# Patient Record
Sex: Male | Born: 1965 | Race: Black or African American | Hispanic: No | Marital: Married | Smoking: Never smoker
Health system: Southern US, Community
[De-identification: ages and names within clinical notes are randomized; demographics above are authoritative.]

## PROBLEM LIST (undated history)

## (undated) DIAGNOSIS — D649 Anemia, unspecified: Secondary | ICD-10-CM

## (undated) DIAGNOSIS — I10 Essential (primary) hypertension: Secondary | ICD-10-CM

## (undated) DIAGNOSIS — I509 Heart failure, unspecified: Secondary | ICD-10-CM

## (undated) DIAGNOSIS — N186 End stage renal disease: Secondary | ICD-10-CM

## (undated) DIAGNOSIS — J189 Pneumonia, unspecified organism: Secondary | ICD-10-CM

## (undated) DIAGNOSIS — E11319 Type 2 diabetes mellitus with unspecified diabetic retinopathy without macular edema: Secondary | ICD-10-CM

## (undated) DIAGNOSIS — L409 Psoriasis, unspecified: Secondary | ICD-10-CM

## (undated) DIAGNOSIS — Z992 Dependence on renal dialysis: Secondary | ICD-10-CM

## (undated) DIAGNOSIS — E109 Type 1 diabetes mellitus without complications: Secondary | ICD-10-CM

## (undated) HISTORY — DX: Heart failure, unspecified: I50.9

## (undated) HISTORY — PX: PORTA CATH INSERTION: CATH118285

## (undated) HISTORY — PX: EYE SURGERY: SHX253

## (undated) HISTORY — PX: PERITONEAL CATHETER INSERTION: SHX2223

## (undated) HISTORY — PX: CATARACT EXTRACTION W/ INTRAOCULAR LENS  IMPLANT, BILATERAL: SHX1307

## (undated) HISTORY — PX: RETINAL DETACHMENT SURGERY: SHX105

---

## 1998-04-26 ENCOUNTER — Inpatient Hospital Stay: Admission: AD | Admit: 1998-04-26 | Discharge: 1998-04-28 | Payer: Self-pay | Admitting: Emergency Medicine

## 1998-04-29 ENCOUNTER — Encounter: Admission: RE | Admit: 1998-04-29 | Discharge: 1998-04-29 | Payer: Self-pay | Admitting: Family Medicine

## 1998-05-06 ENCOUNTER — Encounter: Admission: RE | Admit: 1998-05-06 | Discharge: 1998-08-04 | Payer: Self-pay | Admitting: *Deleted

## 2001-09-12 ENCOUNTER — Inpatient Hospital Stay (HOSPITAL_COMMUNITY): Admission: EM | Admit: 2001-09-12 | Discharge: 2001-09-14 | Payer: Self-pay | Admitting: Emergency Medicine

## 2010-10-29 ENCOUNTER — Emergency Department (HOSPITAL_COMMUNITY)
Admission: EM | Admit: 2010-10-29 | Discharge: 2010-10-29 | Disposition: A | Discharge status: Home / Self Care | Payer: 59 | Attending: Emergency Medicine | Admitting: Emergency Medicine

## 2010-10-29 DIAGNOSIS — R5381 Other malaise: Secondary | ICD-10-CM | POA: Insufficient documentation

## 2010-10-29 DIAGNOSIS — Z794 Long term (current) use of insulin: Secondary | ICD-10-CM | POA: Insufficient documentation

## 2010-10-29 DIAGNOSIS — E1169 Type 2 diabetes mellitus with other specified complication: Secondary | ICD-10-CM | POA: Insufficient documentation

## 2010-10-29 LAB — GLUCOSE, CAPILLARY: Glucose-Capillary: 112 mg/dL — ABNORMAL HIGH (ref 70–99)

## 2010-10-30 LAB — GLUCOSE, CAPILLARY
Comment 1: 101
Glucose-Capillary: 179 mg/dL — ABNORMAL HIGH (ref 70–99)

## 2010-11-02 LAB — GLUCOSE, CAPILLARY
Comment 1: 1234
Glucose-Capillary: 41 mg/dL — CL (ref 70–99)

## 2011-04-22 ENCOUNTER — Emergency Department (HOSPITAL_COMMUNITY)
Admission: EM | Admit: 2011-04-22 | Discharge: 2011-04-22 | Disposition: A | Discharge status: Home / Self Care | Payer: 59 | Attending: Emergency Medicine | Admitting: Emergency Medicine

## 2011-04-22 DIAGNOSIS — T23209A Burn of second degree of unspecified hand, unspecified site, initial encounter: Secondary | ICD-10-CM | POA: Insufficient documentation

## 2011-04-22 DIAGNOSIS — T22219A Burn of second degree of unspecified forearm, initial encounter: Secondary | ICD-10-CM | POA: Insufficient documentation

## 2011-04-22 DIAGNOSIS — Z79899 Other long term (current) drug therapy: Secondary | ICD-10-CM | POA: Insufficient documentation

## 2011-04-22 DIAGNOSIS — T310 Burns involving less than 10% of body surface: Secondary | ICD-10-CM | POA: Insufficient documentation

## 2011-04-22 DIAGNOSIS — Y93G3 Activity, cooking and baking: Secondary | ICD-10-CM | POA: Insufficient documentation

## 2011-04-22 DIAGNOSIS — E119 Type 2 diabetes mellitus without complications: Secondary | ICD-10-CM | POA: Insufficient documentation

## 2011-04-22 DIAGNOSIS — X131XXA Other contact with steam and other hot vapors, initial encounter: Secondary | ICD-10-CM | POA: Insufficient documentation

## 2011-04-22 DIAGNOSIS — X12XXXA Contact with other hot fluids, initial encounter: Secondary | ICD-10-CM | POA: Insufficient documentation

## 2011-04-24 ENCOUNTER — Inpatient Hospital Stay (INDEPENDENT_AMBULATORY_CARE_PROVIDER_SITE_OTHER)
Admission: RE | Admit: 2011-04-24 | Discharge: 2011-04-24 | Disposition: A | Discharge status: Home / Self Care | Payer: 59 | Source: Ambulatory Visit | Attending: Emergency Medicine | Admitting: Emergency Medicine

## 2011-04-24 DIAGNOSIS — T23009A Burn of unspecified degree of unspecified hand, unspecified site, initial encounter: Secondary | ICD-10-CM

## 2011-04-24 DIAGNOSIS — T22019A Burn of unspecified degree of unspecified forearm, initial encounter: Secondary | ICD-10-CM

## 2012-12-29 ENCOUNTER — Emergency Department (HOSPITAL_COMMUNITY): Payer: 59

## 2012-12-29 ENCOUNTER — Encounter (HOSPITAL_COMMUNITY): Payer: Self-pay | Admitting: Emergency Medicine

## 2012-12-29 ENCOUNTER — Inpatient Hospital Stay (HOSPITAL_COMMUNITY): Payer: 59

## 2012-12-29 ENCOUNTER — Inpatient Hospital Stay (HOSPITAL_COMMUNITY)
Admission: EM | Admit: 2012-12-29 | Discharge: 2013-01-01 | DRG: 292 | Disposition: A | Discharge status: Home / Self Care | Payer: 59 | Attending: Internal Medicine | Admitting: Internal Medicine

## 2012-12-29 DIAGNOSIS — N189 Chronic kidney disease, unspecified: Secondary | ICD-10-CM

## 2012-12-29 DIAGNOSIS — E876 Hypokalemia: Secondary | ICD-10-CM | POA: Diagnosis present

## 2012-12-29 DIAGNOSIS — I5031 Acute diastolic (congestive) heart failure: Principal | ICD-10-CM | POA: Diagnosis present

## 2012-12-29 DIAGNOSIS — I129 Hypertensive chronic kidney disease with stage 1 through stage 4 chronic kidney disease, or unspecified chronic kidney disease: Secondary | ICD-10-CM | POA: Diagnosis present

## 2012-12-29 DIAGNOSIS — I5021 Acute systolic (congestive) heart failure: Secondary | ICD-10-CM

## 2012-12-29 DIAGNOSIS — E119 Type 2 diabetes mellitus without complications: Secondary | ICD-10-CM | POA: Diagnosis present

## 2012-12-29 DIAGNOSIS — N179 Acute kidney failure, unspecified: Secondary | ICD-10-CM

## 2012-12-29 DIAGNOSIS — I5030 Unspecified diastolic (congestive) heart failure: Secondary | ICD-10-CM

## 2012-12-29 DIAGNOSIS — N183 Chronic kidney disease, stage 3 unspecified: Secondary | ICD-10-CM | POA: Diagnosis present

## 2012-12-29 DIAGNOSIS — D649 Anemia, unspecified: Secondary | ICD-10-CM

## 2012-12-29 DIAGNOSIS — L408 Other psoriasis: Secondary | ICD-10-CM | POA: Diagnosis present

## 2012-12-29 DIAGNOSIS — I509 Heart failure, unspecified: Secondary | ICD-10-CM | POA: Diagnosis present

## 2012-12-29 DIAGNOSIS — I10 Essential (primary) hypertension: Secondary | ICD-10-CM

## 2012-12-29 DIAGNOSIS — E875 Hyperkalemia: Secondary | ICD-10-CM

## 2012-12-29 HISTORY — DX: Psoriasis, unspecified: L40.9

## 2012-12-29 HISTORY — DX: Essential (primary) hypertension: I10

## 2012-12-29 LAB — COMPREHENSIVE METABOLIC PANEL
ALT: 27 U/L (ref 0–53)
CO2: 22 mEq/L (ref 19–32)
Calcium: 8.5 mg/dL (ref 8.4–10.5)
Creatinine, Ser: 2.74 mg/dL — ABNORMAL HIGH (ref 0.50–1.35)
GFR calc Af Amer: 30 mL/min — ABNORMAL LOW (ref >90)
GFR calc non Af Amer: 26 mL/min — ABNORMAL LOW (ref >90)
Glucose, Bld: 147 mg/dL — ABNORMAL HIGH (ref 70–99)
Sodium: 136 mEq/L (ref 135–145)

## 2012-12-29 LAB — CBC WITH DIFFERENTIAL/PLATELET
Eosinophils Relative: 4 % (ref 0–5)
HCT: 23.7 % — ABNORMAL LOW (ref 39.0–52.0)
Lymphocytes Relative: 18 % (ref 12–46)
Lymphs Abs: 1.4 10*3/uL (ref 0.7–4.0)
MCV: 82.6 fL (ref 78.0–100.0)
Monocytes Absolute: 0.9 10*3/uL (ref 0.1–1.0)
Monocytes Relative: 12 % (ref 3–12)
RBC: 2.87 MIL/uL — ABNORMAL LOW (ref 4.22–5.81)
WBC: 7.4 10*3/uL (ref 4.0–10.5)

## 2012-12-29 LAB — POCT I-STAT TROPONIN I

## 2012-12-29 LAB — GLUCOSE, CAPILLARY: Glucose-Capillary: 139 mg/dL — ABNORMAL HIGH (ref 70–99)

## 2012-12-29 MED ORDER — DOCUSATE SODIUM 100 MG PO CAPS
100.0000 mg | ORAL_CAPSULE | Freq: Two times a day (BID) | ORAL | Status: DC | PRN
  Filled 2012-12-29: qty 1

## 2012-12-29 MED ORDER — SODIUM CHLORIDE 0.9 % IJ SOLN
3.0000 mL | Freq: Two times a day (BID) | INTRAMUSCULAR | Status: DC
  Administered 2012-12-30 – 2013-01-01 (×5): 3 mL via INTRAVENOUS

## 2012-12-29 MED ORDER — ONDANSETRON HCL 4 MG/2ML IJ SOLN: 4.0000 mg | Freq: Three times a day (TID) | INTRAMUSCULAR | Status: AC | PRN

## 2012-12-29 MED ORDER — ACETAMINOPHEN 325 MG PO TABS: 650.0000 mg | ORAL_TABLET | Freq: Four times a day (QID) | ORAL | Status: DC | PRN

## 2012-12-29 MED ORDER — BRIMONIDINE TARTRATE 0.15 % OP SOLN
1.0000 [drp] | Freq: Two times a day (BID) | OPHTHALMIC | Status: DC
  Administered 2012-12-29 – 2013-01-01 (×6): 1 [drp] via OPHTHALMIC
  Filled 2012-12-29: qty 5

## 2012-12-29 MED ORDER — TIMOLOL MALEATE 0.5 % OP SOLN
1.0000 [drp] | Freq: Two times a day (BID) | OPHTHALMIC | Status: DC
  Administered 2012-12-29 – 2013-01-01 (×6): 1 [drp] via OPHTHALMIC
  Filled 2012-12-29: qty 5

## 2012-12-29 MED ORDER — ACETAMINOPHEN 650 MG RE SUPP: 650.0000 mg | Freq: Four times a day (QID) | RECTAL | Status: DC | PRN

## 2012-12-29 MED ORDER — ASPIRIN EC 81 MG PO TBEC
81.0000 mg | DELAYED_RELEASE_TABLET | Freq: Every day | ORAL | Status: DC
  Administered 2012-12-30 – 2013-01-01 (×3): 81 mg via ORAL
  Filled 2012-12-29 (×3): qty 1

## 2012-12-29 MED ORDER — SODIUM CHLORIDE 0.9 % IV SOLN: INTRAVENOUS | Status: AC

## 2012-12-29 MED ORDER — KETOROLAC TROMETHAMINE 0.5 % OP SOLN
1.0000 [drp] | Freq: Every day | OPHTHALMIC | Status: DC
  Administered 2012-12-30 – 2013-01-01 (×3): 1 [drp] via OPHTHALMIC
  Filled 2012-12-29: qty 3

## 2012-12-29 MED ORDER — PNEUMOCOCCAL VAC POLYVALENT 25 MCG/0.5ML IJ INJ
0.5000 mL | INJECTION | INTRAMUSCULAR | Status: AC
  Administered 2012-12-30: 0.5 mL via INTRAMUSCULAR
  Filled 2012-12-29 (×2): qty 0.5

## 2012-12-29 MED ORDER — INSULIN ASPART 100 UNIT/ML ~~LOC~~ SOLN
0.0000 [IU] | Freq: Three times a day (TID) | SUBCUTANEOUS | Status: DC
  Administered 2012-12-31: 1 [IU] via SUBCUTANEOUS
  Administered 2012-12-31: 2 [IU] via SUBCUTANEOUS

## 2012-12-29 MED ORDER — SODIUM CHLORIDE 0.9 % IV SOLN
1.0000 g | Freq: Once | INTRAVENOUS | Status: AC
  Administered 2012-12-29: 1 g via INTRAVENOUS
  Filled 2012-12-29: qty 10

## 2012-12-29 MED ORDER — FUROSEMIDE 10 MG/ML IJ SOLN
40.0000 mg | Freq: Two times a day (BID) | INTRAMUSCULAR | Status: DC
  Administered 2012-12-30 – 2012-12-31 (×3): 40 mg via INTRAVENOUS
  Filled 2012-12-29 (×5): qty 4

## 2012-12-29 MED ORDER — ONDANSETRON HCL 4 MG/2ML IJ SOLN: 4.0000 mg | Freq: Four times a day (QID) | INTRAMUSCULAR | Status: DC | PRN

## 2012-12-29 MED ORDER — FUROSEMIDE 10 MG/ML IJ SOLN
40.0000 mg | Freq: Once | INTRAMUSCULAR | Status: AC
  Administered 2012-12-29: 40 mg via INTRAVENOUS
  Filled 2012-12-29: qty 4

## 2012-12-29 MED ORDER — INSULIN ASPART 100 UNIT/ML ~~LOC~~ SOLN: 0.0000 [IU] | Freq: Every day | SUBCUTANEOUS | Status: DC

## 2012-12-29 MED ORDER — ONDANSETRON HCL 4 MG PO TABS: 4.0000 mg | ORAL_TABLET | Freq: Four times a day (QID) | ORAL | Status: DC | PRN

## 2012-12-29 MED ORDER — INSULIN ASPART PROT & ASPART (70-30 MIX) 100 UNIT/ML ~~LOC~~ SUSP
12.0000 [IU] | Freq: Two times a day (BID) | SUBCUTANEOUS | Status: DC
  Administered 2012-12-30: 12 [IU] via SUBCUTANEOUS
  Filled 2012-12-29: qty 10

## 2012-12-29 MED ORDER — SODIUM CHLORIDE 0.9 % IV SOLN: 250.0000 mL | INTRAVENOUS | Status: DC | PRN

## 2012-12-29 MED ORDER — HYDRALAZINE HCL 20 MG/ML IJ SOLN
5.0000 mg | Freq: Three times a day (TID) | INTRAMUSCULAR | Status: DC | PRN
  Administered 2012-12-31: 5 mg via INTRAVENOUS
  Filled 2012-12-29: qty 1

## 2012-12-29 MED ORDER — ENOXAPARIN SODIUM 40 MG/0.4ML ~~LOC~~ SOLN
40.0000 mg | Freq: Every day | SUBCUTANEOUS | Status: DC
  Administered 2012-12-29 – 2012-12-30 (×2): 40 mg via SUBCUTANEOUS
  Filled 2012-12-29 (×3): qty 0.4

## 2012-12-29 MED ORDER — TRAVOPROST (BAK FREE) 0.004 % OP SOLN
1.0000 [drp] | Freq: Every day | OPHTHALMIC | Status: DC
  Administered 2012-12-29 – 2012-12-31 (×3): 1 [drp] via OPHTHALMIC
  Filled 2012-12-29: qty 2.5

## 2012-12-29 MED ORDER — SODIUM CHLORIDE 0.9 % IJ SOLN: 3.0000 mL | INTRAMUSCULAR | Status: DC | PRN

## 2012-12-29 MED ORDER — SODIUM POLYSTYRENE SULFONATE 15 GM/60ML PO SUSP
30.0000 g | Freq: Once | ORAL | Status: AC
  Administered 2012-12-29: 30 g via ORAL
  Filled 2012-12-29: qty 120

## 2012-12-29 MED ORDER — SODIUM CHLORIDE 0.9 % IJ SOLN
3.0000 mL | Freq: Two times a day (BID) | INTRAMUSCULAR | Status: DC
  Administered 2012-12-30 – 2012-12-31 (×2): 3 mL via INTRAVENOUS

## 2012-12-29 NOTE — ED Notes (Signed)
US renal in progress.

## 2012-12-29 NOTE — ED Notes (Signed)
Patient transported to X-ray 

## 2012-12-29 NOTE — ED Notes (Signed)
Pt c/o bilateral leg swelling and SOB while lying down x2 weeks.  Reports 6/10 pain when walking.  Pt was referred here by [facility] to r/o CHF.  Provider reports hearing a new murmur on exam.

## 2012-12-29 NOTE — H&P (Addendum)
[facility] History and Physical  Bryan Wilkerson [id number] DOB: Nov 27, 1965 DOA: 12/29/2012  Referring physician: Dr Eulis Foster PCP: Jacelyn Pi, MD  Specialists: DR Chalmers Cater  Chief Complaint: shortness of breath  And pedal edema since 2 weeks  HPI: Bryan Wilkerson is a 47 y.o. male with h/o Hypertension, DM, psoriasis, came in for worsening sob on exertion, and pedal edema for 2 weeks. Sob not associated with chest pain, or dizziness . No fever or chills. No pnd, or orthopnea. No nausea , vomiting or abd pain. He went to urgent care and was referred to ED for admission for possible CHF. On arrival to ED, his probnp was found to be elevated, CXR showed pulmonary vascular congestion, and in acute on CKD, and hyperkalemia. He was referred to medical service for management of acute CHF.    Review of Systems: The patient denies anorexia, fever, weight loss,, vision loss, decreased hearing, hoarseness, chest pain, syncope,  balance deficits, hemoptysis, abdominal pain, melena, hematochezia, severe indigestion/heartburn, hematuria, incontinence, genital sores, muscle weakness, suspicious skin lesions, transient blindness, difficulty walking, depression, unusual weight change, abnormal bleeding, enlarged lymph nodes, angioedema, and breast masses.    Past Medical History  Diagnosis Date  . Hypertension   . Diabetes mellitus without complication   . Psoriasis    Past Surgical History  Procedure Laterality Date  . Cataract extraction w/ intraocular lens  implant, bilateral     Social History:  reports that he has never smoked. He does not have any smokeless tobacco history on file. He reports that he does not drink alcohol or use illicit drugs.  where does patient live--home,   Allergies  Allergen Reactions  . Penicillins     childhood    History reviewed. No pertinent family history.   Prior to Admission medications  Medication Sig Start Date End Date Taking? Authorizing Provider   adalimumab (HUMIRA) 40 MG/0.8ML injection Inject 40 mg into the skin every 14 (fourteen) days.   Yes Historical Provider, MD  amLODipine (NORVASC) 2.5 MG tablet Take 2.5 mg by mouth daily.   Yes Historical Provider, MD  brimonidine (ALPHAGAN) 0.15 % ophthalmic solution Place 1 drop into both eyes 2 (two) times daily.   Yes Historical Provider, MD  Bromfenac Sodium 0.07 % SOLN Apply 1 drop to eye daily. In left eye   Yes Historical Provider, MD  insulin aspart protamine- aspart (NOVOLOG MIX 70/30) (70-30) 100 UNIT/ML injection Inject 12 Units into the skin 2 (two) times daily.   Yes Historical Provider, MD  losartan (COZAAR) 25 MG tablet Take 25 mg by mouth daily.   Yes Historical Provider, MD  timolol (TIMOPTIC) 0.5 % ophthalmic solution Place 1 drop into both eyes 2 (two) times daily.   Yes Historical Provider, MD  Travoprost, BAK Free, (TRAVATAN) 0.004 % SOLN ophthalmic solution Place 1 drop into both eyes at bedtime.   Yes Historical Provider, MD   Physical Exam: Filed Vitals:   12/29/12 1318 12/29/12 1624  BP: 169/94   Pulse: 84   Temp: 98.6 F (37 C)   TempSrc: Oral   Resp: 18   Weight:  98.385 kg (216 lb 14.4 oz)  SpO2: 94%     Constitutional: Vital signs reviewed.  Patient is a well-developed and well-nourished  in no acute distress and cooperative with exam. Alert and oriented x3.  Head: Normocephalic and atraumatic Mouth: no erythema or exudates, MMM Eyes: PERRL, EOMI, conjunctivae normal, No scleral icterus.  Neck: Supple, Trachea midline normal ROM, No JVD, mass, thyromegaly,  or carotid bruit present.  Cardiovascular: RRR, S1 normal, S2 normal, no MRG, pulses symmetric and intact bilaterally Pulmonary/Chest: scattered rales at bases, no wheezing or rhonchi.  Abdominal: Soft. Non-tender, non-distended, bowel sounds are normal, no masses, organomegaly, or guarding present.   Musculoskeletal: 2+ leg edema. .  Neurological: A&O x3, Strength is normal and symmetric  bilaterally, no facial asymmetry and speech normal. no focal motor deficit, sensory intact to light touch bilaterally.  Skin: diffuse psoriatic rash.  Psychiatric: Normal mood and affect. speech and behavior is normal. Judgment and thought content normal. Cognition and memory are normal.    Labs on Admission:  Basic Metabolic Panel:  Recent Labs Lab 12/29/12 1431  NA 136  K 5.4*  CL 107  CO2 22  GLUCOSE 147*  BUN 38*  CREATININE 2.74*  CALCIUM 8.5   Liver Function Tests:  Recent Labs Lab 12/29/12 1431  AST 25  ALT 27  ALKPHOS 219*  BILITOT 0.2*  PROT 8.3  ALBUMIN 2.5*   No results found for this basename: LIPASE, AMYLASE,  in the last 168 hours No results found for this basename: AMMONIA,  in the last 168 hours CBC:  Recent Labs Lab 12/29/12 1431  WBC 7.4  NEUTROABS 4.9  HGB 7.5*  HCT 23.7*  MCV 82.6  PLT 339   Cardiac Enzymes: No results found for this basename: CKTOTAL, CKMB, CKMBINDEX, TROPONINI,  in the last 168 hours  BNP (last 3 results)  Recent Labs  12/29/12 1431  PROBNP 3541.0*   CBG: No results found for this basename: GLUCAP,  in the last 168 hours  Radiological Exams on Admission: Dg Chest 2 View  12/29/2012   *RADIOLOGY REPORT*  Clinical Data: Cough, shortness of breath, hypertension, diabetes  CHEST - 2 VIEW  Comparison: None  Findings: Enlargement of cardiac silhouette with pulmonary vascular congestion. Peribronchial thickening. Mild perihilar infiltrates could represent infection or edema. No pleural effusion or pneumothorax. Bones unremarkable.  IMPRESSION: Enlargement of cardiac silhouette with pulmonary vascular congestion. Peribronchial thickening with perihilar infiltrates which could represent pulmonary edema/CHF or infection.   Original Report Authenticated By: Lavonia Dana, M.D.    EKG: NOT DONE. Ordered.   Assessment/Plan Active Problems:      1. Worsening sob on exertion/ pedal edema/ elevated probnp : possibly a  combination of CHF exacerbation vs severe anemia.  - admit to telemetry,  - start pt on IV lasix BID,  - echo cardiogram ordered. - daily weight's and I/O's - serial cardiac markers ordered.  - EKG still pending.  - lipid panel pending.  - he is comfortable on RA with good oxygen sats.    2. Acute on CKD: Pt is unaware of his baseline creatinine, but reports he has renal insufficiency. Will get US renal to evaluate for hydronephrosis. Will get urine electrolytes, its possibly from fluid overload. Started him on lasix, repeat renal parameters in am.   3. Severe anemia: no h/o sickle cell or thalassemia. No hematochezia. Anemia panel ordered. Stool for occult blood ordered. Anemia is normocytic. Will order 1 unit of prbc transfusion for tonight.   4. Hyperkalemia: on losartan at home , which was held for hyperkalemia and acute on CKD. Kayexalate given in ED, Repeat K in am.   5. Diabetes mellitus:  - hgba1c, resume home insulin and continue with SSI.   6. DVT / prophylaxis.   7. Hypertension: suboptimal. Losartan held, will use hydralazine IV as needed.   8. Psoriasis on Humira at home.  Code Status: full code Family Communication: NONE at bedside Disposition Plan: pending further evaluation.  Time spent: 85 minutes.   [facility] Pager [phone]  If 7PM-7AM, please contact night-coverage [email or web address] Password [facility] 12/29/2012, 8:41 PM

## 2012-12-29 NOTE — ED Provider Notes (Signed)
History    CSN: [id number] Arrival date & time 12/29/12  1237  First MD Initiated Contact with Patient 12/29/12 1446     Chief Complaint  Patient presents with  . Leg Swelling   (Consider location/radiation/quality/duration/timing/severity/associated sxs/prior Treatment) HPI Comments: Bryan Wilkerson is a 47 y.o. male who presents for evaluation of orthopnea and wheezing. His nocturnal without the that improves with sitting up. He denies chest pain, weakness, dizziness, nausea, vomiting. He has noticed leg swelling and shortness of breath for 2 weeks. No similar problem in the past. No change in his medications, recently. He was seen by his PCP about a month ago, at which time, he weighed 195 pounds. He denies headache paresthesias, back, pain, leg pain or gait disturbance. There are no known modifying factors.  The history is provided by the patient.   Past Medical History  Diagnosis Date  . Hypertension   . Diabetes mellitus without complication   . Psoriasis    Past Surgical History  Procedure Laterality Date  . Cataract extraction w/ intraocular lens  implant, bilateral     History reviewed. No pertinent family history. History  Substance Use Topics  . Smoking status: Never Smoker   . Smokeless tobacco: Not on file  . Alcohol Use: No    Review of Systems  All other systems reviewed and are negative.    Allergies  Penicillins  Home Medications   No current outpatient prescriptions on file. BP 156/77  Pulse 85  Temp(Src) 98.7 F (37.1 C) (Oral)  Resp 18  Ht 6\' 3"  (1.905 m)  Wt 210 lb 1.6 oz (95.3 kg)  BMI 26.26 kg/m2  SpO2 100% Physical Exam  Nursing note and vitals reviewed. Constitutional: He is oriented to person, place, and time. He appears well-developed and well-nourished.  HENT:  Head: Normocephalic and atraumatic.  Right Ear: External ear normal.  Left Ear: External ear normal.  Eyes: Conjunctivae and EOM are normal. Pupils are equal, round, and  reactive to light.  Neck: Normal range of motion and phonation normal. Neck supple.  Cardiovascular: Normal rate, regular rhythm, normal heart sounds and intact distal pulses.  Exam reveals no gallop and no friction rub.   No murmur heard. No audible murmur  Pulmonary/Chest: Effort normal and breath sounds normal. He exhibits no bony tenderness.  Abdominal: Soft. Normal appearance. There is no tenderness.  Musculoskeletal: Normal range of motion.  2+ edema bilateral lower extremities; no associated redness, or drainage  Neurological: He is alert and oriented to person, place, and time. He has normal strength. No cranial nerve deficit or sensory deficit. He exhibits normal muscle tone. Coordination normal.  Skin: Skin is warm, dry and intact.  Diffuse rash consistent with psoriasis, without active weepiness or redness  Psychiatric: He has a normal mood and affect. His behavior is normal. Judgment and thought content normal.    ED Course  Procedures (including critical care time) Medications  ondansetron (ZOFRAN) injection 4 mg (not administered)  brimonidine (ALPHAGAN) 0.15 % ophthalmic solution 1 drop (1 drop Both Eyes Given by Other 12/30/12 1000)  ketorolac (ACULAR) 0.5 % ophthalmic solution 1 drop (1 drop Left Eye Given 12/30/12 1100)  insulin aspart protamine- aspart (NOVOLOG MIX 70/30) injection 12 Units (12 Units Subcutaneous Not Given 12/30/12 1630)  timolol (TIMOPTIC) 0.5 % ophthalmic solution 1 drop (1 drop Both Eyes Given 12/30/12 1100)  Travoprost (BAK Free) (TRAVATAN) 0.004 % ophthalmic solution SOLN 1 drop (1 drop Both Eyes Given 12/29/12 2355)  enoxaparin (LOVENOX)  injection 40 mg (40 mg Subcutaneous Given 12/29/12 2355)  sodium chloride 0.9 % injection 3 mL (0 mLs Intravenous Duplicate [id number])  acetaminophen (TYLENOL) tablet 650 mg (not administered)    Or  acetaminophen (TYLENOL) suppository 650 mg (not administered)  docusate sodium (COLACE) capsule 100 mg (not  administered)  ondansetron (ZOFRAN) tablet 4 mg (not administered)    Or  ondansetron (ZOFRAN) injection 4 mg (not administered)  furosemide (LASIX) injection 40 mg (40 mg Intravenous Given 12/30/12 1609)  insulin aspart (novoLOG) injection 0-9 Units ( Subcutaneous Not Given 12/30/12 1629)  insulin aspart (novoLOG) injection 0-5 Units (0 Units Subcutaneous Not Given 12/29/12 2251)  hydrALAZINE (APRESOLINE) injection 5 mg (not administered)  sodium chloride 0.9 % injection 3 mL (3 mLs Intravenous Given 12/30/12 1123)  sodium chloride 0.9 % injection 3 mL (not administered)  0.9 %  sodium chloride infusion (not administered)  aspirin EC tablet 81 mg (81 mg Oral Given 12/30/12 1109)  furosemide (LASIX) injection 40 mg (40 mg Intravenous Given 12/29/12 1710)  sodium polystyrene (KAYEXALATE) 15 GM/60ML suspension 30 g (30 g Oral Given 12/29/12 1712)  0.9 %  sodium chloride infusion ( Intravenous Duplicate [id number])  pneumococcal 23 valent vaccine (PNU-IMMUNE) injection 0.5 mL (0.5 mLs Intramuscular Given 12/30/12 1221)  calcium gluconate 1 g in sodium chloride 0.9 % 100 mL IVPB (1 g Intravenous Given 12/29/12 2357)  dextrose 50 % solution 50 mL (50 mLs Intravenous Given 12/30/12 1211)   Patient Vitals for the past 24 hrs:  BP Temp Temp src Pulse Resp SpO2 Height Weight  12/30/12 1500 156/77 mmHg 98.7 F (37.1 C) Oral 85 18 100 % - -  12/30/12 0549 153/84 mmHg 97.3 F (36.3 C) Oral 84 19 96 % - 210 lb 1.6 oz (95.3 kg)  12/29/12 2150 162/85 mmHg 97.9 F (36.6 C) Oral 91 19 99 % 6\' 3"  (1.905 m) 210 lb 1.6 oz (95.3 kg)   5:55 PM-Consult complete with Hospitalist physician. Patient case explained and discussed. She agrees to admit patient for further evaluation and treatment. Call ended at [place]   Date: 12/29/12  Rate: 84  Rhythm: normal sinus rhythm  QRS Axis: normal  PR and QT Intervals: normal  ST/T Wave abnormalities: normal  PR and QRS Conduction Disutrbances:none  Narrative Interpretation:    Old EKG Reviewed: none available    Labs Reviewed  CBC WITH DIFFERENTIAL - Abnormal; Notable for the following:    RBC 2.87 (*)    Hemoglobin 7.5 (*)    HCT 23.7 (*)    All other components within normal limits  COMPREHENSIVE METABOLIC PANEL - Abnormal; Notable for the following:    Potassium 5.4 (*)    Glucose, Bld 147 (*)    BUN 38 (*)    Creatinine, Ser 2.74 (*)    Albumin 2.5 (*)    Alkaline Phosphatase 219 (*)    Total Bilirubin 0.2 (*)    GFR calc non Af Amer 26 (*)    GFR calc Af Amer 30 (*)    All other components within normal limits  PRO B NATRIURETIC PEPTIDE - Abnormal; Notable for the following:    Pro B Natriuretic peptide (BNP) 3541.0 (*)    All other components within normal limits  IRON AND TIBC - Abnormal; Notable for the following:    Iron 18 (*)    Saturation Ratios 8 (*)    All other components within normal limits  RETICULOCYTES - Abnormal; Notable for the following:  RBC. 2.65 (*)    All other components within normal limits  HEMOGLOBIN A1C - Abnormal; Notable for the following:    Hemoglobin A1C 6.5 (*)    Mean Plasma Glucose 140 (*)    All other components within normal limits  GLUCOSE, CAPILLARY - Abnormal; Notable for the following:    Glucose-Capillary 139 (*)    All other components within normal limits  URINALYSIS, ROUTINE W REFLEX MICROSCOPIC - Abnormal; Notable for the following:    Hgb urine dipstick SMALL (*)    Protein, ur 100 (*)    All other components within normal limits  BASIC METABOLIC PANEL - Abnormal; Notable for the following:    Glucose, Bld 132 (*)    BUN 34 (*)    Creatinine, Ser 2.71 (*)    Calcium 8.2 (*)    GFR calc non Af Amer 27 (*)    GFR calc Af Amer 31 (*)    All other components within normal limits  CBC - Abnormal; Notable for the following:    RBC 2.65 (*)    Hemoglobin 7.0 (*)    HCT 21.8 (*)    All other components within normal limits  GLUCOSE, CAPILLARY - Abnormal; Notable for the following:     Glucose-Capillary 102 (*)    All other components within normal limits  GLUCOSE, CAPILLARY - Abnormal; Notable for the following:    Glucose-Capillary 140 (*)    All other components within normal limits  GLUCOSE, CAPILLARY - Abnormal; Notable for the following:    Glucose-Capillary 33 (*)    All other components within normal limits  GLUCOSE, CAPILLARY - Abnormal; Notable for the following:    Glucose-Capillary 33 (*)    All other components within normal limits  GLUCOSE, CAPILLARY - Abnormal; Notable for the following:    Glucose-Capillary 107 (*)    All other components within normal limits  VITAMIN B12  FOLATE  FERRITIN  TSH  TROPONIN I  TROPONIN I  TROPONIN I  LIPID PANEL  PROTIME-INR  URINE MICROSCOPIC-ADD ON  SODIUM, URINE, RANDOM  CREATININE, URINE, RANDOM  OCCULT BLOOD X 1 CARD TO LAB, STOOL  POCT I-STAT TROPONIN I  TYPE AND SCREEN  PREPARE RBC (CROSSMATCH)  ABO/RH   Dg Chest 2 View  12/29/2012   *RADIOLOGY REPORT*  Clinical Data: Cough, shortness of breath, hypertension, diabetes  CHEST - 2 VIEW  Comparison: None  Findings: Enlargement of cardiac silhouette with pulmonary vascular congestion. Peribronchial thickening. Mild perihilar infiltrates could represent infection or edema. No pleural effusion or pneumothorax. Bones unremarkable.  IMPRESSION: Enlargement of cardiac silhouette with pulmonary vascular congestion. Peribronchial thickening with perihilar infiltrates which could represent pulmonary edema/CHF or infection.   Original Report Authenticated By: Lavonia Dana, M.D.   US Renal  12/29/2012   *RADIOLOGY REPORT*  Clinical Data: Acute on chronic renal failure.  Elevated creatinine.  RENAL/URINARY TRACT ULTRASOUND COMPLETE  Comparison:  None.  Findings:  Right Kidney:  13.0 cm. No hydronephrosis. Normal renal cortical thickness.  Question mild increase in echogenicity.  Left Kidney:  12.8 cm. No hydronephrosis.  Normal renal cortical thickness.  There may be mild  increased echogenicity.  Bladder:  Within normal limits.  IMPRESSION:  1. No hydronephrosis. 2.  Mildly increased echogenicity is suspected.  This could represent medical renal disease.   Original Report Authenticated By: Abigail Miyamoto, M.D.   1. Congestive heart failure, acute, systolic   2. Anemia   3. Acute kidney failure   4. Hyperkalemia  5. Accelerated hypertension   6. Acute on chronic renal failure     MDM  Symptoms and chest x-ray, consistent with congestive heart failure. Anemia of nonspecific etiology, is likely a contributor. The patient's weight today is about 20 pounds higher than his recollection of the weight, 1 month ago. BNP elevated. He also has hyperkalemia, and renal insufficiency. There are no comparison. Labs available, and his physician's office, is not open to obtain records. The patient's unstable and requires admission for further treatment and evaluation.   Nursing Notes Reviewed/ Care Coordinated, and agree without changes. Applicable Imaging Reviewed.  Interpretation of Laboratory Data incorporated into ED treatment   Plan: Admit  Richarda Blade, MD 12/30/12 1712

## 2012-12-29 NOTE — ED Notes (Signed)
Called to give report, receiving rn is currently in an isolation room, will return phone ASAP.

## 2012-12-30 DIAGNOSIS — I059 Rheumatic mitral valve disease, unspecified: Secondary | ICD-10-CM

## 2012-12-30 LAB — URINALYSIS, ROUTINE W REFLEX MICROSCOPIC
Bilirubin Urine: NEGATIVE
Leukocytes, UA: NEGATIVE
Nitrite: NEGATIVE
Specific Gravity, Urine: 1.01 (ref 1.005–1.030)
Urobilinogen, UA: 1 mg/dL (ref 0.0–1.0)
pH: 6.5 (ref 5.0–8.0)

## 2012-12-30 LAB — CBC
Hemoglobin: 7 g/dL — ABNORMAL LOW (ref 13.0–17.0)
Platelets: 262 10*3/uL (ref 150–400)
RBC: 2.65 MIL/uL — ABNORMAL LOW (ref 4.22–5.81)
WBC: 6 10*3/uL (ref 4.0–10.5)

## 2012-12-30 LAB — TROPONIN I: Troponin I: <0.3 ng/mL (ref <0.30)

## 2012-12-30 LAB — BASIC METABOLIC PANEL
CO2: 25 mEq/L (ref 19–32)
Calcium: 8.2 mg/dL — ABNORMAL LOW (ref 8.4–10.5)
GFR calc non Af Amer: 27 mL/min — ABNORMAL LOW (ref >90)
Potassium: 4.8 mEq/L (ref 3.5–5.1)
Sodium: 138 mEq/L (ref 135–145)

## 2012-12-30 LAB — FERRITIN: Ferritin: 66 ng/mL (ref 22–322)

## 2012-12-30 LAB — LIPID PANEL
HDL: 53 mg/dL (ref >39)
LDL Cholesterol: 78 mg/dL (ref 0–99)
Total CHOL/HDL Ratio: 2.8 RATIO

## 2012-12-30 LAB — TSH: TSH: 1.582 u[IU]/mL (ref 0.350–4.500)

## 2012-12-30 LAB — IRON AND TIBC: Iron: 18 ug/dL — ABNORMAL LOW (ref 42–135)

## 2012-12-30 LAB — GLUCOSE, CAPILLARY
Glucose-Capillary: 33 mg/dL — CL (ref 70–99)
Glucose-Capillary: 107 mg/dL — ABNORMAL HIGH (ref 70–99)

## 2012-12-30 LAB — RETICULOCYTES
RBC.: 2.65 MIL/uL — ABNORMAL LOW (ref 4.22–5.81)
Retic Count, Absolute: 39.8 10*3/uL (ref 19.0–186.0)
Retic Ct Pct: 1.5 % (ref 0.4–3.1)

## 2012-12-30 LAB — PREPARE RBC (CROSSMATCH)

## 2012-12-30 LAB — SODIUM, URINE, RANDOM: Sodium, Ur: 132 mEq/L

## 2012-12-30 MED ORDER — INSULIN ASPART PROT & ASPART (70-30 MIX) 100 UNIT/ML ~~LOC~~ SUSP
10.0000 [IU] | Freq: Two times a day (BID) | SUBCUTANEOUS | Status: DC
  Administered 2012-12-31: 10 [IU] via SUBCUTANEOUS
  Filled 2012-12-30: qty 10

## 2012-12-30 MED ORDER — DEXTROSE 50 % IV SOLN
50.0000 mL | Freq: Once | INTRAVENOUS | Status: AC | PRN
  Administered 2012-12-30: 50 mL via INTRAVENOUS

## 2012-12-30 NOTE — Progress Notes (Signed)
Patient ID: Bryan Wilkerson, male   DOB: Nov 14, 1965, 47 y.o.   MRN: [id number] [facility] PROGRESS NOTE  Bryan Wilkerson [id number] DOB: 1965-10-26 DOA: 12/29/2012 PCP: Bryan Pi, MD  Brief narrative: 47 y.o. male with h/o Hypertension, DM, psoriasis, came in for worsening sob on exertion, and pedal edema for 2 weeks. Sob not associated with chest pain, or dizziness . No fever or chills. No pnd, or orthopnea. No nausea , vomiting or abd pain. He went to urgent care and was referred to ED for admission for possible CHF. On arrival to ED, his probnp was found to be elevated, CXR showed pulmonary vascular congestion, and in acute on CKD, hyperkalemia. He was referred to medical service for management of acute CHF.   Active Problems:   CHF (congestive heart failure), NYHA class II - likely diastolic as systolic function appears to be normal - will obtain UDS as it was not obtained on admission - will continue Lasix IV for now, strict I's and O's - daily weights monioring   Acute on chronic renal failure, stage II - III - unclear etiology, renal US with chronic medical renal disease - monitor renal function closely  - FENa > 8, pt will need referral to nephrology upon discharge - avoid nephrotoxins - Lasix necessary to ensure symptom control    Accelerated hypertension - will likely require scheduled Hydralazine - will decide in AM   Hyperkalemia - status post kayexalate - now resolved and K is within normal limits    Anemia, normocytic, likely iron deficiency and chronic disease  - status post 1 units of PRBC on admission - CBC in AM, follow up on anemia panel - may need additional transfusion   Consultants:  None  Procedures/Studies: Dg Chest 2 View 12/29/2012   Enlargement of cardiac silhouette with pulmonary vascular congestion. Peribronchial thickening with perihilar infiltrates which could represent pulmonary edema/CHF or infection.    US Renal 12/29/2012    No  hydronephrosis. Mildly increased echogenicity is suspected.  This could represent medical renal disease.     Antibiotics:  None  Code Status: Full Family Communication: Pt at bedside Disposition Plan: Home when medically stable  HPI/Subjective: No events overnight.   Objective: Filed Vitals:   12/29/12 1318 12/29/12 1624 12/29/12 2150 12/30/12 0549  BP: 169/94  162/85 153/84  Pulse: 84  91 84  Temp: 98.6 F (37 C)  97.9 F (36.6 C) 97.3 F (36.3 C)  TempSrc: Oral  Oral Oral  Resp: 18  19 19   Height:   6\' 3"  (1.905 m)   Weight:  98.385 kg (216 lb 14.4 oz) 95.3 kg (210 lb 1.6 oz) 95.3 kg (210 lb 1.6 oz)  SpO2: 94%  99% 96%    Intake/Output Summary (Last 24 hours) at 12/30/12 0945 Last data filed at 12/30/12 0200  Gross per 24 hour  Intake    350 ml  Output   1550 ml  Net  -1200 ml    Exam:   General:  Pt is alert, follows commands appropriately, not in acute distress  Cardiovascular: Regular rate and rhythm, S1/S2, no murmurs, no rubs, no gallops  Respiratory: Clear to auscultation bilaterally, no wheezing, no crackles, no rhonchi  Abdomen: Soft, non tender, non distended, bowel sounds present, no guarding  Extremities: No edema, pulses DP and PT palpable bilaterally  Neuro: Grossly nonfocal  Data Reviewed: Basic Metabolic Panel:  Recent Labs Lab 12/29/12 1431 12/30/12 0400  NA 136 138  K 5.4* 4.8  CL 107  107  CO2 22 25  GLUCOSE 147* 132*  BUN 38* 34*  CREATININE 2.74* 2.71*  CALCIUM 8.5 8.2*   Liver Function Tests:  Recent Labs Lab 12/29/12 1431  AST 25  ALT 27  ALKPHOS 219*  BILITOT 0.2*  PROT 8.3  ALBUMIN 2.5*   CBC:  Recent Labs Lab 12/29/12 1431 12/30/12 0400  WBC 7.4 6.0  NEUTROABS 4.9  --   HGB 7.5* 7.0*  HCT 23.7* 21.8*  MCV 82.6 82.3  PLT 339 262   Cardiac Enzymes:  Recent Labs Lab 12/29/12 2241 12/30/12 0400  TROPONINI <0.30 <0.30   CBG:  Recent Labs Lab 12/29/12 2146 12/30/12 0710  GLUCAP 139* 102*      Scheduled Meds: . sodium chloride   Intravenous STAT  . aspirin EC  81 mg Oral Daily  . brimonidine  1 drop Both Eyes BID  . enoxaparin (LOVENOX) injection  40 mg Subcutaneous QHS  . furosemide  40 mg Intravenous Q12H  . insulin aspart  0-5 Units Subcutaneous QHS  . insulin aspart  0-9 Units Subcutaneous TID WC  . insulin aspart protamine- aspart  12 Units Subcutaneous BID WC  . ketorolac  1 drop Left Eye Daily  . pneumococcal 23 valent vaccine  0.5 mL Intramuscular Tomorrow-1000  . sodium chloride  3 mL Intravenous Q12H  . sodium chloride  3 mL Intravenous Q12H  . timolol  1 drop Both Eyes BID  . Travoprost (BAK Free)  1 drop Both Eyes QHS   Continuous Infusions:    Faye Ramsay, MD  [facility] Pager [phone]  If 7PM-7AM, please contact night-coverage [email or web address] Password [email or web address] 12/30/2012, 9:45 AM   LOS: 1 day

## 2012-12-30 NOTE — Progress Notes (Signed)
Hypoglycemic Event  CBG: 33  Treatment: 50% dextrose --50 ml   Symptoms: None  Follow-up CBG: Time: 1230 CBG Result:140  Possible Reasons for Event: unknown   Comments/MD notified:Dr. Milinda Pointer, [place]  Remember to initiate Hypoglycemia Order Set & complete

## 2012-12-30 NOTE — Progress Notes (Signed)
Echo Lab  2D Echocardiogram completed.  Say[place], RDCS 12/30/2012 10:38 AM

## 2012-12-30 NOTE — Progress Notes (Signed)
OT Cancellation Note  Patient Details Name: Bryan Wilkerson MRN: [id number] DOB: 11-13-1965   Cancelled Treatment:    Reason Eval/Treat Not Completed: Medical issues which prohibited therapy.Admitted with severe anemia and order for 1 unit PRBCs. Will await transfusion prior to eval    Britt Bottom 12/30/2012, 11:51 AM

## 2012-12-30 NOTE — Progress Notes (Signed)
PT Cancellation Note  Patient Details Name: Bryan Wilkerson MRN: [id number] DOB: January 05, 1966   Cancelled Treatment:    Reason Eval/Treat Not Completed: Medical issues which prohibited therapy Admitted with severe anemia and order for 1 unit PRBCs.  Will await transfusion prior to eval.   Yordy Matton,KATHrine E 12/30/2012, 11:26 AM Carmelia Bake, PT, DPT 12/30/2012 Pager: [phone]

## 2012-12-31 LAB — CBC
Platelets: 260 10*3/uL (ref 150–400)
RBC: 2.57 MIL/uL — ABNORMAL LOW (ref 4.22–5.81)
RDW: 14.2 % (ref 11.5–15.5)
WBC: 6.1 10*3/uL (ref 4.0–10.5)

## 2012-12-31 LAB — RAPID URINE DRUG SCREEN, HOSP PERFORMED
Amphetamines: NOT DETECTED
Cocaine: NOT DETECTED
Opiates: NOT DETECTED
Tetrahydrocannabinol: NOT DETECTED

## 2012-12-31 LAB — BASIC METABOLIC PANEL
CO2: 25 mEq/L (ref 19–32)
Calcium: 8 mg/dL — ABNORMAL LOW (ref 8.4–10.5)
Chloride: 104 mEq/L (ref 96–112)
GFR calc Af Amer: 33 mL/min — ABNORMAL LOW (ref >90)
Sodium: 136 mEq/L (ref 135–145)

## 2012-12-31 LAB — GLUCOSE, CAPILLARY
Glucose-Capillary: 93 mg/dL (ref 70–99)
Glucose-Capillary: 179 mg/dL — ABNORMAL HIGH (ref 70–99)
Glucose-Capillary: 41 mg/dL — CL (ref 70–99)

## 2012-12-31 MED ORDER — DEXTROSE 50 % IV SOLN
25.0000 mL | Freq: Once | INTRAVENOUS | Status: AC | PRN
  Administered 2012-12-31: 25 mL via INTRAVENOUS

## 2012-12-31 MED ORDER — FERROUS SULFATE 325 (65 FE) MG PO TABS
325.0000 mg | ORAL_TABLET | Freq: Two times a day (BID) | ORAL | Status: DC
  Administered 2012-12-31 – 2013-01-01 (×2): 325 mg via ORAL
  Filled 2012-12-31 (×4): qty 1

## 2012-12-31 MED ORDER — DEXTROSE 50 % IV SOLN
INTRAVENOUS | Status: AC
  Administered 2012-12-31: 22:00:00
  Filled 2012-12-31: qty 50

## 2012-12-31 MED ORDER — FUROSEMIDE 40 MG PO TABS
40.0000 mg | ORAL_TABLET | Freq: Two times a day (BID) | ORAL | Status: DC
  Administered 2012-12-31 – 2013-01-01 (×2): 40 mg via ORAL
  Filled 2012-12-31 (×4): qty 1

## 2012-12-31 MED ORDER — HYDRALAZINE HCL 25 MG PO TABS
25.0000 mg | ORAL_TABLET | Freq: Three times a day (TID) | ORAL | Status: DC
  Administered 2012-12-31 – 2013-01-01 (×3): 25 mg via ORAL
  Filled 2012-12-31 (×6): qty 1

## 2012-12-31 NOTE — Progress Notes (Addendum)
Patient ID: Bryan Wilkerson, male   DOB: 1966/06/07, 47 y.o.   MRN: [id number] [facility] PROGRESS NOTE  Bryan Wilkerson [id number] DOB: January 25, 1966 DOA: 12/29/2012 PCP: Jacelyn Pi, MD  Brief narrative:  47 y.o. male with h/o Hypertension, DM, psoriasis, came in for worsening sob on exertion, and pedal edema for 2 weeks. Sob not associated with chest pain, or dizziness . No fever or chills. No pnd, or orthopnea. No nausea , vomiting or abd pain. He went to urgent care and was referred to ED for admission for possible CHF. On arrival to ED, his probnp was found to be elevated, CXR showed pulmonary vascular congestion, and in acute on CKD, hyperkalemia. He was referred to medical service for management of acute CHF.   Active Problems:  CHF (congestive heart failure), NYHA class II  - likely diastolic as systolic function appears to be normal  - follow up on UDS as it was not obtained on admission  - will continue Lasix IV for now, strict I's and O's  - daily weights monioring  Acute on chronic renal failure, stage II - III  - unclear etiology, renal US with chronic medical renal disease  - monitor renal function closely, creatinine is trending down   - FENa > 8, pt will need referral to nephrology upon discharge  - avoid nephrotoxins  - Lasix necessary to ensure symptom control  Accelerated hypertension  - order scheduled Hydralazine  - add PRN Hydralazine  Hyperkalemia  - status post kayexalate  - now resolved and K is within normal limits  Anemia, normocytic, likely iron deficiency and chronic disease  - plan on transfusing 2 units of PRBC and add Iron supplementation  - CBC in AM Diabetes, insulin dependent - reasonable inpatient control, A1C 6.5 indicative of excellent control  - continue Insulin   Consultants:  None Procedures/Studies:  Dg Chest 2 View 12/29/2012  Enlargement of cardiac silhouette with pulmonary vascular congestion. Peribronchial thickening with  perihilar infiltrates which could represent pulmonary edema/CHF or infection.  US Renal 12/29/2012  No hydronephrosis. Mildly increased echogenicity is suspected. This could represent medical renal disease.  Antibiotics:  None  Code Status: Full  Family Communication: Pt at bedside  Disposition Plan: Home when medically stable   HPI/Subjective: No events overnight.   Objective: Filed Vitals:   12/31/12 0748 12/31/12 0838 12/31/12 0855 12/31/12 0955  BP: 167/85 151/76 156/78 153/82  Pulse: 72 83 75 73  Temp: 98.1 F (36.7 C) 98.2 F (36.8 C) 98.2 F (36.8 C) 98.4 F (36.9 C)  TempSrc: Oral Oral Oral Oral  Resp: 18 18 18 18   Height:      Weight:      SpO2:        Intake/Output Summary (Last 24 hours) at 12/31/12 1049 Last data filed at 12/31/12 0840  Gross per 24 hour  Intake  742.5 ml  Output   3575 ml  Net -2832.5 ml    Exam:   General:  Pt is alert, follows commands appropriately, not in acute distress  Cardiovascular: Regular rate and rhythm, S1/S2, no murmurs, no rubs, no gallops  Respiratory: Clear to auscultation bilaterally, no wheezing, no crackles, no rhonchi  Abdomen: Soft, non tender, non distended, bowel sounds present, no guarding  Extremities: No edema, pulses DP and PT palpable bilaterally  Neuro: Grossly nonfocal  Data Reviewed: Basic Metabolic Panel:  Recent Labs Lab 12/29/12 1431 12/30/12 0400 12/31/12 0520  NA 136 138 136  K 5.4* 4.8 4.1  CL 107  107 104  CO2 22 25 25   GLUCOSE 147* 132* 88  BUN 38* 34* 31*  CREATININE 2.74* 2.71* 2.56*  CALCIUM 8.5 8.2* 8.0*   Liver Function Tests:  Recent Labs Lab 12/29/12 1431  AST 25  ALT 27  ALKPHOS 219*  BILITOT 0.2*  PROT 8.3  ALBUMIN 2.5*   CBC:  Recent Labs Lab 12/29/12 1431 12/30/12 0400 12/31/12 0520  WBC 7.4 6.0 6.1  NEUTROABS 4.9  --   --   HGB 7.5* 7.0* 6.7*  HCT 23.7* 21.8* 20.8*  MCV 82.6 82.3 80.9  PLT 339 262 260   Cardiac Enzymes:  Recent Labs Lab  12/29/12 2241 12/30/12 0400 12/30/12 1016  TROPONINI <0.30 <0.30 <0.30   CBG:  Recent Labs Lab 12/30/12 1204 12/30/12 1241 12/30/12 1628 12/30/12 2151 12/31/12 0744  GLUCAP 33* 140* 107* 90 93    Scheduled Meds: . aspirin EC  81 mg Oral Daily  . brimonidine  1 drop Both Eyes BID  . enoxaparin (LOVENOX) injection  40 mg Subcutaneous QHS  . furosemide  40 mg Intravenous Q12H  . insulin aspart  0-5 Units Subcutaneous QHS  . insulin aspart  0-9 Units Subcutaneous TID WC  . insulin aspart protamine- aspart  10 Units Subcutaneous BID WC  . ketorolac  1 drop Left Eye Daily  . sodium chloride  3 mL Intravenous Q12H  . sodium chloride  3 mL Intravenous Q12H  . timolol  1 drop Both Eyes BID  . Travoprost (BAK Free)  1 drop Both Eyes QHS   Continuous Infusions:    Faye Ramsay, MD  [facility] Pager [phone]  If 7PM-7AM, please contact night-coverage [email or web address] Password [facility] 12/31/2012, 10:49 AM   LOS: 2 days

## 2012-12-31 NOTE — Progress Notes (Signed)
CRITICAL VALUE ALERT  Critical value received: Hgb-6.7  Date of notification: 12/31/12   Time of notification: 0555  Critical value read back:yes  Nurse who received alert:  Roslyn Smiling, RN  MD notified (1st page):  Fredirick Maudlin, NP  Time of first page:  [phone]  MD notified (2nd page):  Time of second page:  Responding MD:  Fredirick Maudlin, NP  Time MD responded:  [phone]

## 2012-12-31 NOTE — Progress Notes (Signed)
PT Cancellation Note  Patient Details Name: Bryan Wilkerson MRN: [id number] DOB: [birth year]   Cancelled Treatment:    Reason Eval/Treat Not Completed: Medical issues which prohibited therapy. Pt still with hemoglobin dropping, currently at 6.7, will hold PT eval and check tomorrow.    Clide Dales 12/31/2012, 4:33 PM Clide Dales, PT Pager: [phone] 12/31/2012

## 2013-01-01 LAB — CBC
Hemoglobin: 9.3 g/dL — ABNORMAL LOW (ref 13.0–17.0)
MCH: 26.4 pg (ref 26.0–34.0)
Platelets: 292 10*3/uL (ref 150–400)
RBC: 3.52 MIL/uL — ABNORMAL LOW (ref 4.22–5.81)
WBC: 7.1 10*3/uL (ref 4.0–10.5)

## 2013-01-01 LAB — BASIC METABOLIC PANEL
CO2: 27 mEq/L (ref 19–32)
Calcium: 8.3 mg/dL — ABNORMAL LOW (ref 8.4–10.5)
Chloride: 102 mEq/L (ref 96–112)
Glucose, Bld: 74 mg/dL (ref 70–99)
Sodium: 135 mEq/L (ref 135–145)

## 2013-01-01 LAB — TYPE AND SCREEN
ABO/RH(D): O POS
Antibody Screen: NEGATIVE
Unit division: 0

## 2013-01-01 LAB — GLUCOSE, CAPILLARY: Glucose-Capillary: 75 mg/dL (ref 70–99)

## 2013-01-01 LAB — PRO B NATRIURETIC PEPTIDE: Pro B Natriuretic peptide (BNP): 5926 pg/mL — ABNORMAL HIGH (ref 0–125)

## 2013-01-01 MED ORDER — FUROSEMIDE 20 MG PO TABS: 20.0000 mg | ORAL_TABLET | Freq: Two times a day (BID) | ORAL | Status: DC

## 2013-01-01 MED ORDER — INSULIN ASPART PROT & ASPART (70-30 MIX) 100 UNIT/ML ~~LOC~~ SUSP: 7.0000 [IU] | Freq: Two times a day (BID) | SUBCUTANEOUS | Status: DC

## 2013-01-01 MED ORDER — FERROUS SULFATE 325 (65 FE) MG PO TABS: 325.0000 mg | ORAL_TABLET | Freq: Two times a day (BID) | ORAL | Status: DC

## 2013-01-01 MED ORDER — HYDRALAZINE HCL 25 MG PO TABS: 25.0000 mg | ORAL_TABLET | Freq: Three times a day (TID) | ORAL | Status: DC

## 2013-01-01 NOTE — Discharge Summary (Signed)
Physician Discharge Summary  Bryan Wilkerson [id number] DOB: Nov 21, 1965 DOA: 12/29/2012  PCP: Jacelyn Pi, MD  Admit date: 12/29/2012 Discharge date: 01/01/2013  Recommendations for Outpatient Follow-up:  1. Pt will need to follow up with PCP in 2-3 weeks post discharge 2. Please obtain BMP to evaluate electrolytes and kidney function 3. Please also check CBC to evaluate Hg and Hct levels 4. Please note that patient was insisting on leaving today as he felt well 5. He was made aware that he needs to followup with kidney specialist to ensure followup on kidney function tests 6. Patient also advised that he needs a cardiologist in the future due to congestive heart failure, diastolic  Discharge Diagnoses:   Acute on chronic renal failure Active Problems:   Acute on chronic renal failure   CHF (congestive heart failure), NYHA class II   Accelerated hypertension   Hyperkalemia   Anemia  Discharge Condition: Stable  Diet recommendation: Heart healthy diet discussed in details   Brief narrative:  47 y.o. male with h/o Hypertension, DM, psoriasis, came in for worsening sob on exertion, and pedal edema for 2 weeks. Sob not associated with chest pain, or dizziness . No fever or chills. No pnd, or orthopnea. No nausea , vomiting or abd pain. He went to urgent care and was referred to ED for admission for possible CHF. On arrival to ED, his probnp was found to be elevated, CXR showed pulmonary vascular congestion, and in acute on CKD, hyperkalemia. He was referred to medical service for management of acute CHF.   Active Problems:  CHF (congestive heart failure), NYHA class II  - likely diastolic as systolic function appears to be normal  - Patient was started on Lasix and has responded well, diuresis well and clinically stable this morning - daily weights monitor as well as urine output, no acute problems noted, patient advise that he needs to followup with kidney specialist  Acute on  chronic renal failure, stage II - III  - unclear etiology, renal US with chronic medical renal disease  - monitor renal function closely, creatinine is trending down  - FENa > 8, pt will need referral to nephrology upon discharge, the patient already has appointment scheduled with Dr. Justin Mend  - avoid nephrotoxins  - Lasix necessary to ensure symptom control  - Renal diet discussed in detail over 45 minutes Accelerated hypertension  - order scheduled Hydralazine  - Patient responding well, we have discussed compliance in detail Hyperkalemia  - status post kayexalate  - now resolved and K is within normal limits  Anemia, normocytic, likely iron deficiency and chronic disease  - Patient has received a total of 2 units of blood transfusion, appropriate increase in hemoglobin - Will also start iron supplements as he has iron deficiency anemia as well as chronic disease anemia Diabetes, insulin dependent  - reasonable inpatient control, A1C 6.5 indicative of excellent control  - continue Insulin  Consultants:  None Procedures/Studies:  Dg Chest 2 View 12/29/2012  Enlargement of cardiac silhouette with pulmonary vascular congestion. Peribronchial thickening with perihilar infiltrates which could represent pulmonary edema/CHF or infection.  US Renal 12/29/2012  No hydronephrosis. Mildly increased echogenicity is suspected. This could represent medical renal disease.  Antibiotics:  None  Code Status: Full  Family Communication: Pt at bedside   Discharge Exam: Filed Vitals:   01/01/13 0512  BP: 167/88  Pulse: 73  Temp: 98 F (36.7 C)  Resp: 18   Filed Vitals:   12/31/12 2121 12/31/12 2130 01/01/13  0047 01/01/13 0512  BP: 146/73 148/84 143/73 167/88  Pulse:  66  73  Temp:  97.5 F (36.4 C)  98 F (36.7 C)  TempSrc:  Oral  Oral  Resp:  18  18  Height:      Weight:    88.6 kg (195 lb 5.2 oz)  SpO2:  100%  94%    General: Pt is alert, follows commands appropriately, not in acute  distress Cardiovascular: Regular rate and rhythm, S1/S2 +, no murmurs, no rubs, no gallops Respiratory: Clear to auscultation bilaterally, no wheezing, no crackles, no rhonchi Abdominal: Soft, non tender, non distended, bowel sounds +, no guarding Extremities: no edema, no cyanosis, pulses palpable bilaterally DP and PT Neuro: Grossly nonfocal  Discharge Instructions  Discharge Orders   Future Orders Complete By Expires     Diet - low sodium heart healthy  As directed     Increase activity slowly  As directed         Medication List    STOP taking these medications       losartan 25 MG tablet  Commonly known as:  COZAAR      TAKE these medications       adalimumab 40 MG/0.8ML injection  Commonly known as:  HUMIRA  Inject 40 mg into the skin every 14 (fourteen) days.     amLODipine 2.5 MG tablet  Commonly known as:  NORVASC  Take 2.5 mg by mouth daily.     brimonidine 0.15 % ophthalmic solution  Commonly known as:  ALPHAGAN  Place 1 drop into both eyes 2 (two) times daily.     Bromfenac Sodium 0.07 % Soln  Apply 1 drop to eye daily. In left eye     ferrous sulfate 325 (65 FE) MG tablet  Take 1 tablet (325 mg total) by mouth 2 (two) times daily with a meal.     furosemide 20 MG tablet  Commonly known as:  LASIX  Take 1 tablet (20 mg total) by mouth 2 (two) times daily.     hydrALAZINE 25 MG tablet  Commonly known as:  APRESOLINE  Take 1 tablet (25 mg total) by mouth every 8 (eight) hours.     insulin aspart protamine- aspart (70-30) 100 UNIT/ML injection  Commonly known as:  NOVOLOG MIX 70/30  Inject 0.07 mLs (7 Units total) into the skin 2 (two) times daily.     timolol 0.5 % ophthalmic solution  Commonly known as:  TIMOPTIC  Place 1 drop into both eyes 2 (two) times daily.     Travoprost (BAK Free) 0.004 % Soln ophthalmic solution  Commonly known as:  TRAVATAN  Place 1 drop into both eyes at bedtime.           Follow-up Information   Follow up with  Jacelyn Pi, MD In 2 weeks.   Contact information:   [address] [place] [address] [phone]       Follow up with Sherril Croon, MD In 2 weeks.   Contact information:   [place] [address] [phone]       Follow up with Faye Ramsay, MD.   Contact information:   [address] [place] [address] [phone]       The results of significant diagnostics from this hospitalization (including imaging, microbiology, ancillary and laboratory) are listed below for reference.     Microbiology: No results found for this or any previous visit (from the past 240  hour(s)).   Labs: Basic Metabolic Panel:  Recent Labs Lab 12/29/12 1431 12/30/12 0400 12/31/12 0520 01/01/13 0522  NA 136 138 136 135  K 5.4* 4.8 4.1 4.3  CL 107 107 104 102  CO2 22 25 25 27   GLUCOSE 147* 132* 88 74  BUN 38* 34* 31* 30*  CREATININE 2.74* 2.71* 2.56* 2.61*  CALCIUM 8.5 8.2* 8.0* 8.3*   Liver Function Tests:  Recent Labs Lab 12/29/12 1431  AST 25  ALT 27  ALKPHOS 219*  BILITOT 0.2*  PROT 8.3  ALBUMIN 2.5*   CBC:  Recent Labs Lab 12/29/12 1431 12/30/12 0400 12/31/12 0520 01/01/13 0522  WBC 7.4 6.0 6.1 7.1  NEUTROABS 4.9  --   --   --   HGB 7.5* 7.0* 6.7* 9.3*  HCT 23.7* 21.8* 20.8* 28.6*  MCV 82.6 82.3 80.9 81.3  PLT 339 262 260 292   Cardiac Enzymes:  Recent Labs Lab 12/29/12 2241 12/30/12 0400 12/30/12 1016  TROPONINI <0.30 <0.30 <0.30   BNP: BNP (last 3 results)  Recent Labs  12/29/12 1431 01/01/13 0522  PROBNP 3541.0* 5926.0*   CBG:  Recent Labs Lab 12/31/12 1638 12/31/12 2128 12/31/12 2219 12/31/12 2251 01/01/13 0803  GLUCAP 121* 41* 68* 93 75   SIGNED: Time coordinating discharge: Over 30 minutes  Faye Ramsay, MD  [facility] 01/01/2013, 11:26 AM Pager [phone]  If 7PM-7AM, please contact night-coverage [email or web address] Password [email or web address]

## 2013-01-01 NOTE — Evaluation (Signed)
Occupational Therapy Evaluation and Discharge Patient Details Name: Bryan Wilkerson MRN: [id number] DOB: [birth year] Today's Date: 01/01/2013 Time: [id number] OT Time Calculation (min): 12 min  OT Assessment / Plan / Recommendation History of present illness shortness of breath  And pedal edema since 2 weeks found to have CHF, Acute on chronic renal failure, and Anemia.   Clinical Impression   Pt Independent with all aspects of self care and mobility. No OT or PT needs identified--PT made aware. OT will sign off    OT Assessment  Patient does not need any further OT services   Follow Up Recommendations  No OT follow up       Equipment Recommendations  None recommended by OT          Precautions / Restrictions Precautions Precautions: None Restrictions Weight Bearing Restrictions: No       ADL  Transfers/Ambulation Related to ADLs: Pt independent with all transfers and ambulation including stairs ADL Comments: Pt independent with all BADLS        Visit Information  Last OT Received On: 01/01/13 Assistance Needed: +1 History of Present Illness: shortness of breath  And pedal edema since 2 weeks found to have CHF, Acute on chronic renal failure, and Anemia.       Prior [place] expects to be discharged to:: Private residence Living Arrangements: Spouse/significant other Available Help at Discharge: Family;Available PRN/intermittently Type of Home: House Home Access: Stairs to enter [employer] of Steps: 5 Entrance Stairs-Rails: None Home Layout: One level Home Equipment: None Prior Function Level of Independence: Independent Comments: Works full time for [employer]: No difficulties         Vision/Perception Vision - History Baseline Vision: No visual deficits Patient Visual Report: No change from baseline   Cognition  Cognition Arousal/Alertness: Awake/alert Behavior  During Therapy: WFL for tasks assessed/performed Overall Cognitive Status: Within Functional Limits for tasks assessed    Extremity/Trunk Assessment Upper Extremity Assessment Upper Extremity Assessment: Overall WFL for tasks assessed Lower Extremity Assessment Lower Extremity Assessment: Overall WFL for tasks assessed     Mobility Bed Mobility Bed Mobility: Supine to Sit;Sitting - Scoot to Edge of Bed Supine to Sit: 7: Independent Sitting - Scoot to Edge of Bed: 7: Independent Transfers Transfers: Sit to Stand;Stand to Sit Sit to Stand: 7: Independent;Without upper extremity assist;From bed Stand to Sit: 7: Independent;Without upper extremity assist;To bed           End of Session OT - End of Session Activity Tolerance: Patient tolerated treatment well Patient left: with family/visitor present;with nursing/sitter in room (Sitting EOB) Nurse Communication: Mobility status    Almon Register [id number] 01/01/2013, 8:37 AM

## 2013-02-13 ENCOUNTER — Other Ambulatory Visit: Payer: Self-pay | Admitting: Internal Medicine

## 2013-04-06 ENCOUNTER — Telehealth: Payer: Self-pay | Admitting: Internal Medicine

## 2013-04-06 NOTE — Telephone Encounter (Signed)
Patient would like to discuss medication refills. Patient has a h/f on 10/10 at [facility].

## 2013-04-13 ENCOUNTER — Ambulatory Visit: Payer: 59 | Attending: Internal Medicine | Admitting: Internal Medicine

## 2013-04-13 ENCOUNTER — Encounter: Payer: Self-pay | Admitting: Internal Medicine

## 2013-04-13 VITALS — BP 166/94 | HR 83 | Temp 97.6°F | Resp 16 | Ht 72.0 in | Wt 189.0 lb

## 2013-04-13 DIAGNOSIS — I10 Essential (primary) hypertension: Secondary | ICD-10-CM | POA: Diagnosis present

## 2013-04-13 DIAGNOSIS — I509 Heart failure, unspecified: Secondary | ICD-10-CM | POA: Insufficient documentation

## 2013-04-13 MED ORDER — AMLODIPINE BESYLATE 2.5 MG PO TABS: 2.5000 mg | ORAL_TABLET | Freq: Every day | ORAL | Status: DC

## 2013-04-13 MED ORDER — HYDRALAZINE HCL 25 MG PO TABS: 25.0000 mg | ORAL_TABLET | Freq: Three times a day (TID) | ORAL | Status: DC

## 2013-04-13 MED ORDER — FUROSEMIDE 20 MG PO TABS: 20.0000 mg | ORAL_TABLET | Freq: Two times a day (BID) | ORAL | Status: DC

## 2013-04-13 MED ORDER — FERROUS SULFATE 325 (65 FE) MG PO TABS: 325.0000 mg | ORAL_TABLET | Freq: Two times a day (BID) | ORAL | Status: DC

## 2013-04-13 MED ORDER — BROMFENAC SODIUM 0.07 % OP SOLN: 1.0000 [drp] | Freq: Every day | OPHTHALMIC | Status: DC

## 2013-04-13 MED ORDER — BRIMONIDINE TARTRATE 0.15 % OP SOLN: 1.0000 [drp] | Freq: Two times a day (BID) | OPHTHALMIC | Status: DC

## 2013-04-13 MED ORDER — INSULIN ASPART PROT & ASPART (70-30 MIX) 100 UNIT/ML ~~LOC~~ SUSP: 7.0000 [IU] | Freq: Two times a day (BID) | SUBCUTANEOUS | Status: DC

## 2013-04-13 MED ORDER — TRAVOPROST (BAK FREE) 0.004 % OP SOLN: 1.0000 [drp] | Freq: Every day | OPHTHALMIC | Status: DC

## 2013-04-13 MED ORDER — TIMOLOL MALEATE 0.5 % OP SOLN: 1.0000 [drp] | Freq: Two times a day (BID) | OPHTHALMIC | Status: DC

## 2013-04-13 NOTE — Progress Notes (Signed)
Patient ID: Bryan Wilkerson, male   DOB: 1966/03/06, 47 y.o.   MRN: [id number]   CC: Regular followup   Bryan Wilkerson is very pleasant 47 year old male who comes to clinic for followup. He reports doing well and takes medicines as prescribed. He denies fevers and chills, no chest pain or shortness of breath, no abdominal or urinary concerns. He also needs refill on his medicines.  Allergies  Allergen Reactions  . Penicillins     childhood   Past Medical History  Diagnosis Date  . Hypertension   . Diabetes mellitus without complication   . Psoriasis    Current Outpatient Prescriptions on File Prior to Visit  Medication Sig Dispense Refill  . adalimumab (HUMIRA) 40 MG/0.8ML injection Inject 40 mg into the skin every 14 (fourteen) days.       No current facility-administered medications on file prior to visit.   No known family medical history her  History   Social History  . Marital Status: Married    Spouse Name: N/A    Number of Children: N/A  . Years of Education: N/A   Occupational History  . Not on file.   Social History Main Topics  . Smoking status: Never Smoker   . Smokeless tobacco: Not on file  . Alcohol Use: No  . Drug Use: No  . Sexual Activity: Not on file   Other Topics Concern  . Not on file   Social History Narrative  . No narrative on file    Review of Systems  Constitutional: Negative for fever, chills, diaphoresis, activity change, appetite change and fatigue.  HENT: Negative for ear pain, nosebleeds, congestion, facial swelling, rhinorrhea, neck pain, neck stiffness and ear discharge.   Eyes: Negative for pain, discharge, redness, itching and visual disturbance.  Respiratory: Negative for cough, choking, chest tightness, shortness of breath, wheezing and stridor.   Cardiovascular: Negative for chest pain, palpitations and leg swelling.  Gastrointestinal: Negative for abdominal distention.  Genitourinary: Negative for dysuria, urgency, frequency,  hematuria, flank pain, decreased urine volume, difficulty urinating and dyspareunia.  Musculoskeletal: Negative for back pain, joint swelling, arthralgias and gait problem.  Neurological: Negative for dizziness, tremors, seizures, syncope, facial asymmetry, speech difficulty, weakness, light-headedness, numbness and headaches.  Hematological: Negative for adenopathy. Does not bruise/bleed easily.  Psychiatric/Behavioral: Negative for hallucinations, behavioral problems, confusion, dysphoric mood, decreased concentration and agitation.    Objective:   Filed Vitals:   04/13/13 1626  BP: 166/94  Pulse: 83  Temp: 97.6 F (36.4 C)  Resp: 16    Physical Exam  Constitutional: Appears well-developed and well-nourished. No distress.  CVS: RRR, S1/S2 +, no murmurs, no gallops, no carotid bruit.  Pulmonary: Effort and breath sounds normal, no stridor, rhonchi, wheezes, rales.  Abdominal: Soft. BS +,  no distension, tenderness, rebound or guarding.  Musculoskeletal: Normal range of motion. No edema and no tenderness.   Lab Results  Component Value Date   WBC 7.1 01/01/2013   HGB 9.3* 01/01/2013   HCT 28.6* 01/01/2013   MCV 81.3 01/01/2013   PLT 292 01/01/2013   Lab Results  Component Value Date   CREATININE 2.61* 01/01/2013   BUN 30* 01/01/2013   NA 135 01/01/2013   K 4.3 01/01/2013   CL 102 01/01/2013   CO2 27 01/01/2013    Lab Results  Component Value Date   HGBA1C 6.5* 12/30/2012   Lipid Panel     Component Value Date/Time   CHOL 150 12/30/2012 0400   TRIG 95 12/30/2012 0400  HDL 53 12/30/2012 0400   CHOLHDL 2.8 12/30/2012 0400   VLDL 19 12/30/2012 0400   LDLCALC 78 12/30/2012 0400       Assessment and plan:   Patient Active Problem List   Diagnosis Date Noted  . Acute on chronic renal failure - will check BMP today to ensure that creatinine remains at baseline  12/29/2012  . CHF (congestive heart failure), NYHA class II - appears to be well compensated and at the bases. Patient  advised to check his weight regularly and to call his back if he sees increase in weight over 3 pounds in 24-48 hours  12/29/2012  . Accelerated hypertension - slightly above target range, we have discussed target range and I advised patient to continue taking medicines as prescribed  12/29/2012

## 2013-04-13 NOTE — Patient Instructions (Signed)

## 2013-04-13 NOTE — Progress Notes (Signed)
Pt is a HFU here to follow up with Dr. Doyle Askew Pt has HTN and CHF. Here today to establish care.

## 2013-04-14 LAB — COMPREHENSIVE METABOLIC PANEL
ALT: 17 U/L (ref 0–53)
Albumin: 2.7 g/dL — ABNORMAL LOW (ref 3.5–5.2)
CO2: 19 mEq/L (ref 19–32)
Calcium: 8.1 mg/dL — ABNORMAL LOW (ref 8.4–10.5)
Chloride: 106 mEq/L (ref 96–112)
Glucose, Bld: 264 mg/dL — ABNORMAL HIGH (ref 70–99)
Sodium: 134 mEq/L — ABNORMAL LOW (ref 135–145)
Total Bilirubin: 0.3 mg/dL (ref 0.3–1.2)
Total Protein: 7.2 g/dL (ref 6.0–8.3)

## 2013-04-16 NOTE — Addendum Note (Signed)
Addended by: Theodis Blaze on: 04/16/2013 12:30 PM   Modules accepted: Orders

## 2013-04-17 ENCOUNTER — Telehealth: Payer: Self-pay | Admitting: *Deleted

## 2013-04-17 NOTE — Telephone Encounter (Signed)
Message copied by Joan Mayans on Tue Apr 17, 2013  4:26 PM ------      Message from: Theodis Blaze      Created: Mon Apr 16, 2013 12:32 PM       Can we please call pt to let him know that his creatinine is slightly up from the last ready 3 months ago. He can continue taking same dose lasix but I have placed urgent referral to nephrologist for further assistance in management of his chronic kidney disease.             Thank you ------

## 2013-04-17 NOTE — Telephone Encounter (Signed)
Message copied by Joan Mayans on Tue Apr 17, 2013  4:18 PM ------      Message from: Theodis Blaze      Created: Mon Apr 16, 2013 12:32 PM       Can we please call pt to let him know that his creatinine is slightly up from the last ready 3 months ago. He can continue taking same dose lasix but I have placed urgent referral to nephrologist for further assistance in management of his chronic kidney disease.             Thank you ------

## 2013-07-19 ENCOUNTER — Ambulatory Visit: Payer: 59 | Admitting: Internal Medicine

## 2013-07-20 ENCOUNTER — Ambulatory Visit: Payer: 59

## 2013-10-30 ENCOUNTER — Ambulatory Visit: Payer: 59 | Admitting: Family Medicine

## 2013-10-31 ENCOUNTER — Ambulatory Visit: Payer: 59 | Attending: Internal Medicine | Admitting: Family Medicine

## 2013-10-31 ENCOUNTER — Encounter: Payer: Self-pay | Admitting: Family Medicine

## 2013-10-31 VITALS — BP 148/74 | HR 92 | Temp 98.0°F | Ht 72.0 in | Wt 203.4 lb

## 2013-10-31 DIAGNOSIS — N179 Acute kidney failure, unspecified: Secondary | ICD-10-CM

## 2013-10-31 DIAGNOSIS — R0989 Other specified symptoms and signs involving the circulatory and respiratory systems: Secondary | ICD-10-CM

## 2013-10-31 DIAGNOSIS — R0609 Other forms of dyspnea: Secondary | ICD-10-CM

## 2013-10-31 DIAGNOSIS — N189 Chronic kidney disease, unspecified: Secondary | ICD-10-CM

## 2013-10-31 DIAGNOSIS — R0600 Dyspnea, unspecified: Secondary | ICD-10-CM

## 2013-10-31 DIAGNOSIS — R0602 Shortness of breath: Secondary | ICD-10-CM | POA: Insufficient documentation

## 2013-10-31 DIAGNOSIS — I509 Heart failure, unspecified: Secondary | ICD-10-CM

## 2013-10-31 DIAGNOSIS — I10 Essential (primary) hypertension: Secondary | ICD-10-CM

## 2013-10-31 LAB — BASIC METABOLIC PANEL
BUN: 87 mg/dL — ABNORMAL HIGH (ref 6–23)
CALCIUM: 6.5 mg/dL — AB (ref 8.4–10.5)
CO2: 17 mEq/L — ABNORMAL LOW (ref 19–32)
Chloride: 110 mEq/L (ref 96–112)
Creat: 7.03 mg/dL — ABNORMAL HIGH (ref 0.50–1.35)
GLUCOSE: 166 mg/dL — AB (ref 70–99)
POTASSIUM: 5 meq/L (ref 3.5–5.3)
Sodium: 135 mEq/L (ref 135–145)

## 2013-10-31 LAB — CBC
HEMATOCRIT: 22.5 % — AB (ref 39.0–52.0)
HEMOGLOBIN: 7.3 g/dL — AB (ref 13.0–17.0)
MCH: 27.9 pg (ref 26.0–34.0)
MCHC: 32.4 g/dL (ref 30.0–36.0)
MCV: 85.9 fL (ref 78.0–100.0)
Platelets: 225 10*3/uL (ref 150–400)
RBC: 2.62 MIL/uL — AB (ref 4.22–5.81)
RDW: 16 % — ABNORMAL HIGH (ref 11.5–15.5)
WBC: 7 10*3/uL (ref 4.0–10.5)

## 2013-10-31 NOTE — Assessment & Plan Note (Signed)
At goal continue current medications  

## 2013-10-31 NOTE — Progress Notes (Signed)
   Subjective:    Patient ID: Bryan Wilkerson, male    DOB: 12-08-1965, 48 y.o.   MRN: [id number]  HPI HYPERTENSION Home BP readings: 190/118 Chest Pain: no Lightheadedness or Syncope: no Leg Swelling: no   Medications When took last medication:  This morning Misses taking medications:  rarely  Diet Ability to limit unhealthy foods:  yes  Exercise Frequency: none   Monitoring Labs and Parameters Last A1C:  Lab Results  Component Value Date   HGBA1C 6.5* 12/30/2012    Last Lipid:     Component Value Date/Time   CHOL 150 12/30/2012 0400   HDL 53 12/30/2012 0400    Last Bmet  Potassium  Date Value Ref Range Status  04/13/2013 4.8  3.5 - 5.3 mEq/L Final     Sodium  Date Value Ref Range Status  04/13/2013 134* 135 - 145 mEq/L Final     Creat  Date Value Ref Range Status  04/13/2013 4.83* 0.50 - 1.35 mg/dL Final     Creatinine, Ser  Date Value Ref Range Status  01/01/2013 2.61* 0.50 - 1.35 mg/dL Final      Last BPs:  BP Readings from Last 3 Encounters:  10/31/13 148/74  04/13/13 166/94  01/01/13 167/88    Weight history:  Wt Readings from Last 3 Encounters:  10/31/13 203 lb 6.4 oz (92.262 kg)  04/13/13 189 lb (85.73 kg)  01/01/13 195 lb 5.2 oz (88.6 kg)      Review of Symptoms - see HPI PMH - Smoking status noted.    Shortness of Breath Feeling shortness of breath with some exercise.  Admittedly out of shape.  No chest pain or leg swelling but occasional orthopnea.   History of anemia and CHF (diastolic).  No fever has been gaining weight   Lab Monitoring Potassium  Date Value Ref Range Status  04/13/2013 4.8  3.5 - 5.3 mEq/L Final     Sodium  Date Value Ref Range Status  04/13/2013 134* 135 - 145 mEq/L Final     Creat  Date Value Ref Range Status  04/13/2013 4.83* 0.50 - 1.35 mg/dL Final     Creatinine, Ser  Date Value Ref Range Status  01/01/2013 2.61* 0.50 - 1.35 mg/dL Final       Review of Symptoms - see HPI PMH - Smoking  status noted.     Lab Monitor Review of Symptoms - see HPI PMH - Smoking status noted.      Review of Systems     Objective:   Physical Exam No acute distress Lungs:  Normal respiratory effort, chest expands symmetrically. Lungs are clear to auscultation, no crackles or wheezes. Heart - Regular rate and rhythm.  No murmurs, gallops or rubs.    Extremities:  No cyanosis, edema, or deformity noted with good range of motion of all major joints.         Assessment & Plan:

## 2013-10-31 NOTE — Assessment & Plan Note (Addendum)
No obvious cause on exam but with history of anemia, CRF and chf will check labs.  Start gradual conditioning at home

## 2013-10-31 NOTE — Patient Instructions (Signed)
Thank you for coming in today.  We will do blood work today and call you with results  Start to do walking excercise  GRADUALLY.  *start by walking 5 min a day for 1 week. Then increase to 10 min a day for 1 week. Increase to 15 min a day for 1 week. We will try this until you reach 20 min a day.  If you notice that your shortness of breath is getting worse make an appointment to come back, but if it is severe go to ER.

## 2013-11-01 ENCOUNTER — Other Ambulatory Visit: Payer: Self-pay

## 2013-11-01 ENCOUNTER — Telehealth: Payer: Self-pay

## 2013-11-01 DIAGNOSIS — R748 Abnormal levels of other serum enzymes: Secondary | ICD-10-CM

## 2013-11-01 LAB — BRAIN NATRIURETIC PEPTIDE: BRAIN NATRIURETIC PEPTIDE: 461.9 pg/mL — AB (ref 0.0–100.0)

## 2013-11-01 NOTE — Telephone Encounter (Signed)
Attempted to call patient a second time Patient not available Left message on voice mail to please return our call ASAP

## 2013-11-01 NOTE — Telephone Encounter (Signed)
Tried to call patient about his lab results Not available Left message to return our call ASAP Needs to be seen by nephrologist

## 2013-11-01 NOTE — Telephone Encounter (Signed)
candice from [facility] called with an alert lab Patients  creatine is 7.03 Dr Erin Hearing is aware

## 2013-11-02 ENCOUNTER — Telehealth: Payer: Self-pay

## 2013-11-02 NOTE — Telephone Encounter (Signed)
Tried to call emergency contact person on file(Erica) Not available Left message on machine to return our call ASAP

## 2013-11-02 NOTE — Telephone Encounter (Signed)
Patient called to return [place] call. Patient spoke with Dr. Osborne Oman. Alverda Skeans, RN

## 2013-11-12 ENCOUNTER — Other Ambulatory Visit: Payer: Self-pay | Admitting: *Deleted

## 2013-11-12 DIAGNOSIS — Z01810 Encounter for preprocedural cardiovascular examination: Secondary | ICD-10-CM | POA: Insufficient documentation

## 2013-11-12 DIAGNOSIS — N186 End stage renal disease: Secondary | ICD-10-CM | POA: Insufficient documentation

## 2013-11-15 ENCOUNTER — Encounter (HOSPITAL_COMMUNITY)
Admission: RE | Admit: 2013-11-15 | Discharge: 2013-11-15 | Disposition: A | Discharge status: Home / Self Care | Payer: 59 | Source: Ambulatory Visit | Attending: Nephrology | Admitting: Nephrology

## 2013-11-15 ENCOUNTER — Encounter (HOSPITAL_COMMUNITY): Payer: 59

## 2013-11-15 ENCOUNTER — Ambulatory Visit: Payer: 59 | Admitting: Vascular Surgery

## 2013-11-15 ENCOUNTER — Other Ambulatory Visit (HOSPITAL_COMMUNITY): Payer: 59

## 2013-11-15 ENCOUNTER — Encounter: Payer: Self-pay | Admitting: Vascular Surgery

## 2013-11-15 DIAGNOSIS — N179 Acute kidney failure, unspecified: Secondary | ICD-10-CM | POA: Insufficient documentation

## 2013-11-15 LAB — IRON AND TIBC
Iron: 38 ug/dL — ABNORMAL LOW (ref 42–135)
SATURATION RATIOS: 14 % — AB (ref 20–55)
TIBC: 270 ug/dL (ref 215–435)
UIBC: 232 ug/dL (ref 125–400)

## 2013-11-15 LAB — FERRITIN: FERRITIN: 144 ng/mL (ref 22–322)

## 2013-11-15 MED ORDER — CLONIDINE HCL 0.1 MG PO TABS: 0.1000 mg | ORAL_TABLET | Freq: Once | ORAL | Status: AC | PRN

## 2013-11-15 MED ORDER — EPOETIN ALFA 10000 UNIT/ML IJ SOLN: 10000.0000 [IU] | INTRAMUSCULAR | Status: DC

## 2013-11-15 MED ORDER — EPOETIN ALFA 10000 UNIT/ML IJ SOLN
INTRAMUSCULAR | Status: AC
  Administered 2013-11-15: 10000 [IU]
  Filled 2013-11-15: qty 1

## 2013-11-15 NOTE — Discharge Instructions (Signed)
Epoetin Alfa injection What is this medicine? EPOETIN ALFA (e POE e tin AL fa) helps your body make more red blood cells. This medicine is used to treat anemia caused by chronic kidney failure, cancer chemotherapy, or HIV-therapy. It may also be used before surgery if you have anemia. This medicine may be used for other purposes; ask your health care provider or pharmacist if you have questions. COMMON BRAND NAME(S): Epogen, Procrit What should I tell my health care provider before I take this medicine? They need to know if you have any of these conditions: -blood clotting disorders -cancer patient not on chemotherapy -cystic fibrosis -heart disease, such as angina or heart failure -hemoglobin level of 12 g/dL or greater -high blood pressure -low levels of folate, iron, or vitamin B12 -seizures -an unusual or allergic reaction to erythropoietin, albumin, benzyl alcohol, hamster proteins, other medicines, foods, dyes, or preservatives -pregnant or trying to get pregnant -breast-feeding How should I use this medicine? This medicine is for injection into a vein or under the skin. It is usually given by a health care professional in a hospital or clinic setting. If you get this medicine at home, you will be taught how to prepare and give this medicine. Use exactly as directed. Take your medicine at regular intervals. Do not take your medicine more often than directed. It is important that you put your used needles and syringes in a special sharps container. Do not put them in a trash can. If you do not have a sharps container, call your pharmacist or healthcare provider to get one. Talk to your pediatrician regarding the use of this medicine in children. While this drug may be prescribed for selected conditions, precautions do apply. Overdosage: If you think you have taken too much of this medicine contact a poison control center or emergency room at once. NOTE: This medicine is only for you. Do  not share this medicine with others. What if I miss a dose? If you miss a dose, take it as soon as you can. If it is almost time for your next dose, take only that dose. Do not take double or extra doses. What may interact with this medicine? Do not take this medicine with any of the following medications: -darbepoetin alfa This list may not describe all possible interactions. Give your health care provider a list of all the medicines, herbs, non-prescription drugs, or dietary supplements you use. Also tell them if you smoke, drink alcohol, or use illegal drugs. Some items may interact with your medicine. What should I watch for while using this medicine? Visit your prescriber or health care professional for regular checks on your progress and for the needed blood tests and blood pressure measurements. It is especially important for the doctor to make sure your hemoglobin level is in the desired range, to limit the risk of potential side effects and to give you the best benefit. Keep all appointments for any recommended tests. Check your blood pressure as directed. Ask your doctor what your blood pressure should be and when you should contact him or her. As your body makes more red blood cells, you may need to take iron, folic acid, or vitamin B supplements. Ask your doctor or health care provider which products are right for you. If you have kidney disease continue dietary restrictions, even though this medication can make you feel better. Talk with your doctor or health care professional about the foods you eat and the vitamins that you take. What   side effects may I notice from receiving this medicine? Side effects that you should report to your doctor or health care professional as soon as possible: -allergic reactions like skin rash, itching or hives, swelling of the face, lips, or tongue -breathing problems -changes in vision -chest pain -confusion, trouble speaking or understanding -feeling  faint or lightheaded, falls -high blood pressure -muscle aches or pains -pain, swelling, warmth in the leg -rapid weight gain -severe headaches -sudden numbness or weakness of the face, arm or leg -trouble walking, dizziness, loss of balance or coordination -seizures (convulsions) -swelling of the ankles, feet, hands -unusually weak or tired Side effects that usually do not require medical attention (report to your doctor or health care professional if they continue or are bothersome): -diarrhea -fever, chills (flu-like symptoms) -headaches -nausea, vomiting -redness, stinging, or swelling at site where injected This list may not describe all possible side effects. Call your doctor for medical advice about side effects. You may report side effects to FDA at 1-800-FDA-1088. Where should I keep my medicine? Keep out of the reach of children. Store in a refrigerator between 2 and 8 degrees C (36 and 46 degrees F). Do not freeze or shake. Throw away any unused portion if using a single-dose vial. Multi-dose vials can be kept in the refrigerator for up to 21 days after the initial dose. Throw away unused medicine. NOTE: This sheet is a summary. It may not cover all possible information. If you have questions about this medicine, talk to your doctor, pharmacist, or health care provider.  2014, Elsevier/Gold Standard. (2008-06-04 10:25:44)  

## 2013-11-15 NOTE — Progress Notes (Signed)
Pt in to [facility] for first procrit injection.  Initial hemoglobin 7.3 by hemocue.  Repeated with a 7.4 result.  [name] at office notified of results.  Per office notes, Bryan Wilkerson hemoglobin was 7.3 on 11/08/13.  Okay to proceed with shot and schedule pt to return per Dr Jason Nest orders.

## 2013-11-16 ENCOUNTER — Ambulatory Visit (HOSPITAL_COMMUNITY)
Admission: RE | Admit: 2013-11-16 | Discharge: 2013-11-16 | Disposition: A | Discharge status: Home / Self Care | Payer: 59 | Source: Ambulatory Visit | Attending: Vascular Surgery | Admitting: Vascular Surgery

## 2013-11-16 ENCOUNTER — Ambulatory Visit (INDEPENDENT_AMBULATORY_CARE_PROVIDER_SITE_OTHER)
Admission: RE | Admit: 2013-11-16 | Discharge: 2013-11-16 | Disposition: A | Discharge status: Home / Self Care | Payer: 59 | Source: Ambulatory Visit | Attending: Surgery | Admitting: Surgery

## 2013-11-16 ENCOUNTER — Encounter: Payer: Self-pay | Admitting: Vascular Surgery

## 2013-11-16 ENCOUNTER — Ambulatory Visit (INDEPENDENT_AMBULATORY_CARE_PROVIDER_SITE_OTHER): Payer: 59 | Admitting: Vascular Surgery

## 2013-11-16 VITALS — BP 160/87 | HR 74 | Ht 72.0 in | Wt 203.0 lb

## 2013-11-16 DIAGNOSIS — Z992 Dependence on renal dialysis: Secondary | ICD-10-CM

## 2013-11-16 DIAGNOSIS — Z01810 Encounter for preprocedural cardiovascular examination: Secondary | ICD-10-CM

## 2013-11-16 DIAGNOSIS — N186 End stage renal disease: Secondary | ICD-10-CM

## 2013-11-16 DIAGNOSIS — N184 Chronic kidney disease, stage 4 (severe): Secondary | ICD-10-CM

## 2013-11-16 NOTE — Progress Notes (Signed)
Referred by:  Angelica Chessman, MD [place], [place] [address]  Reason for referral: New access  History of Present Illness  Bryan Wilkerson is a 48 y.o. (Nov 10, 1965) male who presents for evaluation for permanent access.  The patient is left hand dominant.  The patient has not had previous access procedures.  Previous central venous cannulation procedures include: none.  The patient has never had a PPM placed.  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  Past Medical History  Diagnosis Date  . Hypertension   . Diabetes mellitus without complication   . Psoriasis   . CHF (congestive heart failure)     Past Surgical History  Procedure Laterality Date  . Cataract extraction w/ intraocular lens  implant, bilateral    . Eye surgery      retina reattachment    History   Social History  . Marital Status: Married    Spouse Name: N/A    Number of Children: N/A  . Years of Education: N/A   Occupational History  . Not on file.   Social History Main Topics  . Smoking status: Never Smoker   . Smokeless tobacco: Never Used  . Alcohol Use: No  . Drug Use: No  . Sexual Activity: Not on file   Other Topics Concern  . Not on file   Social History Narrative  . No narrative on file    Family History  Problem Relation Age of Onset  . Hypertension Mother   . Heart attack Mother    Current Outpatient Prescriptions on File Prior to Visit  Medication Sig Dispense Refill  . amLODipine (NORVASC) 2.5 MG tablet Take 1 tablet (2.5 mg total) by mouth daily.  30 tablet  11  . ferrous sulfate 325 (65 FE) MG tablet Take 1 tablet (325 mg total) by mouth 2 (two) times daily with a meal.  60 tablet  11  . furosemide (LASIX) 20 MG tablet Take 1 tablet (20 mg total) by mouth 2 (two) times daily.  60 tablet  11  . hydrALAZINE (APRESOLINE) 25 MG tablet Take 1 tablet (25 mg total) by mouth every 8 (eight) hours.  90 tablet  11  . insulin aspart  protamine- aspart (NOVOLOG MIX 70/30) (70-30) 100 UNIT/ML injection Inject 0.07 mLs (7 Units total) into the skin 2 (two) times daily.  10 mL  12  . Travoprost, BAK Free, (TRAVATAN) 0.004 % SOLN ophthalmic solution Place 1 drop into both eyes at bedtime.  1 Bottle  11  . adalimumab (HUMIRA) 40 MG/0.8ML injection Inject 40 mg into the skin every 14 (fourteen) days.      . brimonidine (ALPHAGAN) 0.15 % ophthalmic solution Place 1 drop into both eyes 2 (two) times daily.  5 mL  11  . Bromfenac Sodium 0.07 % SOLN Apply 1 drop to eye daily. In left eye  1 Bottle  11  . timolol (TIMOPTIC) 0.5 % ophthalmic solution Place 1 drop into both eyes 2 (two) times daily.  10 mL  11   No current facility-administered medications on file prior to visit.    Allergies  Allergen Reactions  . Penicillins     childhood    REVIEW OF SYSTEMS:  (Positives checked otherwise negative)  CARDIOVASCULAR:  []  chest pain, []  chest pressure, []  palpitations, [x]  shortness of breath when laying flat, [x]  shortness of breath with exertion,  []  pain in feet when walking, []  pain in feet when laying flat, []   history of blood clot in veins (DVT), []  history of phlebitis, [x]  swelling in legs, []  varicose veins  PULMONARY:  []  productive cough, []  asthma, []  wheezing  NEUROLOGIC:  []  weakness in arms or legs, []  numbness in arms or legs, []  difficulty speaking or slurred speech, []  temporary loss of vision in one eye, []  dizziness  HEMATOLOGIC:  []  bleeding problems, []  problems with blood clotting too easily  MUSCULOSKEL:  []  joint pain, []  joint swelling  GASTROINTEST:  []  vomiting blood, []  blood in stool     GENITOURINARY:  []  burning with urination, []  blood in urine  PSYCHIATRIC:  []  history of major depression  INTEGUMENTARY:  [x]  rashes, []  ulcers  CONSTITUTIONAL:  []  fever, []  chills  Physical Examination  Filed Vitals:   11/16/13 1433  BP: 160/87  Pulse: 74  Height: 6' (1.829 m)  Weight: 203 lb  (92.08 kg)  SpO2: 98%   Body mass index is 27.53 kg/(m^2).  General: A&O x 3, WD, WN  Head: [place]/AT  Ear/Nose/Throat: Hearing grossly intact, nares w/o erythema or drainage, oropharynx w/o Erythema/Exudate, Mallampati score: 3  Eyes: PERRLA, EOMI  Neck: Supple, no nuchal rigidity, no palpable LAD  Pulmonary: Sym exp, good air movt, CTAB, no rales, rhonchi, & wheezing  Cardiac: RRR, Nl S1, S2, no Murmurs, rubs or gallops  Vascular: Vessel Right Left  Radial Palpable Palpable  Ulnar Palpable Palpable  Brachial Palpable Palpable  Carotid Palpable, without bruit Palpable, without bruit  Aorta Not palpable N/A  Femoral Palpable Palpable  Popliteal Not palpable Not palpable  PT Palpable Palpable  DP Palpable Palpable   Gastrointestinal: soft, NTND, -G/R, - HSM, - masses, - CVAT B  Musculoskeletal: M/S 5/5 throughout , Extremities without ischemic changes   Neurologic: CN 2-12 intact , Pain and light touch intact in extremities , Motor exam as listed above  Psychiatric: Judgment intact, Mood & affect appropriate for pt's clinical situation  Dermatologic: See M/S exam for extremity exam, no rashes otherwise noted  Lymph : No Cervical, Axillary, or Inguinal lymphadenopathy   Non-Invasive Vascular Imaging  Vein Mapping  (Date: 11/16/2013):   R arm: acceptable vein conduits include possible forearm cephalic, upper arm cephalic, possibly forearm basilic, upper arm basilic  L arm: acceptable vein conduits include possible forearm cephalic, upper arm cephalic, upper arm basilic  BUE Doppler (Date: 11/16/2013):   R arm:   Brachial: tri, 5.6 mm  Radial: tri, 2.3 mm   Ulnar: tri, 1.7 mm  L arm:   Brachial: tri, 5.6 mm  Radial: tri, 2.5 mm  Ulnar: tri, 2.5 mm  Outside Studies/Documentation 6 pages of outside documents were reviewed including: outpatient nephrology chart and labs.  Medical Decision Making  Bryan Wilkerson is a 48 y.o. male who presents with chronic  kidney disease stage IV-V  Based on vein mapping and examination, this patient's permanent access options include: R RC vs BC AVF, R BVT, L BVT, L RC vs BC AVF.  I would start with R RC vs BC AVF  I had an extensive discussion with this patient in regards to the nature of access surgery, including risk, benefits, and alternatives.    The patient is aware that the risks of access surgery include but are not limited to: bleeding, infection, steal syndrome, nerve damage, ischemic monomelic neuropathy, failure of access to mature, and possible need for additional access procedures in the future.  The patient has not agreed to proceed with the above procedure.  He will think  it over before proceeding.    Adele Barthel, MD [facility] Office: [phone] Pager: [phone]  11/16/2013, 5:23 PM

## 2013-11-19 LAB — POCT HEMOGLOBIN-HEMACUE: HEMOGLOBIN: 7.4 g/dL — AB (ref 13.0–17.0)

## 2013-11-20 ENCOUNTER — Other Ambulatory Visit (HOSPITAL_COMMUNITY): Payer: Self-pay | Admitting: *Deleted

## 2013-11-20 ENCOUNTER — Other Ambulatory Visit: Payer: Self-pay

## 2013-11-21 ENCOUNTER — Other Ambulatory Visit: Payer: Self-pay | Admitting: Internal Medicine

## 2013-11-21 MED ORDER — GLUCOSE BLOOD VI STRP: ORAL_STRIP | Status: DC

## 2013-11-22 ENCOUNTER — Encounter (HOSPITAL_COMMUNITY)
Admission: RE | Admit: 2013-11-22 | Discharge: 2013-11-22 | Disposition: A | Discharge status: Home / Self Care | Payer: 59 | Source: Ambulatory Visit | Attending: Nephrology | Admitting: Nephrology

## 2013-11-22 DIAGNOSIS — N179 Acute kidney failure, unspecified: Secondary | ICD-10-CM | POA: Diagnosis not present

## 2013-11-22 LAB — POCT HEMOGLOBIN-HEMACUE: HEMOGLOBIN: 8.5 g/dL — AB (ref 13.0–17.0)

## 2013-11-22 MED ORDER — SODIUM CHLORIDE 0.9 % IV SOLN
510.0000 mg | INTRAVENOUS | Status: DC
  Administered 2013-11-22: 510 mg via INTRAVENOUS
  Filled 2013-11-22: qty 17

## 2013-11-22 MED ORDER — EPOETIN ALFA 10000 UNIT/ML IJ SOLN
INTRAMUSCULAR | Status: AC
Start: 2013-11-22 — End: 2013-11-22
  Filled 2013-11-22: qty 1

## 2013-11-22 MED ORDER — EPOETIN ALFA 10000 UNIT/ML IJ SOLN
10000.0000 [IU] | INTRAMUSCULAR | Status: DC
  Administered 2013-11-22: 10000 [IU] via SUBCUTANEOUS

## 2013-11-22 NOTE — Discharge Instructions (Signed)

## 2013-11-29 ENCOUNTER — Encounter (HOSPITAL_COMMUNITY)
Admission: RE | Admit: 2013-11-29 | Discharge: 2013-11-29 | Disposition: A | Discharge status: Home / Self Care | Payer: 59 | Source: Ambulatory Visit | Attending: Nephrology | Admitting: Nephrology

## 2013-11-29 DIAGNOSIS — N179 Acute kidney failure, unspecified: Secondary | ICD-10-CM | POA: Diagnosis not present

## 2013-11-29 LAB — POCT HEMOGLOBIN-HEMACUE: HEMOGLOBIN: 9 g/dL — AB (ref 13.0–17.0)

## 2013-11-29 MED ORDER — SODIUM CHLORIDE 0.9 % IV SOLN
510.0000 mg | INTRAVENOUS | Status: DC
  Administered 2013-11-29: 510 mg via INTRAVENOUS
  Filled 2013-11-29: qty 17

## 2013-11-29 MED ORDER — EPOETIN ALFA 10000 UNIT/ML IJ SOLN: 10000.0000 [IU] | INTRAMUSCULAR | Status: DC

## 2013-11-29 MED ORDER — EPOETIN ALFA 10000 UNIT/ML IJ SOLN
INTRAMUSCULAR | Status: AC
  Administered 2013-11-29: 10000 [IU] via SUBCUTANEOUS
  Filled 2013-11-29: qty 1

## 2013-12-03 ENCOUNTER — Encounter (HOSPITAL_COMMUNITY): Payer: Self-pay

## 2013-12-04 ENCOUNTER — Encounter (HOSPITAL_COMMUNITY): Payer: Self-pay | Admitting: *Deleted

## 2013-12-04 MED ORDER — VANCOMYCIN HCL IN DEXTROSE 1-5 GM/200ML-% IV SOLN
1000.0000 mg | INTRAVENOUS | Status: AC
  Administered 2013-12-05: 1000 mg via INTRAVENOUS
  Filled 2013-12-04: qty 200

## 2013-12-04 NOTE — Progress Notes (Signed)
Called pt for pre-op call, he was driving but his wife was with him and verified information and received the pre-op instructions. Was on speaker phone and pt was able to hear all of the conversation.

## 2013-12-05 ENCOUNTER — Encounter (HOSPITAL_COMMUNITY): Payer: 59 | Admitting: Anesthesiology

## 2013-12-05 ENCOUNTER — Other Ambulatory Visit: Payer: Self-pay | Admitting: *Deleted

## 2013-12-05 ENCOUNTER — Encounter (HOSPITAL_COMMUNITY)
Admission: RE | Disposition: A | Discharge status: Home / Self Care | Payer: Self-pay | Source: Ambulatory Visit | Attending: Vascular Surgery

## 2013-12-05 ENCOUNTER — Ambulatory Visit (HOSPITAL_COMMUNITY)
Admission: RE | Admit: 2013-12-05 | Discharge: 2013-12-05 | Disposition: A | Discharge status: Home / Self Care | Payer: 59 | Source: Ambulatory Visit | Attending: Vascular Surgery | Admitting: Vascular Surgery

## 2013-12-05 ENCOUNTER — Ambulatory Visit (HOSPITAL_COMMUNITY): Payer: 59 | Admitting: Anesthesiology

## 2013-12-05 ENCOUNTER — Telehealth: Payer: Self-pay | Admitting: Vascular Surgery

## 2013-12-05 ENCOUNTER — Encounter (HOSPITAL_COMMUNITY): Payer: Self-pay | Admitting: *Deleted

## 2013-12-05 ENCOUNTER — Ambulatory Visit (HOSPITAL_COMMUNITY)
Admission: RE | Admit: 2013-12-05 | Discharge: 2013-12-05 | Disposition: A | Discharge status: Home / Self Care | Payer: 59 | Source: Ambulatory Visit | Attending: Nephrology | Admitting: Nephrology

## 2013-12-05 DIAGNOSIS — N058 Unspecified nephritic syndrome with other morphologic changes: Secondary | ICD-10-CM | POA: Insufficient documentation

## 2013-12-05 DIAGNOSIS — L408 Other psoriasis: Secondary | ICD-10-CM | POA: Insufficient documentation

## 2013-12-05 DIAGNOSIS — Z79899 Other long term (current) drug therapy: Secondary | ICD-10-CM | POA: Insufficient documentation

## 2013-12-05 DIAGNOSIS — N184 Chronic kidney disease, stage 4 (severe): Secondary | ICD-10-CM

## 2013-12-05 DIAGNOSIS — E11319 Type 2 diabetes mellitus with unspecified diabetic retinopathy without macular edema: Secondary | ICD-10-CM | POA: Insufficient documentation

## 2013-12-05 DIAGNOSIS — I120 Hypertensive chronic kidney disease with stage 5 chronic kidney disease or end stage renal disease: Secondary | ICD-10-CM | POA: Insufficient documentation

## 2013-12-05 DIAGNOSIS — I509 Heart failure, unspecified: Secondary | ICD-10-CM | POA: Insufficient documentation

## 2013-12-05 DIAGNOSIS — Z880 Allergy status to penicillin: Secondary | ICD-10-CM | POA: Insufficient documentation

## 2013-12-05 DIAGNOSIS — N186 End stage renal disease: Secondary | ICD-10-CM

## 2013-12-05 DIAGNOSIS — E1129 Type 2 diabetes mellitus with other diabetic kidney complication: Secondary | ICD-10-CM | POA: Insufficient documentation

## 2013-12-05 DIAGNOSIS — Z794 Long term (current) use of insulin: Secondary | ICD-10-CM | POA: Insufficient documentation

## 2013-12-05 DIAGNOSIS — N185 Chronic kidney disease, stage 5: Secondary | ICD-10-CM | POA: Insufficient documentation

## 2013-12-05 DIAGNOSIS — E1139 Type 2 diabetes mellitus with other diabetic ophthalmic complication: Secondary | ICD-10-CM | POA: Insufficient documentation

## 2013-12-05 DIAGNOSIS — Z4931 Encounter for adequacy testing for hemodialysis: Secondary | ICD-10-CM

## 2013-12-05 HISTORY — DX: Anemia, unspecified: D64.9

## 2013-12-05 HISTORY — PX: AV FISTULA PLACEMENT: SHX1204

## 2013-12-05 HISTORY — DX: Pneumonia, unspecified organism: J18.9

## 2013-12-05 LAB — POCT I-STAT 4, (NA,K, GLUC, HGB,HCT)
Glucose, Bld: 78 mg/dL (ref 70–99)
HEMATOCRIT: 30 % — AB (ref 39.0–52.0)
HEMOGLOBIN: 10.2 g/dL — AB (ref 13.0–17.0)
Potassium: 4.2 mEq/L (ref 3.7–5.3)
Sodium: 143 mEq/L (ref 137–147)

## 2013-12-05 LAB — GLUCOSE, CAPILLARY
GLUCOSE-CAPILLARY: 103 mg/dL — AB (ref 70–99)
Glucose-Capillary: 77 mg/dL (ref 70–99)

## 2013-12-05 SURGERY — ARTERIOVENOUS (AV) FISTULA CREATION
Anesthesia: Monitor Anesthesia Care | Site: Arm Lower | Laterality: Right

## 2013-12-05 MED ORDER — MIDAZOLAM HCL 5 MG/5ML IJ SOLN
INTRAMUSCULAR | Status: DC | PRN
  Administered 2013-12-05: 2 mg via INTRAVENOUS

## 2013-12-05 MED ORDER — ONDANSETRON HCL 4 MG/2ML IJ SOLN: 4.0000 mg | Freq: Four times a day (QID) | INTRAMUSCULAR | Status: DC | PRN

## 2013-12-05 MED ORDER — CHLORHEXIDINE GLUCONATE CLOTH 2 % EX PADS: 6.0000 | MEDICATED_PAD | Freq: Once | CUTANEOUS | Status: DC

## 2013-12-05 MED ORDER — FENTANYL CITRATE 0.05 MG/ML IJ SOLN
INTRAMUSCULAR | Status: AC
  Filled 2013-12-05: qty 5

## 2013-12-05 MED ORDER — BUPIVACAINE HCL (PF) 0.5 % IJ SOLN
INTRAMUSCULAR | Status: DC | PRN
  Administered 2013-12-05: 30 mL

## 2013-12-05 MED ORDER — SODIUM CHLORIDE 0.9 % IV SOLN
INTRAVENOUS | Status: DC
  Administered 2013-12-05: 08:00:00 via INTRAVENOUS

## 2013-12-05 MED ORDER — PROPOFOL 10 MG/ML IV BOLUS
INTRAVENOUS | Status: AC
  Filled 2013-12-05: qty 20

## 2013-12-05 MED ORDER — EPOETIN ALFA 10000 UNIT/ML IJ SOLN
INTRAMUSCULAR | Status: AC
  Filled 2013-12-05: qty 1

## 2013-12-05 MED ORDER — HYDRALAZINE HCL 20 MG/ML IJ SOLN
10.0000 mg | Freq: Four times a day (QID) | INTRAMUSCULAR | Status: DC | PRN
  Administered 2013-12-05: 10 mg via INTRAVENOUS

## 2013-12-05 MED ORDER — LIDOCAINE-EPINEPHRINE (PF) 1 %-1:200000 IJ SOLN
INTRAMUSCULAR | Status: DC | PRN
  Administered 2013-12-05: 30 mL

## 2013-12-05 MED ORDER — MIDAZOLAM HCL 2 MG/2ML IJ SOLN
INTRAMUSCULAR | Status: AC
  Filled 2013-12-05: qty 2

## 2013-12-05 MED ORDER — 0.9 % SODIUM CHLORIDE (POUR BTL) OPTIME
TOPICAL | Status: DC | PRN
  Administered 2013-12-05: 1000 mL

## 2013-12-05 MED ORDER — PROPOFOL INFUSION 10 MG/ML OPTIME
INTRAVENOUS | Status: DC | PRN
  Administered 2013-12-05: 50 ug/kg/min via INTRAVENOUS

## 2013-12-05 MED ORDER — OXYCODONE HCL 5 MG PO TABS: 5.0000 mg | ORAL_TABLET | Freq: Four times a day (QID) | ORAL | Status: DC | PRN

## 2013-12-05 MED ORDER — EPOETIN ALFA 10000 UNIT/ML IJ SOLN
10000.0000 [IU] | INTRAMUSCULAR | Status: DC
  Administered 2013-12-05: 10000 [IU] via SUBCUTANEOUS

## 2013-12-05 MED ORDER — FENTANYL CITRATE 0.05 MG/ML IJ SOLN: 25.0000 ug | INTRAMUSCULAR | Status: DC | PRN

## 2013-12-05 MED ORDER — HYDRALAZINE HCL 20 MG/ML IJ SOLN
INTRAMUSCULAR | Status: AC
  Filled 2013-12-05: qty 1

## 2013-12-05 MED ORDER — LABETALOL HCL 5 MG/ML IV SOLN
INTRAVENOUS | Status: DC | PRN
  Administered 2013-12-05: 5 mg via INTRAVENOUS

## 2013-12-05 MED ORDER — LIDOCAINE HCL (CARDIAC) 20 MG/ML IV SOLN
INTRAVENOUS | Status: DC | PRN
  Administered 2013-12-05: 50 mg via INTRAVENOUS

## 2013-12-05 MED ORDER — OXYCODONE HCL 5 MG PO TABS: 5.0000 mg | ORAL_TABLET | Freq: Once | ORAL | Status: AC | PRN

## 2013-12-05 MED ORDER — OXYCODONE HCL 5 MG/5ML PO SOLN: 5.0000 mg | Freq: Once | ORAL | Status: AC | PRN

## 2013-12-05 MED ORDER — SODIUM CHLORIDE 0.9 % IR SOLN
Status: DC | PRN
  Administered 2013-12-05: 09:00:00

## 2013-12-05 MED ORDER — FENTANYL CITRATE 0.05 MG/ML IJ SOLN
INTRAMUSCULAR | Status: DC | PRN
  Administered 2013-12-05 (×2): 50 ug via INTRAVENOUS

## 2013-12-05 SURGICAL SUPPLY — 37 items
ARMBAND PINK RESTRICT EXTREMIT (MISCELLANEOUS) ×3 IMPLANT
BLADE 10 SAFETY STRL DISP (BLADE) ×3 IMPLANT
CANISTER SUCTION 2500CC (MISCELLANEOUS) ×3 IMPLANT
CLIP TI MEDIUM 6 (CLIP) ×3 IMPLANT
CLIP TI WIDE RED SMALL 6 (CLIP) ×3 IMPLANT
COVER PROBE W GEL 5X96 (DRAPES) ×3 IMPLANT
COVER SURGICAL LIGHT HANDLE (MISCELLANEOUS) ×3 IMPLANT
DECANTER SPIKE VIAL GLASS SM (MISCELLANEOUS) ×3 IMPLANT
DERMABOND ADVANCED (GAUZE/BANDAGES/DRESSINGS) ×2
DERMABOND ADVANCED .7 DNX12 (GAUZE/BANDAGES/DRESSINGS) ×1 IMPLANT
ELECT REM PT RETURN 9FT ADLT (ELECTROSURGICAL) ×3
ELECTRODE REM PT RTRN 9FT ADLT (ELECTROSURGICAL) ×1 IMPLANT
GLOVE BIO SURGEON STRL SZ 6.5 (GLOVE) ×2 IMPLANT
GLOVE BIO SURGEON STRL SZ7 (GLOVE) ×3 IMPLANT
GLOVE BIO SURGEONS STRL SZ 6.5 (GLOVE) ×1
GLOVE BIOGEL PI IND STRL 6.5 (GLOVE) ×3 IMPLANT
GLOVE BIOGEL PI IND STRL 7.5 (GLOVE) ×1 IMPLANT
GLOVE BIOGEL PI INDICATOR 6.5 (GLOVE) ×6
GLOVE BIOGEL PI INDICATOR 7.5 (GLOVE) ×2
GLOVE ECLIPSE 6.5 STRL STRAW (GLOVE) ×6 IMPLANT
GOWN STRL REUS W/ TWL LRG LVL3 (GOWN DISPOSABLE) ×3 IMPLANT
GOWN STRL REUS W/TWL LRG LVL3 (GOWN DISPOSABLE) ×6
KIT BASIN OR (CUSTOM PROCEDURE TRAY) ×3 IMPLANT
KIT ROOM TURNOVER OR (KITS) ×3 IMPLANT
NS IRRIG 1000ML POUR BTL (IV SOLUTION) ×3 IMPLANT
PACK CV ACCESS (CUSTOM PROCEDURE TRAY) ×3 IMPLANT
PAD ARMBOARD 7.5X6 YLW CONV (MISCELLANEOUS) ×6 IMPLANT
SPONGE SURGIFOAM ABS GEL 100 (HEMOSTASIS) IMPLANT
SUT MNCRL AB 4-0 PS2 18 (SUTURE) ×3 IMPLANT
SUT PROLENE 6 0 BV (SUTURE) ×3 IMPLANT
SUT PROLENE 7 0 BV 1 (SUTURE) ×3 IMPLANT
SUT VIC AB 3-0 SH 27 (SUTURE) ×2
SUT VIC AB 3-0 SH 27X BRD (SUTURE) ×1 IMPLANT
TOWEL OR 17X24 6PK STRL BLUE (TOWEL DISPOSABLE) ×3 IMPLANT
TOWEL OR 17X26 10 PK STRL BLUE (TOWEL DISPOSABLE) ×3 IMPLANT
UNDERPAD 30X30 INCONTINENT (UNDERPADS AND DIAPERS) ×3 IMPLANT
WATER STERILE IRR 1000ML POUR (IV SOLUTION) ×3 IMPLANT

## 2013-12-05 NOTE — H&P (View-Only) (Signed)
Referred by:  Angelica Chessman, MD [place], [place] [address]  Reason for referral: New access  History of Present Illness  Bryan Wilkerson is a 48 y.o. (1965/09/12) male who presents for evaluation for permanent access.  The patient is left hand dominant.  The patient has not had previous access procedures.  Previous central venous cannulation procedures include: none.  The patient has never had a PPM placed.  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  Past Medical History  Diagnosis Date  . Hypertension   . Diabetes mellitus without complication   . Psoriasis   . CHF (congestive heart failure)     Past Surgical History  Procedure Laterality Date  . Cataract extraction w/ intraocular lens  implant, bilateral    . Eye surgery      retina reattachment    History   Social History  . Marital Status: Married    Spouse Name: N/A    Number of Children: N/A  . Years of Education: N/A   Occupational History  . Not on file.   Social History Main Topics  . Smoking status: Never Smoker   . Smokeless tobacco: Never Used  . Alcohol Use: No  . Drug Use: No  . Sexual Activity: Not on file   Other Topics Concern  . Not on file   Social History Narrative  . No narrative on file    Family History  Problem Relation Age of Onset  . Hypertension Mother   . Heart attack Mother    Current Outpatient Prescriptions on File Prior to Visit  Medication Sig Dispense Refill  . amLODipine (NORVASC) 2.5 MG tablet Take 1 tablet (2.5 mg total) by mouth daily.  30 tablet  11  . ferrous sulfate 325 (65 FE) MG tablet Take 1 tablet (325 mg total) by mouth 2 (two) times daily with a meal.  60 tablet  11  . furosemide (LASIX) 20 MG tablet Take 1 tablet (20 mg total) by mouth 2 (two) times daily.  60 tablet  11  . hydrALAZINE (APRESOLINE) 25 MG tablet Take 1 tablet (25 mg total) by mouth every 8 (eight) hours.  90 tablet  11  . insulin aspart  protamine- aspart (NOVOLOG MIX 70/30) (70-30) 100 UNIT/ML injection Inject 0.07 mLs (7 Units total) into the skin 2 (two) times daily.  10 mL  12  . Travoprost, BAK Free, (TRAVATAN) 0.004 % SOLN ophthalmic solution Place 1 drop into both eyes at bedtime.  1 Bottle  11  . adalimumab (HUMIRA) 40 MG/0.8ML injection Inject 40 mg into the skin every 14 (fourteen) days.      . brimonidine (ALPHAGAN) 0.15 % ophthalmic solution Place 1 drop into both eyes 2 (two) times daily.  5 mL  11  . Bromfenac Sodium 0.07 % SOLN Apply 1 drop to eye daily. In left eye  1 Bottle  11  . timolol (TIMOPTIC) 0.5 % ophthalmic solution Place 1 drop into both eyes 2 (two) times daily.  10 mL  11   No current facility-administered medications on file prior to visit.    Allergies  Allergen Reactions  . Penicillins     childhood    REVIEW OF SYSTEMS:  (Positives checked otherwise negative)  CARDIOVASCULAR:  []  chest pain, []  chest pressure, []  palpitations, [x]  shortness of breath when laying flat, [x]  shortness of breath with exertion,  []  pain in feet when walking, []  pain in feet when laying flat, []   history of blood clot in veins (DVT), []  history of phlebitis, [x]  swelling in legs, []  varicose veins  PULMONARY:  []  productive cough, []  asthma, []  wheezing  NEUROLOGIC:  []  weakness in arms or legs, []  numbness in arms or legs, []  difficulty speaking or slurred speech, []  temporary loss of vision in one eye, []  dizziness  HEMATOLOGIC:  []  bleeding problems, []  problems with blood clotting too easily  MUSCULOSKEL:  []  joint pain, []  joint swelling  GASTROINTEST:  []  vomiting blood, []  blood in stool     GENITOURINARY:  []  burning with urination, []  blood in urine  PSYCHIATRIC:  []  history of major depression  INTEGUMENTARY:  [x]  rashes, []  ulcers  CONSTITUTIONAL:  []  fever, []  chills  Physical Examination  Filed Vitals:   11/16/13 1433  BP: 160/87  Pulse: 74  Height: 6' (1.829 m)  Weight: 203 lb  (92.08 kg)  SpO2: 98%   Body mass index is 27.53 kg/(m^2).  General: A&O x 3, WD, WN  Head: [place]/AT  Ear/Nose/Throat: Hearing grossly intact, nares w/o erythema or drainage, oropharynx w/o Erythema/Exudate, Mallampati score: 3  Eyes: PERRLA, EOMI  Neck: Supple, no nuchal rigidity, no palpable LAD  Pulmonary: Sym exp, good air movt, CTAB, no rales, rhonchi, & wheezing  Cardiac: RRR, Nl S1, S2, no Murmurs, rubs or gallops  Vascular: Vessel Right Left  Radial Palpable Palpable  Ulnar Palpable Palpable  Brachial Palpable Palpable  Carotid Palpable, without bruit Palpable, without bruit  Aorta Not palpable N/A  Femoral Palpable Palpable  Popliteal Not palpable Not palpable  PT Palpable Palpable  DP Palpable Palpable   Gastrointestinal: soft, NTND, -G/R, - HSM, - masses, - CVAT B  Musculoskeletal: M/S 5/5 throughout , Extremities without ischemic changes   Neurologic: CN 2-12 intact , Pain and light touch intact in extremities , Motor exam as listed above  Psychiatric: Judgment intact, Mood & affect appropriate for pt's clinical situation  Dermatologic: See M/S exam for extremity exam, no rashes otherwise noted  Lymph : No Cervical, Axillary, or Inguinal lymphadenopathy   Non-Invasive Vascular Imaging  Vein Mapping  (Date: 11/16/2013):   R arm: acceptable vein conduits include possible forearm cephalic, upper arm cephalic, possibly forearm basilic, upper arm basilic  L arm: acceptable vein conduits include possible forearm cephalic, upper arm cephalic, upper arm basilic  BUE Doppler (Date: 11/16/2013):   R arm:   Brachial: tri, 5.6 mm  Radial: tri, 2.3 mm   Ulnar: tri, 1.7 mm  L arm:   Brachial: tri, 5.6 mm  Radial: tri, 2.5 mm  Ulnar: tri, 2.5 mm  Outside Studies/Documentation 6 pages of outside documents were reviewed including: outpatient nephrology chart and labs.  Medical Decision Making  Bryan Wilkerson is a 48 y.o. male who presents with chronic  kidney disease stage IV-V  Based on vein mapping and examination, this patient's permanent access options include: R RC vs BC AVF, R BVT, L BVT, L RC vs BC AVF.  I would start with R RC vs BC AVF  I had an extensive discussion with this patient in regards to the nature of access surgery, including risk, benefits, and alternatives.    The patient is aware that the risks of access surgery include but are not limited to: bleeding, infection, steal syndrome, nerve damage, ischemic monomelic neuropathy, failure of access to mature, and possible need for additional access procedures in the future.  The patient has not agreed to proceed with the above procedure.  He will think  it over before proceeding.    Adele Barthel, MD [facility] Office: [phone] Pager: [phone]  11/16/2013, 5:23 PM

## 2013-12-05 NOTE — Interval H&P Note (Signed)
History and Physical Interval Note:  12/05/2013 8:16 AM  Bryan Wilkerson  has presented today for surgery, with the diagnosis of End stage renal disease  The various methods of treatment have been discussed with the patient and family. After consideration of risks, benefits and other options for treatment, the patient has consented to  Procedure(s): RADIOCEPHALIC VS. BRACHIOCEPHALIC ARTERIOVENOUS (AV) FISTULA CREATION (Right) as a surgical intervention .  The patient's history has been reviewed, patient examined, no change in status, stable for surgery.  I have reviewed the patient's chart and labs.  Questions were answered to the patient's satisfaction.     Conrad [place]

## 2013-12-05 NOTE — Op Note (Signed)
OPERATIVE NOTE   PROCEDURE: right radiocephaic arteriovenous fistula placement  PRE-OPERATIVE DIAGNOSIS: chronic kidney disease stage IV   POST-OPERATIVE DIAGNOSIS: same as above   SURGEON: Conrad Booker, MD  ASSISTANT(S): Silva Bandy, PAC   ANESTHESIA: local and MAC  ESTIMATED BLOOD LOSS: 50 cc  FINDING(S): 1.  Weak thrill with dopplerable radial signal at the end of the case  SPECIMEN(S):  none  INDICATIONS:   Bryan Wilkerson is a 48 y.o. male who presents with chronic kidney disease stage IV.  The patient is scheduled for right radiocephalic arteriovenous fistula placement.  The patient is aware the risks include but are not limited to: bleeding, infection, steal syndrome, nerve damage, ischemic monomelic neuropathy, failure to mature, and need for additional procedures.  The patient is aware of the risks of the procedure and elects to proceed forward.  DESCRIPTION: After full informed written consent was obtained from the patient, the patient was brought back to the operating room and placed supine upon the operating table.  Prior to induction, the patient received IV antibiotics.   After obtaining adequate anesthesia, the patient was then prepped and draped in the standard fashion for a right arm access procedure.  I turned my attention first to identifying the patient's distal cephalic vein and radial artery.  Using SonoSite guidance, the location of these vessels were marked out on the skin.   At this point, I injected local anesthetic to obtain a field block of the wrist.  In total, I injected about 5 mL of a 1:1 mixture of 0.5% Marcaine without epinephrine and 1% lidocaine with epinephrine.  I made a longitudinal incision at the level of the wrist and dissected through the subcutaneous tissue and fascia to gain exposure of the radial artery.  This was noted to be 3 mm in diameter externally.  This was dissected out proximally and distally and controlled with vessel loops .  I  then dissected out the cephalic vein.  This was noted to be 2.0 mm in diameter externally.  The distal segment of the vein was ligated with a  2-0 silk, and the vein was transected.  The proximal segment was iinterrogated with serial dilators.  The vein accepted up to a 3.5 mm dilator without any difficulty.  I then instilled the heparinized saline into the vein and clamped it.  At this point, I reset my exposure of the radial artery and placed the artery under tension proximally and distally.  I made an arteriotomy with a #11 blade, and then I extended the arteriotomy with a Potts scissor.  I injected heparinized saline proximal and distal to this arteriotomy.  The vein was then sewn to the artery in an end-to-side configuration with a running stitch of 7-0 Prolene.  Prior to completing this anastomosis, I allowed the vein and artery to backbleed.  There was no evidence of clot from any vessels.  I completed the anastomosis in the usual fashion and then released all vessel loops and clamps.  There was a weakly palpable thrill in the venous outflow, and there was a dopplerable radial signal.  At this point, I irrigated out the surgical wound.  There was no further active bleeding.  The subcutaneous tissue was reapproximated with a running stitch of 3-0 Vicryl.  The skin was then reapproximated with a running subcuticular stitch of 4-0 Vicryl.  The skin was then cleaned, dried, and reinforced with Dermabond.  The patient tolerated this procedure well.   COMPLICATIONS: none  CONDITION: stable   Conrad Rivergrove, MD 12/05/2013 9:41 AM

## 2013-12-05 NOTE — Telephone Encounter (Addendum)
Message copied by Doristine Section on Wed Dec 05, 2013 11:23 AM ------      Message from: Peter Minium K      Created: Wed Dec 05, 2013 11:03 AM      Regarding: Schedule                   ----- Message -----         From: Alvia Grove, PA-C         Sent: 12/05/2013   9:52 AM           To: [facility] Charge Pool            S/p right radiocephalic AVF [id number]            F/u with Dr. Bridgett Larsson in 6 weeks. He will not need a duplex.            Thanks,      Maudie Mercury ------  notified patient of post op visit on 01-18-14 at 8:30 with dr. Bridgett Larsson

## 2013-12-05 NOTE — Discharge Instructions (Signed)

## 2013-12-05 NOTE — Transfer of Care (Signed)
Immediate Anesthesia Transfer of Care Note  Patient: Bryan Wilkerson  Procedure(s) Performed: Procedure(s): RADIOCEPHALIC VS. BRACHIOCEPHALIC ARTERIOVENOUS (AV) FISTULA CREATION (Right)  Patient Location: PACU  Anesthesia Type:MAC  Level of Consciousness: awake, alert , oriented and patient cooperative  Airway & Oxygen Therapy: Patient Spontanous Breathing  Post-op Assessment: Report given to PACU RN, Post -op Vital signs reviewed and stable and Patient moving all extremities  Post vital signs: Reviewed and stable  Complications: No apparent anesthesia complications

## 2013-12-05 NOTE — Anesthesia Preprocedure Evaluation (Signed)
Anesthesia Evaluation  Patient identified by MRN, date of birth, ID band Patient awake    Reviewed: Allergy & Precautions, H&P , NPO status , Patient's Chart, lab work & pertinent test results  Airway Mallampati: II  Neck ROM: full    Dental   Pulmonary shortness of breath,          Cardiovascular hypertension, +CHF     Neuro/Psych    GI/Hepatic   Endo/Other  diabetes, Type 2  Renal/GU Renal InsufficiencyRenal disease     Musculoskeletal   Abdominal   Peds  Hematology   Anesthesia Other Findings   Reproductive/Obstetrics                           Anesthesia Physical Anesthesia Plan  ASA: III  Anesthesia Plan: MAC   Post-op Pain Management:    Induction: Intravenous  Airway Management Planned: Simple Face Mask  Additional Equipment:   Intra-op Plan:   Post-operative Plan:   Informed Consent: I have reviewed the patients History and Physical, chart, labs and discussed the procedure including the risks, benefits and alternatives for the proposed anesthesia with the patient or authorized representative who has indicated his/her understanding and acceptance.     Plan Discussed with: CRNA, Anesthesiologist and Surgeon  Anesthesia Plan Comments:         Anesthesia Quick Evaluation

## 2013-12-05 NOTE — Anesthesia Postprocedure Evaluation (Signed)
Anesthesia Post Note  Patient: Bryan Wilkerson  Procedure(s) Performed: Procedure(s) (LRB): RADIOCEPHALIC VS. BRACHIOCEPHALIC ARTERIOVENOUS (AV) FISTULA CREATION (Right)  Anesthesia type: MAC  Patient location: PACU  Post pain: Pain level controlled and Adequate analgesia  Post assessment: Post-op Vital signs reviewed, Patient's Cardiovascular Status Stable and Respiratory Function Stable  Last Vitals:  Filed Vitals:   12/05/13 1110  BP: 153/81  Pulse: 79  Temp:   Resp:     Post vital signs: Reviewed and stable  Level of consciousness: awake, alert  and oriented  Complications: No apparent anesthesia complications

## 2013-12-07 ENCOUNTER — Encounter (HOSPITAL_COMMUNITY): Payer: 59

## 2013-12-07 ENCOUNTER — Encounter (HOSPITAL_COMMUNITY): Payer: Self-pay | Admitting: Vascular Surgery

## 2013-12-10 ENCOUNTER — Other Ambulatory Visit (HOSPITAL_COMMUNITY): Payer: 59

## 2013-12-10 ENCOUNTER — Encounter (HOSPITAL_COMMUNITY): Payer: 59

## 2013-12-10 ENCOUNTER — Ambulatory Visit: Payer: 59 | Admitting: Surgery

## 2013-12-14 ENCOUNTER — Encounter (HOSPITAL_COMMUNITY)
Admission: RE | Admit: 2013-12-14 | Discharge: 2013-12-14 | Disposition: A | Discharge status: Home / Self Care | Payer: 59 | Source: Ambulatory Visit | Attending: Nephrology | Admitting: Nephrology

## 2013-12-14 DIAGNOSIS — N179 Acute kidney failure, unspecified: Secondary | ICD-10-CM | POA: Diagnosis present

## 2013-12-14 LAB — IRON AND TIBC
Iron: 32 ug/dL — ABNORMAL LOW (ref 42–135)
SATURATION RATIOS: 14 % — AB (ref 20–55)
TIBC: 226 ug/dL (ref 215–435)
UIBC: 194 ug/dL (ref 125–400)

## 2013-12-14 LAB — FERRITIN: Ferritin: 167 ng/mL (ref 22–322)

## 2013-12-14 LAB — POCT HEMOGLOBIN-HEMACUE: Hemoglobin: 9.7 g/dL — ABNORMAL LOW (ref 13.0–17.0)

## 2013-12-14 MED ORDER — EPOETIN ALFA 10000 UNIT/ML IJ SOLN: 10000.0000 [IU] | INTRAMUSCULAR | Status: DC

## 2013-12-14 MED ORDER — EPOETIN ALFA 10000 UNIT/ML IJ SOLN
INTRAMUSCULAR | Status: AC
  Administered 2013-12-14: 10000 [IU] via SUBCUTANEOUS
  Filled 2013-12-14: qty 1

## 2013-12-20 ENCOUNTER — Encounter (HOSPITAL_COMMUNITY)
Admission: RE | Admit: 2013-12-20 | Discharge: 2013-12-20 | Disposition: A | Discharge status: Home / Self Care | Payer: 59 | Source: Ambulatory Visit | Attending: Nephrology | Admitting: Nephrology

## 2013-12-20 DIAGNOSIS — N179 Acute kidney failure, unspecified: Secondary | ICD-10-CM | POA: Diagnosis not present

## 2013-12-20 LAB — POCT HEMOGLOBIN-HEMACUE: Hemoglobin: 10.3 g/dL — ABNORMAL LOW (ref 13.0–17.0)

## 2013-12-20 MED ORDER — EPOETIN ALFA 10000 UNIT/ML IJ SOLN
INTRAMUSCULAR | Status: AC
  Administered 2013-12-20: 10000 [IU] via SUBCUTANEOUS
  Filled 2013-12-20: qty 1

## 2013-12-20 MED ORDER — EPOETIN ALFA 10000 UNIT/ML IJ SOLN: 10000.0000 [IU] | INTRAMUSCULAR | Status: DC

## 2013-12-21 ENCOUNTER — Encounter (HOSPITAL_COMMUNITY): Payer: 59

## 2013-12-25 ENCOUNTER — Other Ambulatory Visit (HOSPITAL_COMMUNITY): Payer: Self-pay | Admitting: *Deleted

## 2013-12-25 ENCOUNTER — Encounter: Payer: Self-pay | Admitting: Internal Medicine

## 2013-12-26 ENCOUNTER — Encounter (HOSPITAL_COMMUNITY): Payer: 59

## 2013-12-26 ENCOUNTER — Encounter (HOSPITAL_COMMUNITY)
Admission: RE | Admit: 2013-12-26 | Discharge: 2013-12-26 | Disposition: A | Discharge status: Home / Self Care | Payer: 59 | Source: Ambulatory Visit | Attending: Nephrology | Admitting: Nephrology

## 2013-12-26 DIAGNOSIS — N179 Acute kidney failure, unspecified: Secondary | ICD-10-CM | POA: Diagnosis not present

## 2013-12-26 LAB — POCT HEMOGLOBIN-HEMACUE: Hemoglobin: 10 g/dL — ABNORMAL LOW (ref 13.0–17.0)

## 2013-12-26 MED ORDER — SODIUM CHLORIDE 0.9 % IV SOLN
510.0000 mg | INTRAVENOUS | Status: DC
  Administered 2013-12-26: 510 mg via INTRAVENOUS
  Filled 2013-12-26: qty 17

## 2013-12-26 MED ORDER — EPOETIN ALFA 10000 UNIT/ML IJ SOLN
INTRAMUSCULAR | Status: AC
  Administered 2013-12-26: 10000 [IU] via SUBCUTANEOUS
  Filled 2013-12-26: qty 1

## 2013-12-26 MED ORDER — EPOETIN ALFA 10000 UNIT/ML IJ SOLN
10000.0000 [IU] | INTRAMUSCULAR | Status: DC
  Administered 2013-12-26: 10000 [IU] via SUBCUTANEOUS

## 2014-01-02 ENCOUNTER — Encounter (HOSPITAL_COMMUNITY)
Admission: RE | Admit: 2014-01-02 | Discharge: 2014-01-02 | Disposition: A | Discharge status: Home / Self Care | Payer: 59 | Source: Ambulatory Visit | Attending: Nephrology | Admitting: Nephrology

## 2014-01-02 DIAGNOSIS — N179 Acute kidney failure, unspecified: Secondary | ICD-10-CM | POA: Insufficient documentation

## 2014-01-02 MED ORDER — EPOETIN ALFA 10000 UNIT/ML IJ SOLN: 10000.0000 [IU] | INTRAMUSCULAR | Status: DC

## 2014-01-02 MED ORDER — EPOETIN ALFA 10000 UNIT/ML IJ SOLN
INTRAMUSCULAR | Status: AC
  Administered 2014-01-02: 10000 [IU] via SUBCUTANEOUS
  Filled 2014-01-02: qty 1

## 2014-01-02 MED ORDER — SODIUM CHLORIDE 0.9 % IV SOLN
510.0000 mg | INTRAVENOUS | Status: DC
  Administered 2014-01-02: 510 mg via INTRAVENOUS
  Filled 2014-01-02: qty 17

## 2014-01-02 NOTE — Progress Notes (Signed)
hemocue and ipth done today at [facility] per Jan. Hemocue result was 11.2

## 2014-01-09 ENCOUNTER — Encounter (HOSPITAL_COMMUNITY)
Admission: RE | Admit: 2014-01-09 | Discharge: 2014-01-09 | Disposition: A | Discharge status: Home / Self Care | Payer: 59 | Source: Ambulatory Visit | Attending: Nephrology | Admitting: Nephrology

## 2014-01-09 DIAGNOSIS — N179 Acute kidney failure, unspecified: Secondary | ICD-10-CM | POA: Diagnosis not present

## 2014-01-09 MED ORDER — EPOETIN ALFA 10000 UNIT/ML IJ SOLN
INTRAMUSCULAR | Status: AC
  Administered 2014-01-09: 10000 [IU]
  Filled 2014-01-09: qty 1

## 2014-01-09 MED ORDER — EPOETIN ALFA 10000 UNIT/ML IJ SOLN: 10000.0000 [IU] | INTRAMUSCULAR | Status: DC

## 2014-01-10 LAB — POCT HEMOGLOBIN-HEMACUE: HEMOGLOBIN: 11.2 g/dL — AB (ref 13.0–17.0)

## 2014-01-16 ENCOUNTER — Encounter (HOSPITAL_COMMUNITY)
Admission: RE | Admit: 2014-01-16 | Discharge: 2014-01-16 | Disposition: A | Discharge status: Home / Self Care | Payer: 59 | Source: Ambulatory Visit | Attending: Nephrology | Admitting: Nephrology

## 2014-01-16 DIAGNOSIS — N179 Acute kidney failure, unspecified: Secondary | ICD-10-CM | POA: Diagnosis not present

## 2014-01-16 LAB — POCT HEMOGLOBIN-HEMACUE: Hemoglobin: 11.5 g/dL — ABNORMAL LOW (ref 13.0–17.0)

## 2014-01-16 MED ORDER — EPOETIN ALFA 10000 UNIT/ML IJ SOLN
INTRAMUSCULAR | Status: AC
  Administered 2014-01-16: 10000 [IU] via SUBCUTANEOUS
  Filled 2014-01-16: qty 1

## 2014-01-16 MED ORDER — EPOETIN ALFA 10000 UNIT/ML IJ SOLN: 10000.0000 [IU] | INTRAMUSCULAR | Status: DC

## 2014-01-17 ENCOUNTER — Encounter: Payer: Self-pay | Admitting: Vascular Surgery

## 2014-01-17 LAB — IRON AND TIBC
IRON: 44 ug/dL (ref 42–135)
Saturation Ratios: 21 % (ref 20–55)
TIBC: 208 ug/dL — AB (ref 215–435)
UIBC: 164 ug/dL (ref 125–400)

## 2014-01-17 LAB — FERRITIN: Ferritin: 199 ng/mL (ref 22–322)

## 2014-01-18 ENCOUNTER — Ambulatory Visit (INDEPENDENT_AMBULATORY_CARE_PROVIDER_SITE_OTHER): Payer: Self-pay | Admitting: Vascular Surgery

## 2014-01-18 ENCOUNTER — Encounter: Payer: Self-pay | Admitting: Vascular Surgery

## 2014-01-18 VITALS — BP 162/97 | HR 112 | Resp 14 | Ht 74.0 in | Wt 188.0 lb

## 2014-01-18 DIAGNOSIS — N184 Chronic kidney disease, stage 4 (severe): Secondary | ICD-10-CM

## 2014-01-18 NOTE — Progress Notes (Signed)
    Postoperative Access Visit   History of Present Illness  Bryan Wilkerson is a 48 y.o. year old male who presents for postoperative follow-up for: R RC AVF (Date: 12/05/13).  The patient's wounds are healed.  The patient notes no steal symptoms.  The patient is able to complete their activities of daily living.  The patient's current symptoms are: none.  Pt is nearing ESRD but not quite there per the pt.  For VQI Use Only  PRE-ADM LIVING: Home  AMB STATUS: Ambulatory  Physical Examination Filed Vitals:   01/18/14 0852  BP: 162/97  Pulse: 112  Resp: 14    RUE: Incision is healed, skin feels warmnd grip is 55, sensation in digits is intact, palpable thrill, bruit can be auscultated , fistula is somewhat visible, on Sonosite: 5-6 mm with at least 3 significant side branches  Medical Decision Making  Bryan Wilkerson is a 48 y.o. year old male who presents s/p R RC AVF  I would let the fistula mature another month before considering side branch ligation.  Pt will follow in 1 month.  Thank you for allowing [place] to participate in this patient's care.  Adele Barthel, MD [facility] Office: [phone] Pager: [phone]  01/18/2014, 9:08 AM

## 2014-01-23 ENCOUNTER — Encounter (HOSPITAL_COMMUNITY): Payer: 59

## 2014-01-25 ENCOUNTER — Encounter (HOSPITAL_COMMUNITY)
Admission: RE | Admit: 2014-01-25 | Discharge: 2014-01-25 | Disposition: A | Discharge status: Home / Self Care | Payer: 59 | Source: Ambulatory Visit | Attending: Nephrology | Admitting: Nephrology

## 2014-01-25 DIAGNOSIS — N179 Acute kidney failure, unspecified: Secondary | ICD-10-CM | POA: Diagnosis not present

## 2014-01-25 LAB — POCT HEMOGLOBIN-HEMACUE: HEMOGLOBIN: 11.2 g/dL — AB (ref 13.0–17.0)

## 2014-01-25 MED ORDER — EPOETIN ALFA 10000 UNIT/ML IJ SOLN
10000.0000 [IU] | INTRAMUSCULAR | Status: DC
  Administered 2014-01-25: 10000 [IU] via SUBCUTANEOUS

## 2014-01-25 MED ORDER — EPOETIN ALFA 10000 UNIT/ML IJ SOLN
INTRAMUSCULAR | Status: AC
  Filled 2014-01-25: qty 1

## 2014-02-01 ENCOUNTER — Encounter (HOSPITAL_COMMUNITY)
Admission: RE | Admit: 2014-02-01 | Discharge: 2014-02-01 | Disposition: A | Discharge status: Home / Self Care | Payer: 59 | Source: Ambulatory Visit | Attending: Nephrology | Admitting: Nephrology

## 2014-02-01 DIAGNOSIS — N179 Acute kidney failure, unspecified: Secondary | ICD-10-CM | POA: Diagnosis not present

## 2014-02-01 LAB — POCT HEMOGLOBIN-HEMACUE: Hemoglobin: 12.2 g/dL — ABNORMAL LOW (ref 13.0–17.0)

## 2014-02-01 MED ORDER — EPOETIN ALFA 10000 UNIT/ML IJ SOLN: 10000.0000 [IU] | INTRAMUSCULAR | Status: DC

## 2014-02-01 MED ORDER — SODIUM CHLORIDE 0.9 % IV SOLN
1020.0000 mg | Freq: Once | INTRAVENOUS | Status: AC
  Administered 2014-02-01: 1020 mg via INTRAVENOUS
  Filled 2014-02-01: qty 34

## 2014-02-14 ENCOUNTER — Other Ambulatory Visit (HOSPITAL_COMMUNITY): Payer: Self-pay | Admitting: *Deleted

## 2014-02-15 ENCOUNTER — Encounter (HOSPITAL_COMMUNITY)
Admission: RE | Admit: 2014-02-15 | Discharge: 2014-02-15 | Disposition: A | Discharge status: Home / Self Care | Payer: 59 | Source: Ambulatory Visit | Attending: Nephrology | Admitting: Nephrology

## 2014-02-15 DIAGNOSIS — N179 Acute kidney failure, unspecified: Secondary | ICD-10-CM | POA: Insufficient documentation

## 2014-02-15 LAB — POCT HEMOGLOBIN-HEMACUE: Hemoglobin: 12.2 g/dL — ABNORMAL LOW (ref 13.0–17.0)

## 2014-02-15 MED ORDER — EPOETIN ALFA 10000 UNIT/ML IJ SOLN: 10000.0000 [IU] | INTRAMUSCULAR | Status: DC

## 2014-02-21 ENCOUNTER — Encounter: Payer: Self-pay | Admitting: Vascular Surgery

## 2014-02-22 ENCOUNTER — Encounter: Payer: Self-pay | Admitting: Vascular Surgery

## 2014-02-22 ENCOUNTER — Ambulatory Visit (INDEPENDENT_AMBULATORY_CARE_PROVIDER_SITE_OTHER): Payer: Self-pay | Admitting: Vascular Surgery

## 2014-02-22 VITALS — BP 148/87 | HR 92 | Temp 97.4°F | Resp 16 | Ht 74.0 in | Wt 185.0 lb

## 2014-02-22 DIAGNOSIS — Z48812 Encounter for surgical aftercare following surgery on the circulatory system: Secondary | ICD-10-CM

## 2014-02-22 DIAGNOSIS — N184 Chronic kidney disease, stage 4 (severe): Secondary | ICD-10-CM

## 2014-02-22 NOTE — Progress Notes (Signed)
   Postoperative Access Visit  History of Present Illness  Bryan Wilkerson is a 48 y.o. male  who presents for postoperative follow-up for: R RC AVF (Date: 12/05/13). The patient's wounds are healed. The patient notes no steal symptoms. The patient is able to complete their activities of daily living. The patient's current symptoms are: none. Pt again nearly ESRD: GFR 7.   For VQI Use Only  PRE-ADM LIVING: Home   AMB STATUS: Ambulatory   Physical Examination  Filed Vitals:   02/22/14 0916  BP: 148/87  Pulse: 92  Temp: 97.4 F (36.3 C)  TempSrc: Oral  Resp: 16  Height: 6\' 2"  (1.88 m)  Weight: 185 lb (83.915 kg)  SpO2: 98%    RUE: Incision is healed, skin feels warm, hand grip is 55, sensation in digits is intact, palpable thrill, bruit can be auscultated , fistula is somewhat visible, on Sonosite: 4.5-5.5 mm with at least 3 significant side branches   Medical Decision Making  Bryan Wilkerson is a 48 y.o. male  who presents s/p R RC AVF   Given this patient's nearing ESRD, I would recommend a R arm fistulogram with possible immediate side branch ligation in the hybrid OR. The patient is going to consider doing such. Thank you for allowing [place] to participate in this patient's care.   Adele Barthel, MD [facility] Office: [phone] Pager: [phone]  02/22/2014, 9:42 AM

## 2014-02-26 ENCOUNTER — Ambulatory Visit: Payer: 59 | Admitting: Internal Medicine

## 2014-03-01 ENCOUNTER — Encounter (HOSPITAL_COMMUNITY)
Admission: RE | Admit: 2014-03-01 | Discharge: 2014-03-01 | Disposition: A | Discharge status: Home / Self Care | Payer: 59 | Source: Ambulatory Visit | Attending: Nephrology | Admitting: Nephrology

## 2014-03-01 DIAGNOSIS — N179 Acute kidney failure, unspecified: Secondary | ICD-10-CM | POA: Diagnosis not present

## 2014-03-01 LAB — RENAL FUNCTION PANEL
Albumin: 2.6 g/dL — ABNORMAL LOW (ref 3.5–5.2)
Anion gap: 16 — ABNORMAL HIGH (ref 5–15)
BUN: 103 mg/dL — AB (ref 6–23)
CALCIUM: 7.4 mg/dL — AB (ref 8.4–10.5)
CO2: 15 mEq/L — ABNORMAL LOW (ref 19–32)
Chloride: 104 mEq/L (ref 96–112)
Creatinine, Ser: 11.29 mg/dL — ABNORMAL HIGH (ref 0.50–1.35)
GFR calc Af Amer: 5 mL/min — ABNORMAL LOW (ref >90)
GFR calc non Af Amer: 5 mL/min — ABNORMAL LOW (ref >90)
GLUCOSE: 122 mg/dL — AB (ref 70–99)
Phosphorus: 4.7 mg/dL — ABNORMAL HIGH (ref 2.3–4.6)
Potassium: 6.1 mEq/L — ABNORMAL HIGH (ref 3.7–5.3)
Sodium: 135 mEq/L — ABNORMAL LOW (ref 137–147)

## 2014-03-01 LAB — FERRITIN: FERRITIN: 710 ng/mL — AB (ref 22–322)

## 2014-03-01 LAB — IRON AND TIBC
IRON: 119 ug/dL (ref 42–135)
SATURATION RATIOS: 60 % — AB (ref 20–55)
TIBC: 200 ug/dL — AB (ref 215–435)
UIBC: 81 ug/dL — AB (ref 125–400)

## 2014-03-01 LAB — HEPATITIS B SURFACE ANTIGEN: Hepatitis B Surface Ag: NEGATIVE

## 2014-03-01 LAB — POCT HEMOGLOBIN-HEMACUE: Hemoglobin: 12.4 g/dL — ABNORMAL LOW (ref 13.0–17.0)

## 2014-03-01 MED ORDER — EPOETIN ALFA 10000 UNIT/ML IJ SOLN: 10000.0000 [IU] | INTRAMUSCULAR | Status: DC

## 2014-03-04 LAB — PTH, INTACT AND CALCIUM
Calcium, Total (PTH): 7.4 mg/dL — ABNORMAL LOW (ref 8.4–10.5)
PTH: 346 pg/mL — ABNORMAL HIGH (ref 14–64)

## 2014-03-15 ENCOUNTER — Encounter (HOSPITAL_COMMUNITY): Payer: 59

## 2014-03-29 ENCOUNTER — Inpatient Hospital Stay (HOSPITAL_COMMUNITY): Admission: RE | Admit: 2014-03-29 | Payer: 59 | Source: Ambulatory Visit

## 2014-04-25 ENCOUNTER — Ambulatory Visit (INDEPENDENT_AMBULATORY_CARE_PROVIDER_SITE_OTHER): Payer: 59 | Admitting: Internal Medicine

## 2014-04-25 NOTE — Progress Notes (Signed)
Patient ID: Bryan Wilkerson, male   DOB: Jul 02, 1966, 48 y.o.   MRN: [id number] No show

## 2014-05-01 ENCOUNTER — Other Ambulatory Visit: Payer: Self-pay | Admitting: Internal Medicine

## 2014-05-12 ENCOUNTER — Other Ambulatory Visit: Payer: Self-pay | Admitting: Internal Medicine

## 2014-05-28 DIAGNOSIS — Z992 Dependence on renal dialysis: Secondary | ICD-10-CM | POA: Insufficient documentation

## 2014-05-28 DIAGNOSIS — Z8679 Personal history of other diseases of the circulatory system: Secondary | ICD-10-CM | POA: Insufficient documentation

## 2014-05-28 DIAGNOSIS — H269 Unspecified cataract: Secondary | ICD-10-CM | POA: Insufficient documentation

## 2014-06-03 ENCOUNTER — Other Ambulatory Visit: Payer: Self-pay

## 2014-06-10 MED ORDER — VANCOMYCIN HCL IN DEXTROSE 1-5 GM/200ML-% IV SOLN
1000.0000 mg | INTRAVENOUS | Status: DC
  Filled 2014-06-10: qty 200

## 2014-06-10 NOTE — Progress Notes (Signed)
I spoke with Mr Jolly who reported that he was trying to get in touch with "somebody" because he has a family emergency and will not be able to come tomorrow. I paged Dr.Chen and he returned call and I informed him of this.

## 2014-06-11 ENCOUNTER — Ambulatory Visit (HOSPITAL_COMMUNITY): Admission: RE | Admit: 2014-06-11 | Payer: 59 | Source: Ambulatory Visit | Admitting: Vascular Surgery

## 2014-06-11 ENCOUNTER — Encounter (HOSPITAL_COMMUNITY): Payer: Self-pay | Admitting: Anesthesiology

## 2014-06-11 ENCOUNTER — Encounter (HOSPITAL_COMMUNITY): Admission: RE | Payer: Self-pay | Source: Ambulatory Visit

## 2014-06-11 SURGERY — FISTULOGRAM
Anesthesia: Monitor Anesthesia Care | Site: Arm Lower | Laterality: Right

## 2014-06-18 ENCOUNTER — Other Ambulatory Visit: Payer: Self-pay

## 2014-07-05 DIAGNOSIS — J189 Pneumonia, unspecified organism: Secondary | ICD-10-CM

## 2014-07-05 HISTORY — DX: Pneumonia, unspecified organism: J18.9

## 2014-07-15 ENCOUNTER — Encounter (HOSPITAL_COMMUNITY): Payer: Self-pay | Admitting: *Deleted

## 2014-07-15 MED ORDER — VANCOMYCIN HCL IN DEXTROSE 1-5 GM/200ML-% IV SOLN
1000.0000 mg | INTRAVENOUS | Status: AC
  Administered 2014-07-16: 1000 mg via INTRAVENOUS
  Filled 2014-07-15: qty 200

## 2014-07-15 MED ORDER — SODIUM CHLORIDE 0.9 % IV SOLN: INTRAVENOUS | Status: DC

## 2014-07-15 MED ORDER — CHLORHEXIDINE GLUCONATE CLOTH 2 % EX PADS: 6.0000 | MEDICATED_PAD | Freq: Once | CUTANEOUS | Status: DC

## 2014-07-16 ENCOUNTER — Encounter (HOSPITAL_COMMUNITY)
Admission: RE | Disposition: A | Discharge status: Home / Self Care | Payer: Self-pay | Source: Ambulatory Visit | Attending: Vascular Surgery

## 2014-07-16 ENCOUNTER — Ambulatory Visit (HOSPITAL_COMMUNITY): Payer: 59 | Admitting: Anesthesiology

## 2014-07-16 ENCOUNTER — Ambulatory Visit (HOSPITAL_COMMUNITY)
Admission: RE | Admit: 2014-07-16 | Discharge: 2014-07-16 | Disposition: A | Discharge status: Home / Self Care | Payer: 59 | Source: Ambulatory Visit | Attending: Vascular Surgery | Admitting: Vascular Surgery

## 2014-07-16 ENCOUNTER — Encounter (HOSPITAL_COMMUNITY): Payer: Self-pay | Admitting: *Deleted

## 2014-07-16 DIAGNOSIS — H3500 Unspecified background retinopathy: Secondary | ICD-10-CM | POA: Diagnosis not present

## 2014-07-16 DIAGNOSIS — L409 Psoriasis, unspecified: Secondary | ICD-10-CM | POA: Diagnosis not present

## 2014-07-16 DIAGNOSIS — N189 Chronic kidney disease, unspecified: Secondary | ICD-10-CM | POA: Insufficient documentation

## 2014-07-16 DIAGNOSIS — E109 Type 1 diabetes mellitus without complications: Secondary | ICD-10-CM | POA: Insufficient documentation

## 2014-07-16 DIAGNOSIS — Z79899 Other long term (current) drug therapy: Secondary | ICD-10-CM | POA: Diagnosis not present

## 2014-07-16 DIAGNOSIS — Z992 Dependence on renal dialysis: Secondary | ICD-10-CM | POA: Insufficient documentation

## 2014-07-16 DIAGNOSIS — Z794 Long term (current) use of insulin: Secondary | ICD-10-CM | POA: Insufficient documentation

## 2014-07-16 DIAGNOSIS — T82898A Other specified complication of vascular prosthetic devices, implants and grafts, initial encounter: Secondary | ICD-10-CM | POA: Diagnosis present

## 2014-07-16 DIAGNOSIS — D649 Anemia, unspecified: Secondary | ICD-10-CM | POA: Diagnosis not present

## 2014-07-16 DIAGNOSIS — I129 Hypertensive chronic kidney disease with stage 1 through stage 4 chronic kidney disease, or unspecified chronic kidney disease: Secondary | ICD-10-CM | POA: Insufficient documentation

## 2014-07-16 DIAGNOSIS — Y832 Surgical operation with anastomosis, bypass or graft as the cause of abnormal reaction of the patient, or of later complication, without mention of misadventure at the time of the procedure: Secondary | ICD-10-CM | POA: Insufficient documentation

## 2014-07-16 DIAGNOSIS — Y92234 Operating room of hospital as the place of occurrence of the external cause: Secondary | ICD-10-CM | POA: Diagnosis not present

## 2014-07-16 DIAGNOSIS — I509 Heart failure, unspecified: Secondary | ICD-10-CM | POA: Diagnosis not present

## 2014-07-16 HISTORY — PX: LIGATION OF COMPETING BRANCHES OF ARTERIOVENOUS FISTULA: SHX5949

## 2014-07-16 HISTORY — PX: FISTULOGRAM: SHX5832

## 2014-07-16 LAB — POCT I-STAT 4, (NA,K, GLUC, HGB,HCT)
Glucose, Bld: 313 mg/dL — ABNORMAL HIGH (ref 70–99)
HEMATOCRIT: 43 % (ref 39.0–52.0)
HEMOGLOBIN: 14.6 g/dL (ref 13.0–17.0)
POTASSIUM: 5 mmol/L (ref 3.5–5.1)
Sodium: 139 mmol/L (ref 135–145)

## 2014-07-16 LAB — GLUCOSE, CAPILLARY
Glucose-Capillary: 285 mg/dL — ABNORMAL HIGH (ref 70–99)
Glucose-Capillary: 177 mg/dL — ABNORMAL HIGH (ref 70–99)

## 2014-07-16 SURGERY — FISTULOGRAM
Anesthesia: Monitor Anesthesia Care | Laterality: Right

## 2014-07-16 MED ORDER — 0.9 % SODIUM CHLORIDE (POUR BTL) OPTIME
TOPICAL | Status: DC | PRN
  Administered 2014-07-16: 1000 mL

## 2014-07-16 MED ORDER — BUPIVACAINE HCL 0.5 % IJ SOLN
INTRAMUSCULAR | Status: DC | PRN
  Administered 2014-07-16: 2 mL

## 2014-07-16 MED ORDER — INSULIN ASPART 100 UNIT/ML ~~LOC~~ SOLN
6.0000 [IU] | SUBCUTANEOUS | Status: DC
  Filled 2014-07-16: qty 0.06

## 2014-07-16 MED ORDER — OXYCODONE HCL 5 MG PO TABS: 5.0000 mg | ORAL_TABLET | Freq: Four times a day (QID) | ORAL | Status: DC | PRN

## 2014-07-16 MED ORDER — MIDAZOLAM HCL 2 MG/2ML IJ SOLN
INTRAMUSCULAR | Status: AC
  Filled 2014-07-16: qty 2

## 2014-07-16 MED ORDER — LIDOCAINE HCL (CARDIAC) 20 MG/ML IV SOLN
INTRAVENOUS | Status: DC | PRN
  Administered 2014-07-16: 60 mg via INTRAVENOUS

## 2014-07-16 MED ORDER — SODIUM CHLORIDE 0.9 % IV SOLN
INTRAVENOUS | Status: DC | PRN
  Administered 2014-07-16: 07:00:00 via INTRAVENOUS

## 2014-07-16 MED ORDER — PROPOFOL 10 MG/ML IV BOLUS
INTRAVENOUS | Status: AC
  Filled 2014-07-16: qty 20

## 2014-07-16 MED ORDER — SODIUM CHLORIDE 0.9 % IR SOLN
Status: DC | PRN
  Administered 2014-07-16: 09:00:00

## 2014-07-16 MED ORDER — LIDOCAINE-EPINEPHRINE (PF) 1 %-1:200000 IJ SOLN
INTRAMUSCULAR | Status: AC
  Filled 2014-07-16: qty 10

## 2014-07-16 MED ORDER — LIDOCAINE HCL (CARDIAC) 20 MG/ML IV SOLN
INTRAVENOUS | Status: AC
  Filled 2014-07-16: qty 5

## 2014-07-16 MED ORDER — ROCURONIUM BROMIDE 50 MG/5ML IV SOLN
INTRAVENOUS | Status: AC
  Filled 2014-07-16: qty 1

## 2014-07-16 MED ORDER — MIDAZOLAM HCL 5 MG/5ML IJ SOLN
INTRAMUSCULAR | Status: DC | PRN
Start: 2014-07-16 — End: 2014-07-16
  Administered 2014-07-16: 2 mg via INTRAVENOUS

## 2014-07-16 MED ORDER — ONDANSETRON HCL 4 MG/2ML IJ SOLN
INTRAMUSCULAR | Status: AC
  Filled 2014-07-16: qty 2

## 2014-07-16 MED ORDER — LABETALOL HCL 5 MG/ML IV SOLN
INTRAVENOUS | Status: DC | PRN
  Administered 2014-07-16 (×2): 5 mg via INTRAVENOUS

## 2014-07-16 MED ORDER — PROPOFOL 10 MG/ML IV BOLUS
INTRAVENOUS | Status: DC | PRN
  Administered 2014-07-16 (×4): 20 mg via INTRAVENOUS

## 2014-07-16 MED ORDER — SODIUM CHLORIDE 0.9 % IJ SOLN
INTRAMUSCULAR | Status: AC
  Filled 2014-07-16: qty 10

## 2014-07-16 MED ORDER — PHENYLEPHRINE 40 MCG/ML (10ML) SYRINGE FOR IV PUSH (FOR BLOOD PRESSURE SUPPORT)
PREFILLED_SYRINGE | INTRAVENOUS | Status: AC
  Filled 2014-07-16: qty 10

## 2014-07-16 MED ORDER — INSULIN ASPART 100 UNIT/ML ~~LOC~~ SOLN
5.0000 [IU] | SUBCUTANEOUS | Status: AC
  Administered 2014-07-16: 5 [IU] via INTRAVENOUS
  Filled 2014-07-16: qty 0.05

## 2014-07-16 MED ORDER — ONDANSETRON HCL 4 MG/2ML IJ SOLN
INTRAMUSCULAR | Status: DC | PRN
  Administered 2014-07-16: 4 mg via INTRAVENOUS

## 2014-07-16 MED ORDER — THROMBIN 20000 UNITS EX SOLR
CUTANEOUS | Status: AC
  Filled 2014-07-16: qty 20000

## 2014-07-16 MED ORDER — LABETALOL HCL 5 MG/ML IV SOLN
INTRAVENOUS | Status: AC
Start: 2014-07-16 — End: 2014-07-16
  Filled 2014-07-16: qty 4

## 2014-07-16 MED ORDER — FENTANYL CITRATE 0.05 MG/ML IJ SOLN
INTRAMUSCULAR | Status: DC | PRN
  Administered 2014-07-16: 100 ug via INTRAVENOUS
  Administered 2014-07-16 (×2): 50 ug via INTRAVENOUS

## 2014-07-16 MED ORDER — EPHEDRINE SULFATE 50 MG/ML IJ SOLN
INTRAMUSCULAR | Status: AC
  Filled 2014-07-16: qty 1

## 2014-07-16 MED ORDER — IODIXANOL 320 MG/ML IV SOLN
INTRAVENOUS | Status: DC | PRN
  Administered 2014-07-16: 25 mL via INTRAVENOUS

## 2014-07-16 MED ORDER — FENTANYL CITRATE 0.05 MG/ML IJ SOLN
INTRAMUSCULAR | Status: AC
  Filled 2014-07-16: qty 5

## 2014-07-16 MED ORDER — LIDOCAINE-EPINEPHRINE (PF) 1 %-1:200000 IJ SOLN
INTRAMUSCULAR | Status: DC | PRN
  Administered 2014-07-16: 2 mL

## 2014-07-16 SURGICAL SUPPLY — 49 items
BAG BANDED W/RUBBER/TAPE 36X54 (MISCELLANEOUS) ×3 IMPLANT
BLADE SURG 10 STRL SS (BLADE) ×3 IMPLANT
CANISTER SUCTION 2500CC (MISCELLANEOUS) ×3 IMPLANT
CLIP TI MEDIUM 24 (CLIP) ×3 IMPLANT
CLIP TI WIDE RED SMALL 24 (CLIP) ×3 IMPLANT
COVER DOME SNAP 22 D (MISCELLANEOUS) ×3 IMPLANT
COVER PROBE W GEL 5X96 (DRAPES) ×3 IMPLANT
COVER SURGICAL LIGHT HANDLE (MISCELLANEOUS) ×3 IMPLANT
DRSG TEGADERM 4X4.75 (GAUZE/BANDAGES/DRESSINGS) ×3 IMPLANT
ELECT REM PT RETURN 9FT ADLT (ELECTROSURGICAL) ×3
ELECTRODE REM PT RTRN 9FT ADLT (ELECTROSURGICAL) ×1 IMPLANT
GAUZE SPONGE 4X4 12PLY STRL (GAUZE/BANDAGES/DRESSINGS) IMPLANT
GEL ULTRASOUND 20GR AQUASONIC (MISCELLANEOUS) IMPLANT
GLOVE BIO SURGEON STRL SZ7 (GLOVE) ×3 IMPLANT
GLOVE BIOGEL PI IND STRL 6.5 (GLOVE) ×2 IMPLANT
GLOVE BIOGEL PI IND STRL 7.5 (GLOVE) ×4 IMPLANT
GLOVE BIOGEL PI INDICATOR 6.5 (GLOVE) ×4
GLOVE BIOGEL PI INDICATOR 7.5 (GLOVE) ×8
GLOVE ECLIPSE 7.0 STRL STRAW (GLOVE) ×6 IMPLANT
GLOVE ECLIPSE 7.5 STRL STRAW (GLOVE) ×6 IMPLANT
GOWN STRL REUS W/ TWL LRG LVL3 (GOWN DISPOSABLE) ×3 IMPLANT
GOWN STRL REUS W/ TWL XL LVL3 (GOWN DISPOSABLE) ×2 IMPLANT
GOWN STRL REUS W/TWL LRG LVL3 (GOWN DISPOSABLE) ×6
GOWN STRL REUS W/TWL XL LVL3 (GOWN DISPOSABLE) ×4
INTRODUCER COOK 11FR (CATHETERS) IMPLANT
INTRODUCER SET COOK 14FR (MISCELLANEOUS) IMPLANT
KIT BASIN OR (CUSTOM PROCEDURE TRAY) ×3 IMPLANT
KIT ENCORE 26 ADVANTAGE (KITS) IMPLANT
KIT ROOM TURNOVER OR (KITS) ×3 IMPLANT
LIQUID BAND (GAUZE/BANDAGES/DRESSINGS) ×6 IMPLANT
NEEDLE PERC 18GX7CM (NEEDLE) ×3 IMPLANT
NS IRRIG 1000ML POUR BTL (IV SOLUTION) ×3 IMPLANT
PACK CV ACCESS (CUSTOM PROCEDURE TRAY) ×3 IMPLANT
PAD ARMBOARD 7.5X6 YLW CONV (MISCELLANEOUS) ×6 IMPLANT
PROBE PENCIL 8 MHZ STRL DISP (MISCELLANEOUS) ×3 IMPLANT
SET INTRODUCER 12FR PACEMAKER (SHEATH) IMPLANT
SPONGE SURGIFOAM ABS GEL 100 (HEMOSTASIS) IMPLANT
SUT ETHILON 3 0 PS 1 (SUTURE) IMPLANT
SUT MNCRL AB 4-0 PS2 18 (SUTURE) ×3 IMPLANT
SUT PROLENE 6 0 BV (SUTURE) ×3 IMPLANT
SUT PROLENE 7 0 BV 1 (SUTURE) ×9 IMPLANT
SUT SILK 0 TIES 10X30 (SUTURE) ×3 IMPLANT
SUT SILK 2 0 FS (SUTURE) IMPLANT
SUT VIC AB 3-0 SH 27 (SUTURE) ×2
SUT VIC AB 3-0 SH 27X BRD (SUTURE) ×1 IMPLANT
SYR 20CC LL (SYRINGE) ×3 IMPLANT
UNDERPAD 30X30 INCONTINENT (UNDERPADS AND DIAPERS) ×3 IMPLANT
WATER STERILE IRR 1000ML POUR (IV SOLUTION) ×3 IMPLANT
WIRE BENTSON .035X145CM (WIRE) ×3 IMPLANT

## 2014-07-16 NOTE — Transfer of Care (Signed)
Immediate Anesthesia Transfer of Care Note  Patient: Bryan Wilkerson  Procedure(s) Performed: Procedure(s): FISTULOGRAM (Right) LIGATION OF COMPETING BRANCHES OF ARTERIOVENOUS FISTULA (Right)  Patient Location: PACU  Anesthesia Type:MAC  Level of Consciousness: awake, alert , oriented and patient cooperative  Airway & Oxygen Therapy: Patient Spontanous Breathing  Post-op Assessment: Report given to PACU RN, Post -op Vital signs reviewed and stable and Patient moving all extremities  Post vital signs: Reviewed and stable  Complications: No apparent anesthesia complications

## 2014-07-16 NOTE — H&P (Signed)
Brief History and Physical  History of Present Illness  Bryan Wilkerson is a 49 y.o. male who presents with chief complaint: variable functioning R RC AVF.  The patient presents today for R arm fistulogram, possible side branch ligation.    Past Medical History  Diagnosis Date  . Hypertension   . Psoriasis   . CHF (congestive heart failure)   . Diabetes mellitus without complication     type 1  . Retinopathy   . Pneumonia   . Anemia   . Chronic kidney disease     MWF    Past Surgical History  Procedure Laterality Date  . Cataract extraction w/ intraocular lens  implant, bilateral    . Eye surgery      retina reattachment  . Av fistula placement Right 12/05/2013    Procedure: RADIOCEPHALIC VS. BRACHIOCEPHALIC ARTERIOVENOUS (AV) FISTULA CREATION;  Surgeon: Conrad Anoka, MD;  Location: [facility];  Service: Vascular;  Laterality: Right;  . Hemodialysis catheter Left     History   Social History  . Marital Status: Married    Spouse Name: N/A    Number of Children: N/A  . Years of Education: N/A   Occupational History  . Not on file.   Social History Main Topics  . Smoking status: Never Smoker   . Smokeless tobacco: Never Used  . Alcohol Use: No  . Drug Use: No  . Sexual Activity: Not on file   Other Topics Concern  . Not on file   Social History Narrative    Family History  Problem Relation Age of Onset  . Hypertension Mother   . Heart attack Mother     No current facility-administered medications on file prior to encounter.   Current Outpatient Prescriptions on File Prior to Encounter  Medication Sig Dispense Refill  . hydrALAZINE (APRESOLINE) 100 MG tablet Take 100 mg by mouth 2 (two) times daily.  6  . insulin NPH-regular Human (NOVOLIN 70/30) (70-30) 100 UNIT/ML injection Inject 10 Units into the skin daily.     . irbesartan (AVAPRO) 150 MG tablet Take 150 mg by mouth daily.  6  . multivitamin (RENA-VIT) TABS tablet Take 1 tablet by mouth daily.  6    . triamcinolone ointment (KENALOG) 0.1 % Apply 1 application topically as needed (psorasis).     [name] amLODipine (NORVASC) 2.5 MG tablet Take 1 tablet (2.5 mg total) by mouth daily. 30 tablet 11  . ferrous sulfate 325 (65 FE) MG tablet Take 1 tablet (325 mg total) by mouth 2 (two) times daily with a meal. (Patient not taking: Reported on 06/07/2014) 60 tablet 11  . furosemide (LASIX) 20 MG tablet Take 1 tablet (20 mg total) by mouth 2 (two) times daily. (Patient not taking: Reported on 06/07/2014) 60 tablet 11  . glucose blood ([employer] BLOOD GLUCOSE TEST STRIPS) test strip Use as instructed One touch Ultra test strips 300 each 12  . hydrALAZINE (APRESOLINE) 25 MG tablet Take 1 tablet (25 mg total) by mouth every 8 (eight) hours. (Patient not taking: Reported on 06/07/2014) 90 tablet 11  . latanoprost (XALATAN) 0.005 % ophthalmic solution Place 1 drop into both eyes at bedtime.     [name] oxyCODONE (ROXICODONE) 5 MG immediate release tablet Take 1 tablet (5 mg total) by mouth every 6 (six) hours as needed for severe pain. 30 tablet 0    Allergies  Allergen Reactions  . Penicillins Other (See Comments)    childhood  Review of Systems: As listed above, otherwise negative.  Physical Examination  Filed Vitals:   07/16/14 0600 07/16/14 0602 07/16/14 0638  BP:  207/104 189/97  Pulse: 86    Temp: 97.7 F (36.5 C)    TempSrc: Oral    Resp: 20    Height: 6\' 2"  (1.88 m)    Weight: 182 lb (82.555 kg)    SpO2: 99%      General: A&O x 3, WDWN  Pulmonary: Sym exp, good air movt, CTAB, no rales, rhonchi, & wheezing  Cardiac: RRR, Nl S1, S2, no Murmurs, rubs or gallops  Gastrointestinal: soft, NTND, -G/R, - HSM, - masses, - CVAT B  Musculoskeletal: M/S 5/5 throughout , Extremities without ischemic changes   Laboratory See iStat  Medical Decision Making  Bryan Wilkerson is a 49 y.o. male who presents with: variable functional R RC AVF.   The patient is scheduled for: R arm fistulogram, possible  side branch ligation  Pt originally was scheduled months prior, since then he proceed with HD via the R RC AVF with variable degree of success to date.  Risk, benefits, and alternatives to access surgery were discussed.  The patient is aware the risks include but are not limited to: bleeding, infection, steal syndrome, nerve damage, ischemic monomelic neuropathy, failure to mature, and need for additional procedures.  The patient is aware of the risks and agrees to proceed.  Adele Barthel, MD [facility] Office: [phone] Pager: [phone]  07/16/2014, 7:25 AM

## 2014-07-16 NOTE — Progress Notes (Signed)
Report given to philip rn as caregiver 

## 2014-07-16 NOTE — OR Nursing (Signed)
1059-Addendum.  Bovie pad placement site and applier's name added to chart.  August Albino, Therapist, sports.

## 2014-07-16 NOTE — Anesthesia Preprocedure Evaluation (Signed)
Anesthesia Evaluation  Patient identified by MRN, date of birth, ID band Patient awake    Reviewed: Allergy & Precautions, NPO status , Patient's Chart, lab work & pertinent test results  History of Anesthesia Complications Negative for: history of anesthetic complications  Airway Mallampati: II  TM Distance: >3 FB Neck ROM: Full    Dental  (+) Teeth Intact   Pulmonary neg shortness of breath, neg sleep apnea, neg COPDneg recent URI,  breath sounds clear to auscultation        Cardiovascular hypertension, Pt. on medications - angina+CHF - CAD, - Past MI, - Cardiac Stents and - CABG Rhythm:Regular     Neuro/Psych negative neurological ROS  negative psych ROS   GI/Hepatic negative GI ROS, Neg liver ROS,   Endo/Other  diabetes, Poorly Controlled, Type 2, Insulin Dependent  Renal/GU ESRF and DialysisRenal disease     Musculoskeletal   Abdominal   Peds  Hematology negative hematology ROS (+)   Anesthesia Other Findings   Reproductive/Obstetrics                             Anesthesia Physical Anesthesia Plan  ASA: III  Anesthesia Plan: MAC   Post-op Pain Management:    Induction: Intravenous  Airway Management Planned: Simple Face Mask and Natural Airway  Additional Equipment: None  Intra-op Plan:   Post-operative Plan:   Informed Consent: I have reviewed the patients History and Physical, chart, labs and discussed the procedure including the risks, benefits and alternatives for the proposed anesthesia with the patient or authorized representative who has indicated his/her understanding and acceptance.   Dental advisory given  Plan Discussed with: CRNA and Surgeon  Anesthesia Plan Comments:         Anesthesia Quick Evaluation

## 2014-07-16 NOTE — Discharge Instructions (Signed)
What to eat: ° °For your first meals, you should eat lightly; only small meals initially.  If you do not have nausea, you may eat larger meals.  Avoid spicy, greasy and heavy food.   ° °General Anesthesia, Adult, Care After  °Refer to this sheet in the next few weeks. These instructions provide you with information on caring for yourself after your procedure. Your health care provider may also give you more specific instructions. Your treatment has been planned according to current medical practices, but problems sometimes occur. Call your health care provider if you have any problems or questions after your procedure.  °WHAT TO EXPECT AFTER THE PROCEDURE  °After the procedure, it is typical to experience:  °Sleepiness.  °Nausea and vomiting. °HOME CARE INSTRUCTIONS  °For the first 24 hours after general anesthesia:  °Have a responsible person with you.  °Do not drive a car. If you are alone, do not take public transportation.  °Do not drink alcohol.  °Do not take medicine that has not been prescribed by your health care provider.  °Do not sign important papers or make important decisions.  °You may resume a normal diet and activities as directed by your health care provider.  °Change bandages (dressings) as directed.  °If you have questions or problems that seem related to general anesthesia, call the hospital and ask for the anesthetist or anesthesiologist on call. °SEEK MEDICAL CARE IF:  °You have nausea and vomiting that continue the day after anesthesia.  °You develop a rash. °SEEK IMMEDIATE MEDICAL CARE IF:  °You have difficulty breathing.  °You have chest pain.  °You have any allergic problems. °Document Released: 09/27/2000 Document Revised: 02/21/2013 Document Reviewed: 01/04/2013  °ExitCare® Patient Information ©2014 [employer].  ° ° °Laceration Care, Adult  ° ° °A laceration is a cut that goes through all layers of the skin. The cut goes into the tissue beneath the skin.  °HOME CARE  °For stitches  (sutures) or staples:  °Keep the cut clean and dry.  °If you have a bandage (dressing), change it at least once a day. Change the bandage if it gets wet or dirty, or as told by your doctor.  °Wash the cut with soap and water 2 times a day. Rinse the cut with water. Pat it dry with a clean towel.  °Put a thin layer of medicated cream on the cut as told by your doctor.  °You may shower after the first 24 hours. Do not soak the cut in water until the stitches are removed.  °Only take medicines as told by your doctor.  °Have your stitches or staples removed as told by your doctor. °For skin adhesive strips:  °Keep the cut clean and dry.  °Do not get the strips wet. You may take a bath, but be careful to keep the cut dry.  °If the cut gets wet, pat it dry with a clean towel.  °The strips will fall off on their own. Do not remove the strips that are still stuck to the cut. °For wound glue:  °You may shower or take baths. Do not soak or scrub the cut. Do not swim. Avoid heavy sweating until the glue falls off on its own. After a shower or bath, pat the cut dry with a clean towel.  °Do not put medicine on your cut until the glue falls off.  °If you have a bandage, do not put tape over the glue.  °Avoid lots of sunlight or tanning lamps until   the glue falls off. Put sunscreen on the cut for the first year to reduce your scar.  °The glue will fall off on its own. Do not pick at the glue. °You may need a tetanus shot if:  °You cannot remember when you had your last tetanus shot.  °You have never had a tetanus shot. °If you need a tetanus shot and you choose not to have one, you may get tetanus. Sickness from tetanus can be serious.  °GET HELP RIGHT AWAY IF:  °Your pain does not get better with medicine.  °Your arm, hand, leg, or foot loses feeling (numbness) or changes color.  °Your cut is bleeding.  °Your joint feels weak, or you cannot use your joint.  °You have painful lumps on your body.  °Your cut is red, puffy (swollen),  or painful.  °You have a red line on the skin near the cut.  °You have yellowish-white fluid (pus) coming from the cut.  °You have a fever.  °You have a bad smell coming from the cut or bandage.  °Your cut breaks open before or after stitches are removed.  °You notice something coming out of the cut, such as wood or glass.  °You cannot move a finger or toe. °MAKE SURE YOU:  °Understand these instructions.  °Will watch your condition.  °Will get help right away if you are not doing well or get worse. °Document Released: 12/08/2007 Document Revised: 09/13/2011 Document Reviewed: 12/15/2010  °ExitCare® Patient Information ©2014 [employer].  ° ° °

## 2014-07-16 NOTE — Op Note (Addendum)
OPERATIVE NOTE   PROCEDURE: 1. Right radiocephalic arteriovenous fistula cannulation under ultrasound guidance 2. Right arm fistulogram Repair of right radiocephalic arteriovenous fistula Ligation of side branch (brachial/basilic branch)  PRE-OPERATIVE DIAGNOSIS: Poorly function right radiocephalic arteriovenous fistula    POST-OPERATIVE DIAGNOSIS: same as above   SURGEON: Adele Barthel, MD  ANESTHESIA: local  ESTIMATED BLOOD LOSS: 5 cc  FINDING(S): Patent right radiocephalic arteriovenous fistula with drainage from forearm via basilic/brachial and cephalic systems: sclerotic segment at cannulation site Patent central venous structures Widely patent superior vena cava with tunneled dialysis catheter running through   SPECIMEN(S):  None  CONTRAST: 30 cc  INDICATIONS: Bryan Wilkerson is a 49 y.o. male who  presents with poorly functioning right radiocephalic arteriovenous fistula.  The patient is scheduled for right arm fistulogram.  The patient is aware the risks include but are not limited to: bleeding, infection, thrombosis of the cannulated access, and possible anaphylactic reaction to the contrast.  The patient is aware of the risks of the procedure and elects to proceed forward.  DESCRIPTION: After full informed written consent was obtained, the patient was brought back to the hybrid OR and placed supine upon the OR table.  The patient was connected to monitoring equipment.  The right forearm was prepped and draped in the standard fashion for a right arm fistulogram and right arm access procedure.  1% lidocaine with epinepherine was injected at the access site to obtain local anesthesia.  Under ultrasound guidance, the right radiocephalic arteriovenous fistula was cannulated with a micropuncture needle.  The microwire was advanced into the fistula and the needle was exchanged for the a microsheath, which was lodged 2 cm into the access.  The wire was removed and the sheath was  connected to the IV extension tubing.  Hand injections were completed to image the access from the antecubitum up to the level of axilla.  The central venous structures were also imaged by hand injections.  Based on the images, this patient will need: ligation of side branch.    The table was rotated to facilitate access to the right arm.  The distal side branch was in the vicinity of the sheath, so 1% lidocaine with epinepherine was injected around the access site to obtain local anesthesia.  I made an incision around the microsheath and then began dissecting out the fistula to get to the distal side branch of interest.  The fistula was inadvertently entered and had to be repaired with a 7-0 Prolene.  I then removed the sheath and repaired the puncture with a 7-0 Prolene.  I held gentle pressure until the residual bleeding stopped.  The incision was repaired with a running subcuticular of 4-0 Monocryl.  I tried to identify the distal side branch distal to the fistula but could not find it on Sonosite guidance.    I then turned my attention to the mid-forearm.  1% lidocaine with epinepherine was injected at this incision site to obtain local anesthesia.  In total, 10 cc of local anesthetic was used during this case.  I made an incision over the bifurcation of the forearm cephalic vein into branches into the upper arm cephalic vein and upper arm basilic/brachial vein.  I elected to ligate the basilic/brachial vein to force the upper arm cephalic vein to dilate.  The subcutaneous tissue was reapproximated with a running stitch of 3-0 Vicryl.  I then reapproximated the skin with a running subcuticular stitch of 4-0 Moncryl.    Both incisions were  reinforced with Dermabond.    COMPLICATIONS: none  CONDITION: stable   Adele Barthel, MD [facility] [place] Office: [phone] Pager: [phone]  07/16/2014 9:09 AM

## 2014-07-16 NOTE — Anesthesia Postprocedure Evaluation (Signed)
  Anesthesia Post-op Note  Patient: Bryan Wilkerson  Procedure(s) Performed: Procedure(s): FISTULOGRAM (Right) LIGATION OF COMPETING BRANCHES OF ARTERIOVENOUS FISTULA (Right)  Patient Location: PACU  Anesthesia Type:MAC  Level of Consciousness: awake  Airway and Oxygen Therapy: Patient Spontanous Breathing  Post-op Pain: mild  Post-op Assessment: Post-op Vital signs reviewed, Patient's Cardiovascular Status Stable, Respiratory Function Stable, Patent Airway, No signs of Nausea or vomiting and Pain level controlled  Post-op Vital Signs: Reviewed and stable  Last Vitals:  Filed Vitals:   07/16/14 1030  BP: 126/58  Pulse: 72  Temp:   Resp: [id number]    Complications: No apparent anesthesia complications

## 2014-07-16 NOTE — Progress Notes (Signed)
Dr. Ermalene Postin notified of CBG and HTN

## 2014-07-17 ENCOUNTER — Encounter (HOSPITAL_COMMUNITY): Payer: Self-pay | Admitting: Vascular Surgery

## 2014-07-17 ENCOUNTER — Telehealth: Payer: Self-pay | Admitting: Vascular Surgery

## 2014-07-17 NOTE — Telephone Encounter (Addendum)
-----   Message from Mena Goes, RN sent at 07/16/2014  9:59 AM EST ----- Regarding: Schedule   ----- Message -----    From: Ulyses Amor, PA-C    Sent: 07/16/2014   9:20 AM      To: Vvs Charge Pool  F/U with Dr. Bridgett Larsson in 4 weeks s/p av fistula ligation of competing branch and fistulogram.   07/17/2014: lm for pt re appt, dpm

## 2014-08-12 ENCOUNTER — Encounter: Payer: Self-pay | Admitting: Vascular Surgery

## 2014-08-14 ENCOUNTER — Encounter: Payer: Self-pay | Admitting: Vascular Surgery

## 2014-08-14 ENCOUNTER — Ambulatory Visit (INDEPENDENT_AMBULATORY_CARE_PROVIDER_SITE_OTHER): Payer: Self-pay | Admitting: Vascular Surgery

## 2014-08-14 VITALS — BP 155/85 | HR 93 | Resp 18 | Ht 74.0 in | Wt 186.0 lb

## 2014-08-14 DIAGNOSIS — Z992 Dependence on renal dialysis: Secondary | ICD-10-CM

## 2014-08-14 DIAGNOSIS — N186 End stage renal disease: Secondary | ICD-10-CM

## 2014-08-14 NOTE — Progress Notes (Signed)
    Postoperative Access Visit   History of Present Illness  Bryan Wilkerson is a 49 y.o. year old male who presents for postoperative follow-up for: R arm fistulogram, ligation of side branch R RC AVF (Date: 07/16/14).  The patient's wounds are healed.  The patient notes no steal symptoms.  The patient is able to complete their activities of daily living.  The patient's current symptoms are: none.  For VQI Use Only  PRE-ADM LIVING: Home  AMB STATUS: Ambulatory  Physical Examination Filed Vitals:   08/14/14 1552  BP: 155/85  Pulse: 93  Resp: 18    RUE: Incision is healed, skin feels warm, hand grip is 5/5, sensation in digits is  intact, easily palpable thrill, bruit can be auscultated , on Sonosite: increased number of side branches with some segment of R RC AVF now >6 mm.  Upper arm cephalic vein is distended visibly now  Medical Decision Making  Bryan Wilkerson is a 49 y.o. year old male who presents s/p R arm fistulogram, SBL.  The patient's access is ready for use.  The patient's tunneled dialysis catheter can be removed after two successful cannulations and completed dialysis treatments.  If this R RC AVF is not able to support adequate flow rate, I would abandon this fistula and proceed with ligation of R RC AVF, placement of R BC AVF.   I intentionally ligated the brachial/basilic venous drainage to distend the upper arm cephalic in case this was necessary.  The [facility] AVF essentially becomes a staged fistula.  Thank you for allowing [place] to participate in this patient's care.  Adele Barthel, MD [facility] [place] Office: [phone] Pager: [phone]  08/14/2014, 4:10 PM

## 2014-08-16 ENCOUNTER — Encounter: Payer: 59 | Admitting: Vascular Surgery

## 2014-09-06 ENCOUNTER — Other Ambulatory Visit: Payer: Self-pay | Admitting: *Deleted

## 2014-09-06 DIAGNOSIS — T82510D Breakdown (mechanical) of surgically created arteriovenous fistula, subsequent encounter: Secondary | ICD-10-CM

## 2014-09-10 ENCOUNTER — Encounter: Payer: Self-pay | Admitting: Vascular Surgery

## 2014-09-11 ENCOUNTER — Ambulatory Visit: Payer: 59 | Admitting: Vascular Surgery

## 2014-09-11 ENCOUNTER — Inpatient Hospital Stay (HOSPITAL_COMMUNITY): Admission: RE | Admit: 2014-09-11 | Payer: 59 | Source: Ambulatory Visit

## 2014-09-19 ENCOUNTER — Ambulatory Visit (HOSPITAL_COMMUNITY)
Admission: RE | Admit: 2014-09-19 | Discharge: 2014-09-19 | Disposition: A | Discharge status: Home / Self Care | Payer: 59 | Source: Ambulatory Visit | Attending: Vascular Surgery | Admitting: Vascular Surgery

## 2014-09-19 DIAGNOSIS — Z992 Dependence on renal dialysis: Secondary | ICD-10-CM | POA: Insufficient documentation

## 2014-09-19 DIAGNOSIS — T82510D Breakdown (mechanical) of surgically created arteriovenous fistula, subsequent encounter: Secondary | ICD-10-CM

## 2014-09-19 DIAGNOSIS — Z48812 Encounter for surgical aftercare following surgery on the circulatory system: Secondary | ICD-10-CM | POA: Diagnosis not present

## 2014-09-25 ENCOUNTER — Encounter: Payer: Self-pay | Admitting: Vascular Surgery

## 2014-09-26 ENCOUNTER — Encounter: Payer: Self-pay | Admitting: Vascular Surgery

## 2014-09-26 ENCOUNTER — Ambulatory Visit (INDEPENDENT_AMBULATORY_CARE_PROVIDER_SITE_OTHER): Payer: Self-pay | Admitting: Vascular Surgery

## 2014-09-26 VITALS — BP 153/89 | HR 78 | Ht 74.0 in | Wt 186.0 lb

## 2014-09-26 DIAGNOSIS — N186 End stage renal disease: Secondary | ICD-10-CM

## 2014-09-26 DIAGNOSIS — Z992 Dependence on renal dialysis: Secondary | ICD-10-CM

## 2014-09-26 NOTE — Progress Notes (Signed)
    Postoperative Access Visit   History of Present Illness  Lamour Fullbright is a 49 y.o. year old male who presents for postoperative follow-up for: Ligation of side branch, repair of R RC AVF (Date: 07/16/24).  The patient was sent here today for poor cannulation but the patient notes they have been running HD via the R RC AVF successfully over the last few runs.  For VQI Use Only  PRE-ADM LIVING: Home  AMB STATUS: Ambulatory  Physical Examination Filed Vitals:   09/26/14 1006  BP: 153/89  Pulse: 78    RUE: Incision are healed, skin feels warm, hand grip is 5/5, sensation in digits is intact, palpable thrill, bruit can be auscultated   Medical Decision Making  Morgan Mclane is a 49 y.o. year old male who presents s/p R RC AVF, SBL, repair of RC AVF.  The patient's access is already being used.  Thank you for allowing [place] to participate in this patient's care.  Adele Barthel, MD [facility] [place] Office: [phone] Pager: [phone]  09/26/2014, 11:19 AM

## 2015-07-06 HISTORY — PX: PORT-A-CATH REMOVAL: SHX5289

## 2015-09-18 ENCOUNTER — Other Ambulatory Visit (HOSPITAL_COMMUNITY): Payer: Self-pay | Admitting: Nephrology

## 2015-09-18 ENCOUNTER — Ambulatory Visit (HOSPITAL_COMMUNITY)
Admission: RE | Admit: 2015-09-18 | Discharge: 2015-09-18 | Disposition: A | Discharge status: Home / Self Care | Payer: 59 | Source: Ambulatory Visit | Attending: Internal Medicine | Admitting: Internal Medicine

## 2015-09-18 DIAGNOSIS — R079 Chest pain, unspecified: Secondary | ICD-10-CM

## 2015-09-23 ENCOUNTER — Ambulatory Visit (HOSPITAL_COMMUNITY)
Admission: RE | Admit: 2015-09-23 | Discharge: 2015-09-23 | Disposition: A | Discharge status: Home / Self Care | Payer: 59 | Source: Ambulatory Visit | Attending: Nephrology | Admitting: Nephrology

## 2015-09-23 DIAGNOSIS — E119 Type 2 diabetes mellitus without complications: Secondary | ICD-10-CM | POA: Diagnosis not present

## 2015-09-23 DIAGNOSIS — R079 Chest pain, unspecified: Secondary | ICD-10-CM | POA: Insufficient documentation

## 2015-09-23 DIAGNOSIS — I119 Hypertensive heart disease without heart failure: Secondary | ICD-10-CM | POA: Insufficient documentation

## 2015-09-23 NOTE — Progress Notes (Signed)
Echocardiogram 2D Echocardiogram has been performed.  Bryan Wilkerson 09/23/2015, 11:46 AM

## 2016-06-14 ENCOUNTER — Other Ambulatory Visit: Payer: Self-pay | Admitting: *Deleted

## 2016-06-14 DIAGNOSIS — Z01810 Encounter for preprocedural cardiovascular examination: Secondary | ICD-10-CM

## 2016-06-14 DIAGNOSIS — N186 End stage renal disease: Secondary | ICD-10-CM

## 2016-06-17 ENCOUNTER — Encounter: Payer: Self-pay | Admitting: Vascular Surgery

## 2016-06-23 ENCOUNTER — Other Ambulatory Visit: Payer: Self-pay

## 2016-06-23 ENCOUNTER — Encounter: Payer: Self-pay | Admitting: Vascular Surgery

## 2016-06-23 ENCOUNTER — Ambulatory Visit (INDEPENDENT_AMBULATORY_CARE_PROVIDER_SITE_OTHER)
Admission: RE | Admit: 2016-06-23 | Discharge: 2016-06-23 | Disposition: A | Discharge status: Home / Self Care | Payer: 59 | Source: Ambulatory Visit | Attending: Vascular Surgery | Admitting: Vascular Surgery

## 2016-06-23 ENCOUNTER — Ambulatory Visit (INDEPENDENT_AMBULATORY_CARE_PROVIDER_SITE_OTHER): Payer: 59 | Admitting: Vascular Surgery

## 2016-06-23 ENCOUNTER — Ambulatory Visit (HOSPITAL_COMMUNITY)
Admission: RE | Admit: 2016-06-23 | Discharge: 2016-06-23 | Disposition: A | Discharge status: Home / Self Care | Payer: 59 | Source: Ambulatory Visit | Attending: Vascular Surgery | Admitting: Vascular Surgery

## 2016-06-23 VITALS — BP 210/96 | HR 66 | Temp 97.2°F | Resp 18 | Ht 73.0 in | Wt 190.0 lb

## 2016-06-23 DIAGNOSIS — Z01810 Encounter for preprocedural cardiovascular examination: Secondary | ICD-10-CM | POA: Diagnosis present

## 2016-06-23 DIAGNOSIS — N186 End stage renal disease: Secondary | ICD-10-CM

## 2016-06-23 DIAGNOSIS — Z992 Dependence on renal dialysis: Secondary | ICD-10-CM

## 2016-06-23 NOTE — H&P (Signed)
Referring Physician: Nicole Kindred MD  Patient name: Bryan Wilkerson MRN: [id number] DOB: 1965/07/15 Sex: male  REASON FOR CONSULT: new hemodialysis access  HPI: [name] is a 50 y.o. male,  who currently is considering peroneal dialysis but needs a existing hemodialysis access as a backup. He currently has a right-sided dialysis catheter. He dialyzes Monday Wednesday Friday. He has an occluded right radiocephalic AV fistula. Other medical problems include congestive failure, diabetes, hypertension all of which are currently stable.  Past Medical History:  Diagnosis Date  . Anemia   . CHF (congestive heart failure) ([place])   . Chronic kidney disease    MWF  . Diabetes mellitus without complication ([place])    type 1  . Hypertension   . Pneumonia   . Psoriasis   . Retinopathy    Past Surgical History:  Procedure Laterality Date  . AV FISTULA PLACEMENT Right 12/05/2013   Procedure: RADIOCEPHALIC VS. BRACHIOCEPHALIC ARTERIOVENOUS (AV) FISTULA CREATION;  Surgeon: Conrad Summerset, MD;  Location: [place];  Service: Vascular;  Laterality: Right;  . CATARACT EXTRACTION W/ INTRAOCULAR LENS  IMPLANT, BILATERAL    . EYE SURGERY     retina reattachment  . FISTULOGRAM Right 07/16/2014   Procedure: FISTULOGRAM;  Surgeon: Conrad Plainfield, MD;  Location: [place];  Service: Vascular;  Laterality: Right;  . Hemodialysis Catheter Left   . LIGATION OF COMPETING BRANCHES OF ARTERIOVENOUS FISTULA Right 07/16/2014   Procedure: LIGATION OF COMPETING BRANCHES OF ARTERIOVENOUS FISTULA;  Surgeon: Conrad Spotts[place], MD;  Location: [place];  Service: Vascular;  Laterality: Right;    Family History  Problem Relation Age of Onset  . Hypertension Mother   . Heart attack Mother     SOCIAL HISTORY: Social History   Social History  . Marital status: Married    Spouse name: N/A  . Number of children: N/A  . Years of education: N/A   Occupational History  . Not on file.   Social History Main Topics  . Smoking  status: Never Smoker  . Smokeless tobacco: Never Used  . Alcohol use No  . Drug use: No  . Sexual activity: Not on file   Other Topics Concern  . Not on file   Social History Narrative  . No narrative on file    Allergies  Allergen Reactions  . Penicillins Other (See Comments)    childhood    Current Outpatient Prescriptions  Medication Sig Dispense Refill  . amLODipine (NORVASC) 2.5 MG tablet Take 1 tablet (2.5 mg total) by mouth daily. 30 tablet 11  . carvedilol (COREG) 6.25 MG tablet TK 1 T PO BID  10  . clobetasol cream (TEMOVATE) 0.05 % APP AA BID  0  . ferrous sulfate 325 (65 FE) MG tablet Take 1 tablet (325 mg total) by mouth 2 (two) times daily with a meal. 60 tablet 11  . FOSRENOL 500 MG chewable tablet Chew 500 mg by mouth 3 (three) times daily with meals.     [name] glucose blood ([employer] BLOOD GLUCOSE TEST STRIPS) test strip Use as instructed One touch Ultra test strips 300 each 12  . insulin aspart (NOVOLOG) 100 UNIT/ML injection Inject into the skin.    [name] insulin NPH-regular Human (NOVOLIN 70/30) (70-30) 100 UNIT/ML injection Inject 10 Units into the skin daily.     . multivitamin (RENA-VIT) TABS tablet Take 1 tablet by mouth daily.  6  . RENVELA 800 MG tablet     . STELARA 45 MG/0.5ML  SOSY     . triamcinolone ointment (KENALOG) 0.1 % Apply 1 application topically as needed (psorasis).      No current facility-administered medications for this visit.     ROS:   General:  No weight loss, Fever, chills  HEENT: No recent headaches, no nasal bleeding, no visual changes, no sore throat  Neurologic: No dizziness, blackouts, seizures. No recent symptoms of stroke or mini- stroke. No recent episodes of slurred speech, or temporary blindness.  Cardiac: No recent episodes of chest pain/pressure, no shortness of breath at rest.  No shortness of breath with exertion.  Denies history of atrial fibrillation or irregular heartbeat  Vascular: No history of rest pain in feet.  No  history of claudication.  No history of non-healing ulcer, No history of DVT   Pulmonary: No home oxygen, no productive cough, no hemoptysis,  No asthma or wheezing  Musculoskeletal:  [ ]  Arthritis, [ ]  Low back pain,  [ ]  Joint pain  Hematologic:No history of hypercoagulable state.  No history of easy bleeding.  No history of anemia  Gastrointestinal: No hematochezia or melena,  No gastroesophageal reflux, no trouble swallowing  Urinary: [X]  chronic Kidney disease, [X]  on HD - [X]  MWF or [ ]  TTHS, [ ]  Burning with urination, [ ]  Frequent urination, [ ]  Difficulty urinating;   Skin: No rashes  Psychological: No history of anxiety,  No history of depression   Physical Examination  Vitals:   06/23/16 1237 06/23/16 1238  BP: (!) 213/98 (!) 210/96  Pulse: 62 66  Resp: 18   Temp: 97.2 F (36.2 C)   TempSrc: Oral   SpO2: 95%   Weight: 190 lb (86.2 kg)   Height: 6\' 1"  (1.854 m)     Body mass index is 25.07 kg/m.  General:  Alert and oriented, no acute distress HEENT: Normal Pulmonary: Clear to auscultation bilaterally Cardiac: Regular Rate and Rhythm  Skin: No rash Extremity Pulses:  2+ radial, brachial pulses bilaterally, occluded right forearm AV fistula Musculoskeletal: No deformity or edema  Neurologic: Upper and lower extremity motor 5/5 and symmetric  DATA:  Vein mapping ultrasound of the right and left upper extremities was performed today. The right upper cephalic vein is 4-6 mm in diameter basilic 4-6 mm diameter left cephalic vein is 4 mm at the wrist 5 mm in the upper arm left basilic is 5-7 mm. Of note there was partially occlusive thrombus diffusely throughout the right upper arm cephalic vein as well.  Arterial duplex exam was also performed radial artery at the wrist was 2 mm diameter with triphasic waveform 6 mm brachial artery, right radial artery was 2.7 mm brachial artery 6 mm  ASSESSMENT:  Patient needs long-term hemodialysis access. Best next option  would be a left radiocephalic AV fistula. I also discussed with the patient possibility of a right basilic vein transposition fistula if he wanted to stay in the right arm. I do not believe a right brachiocephalic fistula is going to be advisable since there is thrombus within the vein diffusely.   PLAN:  Left radiocephalic AV fistula 76/73/4193. Risks benefits possible complications and procedure details were discussed with the patient and his wife today. These include but are not limited to bleeding infection. Incisional numbness non-maturation of the fistula. He understands and agrees to proceed.   Ruta Hinds, MD [facility] [place] Office: [phone] Pager: [phone]

## 2016-06-24 ENCOUNTER — Encounter: Payer: Self-pay | Admitting: Nephrology

## 2016-07-12 ENCOUNTER — Other Ambulatory Visit: Payer: Self-pay

## 2016-07-12 MED ORDER — VANCOMYCIN HCL IN DEXTROSE 1-5 GM/200ML-% IV SOLN: 1000.0000 mg | INTRAVENOUS | Status: AC

## 2016-07-12 MED ORDER — SODIUM CHLORIDE 0.9 % IV SOLN
INTRAVENOUS | Status: DC
  Administered 2016-07-20 (×2): via INTRAVENOUS

## 2016-07-19 ENCOUNTER — Encounter (HOSPITAL_COMMUNITY): Payer: Self-pay | Admitting: *Deleted

## 2016-07-19 NOTE — Progress Notes (Signed)
Pt denies SOB, chest pain, and being under the care of a cardiologist. Pt denies having a stress test and cardiac cath. Pt denies having a chest x ray and EKG within the last yearr. Pt stated that fasting blood glucose ranges from 120-130. Pt made aware of diabetes protocol to check blood glucose (BG) every 2 hours prior to arrival, to not take Novolin 70/30 the morning of surgery to on take half of correction dose for a BG >220 and to drink 4 oz. of Cranberry Juice if BG is <70. Pt made aware to stop taking vitamins, fish oil and herbal medications. Do not take any NSAIDs ie: Ibuprofen, Advil, Naproxen, BC and Goody Powder. Pt verbalized understanding of all pre-op instructions.

## 2016-07-20 ENCOUNTER — Encounter (HOSPITAL_COMMUNITY)
Admission: RE | Disposition: A | Discharge status: Home / Self Care | Payer: Self-pay | Source: Ambulatory Visit | Attending: Vascular Surgery

## 2016-07-20 ENCOUNTER — Ambulatory Visit (HOSPITAL_COMMUNITY): Payer: 59 | Admitting: Anesthesiology

## 2016-07-20 ENCOUNTER — Other Ambulatory Visit: Payer: Self-pay | Admitting: *Deleted

## 2016-07-20 ENCOUNTER — Encounter (HOSPITAL_COMMUNITY): Payer: Self-pay | Admitting: *Deleted

## 2016-07-20 ENCOUNTER — Telehealth: Payer: Self-pay | Admitting: Vascular Surgery

## 2016-07-20 ENCOUNTER — Ambulatory Visit (HOSPITAL_COMMUNITY)
Admission: RE | Admit: 2016-07-20 | Discharge: 2016-07-20 | Disposition: A | Discharge status: Home / Self Care | Payer: 59 | Source: Ambulatory Visit | Attending: Vascular Surgery | Admitting: Vascular Surgery

## 2016-07-20 DIAGNOSIS — Z794 Long term (current) use of insulin: Secondary | ICD-10-CM | POA: Insufficient documentation

## 2016-07-20 DIAGNOSIS — Z4931 Encounter for adequacy testing for hemodialysis: Secondary | ICD-10-CM

## 2016-07-20 DIAGNOSIS — Z79899 Other long term (current) drug therapy: Secondary | ICD-10-CM | POA: Diagnosis not present

## 2016-07-20 DIAGNOSIS — Z880 Allergy status to penicillin: Secondary | ICD-10-CM | POA: Diagnosis not present

## 2016-07-20 DIAGNOSIS — Z9841 Cataract extraction status, right eye: Secondary | ICD-10-CM | POA: Insufficient documentation

## 2016-07-20 DIAGNOSIS — L409 Psoriasis, unspecified: Secondary | ICD-10-CM | POA: Insufficient documentation

## 2016-07-20 DIAGNOSIS — I509 Heart failure, unspecified: Secondary | ICD-10-CM | POA: Insufficient documentation

## 2016-07-20 DIAGNOSIS — Z4902 Encounter for fitting and adjustment of peritoneal dialysis catheter: Secondary | ICD-10-CM | POA: Insufficient documentation

## 2016-07-20 DIAGNOSIS — Z9889 Other specified postprocedural states: Secondary | ICD-10-CM | POA: Insufficient documentation

## 2016-07-20 DIAGNOSIS — I132 Hypertensive heart and chronic kidney disease with heart failure and with stage 5 chronic kidney disease, or end stage renal disease: Secondary | ICD-10-CM | POA: Insufficient documentation

## 2016-07-20 DIAGNOSIS — N186 End stage renal disease: Secondary | ICD-10-CM

## 2016-07-20 DIAGNOSIS — Z961 Presence of intraocular lens: Secondary | ICD-10-CM | POA: Diagnosis not present

## 2016-07-20 DIAGNOSIS — Z9842 Cataract extraction status, left eye: Secondary | ICD-10-CM | POA: Insufficient documentation

## 2016-07-20 DIAGNOSIS — E10319 Type 1 diabetes mellitus with unspecified diabetic retinopathy without macular edema: Secondary | ICD-10-CM | POA: Insufficient documentation

## 2016-07-20 DIAGNOSIS — N185 Chronic kidney disease, stage 5: Secondary | ICD-10-CM | POA: Diagnosis not present

## 2016-07-20 DIAGNOSIS — E1022 Type 1 diabetes mellitus with diabetic chronic kidney disease: Secondary | ICD-10-CM | POA: Insufficient documentation

## 2016-07-20 HISTORY — PX: AV FISTULA PLACEMENT: SHX1204

## 2016-07-20 LAB — POCT I-STAT 4, (NA,K, GLUC, HGB,HCT)
GLUCOSE: 144 mg/dL — AB (ref 65–99)
HEMATOCRIT: 39 % (ref 39.0–52.0)
Hemoglobin: 13.3 g/dL (ref 13.0–17.0)
Potassium: 4.7 mmol/L (ref 3.5–5.1)
SODIUM: 136 mmol/L (ref 135–145)

## 2016-07-20 LAB — GLUCOSE, CAPILLARY: Glucose-Capillary: 144 mg/dL — ABNORMAL HIGH (ref 65–99)

## 2016-07-20 SURGERY — ARTERIOVENOUS (AV) FISTULA CREATION
Anesthesia: General | Site: Arm Lower | Laterality: Left

## 2016-07-20 MED ORDER — FENTANYL CITRATE (PF) 100 MCG/2ML IJ SOLN
INTRAMUSCULAR | Status: DC | PRN
  Administered 2016-07-20: 50 ug via INTRAVENOUS
  Administered 2016-07-20: 100 ug via INTRAVENOUS

## 2016-07-20 MED ORDER — OXYCODONE-ACETAMINOPHEN 5-325 MG PO TABS: 1.0000 | ORAL_TABLET | Freq: Four times a day (QID) | ORAL | 0 refills | Status: DC | PRN

## 2016-07-20 MED ORDER — CLINDAMYCIN PHOSPHATE 900 MG/50ML IV SOLN
INTRAVENOUS | Status: AC
  Filled 2016-07-20: qty 50

## 2016-07-20 MED ORDER — CLINDAMYCIN PHOSPHATE 900 MG/50ML IV SOLN
INTRAVENOUS | Status: DC | PRN
  Administered 2016-07-20: 900 mg via INTRAVENOUS

## 2016-07-20 MED ORDER — LIDOCAINE HCL (PF) 1 % IJ SOLN
INTRAMUSCULAR | Status: DC | PRN
  Administered 2016-07-20: 2 mL via INTRADERMAL

## 2016-07-20 MED ORDER — 0.9 % SODIUM CHLORIDE (POUR BTL) OPTIME
TOPICAL | Status: DC | PRN
  Administered 2016-07-20: 1000 mL

## 2016-07-20 MED ORDER — ONDANSETRON HCL 4 MG/2ML IJ SOLN
INTRAMUSCULAR | Status: DC | PRN
  Administered 2016-07-20: 4 mg via INTRAVENOUS

## 2016-07-20 MED ORDER — OXYCODONE HCL 5 MG/5ML PO SOLN: 5.0000 mg | Freq: Once | ORAL | Status: DC | PRN

## 2016-07-20 MED ORDER — PROPOFOL 10 MG/ML IV BOLUS
INTRAVENOUS | Status: AC
  Filled 2016-07-20: qty 20

## 2016-07-20 MED ORDER — ONDANSETRON HCL 4 MG/2ML IJ SOLN
INTRAMUSCULAR | Status: AC
  Filled 2016-07-20: qty 2

## 2016-07-20 MED ORDER — CHLORHEXIDINE GLUCONATE CLOTH 2 % EX PADS: 6.0000 | MEDICATED_PAD | Freq: Once | CUTANEOUS | Status: DC

## 2016-07-20 MED ORDER — FENTANYL CITRATE (PF) 100 MCG/2ML IJ SOLN
INTRAMUSCULAR | Status: AC
  Filled 2016-07-20: qty 2

## 2016-07-20 MED ORDER — ONDANSETRON HCL 4 MG/2ML IJ SOLN: 4.0000 mg | Freq: Once | INTRAMUSCULAR | Status: DC | PRN

## 2016-07-20 MED ORDER — PHENYLEPHRINE 40 MCG/ML (10ML) SYRINGE FOR IV PUSH (FOR BLOOD PRESSURE SUPPORT)
PREFILLED_SYRINGE | INTRAVENOUS | Status: AC
  Filled 2016-07-20: qty 10

## 2016-07-20 MED ORDER — MIDAZOLAM HCL 5 MG/5ML IJ SOLN
INTRAMUSCULAR | Status: DC | PRN
  Administered 2016-07-20: 2 mg via INTRAVENOUS

## 2016-07-20 MED ORDER — LIDOCAINE HCL (PF) 1 % IJ SOLN
INTRAMUSCULAR | Status: AC
  Filled 2016-07-20: qty 30

## 2016-07-20 MED ORDER — LIDOCAINE HCL (CARDIAC) 20 MG/ML IV SOLN
INTRAVENOUS | Status: DC | PRN
  Administered 2016-07-20: 60 mg via INTRAVENOUS

## 2016-07-20 MED ORDER — MIDAZOLAM HCL 2 MG/2ML IJ SOLN
INTRAMUSCULAR | Status: AC
  Filled 2016-07-20: qty 2

## 2016-07-20 MED ORDER — OXYCODONE HCL 5 MG PO TABS: 5.0000 mg | ORAL_TABLET | Freq: Once | ORAL | Status: DC | PRN

## 2016-07-20 MED ORDER — SODIUM CHLORIDE 0.9 % IV SOLN
INTRAVENOUS | Status: DC | PRN
  Administered 2016-07-20: 500 mL

## 2016-07-20 MED ORDER — PROPOFOL 10 MG/ML IV BOLUS
INTRAVENOUS | Status: DC | PRN
  Administered 2016-07-20: 170 mg via INTRAVENOUS

## 2016-07-20 MED ORDER — PHENYLEPHRINE HCL 10 MG/ML IJ SOLN
INTRAMUSCULAR | Status: DC | PRN
  Administered 2016-07-20: 80 ug via INTRAVENOUS
  Administered 2016-07-20 (×2): 40 ug via INTRAVENOUS

## 2016-07-20 MED ORDER — FENTANYL CITRATE (PF) 100 MCG/2ML IJ SOLN: 25.0000 ug | INTRAMUSCULAR | Status: DC | PRN

## 2016-07-20 SURGICAL SUPPLY — 34 items
ARMBAND PINK RESTRICT EXTREMIT (MISCELLANEOUS) ×2 IMPLANT
CANISTER SUCTION 2500CC (MISCELLANEOUS) ×2 IMPLANT
CLIP TI MEDIUM 6 (CLIP) ×2 IMPLANT
CLIP TI WIDE RED SMALL 6 (CLIP) ×2 IMPLANT
COVER PROBE W GEL 5X96 (DRAPES) ×2 IMPLANT
DERMABOND ADHESIVE PROPEN (GAUZE/BANDAGES/DRESSINGS) ×1
DERMABOND ADVANCED (GAUZE/BANDAGES/DRESSINGS) ×1
DERMABOND ADVANCED .7 DNX12 (GAUZE/BANDAGES/DRESSINGS) ×1 IMPLANT
DERMABOND ADVANCED .7 DNX6 (GAUZE/BANDAGES/DRESSINGS) ×1 IMPLANT
ELECT REM PT RETURN 9FT ADLT (ELECTROSURGICAL) ×2
ELECTRODE REM PT RTRN 9FT ADLT (ELECTROSURGICAL) ×1 IMPLANT
GLOVE BIO SURGEON STRL SZ 6.5 (GLOVE) ×2 IMPLANT
GLOVE BIO SURGEON STRL SZ7.5 (GLOVE) ×2 IMPLANT
GLOVE BIOGEL PI IND STRL 6.5 (GLOVE) ×2 IMPLANT
GLOVE BIOGEL PI IND STRL 7.0 (GLOVE) ×1 IMPLANT
GLOVE BIOGEL PI INDICATOR 6.5 (GLOVE) ×2
GLOVE BIOGEL PI INDICATOR 7.0 (GLOVE) ×1
GLOVE ECLIPSE 7.5 STRL STRAW (GLOVE) ×2 IMPLANT
GLOVE SURG SS PI 6.5 STRL IVOR (GLOVE) ×2 IMPLANT
GOWN STRL REUS W/ TWL LRG LVL3 (GOWN DISPOSABLE) ×2 IMPLANT
GOWN STRL REUS W/ TWL XL LVL3 (GOWN DISPOSABLE) ×1 IMPLANT
GOWN STRL REUS W/TWL LRG LVL3 (GOWN DISPOSABLE) ×2
GOWN STRL REUS W/TWL XL LVL3 (GOWN DISPOSABLE) ×1
KIT BASIN OR (CUSTOM PROCEDURE TRAY) ×2 IMPLANT
KIT ROOM TURNOVER OR (KITS) ×2 IMPLANT
NS IRRIG 1000ML POUR BTL (IV SOLUTION) ×2 IMPLANT
PACK CV ACCESS (CUSTOM PROCEDURE TRAY) ×2 IMPLANT
PAD ARMBOARD 7.5X6 YLW CONV (MISCELLANEOUS) ×4 IMPLANT
SUT MNCRL AB 4-0 PS2 18 (SUTURE) ×2 IMPLANT
SUT PROLENE 6 0 BV (SUTURE) ×2 IMPLANT
SUT VIC AB 3-0 SH 27 (SUTURE) ×1
SUT VIC AB 3-0 SH 27X BRD (SUTURE) ×1 IMPLANT
UNDERPAD 30X30 (UNDERPADS AND DIAPERS) ×2 IMPLANT
WATER STERILE IRR 1000ML POUR (IV SOLUTION) ×2 IMPLANT

## 2016-07-20 NOTE — Transfer of Care (Signed)
Immediate Anesthesia Transfer of Care Note  Patient: Bryan Wilkerson  Procedure(s) Performed: Procedure(s): LEFT ARM RADIOCEPHALIC ARTERIOVENOUS (AV) FISTULA CREATION (Left)  Patient Location: PACU  Anesthesia Type:General  Level of Consciousness: awake, alert  and patient cooperative  Airway & Oxygen Therapy: Patient Spontanous Breathing and Patient connected to nasal cannula oxygen  Post-op Assessment: Report given to RN, Post -op Vital signs reviewed and stable and Patient moving all extremities  Post vital signs: Reviewed and stable  Last Vitals:  Vitals:   07/20/16 1055  BP: (!) 215/102  Pulse: 70  Resp: 18  Temp: 36.9 C    Last Pain:  Vitals:   07/20/16 1055  TempSrc: Oral         Complications: No apparent anesthesia complications

## 2016-07-20 NOTE — Telephone Encounter (Signed)
sched appt 08/20/16; lab at 1:00 and PA at 2:00. Lm on hm# to inform pt.

## 2016-07-20 NOTE — Anesthesia Preprocedure Evaluation (Signed)
Anesthesia Evaluation  Patient identified by MRN, date of birth, ID band Patient awake    Reviewed: Allergy & Precautions, NPO status , Patient's Chart, lab work & pertinent test results  Airway Mallampati: II  TM Distance: >3 FB Neck ROM: Full    Dental  (+) Teeth Intact, Dental Advisory Given   Pulmonary    breath sounds clear to auscultation       Cardiovascular hypertension,  Rhythm:Regular Rate:Normal     Neuro/Psych    GI/Hepatic   Endo/Other  diabetes  Renal/GU      Musculoskeletal   Abdominal   Peds  Hematology   Anesthesia Other Findings   Reproductive/Obstetrics                             Anesthesia Physical Anesthesia Plan  ASA: III  Anesthesia Plan:    Post-op Pain Management:    Induction: Intravenous  Airway Management Planned: LMA  Additional Equipment:   Intra-op Plan:   Post-operative Plan:   Informed Consent: I have reviewed the patients History and Physical, chart, labs and discussed the procedure including the risks, benefits and alternatives for the proposed anesthesia with the patient or authorized representative who has indicated his/her understanding and acceptance.   Dental advisory given  Plan Discussed with: CRNA and Anesthesiologist  Anesthesia Plan Comments:         Anesthesia Quick Evaluation

## 2016-07-20 NOTE — Progress Notes (Signed)
Pt has pre-existing right upper chest Diatek.  Both ports capped & clamped, dsg. CDI /occlusive.

## 2016-07-20 NOTE — Op Note (Signed)
OPERATIVE NOTE   PROCEDURE: Left radiocephalic arteriovenous fistula placement  PRE-OPERATIVE DIAGNOSIS: esrd  POST-OPERATIVE DIAGNOSIS: same  SURGEON: Aika Brzoska C. Donzetta Matters, MD  ASSISTANT(S): Virgina Jock, PA  ANESTHESIA: local and general  ESTIMATED BLOOD LOSS: 10 cc  FINDING(S): Cephalic vein dilated to 1.6WF, radial artery 2.18mm   INDICATIONS:   Bryan Wilkerson is a 51 y.o. male who presents with esrd.  The patient is scheduled for left radiocephalic arteriovenous fistula placement.  The patient is aware the risks include but are not limited to: bleeding, infection, steal syndrome, nerve damage, ischemic monomelic neuropathy, failure to mature, and need for additional procedures.  The patient is aware of the risks of the procedure and elects to proceed forward.  DESCRIPTION: After full informed written consent was obtained from the patient, the patient was brought back to the operating room and placed supine upon the operating table.  Prior to induction, the patient received IV antibiotics.   After obtaining adequate anesthesia, the patient was then prepped and draped in the standard fashion for a left arm access procedure.  I turned my attention first to identifying the patient's cephalic vein and radial artery.  Using SonoSite guidance, the location of these vessels were marked out on the skin.   At this point, I injected local anesthetic to obtain a field block of the antecubitum.  In total, I injected about 2 mL of a 1:1 mixture of 0.5% Marcaine without epinephrine and 1% lidocaine with epinephrine.  I made a longitudinal incision at the level of the wrist and dissected through the subcutaneous tissue and fascia to gain exposure of the radial artery.  This was noted to be 2.5 mm in diameter externally.  This was dissected out proximally and distally and controlled with vessel loops .  I then dissected out the cephalic vein.  This was noted to be 3.5 mm in diameter externally.  The  distal segment of the vein was ligated with a  2-0 silk, and the vein was transected.  The proximal segment was interrogated with serial dilators.  The vein accepted up to a 3.5 mm dilator without any difficulty.  I then instilled the heparinized saline into the vein and clamped it.  At this point, I reset my exposure of the radial artery and placed the artery under tension proximally and distally.  I made an arteriotomy with a #11 blade, and then I extended the arteriotomy with a Potts scissor.  I injected heparinized saline proximal and distal to this arteriotomy.  The vein was then sewn to the artery in an end-to-side configuration with a running stitch of 6-0 Prolene.  Prior to completing this anastomosis, I allowed the vein and artery to backbleed.  There was no evidence of clot from any vessels.  I completed the anastomosis in the usual fashion and then released all vessel loops and clamps.  There was a palpable  thrill in the venous outflow, and there was a palpable radial pulse.  At this point, I irrigated out the surgical wound.  There was no further active bleeding.  The subcutaneous tissue was reapproximated with a running stitch of 3-0 Vicryl.  The skin was then reapproximated with a running subcuticular stitch of 4-0 Vicryl.  The skin was then cleaned, dried, and reinforced with Dermabond.  The patient tolerated this procedure well.   COMPLICATIONS: none immediate  CONDITION: stable   [name] C. Donzetta Matters, MD [facility] [place] Office: [phone] Pager: [phone]  07/20/2016, 12:13 PM

## 2016-07-20 NOTE — Anesthesia Postprocedure Evaluation (Addendum)
Anesthesia Post Note  Patient: Bryan Wilkerson  Procedure(s) Performed: Procedure(s) (LRB): LEFT ARM RADIOCEPHALIC ARTERIOVENOUS (AV) FISTULA CREATION (Left)  Patient location during evaluation: PACU Anesthesia Type: General Level of consciousness: awake, awake and alert and oriented Pain management: pain level controlled Vital Signs Assessment: post-procedure vital signs reviewed and stable Respiratory status: spontaneous breathing, nonlabored ventilation and respiratory function stable Cardiovascular status: blood pressure returned to baseline Anesthetic complications: no       Last Vitals:  Vitals:   07/20/16 1250 07/20/16 1300  BP: (!) 174/94 (!) 146/91  Pulse: 62 61  Resp: 15 16  Temp: 36.7 C 36.7 C    Last Pain:  Vitals:   07/20/16 1250  TempSrc:   PainSc: 0-No pain                 [name]

## 2016-07-20 NOTE — Telephone Encounter (Signed)
-----   Message from Mena Goes, RN sent at 07/20/2016 12:46 PM EST ----- Regarding: schedule   ----- Message ----- From: Alvia Grove, PA-C Sent: 07/20/2016  12:03 PM To: Vvs Charge Pool  S/p left radial-cephalic AVF 6/62/94  F/u with Dr. Donzetta Matters in 4-6 weeks with duplex  Thanks Maudie Mercury

## 2016-07-20 NOTE — Anesthesia Procedure Notes (Signed)
Procedure Name: LMA Insertion Date/Time: 07/20/2016 11:26 AM Performed by: Izora Gala Pre-anesthesia Checklist: Patient identified, Emergency Drugs available, Suction available and Patient being monitored Patient Re-evaluated:Patient Re-evaluated prior to inductionOxygen Delivery Method: Circle system utilized Preoxygenation: Pre-oxygenation with 100% oxygen Intubation Type: IV induction Ventilation: Mask ventilation without difficulty LMA: LMA inserted LMA Size: 5.0 Number of attempts: 1 Tube secured with: Tape Dental Injury: Teeth and Oropharynx as per pre-operative assessment

## 2016-07-20 NOTE — H&P (Addendum)
     History and Physical Update  The patient was interviewed and re-examined.  The patient's previous History and Physical has been reviewed and is unchanged from Dr Oneida Alar consult. Left arm radiocephalic avf.  Alliene Klugh C. Donzetta Matters, MD [facility] [place] Office: [phone] Pager: [phone]  07/20/2016, 10:38 AM

## 2016-07-21 ENCOUNTER — Encounter (HOSPITAL_COMMUNITY): Payer: Self-pay | Admitting: Vascular Surgery

## 2016-07-22 ENCOUNTER — Encounter (HOSPITAL_BASED_OUTPATIENT_CLINIC_OR_DEPARTMENT_OTHER): Payer: Self-pay | Admitting: *Deleted

## 2016-07-22 ENCOUNTER — Emergency Department (HOSPITAL_BASED_OUTPATIENT_CLINIC_OR_DEPARTMENT_OTHER)
Admission: EM | Admit: 2016-07-22 | Discharge: 2016-07-22 | Disposition: A | Discharge status: Home / Self Care | Payer: 59 | Attending: Emergency Medicine | Admitting: Emergency Medicine

## 2016-07-22 DIAGNOSIS — E1022 Type 1 diabetes mellitus with diabetic chronic kidney disease: Secondary | ICD-10-CM | POA: Insufficient documentation

## 2016-07-22 DIAGNOSIS — Z794 Long term (current) use of insulin: Secondary | ICD-10-CM | POA: Insufficient documentation

## 2016-07-22 DIAGNOSIS — I132 Hypertensive heart and chronic kidney disease with heart failure and with stage 5 chronic kidney disease, or end stage renal disease: Secondary | ICD-10-CM | POA: Insufficient documentation

## 2016-07-22 DIAGNOSIS — Z992 Dependence on renal dialysis: Secondary | ICD-10-CM | POA: Diagnosis not present

## 2016-07-22 DIAGNOSIS — Z79899 Other long term (current) drug therapy: Secondary | ICD-10-CM | POA: Insufficient documentation

## 2016-07-22 DIAGNOSIS — I509 Heart failure, unspecified: Secondary | ICD-10-CM | POA: Diagnosis not present

## 2016-07-22 DIAGNOSIS — N186 End stage renal disease: Secondary | ICD-10-CM | POA: Insufficient documentation

## 2016-07-22 DIAGNOSIS — I10 Essential (primary) hypertension: Secondary | ICD-10-CM

## 2016-07-22 NOTE — ED Notes (Signed)
ED Provider at bedside. 

## 2016-07-22 NOTE — Discharge Instructions (Signed)
Please read attached information. If you experience any new or worsening signs or symptoms please return to the emergency room for evaluation. Please follow-up with your primary care provider or specialist as discussed.  °

## 2016-07-22 NOTE — ED Triage Notes (Signed)
Pt reports his BP has been elevated since he has had his BP medications adjusted. Pt is MWF dialysis pt. Missed yesterday due to weather but is supposed to have dialysis tomorrow

## 2016-07-22 NOTE — ED Provider Notes (Signed)
[facility] DEPT [facility] Provider Note   CSN: [id number] Arrival date & time: 07/22/16  0915     History   Chief Complaint Chief Complaint  Patient presents with  . Hypertension    HPI Alexi Geibel is a 51 y.o. male.  HPI    51 year old male with ESRD presents today with complaints of hypertension. Patient reports a long-standing history of hypertension. Patient notes that originally he was on higher dose carvedilol. He notes that his blood pressure was dropping lower than expected on this. 3 months ago his cardiologist adjusted his carvedilol by lowering it. He notes his blood pressure started to go up again, one month ago carvedilol was again increased, but he notes this has not provided significant improvement in his blood pressure. Patient notes that has followed up recently but they made no changes to his antihypertensive medications. Patient notes that because of the weather his cardiologist was not in yesterday. Patient notes his blood pressure has been in this range for several weeks and is unchanged. He denies any changes in vision, headache, chest pain or shortness of breath, abdominal pain, lower extremity swelling or edema. Patient reports a vague sense of fatigue and nasal congestion, this has been present for the last several weeks. Patient is a Monday Wednesday Friday dialysis patient, he missed his dialysis yesterday and will have dialysis tomorrow.  Past Medical History:  Diagnosis Date  . Anemia   . CHF (congestive heart failure) ([place])   . Chronic kidney disease    MWF  . Diabetes mellitus without complication ([place])    type 1  . Hypertension   . Pneumonia   . Psoriasis   . Retinopathy     Patient Active Problem List   Diagnosis Date Noted  . ESRD on dialysis ([place]) 11/16/2013  . Pre-operative cardiovascular examination 11/12/2013  . Dyspnea 10/31/2013  . CHF (congestive heart failure), NYHA class II ([place]) 12/29/2012  . Essential hypertension, benign  12/29/2012  . Hyperkalemia 12/29/2012  . Anemia 12/29/2012    Past Surgical History:  Procedure Laterality Date  . AV FISTULA PLACEMENT Right 12/05/2013   Procedure: RADIOCEPHALIC VS. BRACHIOCEPHALIC ARTERIOVENOUS (AV) FISTULA CREATION;  Surgeon: Conrad [place], MD;  Location: [facility];  Service: Vascular;  Laterality: Right;  . AV FISTULA PLACEMENT Left 07/20/2016   Procedure: LEFT ARM RADIOCEPHALIC ARTERIOVENOUS (AV) FISTULA CREATION;  Surgeon: Waynetta Sandy, MD;  Location: [facility];  Service: Vascular;  Laterality: Left;  . CATARACT EXTRACTION W/ INTRAOCULAR LENS  IMPLANT, BILATERAL    . EYE SURGERY     retina reattachment  . FISTULOGRAM Right 07/16/2014   Procedure: FISTULOGRAM;  Surgeon: Conrad [place], MD;  Location: [facility];  Service: Vascular;  Laterality: Right;  . Hemodialysis Catheter Left   . LIGATION OF COMPETING BRANCHES OF ARTERIOVENOUS FISTULA Right 07/16/2014   Procedure: LIGATION OF COMPETING BRANCHES OF ARTERIOVENOUS FISTULA;  Surgeon: Conrad , MD;  Location: [facility];  Service: Vascular;  Laterality: Right;       Home Medications    Prior to Admission medications   Medication Sig Start Date End Date Taking? Authorizing Provider  carvedilol (COREG) 25 MG tablet Take 25 mg by mouth 2 (two) times daily.   Yes Historical Provider, MD  clobetasol cream (TEMOVATE) 9.37 % 1 APPLICATION TWICE DAILY AS NEEDED FOR PSORASIS 04/04/16  Yes Historical Provider, MD  FOSRENOL 500 MG chewable tablet Chew 500 mg by mouth 3 (three) times daily with meals.  06/06/14  Yes Historical Provider,  MD  insulin NPH-regular Human (NOVOLIN 70/30) (70-30) 100 UNIT/ML injection Inject 4 Units into the skin daily.    Yes Historical Provider, MD  multivitamin (RENA-VIT) TABS tablet Take 1 tablet by mouth daily. 05/13/14  Yes Historical Provider, MD  RENVELA 800 MG tablet Take 800 mg by mouth 3 (three) times daily with meals.  09/03/14  Yes Historical Provider, MD  STELARA 45 MG/0.5ML SOSY Inject 45 mg  into the skin See admin instructions. EVERY 12 WEEKS 07/04/14  Yes Historical Provider, MD  triamcinolone ointment (KENALOG) 0.1 % Apply 1 application topically as needed (psorasis).  10/10/13  Yes Historical Provider, MD  glucose blood ([employer] BLOOD GLUCOSE TEST STRIPS) test strip Use as instructed One touch Ultra test strips Patient taking differently: 1 each by Other route daily. Use as instructed One touch Ultra test strips 11/21/13   Theodis Blaze, MD  oxyCODONE-acetaminophen (ROXICET) 5-325 MG tablet Take 1-2 tablets by mouth every 6 (six) hours as needed for severe pain. 07/20/16   Alvia Grove, PA-C    Family History Family History  Problem Relation Age of Onset  . Hypertension Mother   . Heart attack Mother     Social History Social History  Substance Use Topics  . Smoking status: Never Smoker  . Smokeless tobacco: Never Used  . Alcohol use No     Allergies   Penicillins   Review of Systems Review of Systems  All other systems reviewed and are negative.    Physical Exam Updated Vital Signs BP 198/88 (BP Location: Right Arm)   Pulse 73   Temp 98.1 F (36.7 C) (Oral)   Resp 18   SpO2 100%   Physical Exam  Constitutional: He is oriented to person, place, and time. He appears well-developed and well-nourished.  HENT:  Head: Normocephalic and atraumatic.  Eyes: Conjunctivae are normal. Pupils are equal, round, and reactive to light. Right eye exhibits no discharge. Left eye exhibits no discharge. No scleral icterus.  Neck: Normal range of motion. No JVD present. No tracheal deviation present.  Pulmonary/Chest: Effort normal. No stridor.  Neurological: He is alert and oriented to person, place, and time. Coordination normal.  Psychiatric: He has a normal mood and affect. His behavior is normal. Judgment and thought content normal.  Nursing note and vitals reviewed.    ED Treatments / Results  Labs (all labs ordered are listed, but only abnormal results are  displayed) Labs Reviewed - No data to display  EKG  EKG Interpretation None       Radiology No results found.  Procedures Procedures (including critical care time)  Medications Ordered in ED Medications - No data to display   Initial Impression / Assessment and Plan / ED Course  I have reviewed the triage vital signs and the nursing notes.  Pertinent labs & imaging results that were available during my care of the patient were reviewed by me and considered in my medical decision making (see chart for details).     Final Clinical Impressions(s) / ED Diagnoses   Final diagnoses:  Hypertension, unspecified type    Labs:  Imaging:  Consults:  Therapeutics:  Discharge Meds:   Assessment/Plan: 51 year old male presents today with complaints of hypertension. Patient has had ongoing adjustment of his antihypertensive medication recently. Patient's blood pressure continually elevated since change in medication, this is unlikely associated with any acute process. Patient's blood pressure is elevated here, but he has no signs of end organ damage. He is very well-appearing,  no recent to adjust antihypertensive medications at this time. Patient is given strict return precautions, encouraged follow-up with his cardiologist, he verbalized understanding and agreement to today's plan and no further questions or concerns at this time discharge.    New Prescriptions New Prescriptions   No medications on file     Dashaun Onstott, PA-C 07/22/16 [place], MD 07/22/16 1538

## 2016-08-04 DIAGNOSIS — N186 End stage renal disease: Secondary | ICD-10-CM | POA: Diagnosis not present

## 2016-08-04 DIAGNOSIS — E1129 Type 2 diabetes mellitus with other diabetic kidney complication: Secondary | ICD-10-CM | POA: Diagnosis not present

## 2016-08-04 DIAGNOSIS — Z992 Dependence on renal dialysis: Secondary | ICD-10-CM | POA: Diagnosis not present

## 2016-08-17 DIAGNOSIS — N189 Chronic kidney disease, unspecified: Secondary | ICD-10-CM | POA: Insufficient documentation

## 2016-08-20 ENCOUNTER — Ambulatory Visit (HOSPITAL_COMMUNITY): Payer: 59

## 2016-08-24 ENCOUNTER — Encounter: Payer: Self-pay | Admitting: Vascular Surgery

## 2016-08-27 ENCOUNTER — Encounter (HOSPITAL_COMMUNITY): Payer: 59

## 2016-09-01 DIAGNOSIS — E1129 Type 2 diabetes mellitus with other diabetic kidney complication: Secondary | ICD-10-CM | POA: Diagnosis not present

## 2016-09-01 DIAGNOSIS — Z992 Dependence on renal dialysis: Secondary | ICD-10-CM | POA: Diagnosis not present

## 2016-09-01 DIAGNOSIS — N186 End stage renal disease: Secondary | ICD-10-CM | POA: Diagnosis not present

## 2016-10-11 ENCOUNTER — Encounter: Payer: Self-pay | Admitting: Vascular Surgery

## 2016-10-22 ENCOUNTER — Ambulatory Visit (INDEPENDENT_AMBULATORY_CARE_PROVIDER_SITE_OTHER): Payer: 59 | Admitting: Vascular Surgery

## 2016-10-22 ENCOUNTER — Ambulatory Visit (HOSPITAL_COMMUNITY)
Admission: RE | Admit: 2016-10-22 | Discharge: 2016-10-22 | Disposition: A | Discharge status: Home / Self Care | Payer: 59 | Source: Ambulatory Visit | Attending: Vascular Surgery | Admitting: Vascular Surgery

## 2016-10-22 VITALS — BP 99/62 | HR 76 | Temp 97.2°F | Resp 18 | Ht 75.0 in | Wt 182.0 lb

## 2016-10-22 DIAGNOSIS — N186 End stage renal disease: Secondary | ICD-10-CM | POA: Insufficient documentation

## 2016-10-22 DIAGNOSIS — Z4931 Encounter for adequacy testing for hemodialysis: Secondary | ICD-10-CM | POA: Insufficient documentation

## 2016-10-22 DIAGNOSIS — Z992 Dependence on renal dialysis: Secondary | ICD-10-CM | POA: Diagnosis not present

## 2016-10-22 NOTE — Progress Notes (Signed)
[facility]  Patient name: Bryan Wilkerson MRN: [id number] DOB: 1966-05-15 Sex: male  REASON FOR VISIT: post-op  HPI: Bryan Wilkerson is a 51 y.o. male who presents for his first postoperative visit status post left radiocephalic AV fistula placement on 07/20/2016 by Dr.Cain. He rescheduled his initial post-op visit from six weeks ago. Prior history of an occluded right radiocephalic AV fistula. Has been on peritoneal dialysis for 4 weeks. Still has right IJ [facility] which will be removed once dialysis adequacy achieved. His nephrologist is Dr. Justin Wilkerson.   He denies numbness or pain in his left hand. His incision has healed.  The medical issues include congestive heart failure, diabetes and hypertension. These are stable.  Past Medical History:  Diagnosis Date  . Anemia   . CHF (congestive heart failure) ([place])   . Chronic kidney disease    MWF  . Diabetes mellitus without complication ([place])    type 1  . Hypertension   . Pneumonia   . Psoriasis   . Retinopathy     Family History  Problem Relation Age of Onset  . Hypertension Mother   . Heart attack Mother     SOCIAL HISTORY: Social History  Substance Use Topics  . Smoking status: Never Smoker  . Smokeless tobacco: Never Used  . Alcohol use No    Allergies  Allergen Reactions  . Penicillins Other (See Comments)    UNSPECIFIED REACTION FROM CHILDHOOD Has patient had a PCN reaction causing immediate rash, facial/tongue/throat swelling, SOB or lightheadedness with hypotension:Yes Has patient had a PCN reaction causing severe rash involving mucus membranes or skin necrosis:No Has patient had a PCN reaction that required hospitalization:Yes Has patient had a PCN reaction occurring within the last 10 years:No If all of the above answers are "NO", then may proceed with Cephalosporin use.      Current Outpatient Prescriptions  Medication Sig Dispense Refill  . carvedilol (COREG) 25 MG tablet Take 25  mg by mouth 2 (two) times daily.    . clobetasol cream (TEMOVATE) 2.99 % 1 APPLICATION TWICE DAILY AS NEEDED FOR PSORASIS  0  . FOSRENOL 500 MG chewable tablet Chew 500 mg by mouth 3 (three) times daily with meals.     [name] glucose blood (CVS BLOOD GLUCOSE TEST STRIPS) test strip Use as instructed One touch Ultra test strips (Patient taking differently: 1 each by Other route daily. Use as instructed One touch Ultra test strips) 300 each 12  . insulin NPH-regular Human (NOVOLIN 70/30) (70-30) 100 UNIT/ML injection Inject 4 Units into the skin daily.     . multivitamin (RENA-VIT) TABS tablet Take 1 tablet by mouth daily.  6  . RENVELA 800 MG tablet Take 800 mg by mouth 3 (three) times daily with meals.     Delsa Grana 45 MG/0.5ML SOSY Inject 45 mg into the skin See admin instructions. EVERY 12 WEEKS    . triamcinolone ointment (KENALOG) 0.1 % Apply 1 application topically as needed (psorasis).     [name] oxyCODONE-acetaminophen (ROXICET) 5-325 MG tablet Take 1-2 tablets by mouth every 6 (six) hours as needed for severe pain. (Patient not taking: Reported on 10/22/2016) 6 tablet 0   No current facility-administered medications for this visit.     REVIEW OF SYSTEMS:  [X]  denotes positive finding, [ ]  denotes negative finding Cardiac  Comments:  Chest pain or chest pressure:    Shortness of breath upon exertion:    Short of breath when lying flat:  Irregular heart rhythm:        Vascular    Pain in calf, thigh, or hip brought on by ambulation:    Pain in feet at night that wakes you up from your sleep:     Blood clot in your veins:    Leg swelling:         Pulmonary    Oxygen at home:    Productive cough:     Wheezing:         Neurologic    Sudden weakness in arms or legs:     Sudden numbness in arms or legs:     Sudden onset of difficulty speaking or slurred speech:    Temporary loss of vision in one eye:     Problems with dizziness:         Gastrointestinal    Blood in stool:       Vomited blood:         Genitourinary    Burning when urinating:     Blood in urine:        Psychiatric    Major depression:         Hematologic    Bleeding problems:    Problems with blood clotting too easily:        Skin    Rashes or ulcers:        Constitutional    Fever or chills:      PHYSICAL EXAM: Vitals:   10/22/16 1420  BP: 99/62  Pulse: 76  Resp: 18  Temp: 97.2 F (36.2 C)  SpO2: 99%  Weight: 182 lb (82.6 kg)  Height: 6\' 3"  (1.905 m)    GENERAL: The patient is a well-nourished male, in no acute distress. The vital signs are documented above. HEENT: normocephalic, atraumatic. No abnormalities noted.  CARDIAC: There is a regular rate and rhythm. Right IJ TDC VASCULAR: 2+ left radial pulse. Palpable thrill left wrist fistula but is small in caliber throughout.  PULMONARY: There is good air exchange bilaterally without wheezing or rales. MUSCULOSKELETAL: There are no major deformities or cyanosis. NEUROLOGIC: No focal weakness or paresthesias are detected. SKIN: There are no ulcers or rashes noted. PSYCHIATRIC: The patient has a normal affect.  DATA:  Dialysis access duplex 10/22/16  0.25 cm at distal forearm, 0.28 cm at proximal forearm, 0.48 cm at antecubital fossa  MEDICAL ISSUES: Poorly maturing left radial-cephalic AV fistula  Currently on peritoneal dialysis without issues. Discussed with Dr. Bridgett Larsson who feels fistula has low likelihood of maturation but may benefit from fistulogram for further evaluation. This will be scheduled at the patient's convenience.   Bryan Jock, PA-C [facility] MD: Bridgett Larsson

## 2016-11-21 ENCOUNTER — Encounter (HOSPITAL_BASED_OUTPATIENT_CLINIC_OR_DEPARTMENT_OTHER): Payer: Self-pay | Admitting: Emergency Medicine

## 2016-11-21 ENCOUNTER — Emergency Department (HOSPITAL_BASED_OUTPATIENT_CLINIC_OR_DEPARTMENT_OTHER)
Admission: EM | Admit: 2016-11-21 | Discharge: 2016-11-22 | Disposition: A | Discharge status: Home / Self Care | Payer: 59 | Attending: Emergency Medicine | Admitting: Emergency Medicine

## 2016-11-21 DIAGNOSIS — M79604 Pain in right leg: Secondary | ICD-10-CM | POA: Insufficient documentation

## 2016-11-21 DIAGNOSIS — I132 Hypertensive heart and chronic kidney disease with heart failure and with stage 5 chronic kidney disease, or end stage renal disease: Secondary | ICD-10-CM | POA: Diagnosis not present

## 2016-11-21 DIAGNOSIS — N186 End stage renal disease: Secondary | ICD-10-CM | POA: Diagnosis not present

## 2016-11-21 DIAGNOSIS — I509 Heart failure, unspecified: Secondary | ICD-10-CM | POA: Diagnosis not present

## 2016-11-21 DIAGNOSIS — I10 Essential (primary) hypertension: Secondary | ICD-10-CM | POA: Diagnosis not present

## 2016-11-21 DIAGNOSIS — E1022 Type 1 diabetes mellitus with diabetic chronic kidney disease: Secondary | ICD-10-CM | POA: Diagnosis not present

## 2016-11-21 DIAGNOSIS — R251 Tremor, unspecified: Secondary | ICD-10-CM | POA: Diagnosis not present

## 2016-11-21 DIAGNOSIS — Z794 Long term (current) use of insulin: Secondary | ICD-10-CM | POA: Insufficient documentation

## 2016-11-21 DIAGNOSIS — R569 Unspecified convulsions: Secondary | ICD-10-CM | POA: Diagnosis not present

## 2016-11-21 DIAGNOSIS — E1065 Type 1 diabetes mellitus with hyperglycemia: Secondary | ICD-10-CM | POA: Diagnosis not present

## 2016-11-21 DIAGNOSIS — M79661 Pain in right lower leg: Secondary | ICD-10-CM | POA: Diagnosis not present

## 2016-11-21 NOTE — ED Provider Notes (Addendum)
[facility] DEPT M[facility] Provider Note   CSN: [id number] Arrival date & time: 11/21/16  2254  By signing my name below, I, Bryan Wilkerson, attest that this documentation has been prepared under the direction and in the presence of Veryl Speak, MD. Electronically Signed: Jeanell Wilkerson, Scribe. 11/21/2016. 11:41 PM.  History   Chief Complaint Chief Complaint  Patient presents with  . Leg Pain   The history is provided by the patient and a relative. No language interpreter was used.  Illness  This is a new problem. The current episode started more than 2 days ago. The problem occurs constantly. The problem has not changed since onset.Pertinent negatives include no headaches. Nothing aggravates the symptoms. Nothing relieves the symptoms. He has tried nothing for the symptoms.   HPI Comments: Bryan Wilkerson is a 51 y.o. male with a PMHx of DM and HTN who presents to the Emergency Department complaining of intermittent moderate RLE tremors that started about a week ago. He states that today his episodes worsened and increased in duration today, so he came to the ED. His tremors are relieved by straighten his RLE. His most recent episode lasted about 5-8 minutes. He reports associated RLE "muscles locking out" and tingling. He is currently on dialysis at home. Denies any prior hx of similar complaint, back pain, headache, or other complaints at this time.    PCP: Theodis Blaze, MD  Past Medical History:  Diagnosis Date  . Anemia   . CHF (congestive heart failure) ([place])   . Chronic kidney disease    MWF  . Diabetes mellitus without complication ([place])    type 1  . Hypertension   . Pneumonia   . Psoriasis   . Retinopathy     Patient Active Problem List   Diagnosis Date Noted  . ESRD on dialysis ([place]) 11/16/2013  . Pre-operative cardiovascular examination 11/12/2013  . Dyspnea 10/31/2013  . CHF (congestive heart failure), NYHA class II ([place]) 12/29/2012  . Essential hypertension,  benign 12/29/2012  . Hyperkalemia 12/29/2012  . Anemia 12/29/2012    Past Surgical History:  Procedure Laterality Date  . AV FISTULA PLACEMENT Right 12/05/2013   Procedure: RADIOCEPHALIC VS. BRACHIOCEPHALIC ARTERIOVENOUS (AV) FISTULA CREATION;  Surgeon: Conrad [place], MD;  Location: [facility];  Service: Vascular;  Laterality: Right;  . AV FISTULA PLACEMENT Left 07/20/2016   Procedure: LEFT ARM RADIOCEPHALIC ARTERIOVENOUS (AV) FISTULA CREATION;  Surgeon: Waynetta Sandy, MD;  Location: [facility];  Service: Vascular;  Laterality: Left;  . CATARACT EXTRACTION W/ INTRAOCULAR LENS  IMPLANT, BILATERAL    . EYE SURGERY     retina reattachment  . FISTULOGRAM Right 07/16/2014   Procedure: FISTULOGRAM;  Surgeon: Conrad [place], MD;  Location: [facility];  Service: Vascular;  Laterality: Right;  . Hemodialysis Catheter Left   . LIGATION OF COMPETING BRANCHES OF ARTERIOVENOUS FISTULA Right 07/16/2014   Procedure: LIGATION OF COMPETING BRANCHES OF ARTERIOVENOUS FISTULA;  Surgeon: Conrad [place], MD;  Location: [facility];  Service: Vascular;  Laterality: Right;       Home Medications    Prior to Admission medications   Medication Sig Start Date End Date Taking? Authorizing Provider  carvedilol (COREG) 25 MG tablet Take 25 mg by mouth 2 (two) times daily.    [provider]  clobetasol cream (TEMOVATE) 4.19 % 1 APPLICATION TWICE DAILY AS NEEDED FOR PSORASIS 04/04/16   [provider]  FOSRENOL 500 MG chewable tablet Chew 500 mg by mouth 3 (three) times daily  with meals.  06/06/14   [provider]  glucose blood (CVS BLOOD GLUCOSE TEST STRIPS) test strip Use as instructed One touch Ultra test strips Patient taking differently: 1 each by Other route daily. Use as instructed One touch Ultra test strips 11/21/13   Theodis Blaze, MD  insulin NPH-regular Human (NOVOLIN 70/30) (70-30) 100 UNIT/ML injection Inject 4 Units into the skin daily.     [provider]  multivitamin (RENA-VIT)  TABS tablet Take 1 tablet by mouth daily. 05/13/14   [provider]  oxyCODONE-acetaminophen (ROXICET) 5-325 MG tablet Take 1-2 tablets by mouth every 6 (six) hours as needed for severe pain. Patient not taking: Reported on 10/22/2016 07/20/16   Alvia Grove, PA-C  RENVELA 800 MG tablet Take 800 mg by mouth 3 (three) times daily with meals.  09/03/14   [provider]  STELARA 45 MG/0.5ML SOSY Inject 45 mg into the skin See admin instructions. EVERY 12 WEEKS 07/04/14   [provider]  triamcinolone ointment (KENALOG) 0.1 % Apply 1 application topically as needed (psorasis).  10/10/13   [provider]    Family History Family History  Problem Relation Age of Onset  . Hypertension Mother   . Heart attack Mother     Social History Social History  Substance Use Topics  . Smoking status: Never Smoker  . Smokeless tobacco: Never Used  . Alcohol use No     Allergies   Penicillins   Review of Systems Review of Systems  Constitutional: Negative for fever.  Musculoskeletal: Negative for back pain.  Neurological: Positive for tremors (RLE). Negative for headaches.  All other systems reviewed and are negative.    Physical Exam Updated Vital Signs BP (!) 195/109 (BP Location: Right Arm)   Pulse 79   Temp 98.3 F (36.8 C) (Oral)   Resp 18   Ht 6\' 2"  (1.88 m)   Wt 195 lb (88.5 kg)   SpO2 100%   BMI 25.04 kg/m   Physical Exam  Constitutional: He appears well-developed and well-nourished. No distress.  HENT:  Head: Normocephalic and atraumatic.  Eyes: Conjunctivae are normal.  Neck: Neck supple.  Cardiovascular: Normal rate.   Pulmonary/Chest: Effort normal.  Abdominal: Soft.  Musculoskeletal: Normal range of motion.  BLE appear grossly normal. No calf tenderness or edema. DP pulses are palpable bilaterally.   Neurological: He is alert.  DTRs are trace and symmetrical in BLE. Strength is 5/5 in BLE.   Skin: Skin is warm and dry.    Psychiatric: He has a normal mood and affect.  Nursing note and vitals reviewed.    ED Treatments / Results  DIAGNOSTIC STUDIES: Oxygen Saturation is 100% on RA, normal by my interpretation.    COORDINATION OF CARE: 11:45 PM- Pt advised of plan for treatment and pt agrees.  Labs (all labs ordered are listed, but only abnormal results are displayed) Labs Reviewed  BASIC METABOLIC PANEL  CBC WITH DIFFERENTIAL/PLATELET    EKG  EKG Interpretation None       Radiology No results found.  Procedures Procedures (including critical care time)  Medications Ordered in ED Medications - No data to display   Initial Impression / Assessment and Plan / ED Course  I have reviewed the triage vital signs and the nursing notes.  Pertinent labs & imaging results that were available during my care of the patient were reviewed by me and considered in my medical decision making (see chart for details).  Patient with  history of end-stage renal disease on peritoneal dialysis. He presents with complaints of right leg shaking. His physical examination is unremarkable. He has no other complaints, including no back pain or bowel or bladder issues. His strength and reflexes are symmetrical.  Laboratory studies reveal only an elevated sugar of nearly 500, however electrolytes and lab studies are at his baseline otherwise. He was given a subcutaneous dose of insulin and his sugars have improved somewhat.  I see no indication for further workup at this time. I've advised him to keep a record of his blood sugars and watch his carbohydrate intake. He does have an appointment to see his nephrologist in the next week and I have advised him to keep this appointment. If his symptoms worsen or change, he can return to the ER to be reevaluated.  ADDENDUM:  As patient was being discharged, he experienced another episode of shaking and cramping in his right leg. He began to have tonic-clonic like movements  when this episode occurred. The nurse called for help and I came to evaluate the patient. This episode had resolved just prior to my arrival in the patient's room so I did not see exactly what transpired. The patient did not appear to be post ictal and was alert and oriented after the episode. He did undergo a head CT prior to discharge which was negative. He is now ambulatory in the hallway and is in no distress. I see no indication for further workup at this time. He will be discharged, to follow-up with his primary doctor.  Final Clinical Impressions(s) / ED Diagnoses   Final diagnoses:  None    New Prescriptions New Prescriptions   No medications on file   I personally performed the services described in this documentation, which was scribed in my presence. The recorded information has been reviewed and is accurate.        Veryl Speak, MD 11/22/16 0973    Veryl Speak, MD 11/22/16 [phone]

## 2016-11-21 NOTE — ED Triage Notes (Signed)
PT presents to ED with complaints of right leg shaking and states his leg gave out. PT states he was driving tonight and it just "gave out" and he was unable to stand.

## 2016-11-22 ENCOUNTER — Emergency Department (HOSPITAL_COMMUNITY): Payer: 59

## 2016-11-22 ENCOUNTER — Inpatient Hospital Stay (HOSPITAL_COMMUNITY)
Admission: EM | Admit: 2016-11-22 | Discharge: 2016-11-27 | DRG: 100 | Disposition: A | Discharge status: Home / Self Care | Payer: 59 | Attending: Internal Medicine | Admitting: Internal Medicine

## 2016-11-22 ENCOUNTER — Emergency Department (HOSPITAL_BASED_OUTPATIENT_CLINIC_OR_DEPARTMENT_OTHER): Payer: 59

## 2016-11-22 ENCOUNTER — Encounter (HOSPITAL_COMMUNITY): Payer: Self-pay

## 2016-11-22 DIAGNOSIS — Z9841 Cataract extraction status, right eye: Secondary | ICD-10-CM

## 2016-11-22 DIAGNOSIS — E118 Type 2 diabetes mellitus with unspecified complications: Secondary | ICD-10-CM

## 2016-11-22 DIAGNOSIS — I132 Hypertensive heart and chronic kidney disease with heart failure and with stage 5 chronic kidney disease, or end stage renal disease: Secondary | ICD-10-CM | POA: Diagnosis present

## 2016-11-22 DIAGNOSIS — E1165 Type 2 diabetes mellitus with hyperglycemia: Secondary | ICD-10-CM | POA: Diagnosis present

## 2016-11-22 DIAGNOSIS — N186 End stage renal disease: Secondary | ICD-10-CM | POA: Diagnosis not present

## 2016-11-22 DIAGNOSIS — R569 Unspecified convulsions: Principal | ICD-10-CM | POA: Diagnosis present

## 2016-11-22 DIAGNOSIS — Z9842 Cataract extraction status, left eye: Secondary | ICD-10-CM

## 2016-11-22 DIAGNOSIS — Z961 Presence of intraocular lens: Secondary | ICD-10-CM | POA: Diagnosis present

## 2016-11-22 DIAGNOSIS — Z794 Long term (current) use of insulin: Secondary | ICD-10-CM | POA: Diagnosis not present

## 2016-11-22 DIAGNOSIS — I10 Essential (primary) hypertension: Secondary | ICD-10-CM | POA: Diagnosis not present

## 2016-11-22 DIAGNOSIS — I959 Hypotension, unspecified: Secondary | ICD-10-CM | POA: Diagnosis present

## 2016-11-22 DIAGNOSIS — E1122 Type 2 diabetes mellitus with diabetic chronic kidney disease: Secondary | ICD-10-CM | POA: Diagnosis present

## 2016-11-22 DIAGNOSIS — Z79899 Other long term (current) drug therapy: Secondary | ICD-10-CM

## 2016-11-22 DIAGNOSIS — E11319 Type 2 diabetes mellitus with unspecified diabetic retinopathy without macular edema: Secondary | ICD-10-CM | POA: Diagnosis present

## 2016-11-22 DIAGNOSIS — I509 Heart failure, unspecified: Secondary | ICD-10-CM

## 2016-11-22 DIAGNOSIS — N2581 Secondary hyperparathyroidism of renal origin: Secondary | ICD-10-CM | POA: Diagnosis present

## 2016-11-22 DIAGNOSIS — D631 Anemia in chronic kidney disease: Secondary | ICD-10-CM | POA: Diagnosis present

## 2016-11-22 DIAGNOSIS — I5032 Chronic diastolic (congestive) heart failure: Secondary | ICD-10-CM | POA: Diagnosis present

## 2016-11-22 DIAGNOSIS — Z992 Dependence on renal dialysis: Secondary | ICD-10-CM

## 2016-11-22 DIAGNOSIS — Z880 Allergy status to penicillin: Secondary | ICD-10-CM

## 2016-11-22 DIAGNOSIS — E871 Hypo-osmolality and hyponatremia: Secondary | ICD-10-CM | POA: Diagnosis present

## 2016-11-22 DIAGNOSIS — E876 Hypokalemia: Secondary | ICD-10-CM | POA: Diagnosis not present

## 2016-11-22 DIAGNOSIS — M47812 Spondylosis without myelopathy or radiculopathy, cervical region: Secondary | ICD-10-CM | POA: Diagnosis present

## 2016-11-22 HISTORY — DX: Type 1 diabetes mellitus without complications: E10.9

## 2016-11-22 HISTORY — DX: Type 2 diabetes mellitus with unspecified diabetic retinopathy without macular edema: E11.319

## 2016-11-22 HISTORY — DX: Dependence on renal dialysis: N18.6

## 2016-11-22 HISTORY — DX: End stage renal disease: Z99.2

## 2016-11-22 LAB — CBC WITH DIFFERENTIAL/PLATELET
BASOS ABS: 0 10*3/uL (ref 0.0–0.1)
BASOS ABS: 0 10*3/uL (ref 0.0–0.1)
BASOS PCT: 0 %
BASOS PCT: 1 %
EOS ABS: 0.5 10*3/uL (ref 0.0–0.7)
EOS ABS: 0.6 10*3/uL (ref 0.0–0.7)
EOS PCT: 7 %
EOS PCT: 8 %
HCT: 33.4 % — ABNORMAL LOW (ref 39.0–52.0)
HEMATOCRIT: 32.5 % — AB (ref 39.0–52.0)
HEMOGLOBIN: 11.4 g/dL — AB (ref 13.0–17.0)
Hemoglobin: 11.4 g/dL — ABNORMAL LOW (ref 13.0–17.0)
LYMPHS ABS: 1.2 10*3/uL (ref 0.7–4.0)
Lymphocytes Relative: 16 %
Lymphocytes Relative: 21 %
Lymphs Abs: 1.5 10*3/uL (ref 0.7–4.0)
MCH: 28.7 pg (ref 26.0–34.0)
MCH: 29.5 pg (ref 26.0–34.0)
MCHC: 34.1 g/dL (ref 30.0–36.0)
MCHC: 35.1 g/dL (ref 30.0–36.0)
MCV: 84 fL (ref 78.0–100.0)
MCV: 84.1 fL (ref 78.0–100.0)
MONO ABS: 1.1 10*3/uL — AB (ref 0.1–1.0)
MONOS PCT: 14 %
Monocytes Absolute: 0.8 10*3/uL (ref 0.1–1.0)
Monocytes Relative: 10 %
NEUTROS ABS: 4.1 10*3/uL (ref 1.7–7.7)
NEUTROS PCT: 67 %
Neutro Abs: 5.3 10*3/uL (ref 1.7–7.7)
Neutrophils Relative %: 56 %
PLATELETS: 172 10*3/uL (ref 150–400)
PLATELETS: 183 10*3/uL (ref 150–400)
RBC: 3.87 MIL/uL — ABNORMAL LOW (ref 4.22–5.81)
RBC: 3.97 MIL/uL — AB (ref 4.22–5.81)
RDW: 14.9 % (ref 11.5–15.5)
RDW: 15.8 % — ABNORMAL HIGH (ref 11.5–15.5)
WBC: 7.3 10*3/uL (ref 4.0–10.5)
WBC: 7.8 10*3/uL (ref 4.0–10.5)

## 2016-11-22 LAB — BASIC METABOLIC PANEL
Anion gap: 11 (ref 5–15)
BUN: 49 mg/dL — ABNORMAL HIGH (ref 6–20)
CALCIUM: 7 mg/dL — AB (ref 8.9–10.3)
CO2: 26 mmol/L (ref 22–32)
CREATININE: 16.2 mg/dL — AB (ref 0.61–1.24)
Chloride: 92 mmol/L — ABNORMAL LOW (ref 101–111)
GFR, EST AFRICAN AMERICAN: 3 mL/min — AB (ref >60)
GFR, EST NON AFRICAN AMERICAN: 3 mL/min — AB (ref >60)
GLUCOSE: 490 mg/dL — AB (ref 65–99)
Potassium: 3.9 mmol/L (ref 3.5–5.1)
Sodium: 129 mmol/L — ABNORMAL LOW (ref 135–145)

## 2016-11-22 LAB — COMPREHENSIVE METABOLIC PANEL
ALK PHOS: 119 U/L (ref 38–126)
ALT: 15 U/L — AB (ref 17–63)
AST: 26 U/L (ref 15–41)
Albumin: 2.5 g/dL — ABNORMAL LOW (ref 3.5–5.0)
Anion gap: 14 (ref 5–15)
BUN: 55 mg/dL — AB (ref 6–20)
CALCIUM: 7 mg/dL — AB (ref 8.9–10.3)
CHLORIDE: 95 mmol/L — AB (ref 101–111)
CO2: 25 mmol/L (ref 22–32)
CREATININE: 18.25 mg/dL — AB (ref 0.61–1.24)
GFR, EST AFRICAN AMERICAN: 3 mL/min — AB (ref >60)
GFR, EST NON AFRICAN AMERICAN: 3 mL/min — AB (ref >60)
Glucose, Bld: 297 mg/dL — ABNORMAL HIGH (ref 65–99)
Potassium: 3.5 mmol/L (ref 3.5–5.1)
Sodium: 134 mmol/L — ABNORMAL LOW (ref 135–145)
Total Bilirubin: 0.6 mg/dL (ref 0.3–1.2)
Total Protein: 6.2 g/dL — ABNORMAL LOW (ref 6.5–8.1)

## 2016-11-22 LAB — I-STAT CHEM 8, ED
BUN: 55 mg/dL — ABNORMAL HIGH (ref 6–20)
CALCIUM ION: 0.89 mmol/L — AB (ref 1.15–1.40)
CHLORIDE: 95 mmol/L — AB (ref 101–111)
Creatinine, Ser: >18 mg/dL — ABNORMAL HIGH (ref 0.61–1.24)
GLUCOSE: 288 mg/dL — AB (ref 65–99)
HCT: 35 % — ABNORMAL LOW (ref 39.0–52.0)
Hemoglobin: 11.9 g/dL — ABNORMAL LOW (ref 13.0–17.0)
Potassium: 3.5 mmol/L (ref 3.5–5.1)
Sodium: 137 mmol/L (ref 135–145)
TCO2: 26 mmol/L (ref 0–100)

## 2016-11-22 LAB — CBG MONITORING, ED: GLUCOSE-CAPILLARY: 464 mg/dL — AB (ref 65–99)

## 2016-11-22 LAB — CK: Total CK: 770 U/L — ABNORMAL HIGH (ref 49–397)

## 2016-11-22 LAB — MAGNESIUM: Magnesium: 1.7 mg/dL (ref 1.7–2.4)

## 2016-11-22 LAB — I-STAT TROPONIN, ED: Troponin i, poc: 0.04 ng/mL (ref 0.00–0.08)

## 2016-11-22 MED ORDER — CALCIUM GLUCONATE 10 % IV SOLN
1.0000 g | Freq: Once | INTRAVENOUS | Status: AC
  Administered 2016-11-23: 1 g via INTRAVENOUS
  Filled 2016-11-22: qty 10

## 2016-11-22 MED ORDER — INSULIN ASPART 100 UNIT/ML ~~LOC~~ SOLN
10.0000 [IU] | Freq: Once | SUBCUTANEOUS | Status: AC
  Administered 2016-11-22: 10 [IU] via SUBCUTANEOUS
  Filled 2016-11-22: qty 1

## 2016-11-22 MED ORDER — INSULIN REGULAR HUMAN 100 UNIT/ML IJ SOLN
10.0000 [IU] | Freq: Once | INTRAMUSCULAR | Status: AC
  Administered 2016-11-22: 10 [IU] via SUBCUTANEOUS
  Filled 2016-11-22: qty 1

## 2016-11-22 MED ORDER — VALPROATE SODIUM 500 MG/5ML IV SOLN
1500.0000 mg | Freq: Once | INTRAVENOUS | Status: AC
  Administered 2016-11-23: 1500 mg via INTRAVENOUS
  Filled 2016-11-22: qty 15

## 2016-11-22 MED ORDER — HYDRALAZINE HCL 20 MG/ML IJ SOLN
10.0000 mg | Freq: Once | INTRAMUSCULAR | Status: AC
  Administered 2016-11-22: 10 mg via INTRAVENOUS
  Filled 2016-11-22: qty 1

## 2016-11-22 MED ORDER — VALPROATE SODIUM 500 MG/5ML IV SOLN
1000.0000 mg | Freq: Once | INTRAVENOUS | Status: DC
  Filled 2016-11-22: qty 10

## 2016-11-22 MED ORDER — LABETALOL HCL 5 MG/ML IV SOLN
10.0000 mg | Freq: Once | INTRAVENOUS | Status: AC
  Administered 2016-11-22: 10 mg via INTRAVENOUS
  Filled 2016-11-22: qty 4

## 2016-11-22 NOTE — ED Notes (Signed)
Patient would like blood drawn from port or IV.

## 2016-11-22 NOTE — ED Notes (Signed)
Bed: XO32 Expected date:  Expected time:  Means of arrival:  Comments: 51 yr old right leg twitching, HTN

## 2016-11-22 NOTE — ED Triage Notes (Signed)
Pt arrived Via EMS from home and is c/o  Right leg pain reported as a  "funny sensations" describes twitching, cramping, pain, and contracting that is felt in his muscle does not cause leg to shake. . Pain onset occurs with ambulation. Pt also has a c-collar in place as he had a fall with first leg episode today; pt reports that  hit his head and has left side neck pain. Pt was seen yesterday at [facility] and had a CT scan and blood work but per patient no anomalies were noted.  Pt receives home dialysis and has a right arm fistula.  has not taken  BP medication today 196/119, HR 83, 100% RA, CBG 300

## 2016-11-22 NOTE — ED Notes (Signed)
Called to room by family member. Pt was laying across stretcher and whole body was jerking. Pt was repositioned and the jerking stopped. He was alert and responding to questions. Dr. Stark Jock to bedside. Further orders given.

## 2016-11-22 NOTE — ED Provider Notes (Signed)
[facility] DEPT Provider Note   CSN: [id number] Arrival date & time: 11/22/16  2026     History   Chief Complaint Chief Complaint  Patient presents with  . Leg Pain    HPI Erek Kowal is a 51 y.o. male history of CHF, CK D, diabetes, hypertension or presenting with possible seizure-like activity. Patient states that yesterday, he had an episode of twitching of the right lower extremity. He went to the ER and had another witnessed episode of twitching in the right lower extremity. CT head was done and was unremarkable and labs showed glucose 500. Patient was given insulin and sent home. He is on peritoneal dialysis every night. He had another two episodes of R leg twitching and then whole body twitching for about 3 minutes.   The history is provided by the patient.    Past Medical History:  Diagnosis Date  . Anemia   . CHF (congestive heart failure) ([place])   . Chronic kidney disease    MWF  . Diabetes mellitus without complication ([place])    type 1  . Hypertension   . Pneumonia   . Psoriasis   . Retinopathy     Patient Active Problem List   Diagnosis Date Noted  . Observed seizure-like activity ([place]) 11/22/2016  . ESRD on dialysis ([place]) 11/16/2013  . Pre-operative cardiovascular examination 11/12/2013  . Dyspnea 10/31/2013  . CHF (congestive heart failure), NYHA class II ([place]) 12/29/2012  . Essential hypertension, benign 12/29/2012  . Hyperkalemia 12/29/2012  . Anemia 12/29/2012    Past Surgical History:  Procedure Laterality Date  . AV FISTULA PLACEMENT Right 12/05/2013   Procedure: RADIOCEPHALIC VS. BRACHIOCEPHALIC ARTERIOVENOUS (AV) FISTULA CREATION;  Surgeon: Conrad Carpinteria, MD;  Location: [facility];  Service: Vascular;  Laterality: Right;  . AV FISTULA PLACEMENT Left 07/20/2016   Procedure: LEFT ARM RADIOCEPHALIC ARTERIOVENOUS (AV) FISTULA CREATION;  Surgeon: Waynetta Sandy, MD;  Location: [facility];  Service: Vascular;  Laterality: Left;  . CATARACT  EXTRACTION W/ INTRAOCULAR LENS  IMPLANT, BILATERAL    . EYE SURGERY     retina reattachment  . FISTULOGRAM Right 07/16/2014   Procedure: FISTULOGRAM;  Surgeon: Conrad Ramblewood, MD;  Location: [facility];  Service: Vascular;  Laterality: Right;  . Hemodialysis Catheter Left   . LIGATION OF COMPETING BRANCHES OF ARTERIOVENOUS FISTULA Right 07/16/2014   Procedure: LIGATION OF COMPETING BRANCHES OF ARTERIOVENOUS FISTULA;  Surgeon: Conrad Hahnville, MD;  Location: [facility];  Service: Vascular;  Laterality: Right;       Home Medications    Prior to Admission medications   Medication Sig Start Date End Date Taking? Authorizing Provider  calcium acetate (PHOSLO) 667 MG capsule Take 1,334 mg by mouth 3 (three) times daily with meals.   Yes [provider]  carvedilol (COREG) 25 MG tablet Take 25 mg by mouth 2 (two) times daily.   Yes [provider]  hydrALAZINE (APRESOLINE) 100 MG tablet Take 100 mg by mouth 3 (three) times daily.   Yes [provider]  insulin NPH-regular Human (NOVOLIN 70/30) (70-30) 100 UNIT/ML injection Inject 4 Units into the skin daily.    Yes [provider]  STELARA 45 MG/0.5ML SOSY Inject 45 mg into the skin See admin instructions. EVERY 12 WEEKS 07/04/14  Yes [provider]  triamcinolone ointment (KENALOG) 0.1 % Apply 1 application topically as needed (psorasis).  10/10/13  Yes [provider]  glucose blood ([employer] BLOOD GLUCOSE TEST STRIPS) test strip Use as  instructed One touch Ultra test strips Patient not taking: Reported on 11/22/2016 11/21/13   Theodis Blaze, MD  oxyCODONE-acetaminophen (ROXICET) 5-325 MG tablet Take 1-2 tablets by mouth every 6 (six) hours as needed for severe pain. Patient not taking: Reported on 11/22/2016 07/20/16   Alvia Grove PA-C    Family History Family History  Problem Relation Age of Onset  . Hypertension Mother   . Heart attack Mother     Social History Social History  Substance Use  Topics  . Smoking status: Never Smoker  . Smokeless tobacco: Never Used  . Alcohol use No     Allergies   Penicillins   Review of Systems Review of Systems  Neurological: Positive for seizures.  All other systems reviewed and are negative.    Physical Exam Updated Vital Signs BP (!) 209/111 (BP Location: Right Arm)   Pulse 81   Temp 98 F (36.7 C) (Oral)   Resp 16   SpO2 100%   Physical Exam  Constitutional: He is oriented to person, place, and time.  Chronically ill appearing, alert and oriented   HENT:  Head: Normocephalic and atraumatic.  Mouth/Throat: Oropharynx is clear and moist.  Eyes: EOM are normal. Pupils are equal, round, and reactive to light.  Neck:  Mild L paracervical tenderness   Cardiovascular: Normal rate, regular rhythm and normal heart sounds.   Pulmonary/Chest: Effort normal and breath sounds normal. No respiratory distress. He has no wheezes. He has no rales.  Abdominal: Soft. Bowel sounds are normal. He exhibits no distension. There is no tenderness.  Peritoneal dialysis catheter in place with yellowish fluid   Musculoskeletal: Normal range of motion.  Neurological: He is alert and oriented to person, place, and time. No cranial nerve deficit. Coordination normal.  Skin: Skin is warm.  Psychiatric: He has a normal mood and affect.  Nursing note and vitals reviewed.    ED Treatments / Results  Labs (all labs ordered are listed, but only abnormal results are displayed) Labs Reviewed  CBC WITH DIFFERENTIAL/PLATELET - Abnormal; Notable for the following:       Result Value   RBC 3.97 (*)    Hemoglobin 11.4 (*)    HCT 33.4 (*)    RDW 15.8 (*)    All other components within normal limits  COMPREHENSIVE METABOLIC PANEL - Abnormal; Notable for the following:    Sodium 134 (*)    Chloride 95 (*)    Glucose, Bld 297 (*)    BUN 55 (*)    Creatinine, Ser 18.25 (*)    Calcium 7.0 (*)    Total Protein 6.2 (*)    Albumin 2.5 (*)    ALT 15  (*)    GFR calc non Af Amer 3 (*)    GFR calc Af Amer 3 (*)    All other components within normal limits  CK - Abnormal; Notable for the following:    Total CK 770 (*)    All other components within normal limits  I-STAT CHEM 8, ED - Abnormal; Notable for the following:    Chloride 95 (*)    BUN 55 (*)    Creatinine, Ser >18.00 (*)    Glucose, Bld 288 (*)    Calcium, Ion 0.89 (*)    Hemoglobin 11.9 (*)    HCT 35.0 (*)    All other components within normal limits  MAGNESIUM  PHOSPHORUS  I-STAT TROPOININ, ED    EKG  EKG Interpretation  Date/Time:  Monday Nov 22 2016 22:57:16 EDT Ventricular Rate:  74 PR Interval:    QRS Duration: 95 QT Interval:  419 QTC Calculation: 465 R Axis:   13 Text Interpretation:  Sinus rhythm Atrial premature complex Probable left atrial enlargement RSR' in V1 or V2, right VCD or RVH Nonspecific T abnormalities, lateral leads Borderline ST elevation, anterior leads No significant change since last tracing Confirmed by Shylie Polo  MD, Eyleen Rawlinson ([id number]) on 11/22/2016 11:16:07 PM       Radiology Dg Cervical Spine Complete  Result Date: 11/22/2016 CLINICAL DATA:  Seizure activity with neck pain, initial encounter EXAM: CERVICAL SPINE - COMPLETE 4+ VIEW COMPARISON:  None. FINDINGS: Seven cervical segments are well visualized. Vertebral body height is well maintained. Disc space narrowing is noted at C5-6 with endplate sclerosis and mild retrolisthesis of C5 with respect to C6. No soft tissue abnormality is noted. The neural foramina are widely patent bilaterally. Dialysis catheter is noted on the right. The odontoid is within normal limits. IMPRESSION: Degenerative changes at C5-6.  No acute abnormality noted. Electronically Signed   By: Inez Catalina M.D.   On: 11/22/2016 21:18   Ct Head Wo Contrast  Result Date: 11/22/2016 CLINICAL DATA:  Right lower extremity tremors EXAM: CT HEAD WITHOUT CONTRAST TECHNIQUE: Contiguous axial images were obtained from the base of  the skull through the vertex without intravenous contrast. COMPARISON:  None. FINDINGS: Brain: No mass lesion, intraparenchymal hemorrhage or extra-axial collection. No evidence of acute cortical infarct. Brain parenchyma and CSF-containing spaces are normal for age. Vascular: No hyperdense vessel or unexpected calcification. Skull: Normal visualized skull base, calvarium and extracranial soft tissues. Sinuses/Orbits: Near complete opacification of the right maxillary sinus by high density secretions. Moderate opacification of the right frontal and ethmoid sinuses. Normal orbits. IMPRESSION: 1. Normal brain.  No acute intracranial abnormality. 2. Right ostiomeatal complex pattern sinonasal obstruction. Electronically Signed   By: Ulyses Jarred M.D.   On: 11/22/2016 03:01    Procedures Procedures (including critical care time)  Medications Ordered in ED Medications  insulin aspart (novoLOG) injection 10 Units (not administered)  valproate (DEPACON) 1,500 mg in dextrose 5 % 50 mL IVPB (not administered)  labetalol (NORMODYNE,TRANDATE) injection 10 mg (10 mg Intravenous Given 11/22/16 2249)  hydrALAZINE (APRESOLINE) injection 10 mg (10 mg Intravenous Given 11/22/16 2250)     Initial Impression / Assessment and Plan / ED Course  I have reviewed the triage vital signs and the nursing notes.  Pertinent labs & imaging results that were available during my care of the patient were reviewed by me and considered in my medical decision making (see chart for details).     Yader Criger is a 51 y.o. male here with possible partial complex seizure. Had 4 seizures in 24 hrs (one before ED visit, one during last ED visit, and 2 since then). No hx of seizure. CT head yesterday unremarkable. Will repeat labs. Will consult neuro.   11:52 PM Repeat labs showed K 3.5. Mag nl. Cr 18, slightly higher than baseline. Called Dr. Cheral Marker, who recommend depakote load 20 mg/kg then 5 mg/kg x TID. Also recommend MRI  brain, EEG. States that if MRI and EEG are abnormal then consult neuro to see patient. Patient is a dialysis patient so will need transfer to [facility]. I talked to Dr. Mercy Moore, who reviewed labs. Patient doesn't have emergent need for dialysis so will be set up in AM. Dr. Roel Cluck to admit. Depakote load ordered.    Final Clinical Impressions(s) /  ED Diagnoses   Final diagnoses:  Seizure [facility])    New Prescriptions New Prescriptions   No medications on file     Drenda Freeze, MD 11/22/16 2352

## 2016-11-22 NOTE — H&P (Signed)
Bryan Wilkerson XBJ:[id number] DOB: [birth year] DOA: 11/22/2016     PCP: Theodis Blaze, MD   Outpatient Specialists: Gertie Baron, nephrologist is Dr. Justin Mend.  Patient coming from:  home Lives With family   Chief Complaint: Seizure like activity  HPI: Bryan Wilkerson is a 51 y.o. male with medical history significant of  ESRD on peritoneal dialysis, status post left radiocephalic AV fistula placement on 07/20/2016 by Dr.Chen CHF(last ECHO normal EF), DM , HTN, Anemia  Presented today to [facility] with right leg cramping and locking up. At first he has been able to push it down. Feels like the muscles twisting.  On emergency department yesterday developed a whole body shaking when he was repositioned shaking has stopped patient remained alert and oriented during this episode. But he reports once blacking out. He felt like he could not breath.  CT of the head at that time showed no acute changes his blood sugar at that time was 500. It was thought that it may have been contributing to his symptoms he was treated and discharged to home. Osseous episode of generalized twitching patient was slightly confused but that rapidly resolved. Today he came back a candidate of right leg twitching and cramping pain.  She usually gets peritoneal dialysis daily but has not done today secondary to being in the hospital. His last episode was 6 hours ago states it never happens when he is laying in bed but only when ambulates.    Regarding pertinent Chronic problems: CHF last Echo 09/23/2015 Wall thickness was   increased in a pattern of moderate LVH LV EF: 60% -   65%  IN ER:  Temp (24hrs), Avg:98 F (36.7 C), Min:98 F (36.7 C), Max:98 F (36.7 C)     RR 16 100% HR 81 BP 209/111 now down to 175/90 NA 137 K 3.5 BUN 55 Cr>18 glucose 288 Ca ionized 0.89 calcium 7.0 al 2.5 Hg 11.9 CK 770 Cervical spine Degenerative changes at C5-6. No acute abnormality noted.  Following Medications were  ordered in ER: Medications  insulin aspart (novoLOG) injection 10 Units (not administered)  valproate (DEPACON) 1,000 mg in dextrose 5 % 50 mL IVPB (not administered)  labetalol (NORMODYNE,TRANDATE) injection 10 mg (10 mg Intravenous Given 11/22/16 2249)  hydrALAZINE (APRESOLINE) injection 10 mg (10 mg Intravenous Given 11/22/16 2250)     ER provider discussed case with: Dr.Linzen neurology who recommended loading with Depakote and then continue TID, order EEG and MRI and call neurology in AM if abnormal  Hospitalist was called for admission for Recurrent seizure activity  Review of Systems:    Pertinent positives include: tremor  Constitutional:  No weight loss, night sweats, Fevers, chills, fatigue, weight loss  HEENT:  No headaches, Difficulty swallowing,Tooth/dental problems,Sore throat,  No sneezing, itching, ear ache, nasal congestion, post nasal drip,  Cardio-vascular:  No chest pain, Orthopnea, PND, anasarca, dizziness, palpitations.no Bilateral lower extremity swelling  GI:  No heartburn, indigestion, abdominal pain, nausea, vomiting, diarrhea, change in bowel habits, loss of appetite, melena, blood in stool, hematemesis Resp:  no shortness of breath at rest. No dyspnea on exertion, No excess mucus, no productive cough, No non-productive cough, No coughing up of blood.No change in color of mucus.No wheezing. Skin:  no rash or lesions. No jaundice GU:  no dysuria, change in color of urine, no urgency or frequency. No straining to urinate.  No flank pain.  Musculoskeletal:  No joint pain or no joint swelling. No decreased  range of motion. No back pain.  Psych:  No change in mood or affect. No depression or anxiety. No memory loss.  Neuro: no localizing neurological complaints, no tingling, no weakness, no double vision, no gait abnormality, no slurred speech, no confusion  As per HPI otherwise 10 point review of systems negative.   Past Medical History: Past Medical  History:  Diagnosis Date  . Anemia   . CHF (congestive heart failure) ([place])   . Chronic kidney disease    MWF  . Diabetes mellitus without complication ([place])    type 1  . Hypertension   . Pneumonia   . Psoriasis   . Retinopathy    Past Surgical History:  Procedure Laterality Date  . AV FISTULA PLACEMENT Right 12/05/2013   Procedure: RADIOCEPHALIC VS. BRACHIOCEPHALIC ARTERIOVENOUS (AV) FISTULA CREATION;  Surgeon: Conrad Bainbridge, MD;  Location: [facility];  Service: Vascular;  Laterality: Right;  . AV FISTULA PLACEMENT Left 07/20/2016   Procedure: LEFT ARM RADIOCEPHALIC ARTERIOVENOUS (AV) FISTULA CREATION;  Surgeon: Waynetta Sandy, MD;  Location: [facility];  Service: Vascular;  Laterality: Left;  . CATARACT EXTRACTION W/ INTRAOCULAR LENS  IMPLANT, BILATERAL    . EYE SURGERY     retina reattachment  . FISTULOGRAM Right 07/16/2014   Procedure: FISTULOGRAM;  Surgeon: Conrad Noxapater, MD;  Location: [facility];  Service: Vascular;  Laterality: Right;  . Hemodialysis Catheter Left   . LIGATION OF COMPETING BRANCHES OF ARTERIOVENOUS FISTULA Right 07/16/2014   Procedure: LIGATION OF COMPETING BRANCHES OF ARTERIOVENOUS FISTULA;  Surgeon: Conrad Montrose, MD;  Location: [facility];  Service: Vascular;  Laterality: Right;     Social History:  Ambulatory   independently      reports that he has never smoked. He has never used smokeless tobacco. He reports that he does not drink alcohol or use drugs.  Allergies:   Allergies  Allergen Reactions  . Penicillins Other (See Comments)    UNSPECIFIED REACTION FROM CHILDHOOD Has patient had a PCN reaction causing immediate rash, facial/tongue/throat swelling, SOB or lightheadedness with hypotension:Yes Has patient had a PCN reaction causing severe rash involving mucus membranes or skin necrosis:No Has patient had a PCN reaction that required hospitalization:Yes Has patient had a PCN reaction occurring within the last 10 years:No If all of the above answers are  "NO", then may proceed with Cephalosporin use.         Family History:   Family History  Problem Relation Age of Onset  . Hypertension Mother   . Heart attack Mother     Medications: Prior to Admission medications   Medication Sig Start Date End Date Taking? Authorizing Provider  calcium acetate (PHOSLO) 667 MG capsule Take 1,334 mg by mouth 3 (three) times daily with meals.   Yes [provider]  carvedilol (COREG) 25 MG tablet Take 25 mg by mouth 2 (two) times daily.   Yes [provider]  hydrALAZINE (APRESOLINE) 100 MG tablet Take 100 mg by mouth 3 (three) times daily.   Yes [provider]  insulin NPH-regular Human (NOVOLIN 70/30) (70-30) 100 UNIT/ML injection Inject 4 Units into the skin daily.    Yes [provider]  STELARA 45 MG/0.5ML SOSY Inject 45 mg into the skin See admin instructions. EVERY 12 WEEKS 07/04/14  Yes [provider]  triamcinolone ointment (KENALOG) 0.1 % Apply 1 application topically as needed (psorasis).  10/10/13  Yes [provider]  glucose blood ([employer] BLOOD GLUCOSE TEST STRIPS) test strip  Use as instructed One touch Ultra test strips Patient not taking: Reported on 11/22/2016 11/21/13   Theodis Blaze, MD  oxyCODONE-acetaminophen (ROXICET) 5-325 MG tablet Take 1-2 tablets by mouth every 6 (six) hours as needed for severe pain. Patient not taking: Reported on 11/22/2016 07/20/16   Ansel Bong    Physical Exam: Patient Vitals for the past 24 hrs:  BP Temp Temp src Pulse Resp SpO2  11/22/16 2035 (!) 209/111 98 F (36.7 C) Oral 81 16 100 %    1. General:  in No Acute distress 2. Psychological: Alert and   Oriented 3. Head/ENT:    Dry Mucous Membranes                          Head Non traumatic, neck supple                            Poor Dentition 4. SKIN:  decreased Skin turgor,  Skin clean Dry and intact no rash 5. Heart: Regular rate and rhythm no  Murmur, Rub or gallop 6. Lungs:   no wheezes or crackles   7. Abdomen: Soft,  non-tender, Non distended 8. Lower extremities: no clubbing, cyanosis, or edema 9. Neurologically   strength 5 out of 5 in all 4 extremities cranial nerves II through XII intact 10. MSK: Normal range of motion   body mass index is unknown because there is no height or weight on file.  Labs on Admission:   Labs on Admission: I have personally reviewed following labs and imaging studies  CBC:  Recent Labs Lab 11/22/16 0001 11/22/16 2039 11/22/16 2301  WBC 7.3 7.8  --   NEUTROABS 4.1 5.3  --   HGB 11.4* 11.4* 11.9*  HCT 32.5* 33.4* 35.0*  MCV 84.0 84.1  --   PLT 172 183  --    Basic Metabolic Panel:  Recent Labs Lab 11/22/16 0001 11/22/16 2039 11/22/16 2301  NA 129* 134* 137  K 3.9 3.5 3.5  CL 92* 95* 95*  CO2 26 25  --   GLUCOSE 490* 297* 288*  BUN 49* 55* 55*  CREATININE 16.20* 18.25* >18.00*  CALCIUM 7.0* 7.0*  --    GFR: CrCl cannot be calculated (This lab value cannot be used to calculate CrCl because it is not a number: >18.00). Liver Function Tests:  Recent Labs Lab 11/22/16 2039  AST 26  ALT 15*  ALKPHOS 119  BILITOT 0.6  PROT 6.2*  ALBUMIN 2.5*   No results for input(s): LIPASE, AMYLASE in the last 168 hours. No results for input(s): AMMONIA in the last 168 hours. Coagulation Profile: No results for input(s): INR, PROTIME in the last 168 hours. Cardiac Enzymes:  Recent Labs Lab 11/22/16 2039  CKTOTAL 770*   BNP (last 3 results) No results for input(s): PROBNP in the last 8760 hours. HbA1C: No results for input(s): HGBA1C in the last 72 hours. CBG:  Recent Labs Lab 11/22/16 0213  GLUCAP 464*   Lipid Profile: No results for input(s): CHOL, HDL, LDLCALC, TRIG, CHOLHDL, LDLDIRECT in the last 72 hours. Thyroid Function Tests: No results for input(s): TSH, T4TOTAL, FREET4, T3FREE, THYROIDAB in the last 72 hours. Anemia Panel: No results for input(s): VITAMINB12, FOLATE, FERRITIN, TIBC,  IRON, RETICCTPCT in the last 72 hours. Urine analysis:    Component Value Date/Time   COLORURINE YELLOW 12/30/2012 [place] 12/30/2012 0241  LABSPEC 1.010 12/30/2012 0241   PHURINE 6.5 12/30/2012 0241   GLUCOSEU NEGATIVE 12/30/2012 0241   HGBUR SMALL (A) 12/30/2012 0241   BILIRUBINUR NEGATIVE 12/30/2012 0241   KETONESUR NEGATIVE 12/30/2012 0241   PROTEINUR 100 (A) 12/30/2012 0241   UROBILINOGEN 1.0 12/30/2012 0241   NITRITE NEGATIVE 12/30/2012 0241   LEUKOCYTESUR NEGATIVE 12/30/2012 0241   Sepsis Labs: @LABRCNTIP (procalcitonin:4,lacticidven:4) )No results found for this or any previous visit (from the past 240 hour(s)).    UA anuric  Lab Results  Component Value Date   HGBA1C 6.5 (H) 12/30/2012    CrCl cannot be calculated (This lab value cannot be used to calculate CrCl because it is not a number: >18.00).  BNP (last 3 results) No results for input(s): PROBNP in the last 8760 hours.   ECG REPORT  Independently reviewed Rate:74   Rhythm: NSR ST&T Change: No acute ischemic changes  ST segment changes unchanged from 2016 QTC 465 QRS 94 There were no vitals filed for this visit.   Cultures: No results found for: SDES, SPECREQUEST, CULT, REPTSTATUS   Radiological Exams on Admission: Dg Cervical Spine Complete  Result Date: 11/22/2016 CLINICAL DATA:  Seizure activity with neck pain, initial encounter EXAM: CERVICAL SPINE - COMPLETE 4+ VIEW COMPARISON:  None. FINDINGS: Seven cervical segments are well visualized. Vertebral body height is well maintained. Disc space narrowing is noted at C5-6 with endplate sclerosis and mild retrolisthesis of C5 with respect to C6. No soft tissue abnormality is noted. The neural foramina are widely patent bilaterally. Dialysis catheter is noted on the right. The odontoid is within normal limits. IMPRESSION: Degenerative changes at C5-6.  No acute abnormality noted. Electronically Signed   By: Inez Catalina M.D.   On:  11/22/2016 21:18   Ct Head Wo Contrast  Result Date: 11/22/2016 CLINICAL DATA:  Right lower extremity tremors EXAM: CT HEAD WITHOUT CONTRAST TECHNIQUE: Contiguous axial images were obtained from the base of the skull through the vertex without intravenous contrast. COMPARISON:  None. FINDINGS: Brain: No mass lesion, intraparenchymal hemorrhage or extra-axial collection. No evidence of acute cortical infarct. Brain parenchyma and CSF-containing spaces are normal for age. Vascular: No hyperdense vessel or unexpected calcification. Skull: Normal visualized skull base, calvarium and extracranial soft tissues. Sinuses/Orbits: Near complete opacification of the right maxillary sinus by high density secretions. Moderate opacification of the right frontal and ethmoid sinuses. Normal orbits. IMPRESSION: 1. Normal brain.  No acute intracranial abnormality. 2. Right ostiomeatal complex pattern sinonasal obstruction. Electronically Signed   By: Ulyses Jarred M.D.   On: 11/22/2016 03:01    Chart has been reviewed    Assessment/Plan  51 y.o. male with medical history significant of    ESRD on peritoneal dialysis, status post left radiocephalic AV fistula placement on 07/20/2016 by Dr.Chen CHF (last ECHO normal EF), DM , HTN, Anemia being admitted for seizure like activity vs tremors in the setting of hypocalcemia  Present on Admission:  Seizure-like activity versus tremors secondary to hypocalcemia. As per recommendation of neurology will start Depakote order EEG and MRI unfortunately MRI has to be result contrast secondary to end-stage renal disease. IWould benefit from neurology consult in the morning. Order seizure precautions  . Hypocalcemia may explain some degree of tremors will replace appreciate nephrology consult regarding long-term management monitor on telemetry obtain serial EKG repeat in the morning . Essential hypertension, benign - continue home medications currently better controlled . DM  (diabetes mellitus), type 2 with complications ([place]) continue home regimen of insulin order  sliding scale and monitor Other plan as per orders. End-stage renal disease on PD - dates fistula is still not matured but does have HD catheter in place defer to nephrology regarding further management DVT prophylaxis:  SCD   Code Status:  FULL CODE   as per patient    Family Communication:   Family  at  Bedside  plan of care was discussed with  Wife,   Disposition Plan:       To home once workup is complete and patient is stable                                                Consults called: Nephrology     Admission status:   inpatient       Level of care   tele      I have spent a total of 66 min on this admission  extra time was spent to discuss case with nephrology   [place] 11/23/2016, 12:58 AM    [facility]  Pager [phone]   after 2 AM please page floor coverage PA If 7AM-7PM, please contact the day team taking care of the patient  [email or web address]  Password [email or web address]

## 2016-11-23 ENCOUNTER — Observation Stay (HOSPITAL_COMMUNITY): Payer: 59

## 2016-11-23 ENCOUNTER — Encounter (HOSPITAL_COMMUNITY): Payer: Self-pay | Admitting: Internal Medicine

## 2016-11-23 DIAGNOSIS — Z9842 Cataract extraction status, left eye: Secondary | ICD-10-CM | POA: Diagnosis not present

## 2016-11-23 DIAGNOSIS — N186 End stage renal disease: Secondary | ICD-10-CM | POA: Diagnosis not present

## 2016-11-23 DIAGNOSIS — I5032 Chronic diastolic (congestive) heart failure: Secondary | ICD-10-CM | POA: Diagnosis present

## 2016-11-23 DIAGNOSIS — E1165 Type 2 diabetes mellitus with hyperglycemia: Secondary | ICD-10-CM | POA: Diagnosis present

## 2016-11-23 DIAGNOSIS — E118 Type 2 diabetes mellitus with unspecified complications: Secondary | ICD-10-CM | POA: Diagnosis not present

## 2016-11-23 DIAGNOSIS — Z794 Long term (current) use of insulin: Secondary | ICD-10-CM | POA: Diagnosis not present

## 2016-11-23 DIAGNOSIS — Z9841 Cataract extraction status, right eye: Secondary | ICD-10-CM | POA: Diagnosis not present

## 2016-11-23 DIAGNOSIS — D631 Anemia in chronic kidney disease: Secondary | ICD-10-CM | POA: Diagnosis present

## 2016-11-23 DIAGNOSIS — R569 Unspecified convulsions: Secondary | ICD-10-CM | POA: Diagnosis not present

## 2016-11-23 DIAGNOSIS — I959 Hypotension, unspecified: Secondary | ICD-10-CM | POA: Diagnosis present

## 2016-11-23 DIAGNOSIS — Z880 Allergy status to penicillin: Secondary | ICD-10-CM | POA: Diagnosis not present

## 2016-11-23 DIAGNOSIS — E871 Hypo-osmolality and hyponatremia: Secondary | ICD-10-CM | POA: Diagnosis present

## 2016-11-23 DIAGNOSIS — N2581 Secondary hyperparathyroidism of renal origin: Secondary | ICD-10-CM | POA: Diagnosis present

## 2016-11-23 DIAGNOSIS — I132 Hypertensive heart and chronic kidney disease with heart failure and with stage 5 chronic kidney disease, or end stage renal disease: Secondary | ICD-10-CM | POA: Diagnosis present

## 2016-11-23 DIAGNOSIS — M47812 Spondylosis without myelopathy or radiculopathy, cervical region: Secondary | ICD-10-CM | POA: Diagnosis present

## 2016-11-23 DIAGNOSIS — Z79899 Other long term (current) drug therapy: Secondary | ICD-10-CM | POA: Diagnosis not present

## 2016-11-23 DIAGNOSIS — Z992 Dependence on renal dialysis: Secondary | ICD-10-CM | POA: Diagnosis not present

## 2016-11-23 DIAGNOSIS — Z961 Presence of intraocular lens: Secondary | ICD-10-CM | POA: Diagnosis present

## 2016-11-23 DIAGNOSIS — E11319 Type 2 diabetes mellitus with unspecified diabetic retinopathy without macular edema: Secondary | ICD-10-CM | POA: Diagnosis present

## 2016-11-23 DIAGNOSIS — I10 Essential (primary) hypertension: Secondary | ICD-10-CM | POA: Diagnosis not present

## 2016-11-23 DIAGNOSIS — E1122 Type 2 diabetes mellitus with diabetic chronic kidney disease: Secondary | ICD-10-CM | POA: Diagnosis present

## 2016-11-23 DIAGNOSIS — E876 Hypokalemia: Secondary | ICD-10-CM | POA: Diagnosis not present

## 2016-11-23 LAB — COMPREHENSIVE METABOLIC PANEL
ALBUMIN: 2.4 g/dL — AB (ref 3.5–5.0)
ALT: 15 U/L — AB (ref 17–63)
AST: 25 U/L (ref 15–41)
Alkaline Phosphatase: 121 U/L (ref 38–126)
Anion gap: 12 (ref 5–15)
BUN: 59 mg/dL — AB (ref 6–20)
CHLORIDE: 98 mmol/L — AB (ref 101–111)
CO2: 27 mmol/L (ref 22–32)
CREATININE: 18.37 mg/dL — AB (ref 0.61–1.24)
Calcium: 7.4 mg/dL — ABNORMAL LOW (ref 8.9–10.3)
GFR calc Af Amer: 3 mL/min — ABNORMAL LOW (ref >60)
GFR, EST NON AFRICAN AMERICAN: 3 mL/min — AB (ref >60)
Glucose, Bld: 153 mg/dL — ABNORMAL HIGH (ref 65–99)
POTASSIUM: 3.9 mmol/L (ref 3.5–5.1)
Sodium: 137 mmol/L (ref 135–145)
Total Bilirubin: 0.5 mg/dL (ref 0.3–1.2)
Total Protein: 6.4 g/dL — ABNORMAL LOW (ref 6.5–8.1)

## 2016-11-23 LAB — CBC
HEMATOCRIT: 35.1 % — AB (ref 39.0–52.0)
Hemoglobin: 11.5 g/dL — ABNORMAL LOW (ref 13.0–17.0)
MCH: 28.3 pg (ref 26.0–34.0)
MCHC: 32.8 g/dL (ref 30.0–36.0)
MCV: 86.2 fL (ref 78.0–100.0)
Platelets: 196 10*3/uL (ref 150–400)
RBC: 4.07 MIL/uL — ABNORMAL LOW (ref 4.22–5.81)
RDW: 16.2 % — AB (ref 11.5–15.5)
WBC: 7.8 10*3/uL (ref 4.0–10.5)

## 2016-11-23 LAB — HIV ANTIBODY (ROUTINE TESTING W REFLEX): HIV Screen 4th Generation wRfx: NONREACTIVE

## 2016-11-23 LAB — TROPONIN I
Troponin I: 0.08 ng/mL (ref <0.03)
Troponin I: 0.07 ng/mL (ref <0.03)
Troponin I: 0.06 ng/mL (ref <0.03)

## 2016-11-23 LAB — GLUCOSE, CAPILLARY
GLUCOSE-CAPILLARY: 83 mg/dL (ref 65–99)
GLUCOSE-CAPILLARY: 139 mg/dL — AB (ref 65–99)
GLUCOSE-CAPILLARY: 286 mg/dL — AB (ref 65–99)
Glucose-Capillary: 151 mg/dL — ABNORMAL HIGH (ref 65–99)
Glucose-Capillary: 180 mg/dL — ABNORMAL HIGH (ref 65–99)

## 2016-11-23 LAB — MAGNESIUM: Magnesium: 2 mg/dL (ref 1.7–2.4)

## 2016-11-23 LAB — PHOSPHORUS
PHOSPHORUS: 6 mg/dL — AB (ref 2.5–4.6)
Phosphorus: 5.6 mg/dL — ABNORMAL HIGH (ref 2.5–4.6)

## 2016-11-23 LAB — TSH: TSH: 2.125 u[IU]/mL (ref 0.350–4.500)

## 2016-11-23 MED ORDER — MAGNESIUM SULFATE IN D5W 1-5 GM/100ML-% IV SOLN
1.0000 g | Freq: Once | INTRAVENOUS | Status: AC
  Administered 2016-11-23: 1 g via INTRAVENOUS
  Filled 2016-11-23: qty 100

## 2016-11-23 MED ORDER — HYDRALAZINE HCL 50 MG PO TABS
100.0000 mg | ORAL_TABLET | Freq: Three times a day (TID) | ORAL | Status: DC
  Administered 2016-11-23 – 2016-11-24 (×4): 100 mg via ORAL
  Filled 2016-11-23 (×4): qty 2

## 2016-11-23 MED ORDER — CALCIUM ACETATE (PHOS BINDER) 667 MG PO CAPS
1334.0000 mg | ORAL_CAPSULE | Freq: Three times a day (TID) | ORAL | Status: DC
  Administered 2016-11-23 (×2): 1334 mg via ORAL
  Filled 2016-11-23 (×2): qty 2

## 2016-11-23 MED ORDER — VALPROATE SODIUM 500 MG/5ML IV SOLN
500.0000 mg | Freq: Three times a day (TID) | INTRAVENOUS | Status: DC
  Administered 2016-11-23 – 2016-11-25 (×7): 500 mg via INTRAVENOUS
  Filled 2016-11-23 (×8): qty 5

## 2016-11-23 MED ORDER — ONDANSETRON HCL 4 MG PO TABS: 4.0000 mg | ORAL_TABLET | Freq: Four times a day (QID) | ORAL | Status: DC | PRN

## 2016-11-23 MED ORDER — INSULIN ASPART PROT & ASPART (70-30 MIX) 100 UNIT/ML ~~LOC~~ SUSP
4.0000 [IU] | Freq: Every day | SUBCUTANEOUS | Status: DC
  Administered 2016-11-24 – 2016-11-27 (×4): 4 [IU] via SUBCUTANEOUS
  Filled 2016-11-23: qty 10

## 2016-11-23 MED ORDER — ONDANSETRON HCL 4 MG/2ML IJ SOLN: 4.0000 mg | Freq: Four times a day (QID) | INTRAMUSCULAR | Status: DC | PRN

## 2016-11-23 MED ORDER — ACETAMINOPHEN 325 MG PO TABS: 650.0000 mg | ORAL_TABLET | Freq: Four times a day (QID) | ORAL | Status: DC | PRN

## 2016-11-23 MED ORDER — TRIAMCINOLONE ACETONIDE 0.1 % EX CREA
TOPICAL_CREAM | Freq: Three times a day (TID) | CUTANEOUS | Status: DC
  Administered 2016-11-24 – 2016-11-27 (×9): via TOPICAL
  Filled 2016-11-23 (×2): qty 15

## 2016-11-23 MED ORDER — CALCIUM ACETATE (PHOS BINDER) 667 MG PO CAPS
2001.0000 mg | ORAL_CAPSULE | Freq: Three times a day (TID) | ORAL | Status: DC
  Administered 2016-11-23 – 2016-11-26 (×9): 2001 mg via ORAL
  Filled 2016-11-23 (×9): qty 3

## 2016-11-23 MED ORDER — SODIUM CHLORIDE 0.9% FLUSH
3.0000 mL | Freq: Two times a day (BID) | INTRAVENOUS | Status: DC
  Administered 2016-11-23 – 2016-11-27 (×6): 3 mL via INTRAVENOUS

## 2016-11-23 MED ORDER — MAGNESIUM SULFATE 50 % IJ SOLN: 1.0000 g | Freq: Once | INTRAMUSCULAR | Status: DC

## 2016-11-23 MED ORDER — SODIUM CHLORIDE 0.9 % IV SOLN: 250.0000 mL | INTRAVENOUS | Status: DC | PRN

## 2016-11-23 MED ORDER — SODIUM CHLORIDE 0.9% FLUSH: 3.0000 mL | INTRAVENOUS | Status: DC | PRN

## 2016-11-23 MED ORDER — GENTAMICIN SULFATE 0.1 % EX CREA
1.0000 "application " | TOPICAL_CREAM | Freq: Every day | CUTANEOUS | Status: DC
  Administered 2016-11-24 – 2016-11-27 (×4): 1 via TOPICAL
  Filled 2016-11-23 (×3): qty 15

## 2016-11-23 MED ORDER — HEPARIN 1000 UNIT/ML FOR PERITONEAL DIALYSIS: 500.0000 [IU] | INTRAMUSCULAR | Status: DC | PRN

## 2016-11-23 MED ORDER — HEPARIN 1000 UNIT/ML FOR PERITONEAL DIALYSIS
INTRAPERITONEAL | Status: DC | PRN
  Filled 2016-11-23: qty 5000

## 2016-11-23 MED ORDER — DELFLEX-LC/2.5% DEXTROSE 394 MOSM/L IP SOLN: INTRAPERITONEAL | Status: DC

## 2016-11-23 MED ORDER — SODIUM CHLORIDE 0.9% FLUSH
3.0000 mL | Freq: Two times a day (BID) | INTRAVENOUS | Status: DC
  Administered 2016-11-23 – 2016-11-27 (×8): 3 mL via INTRAVENOUS

## 2016-11-23 MED ORDER — CARVEDILOL 25 MG PO TABS
25.0000 mg | ORAL_TABLET | Freq: Two times a day (BID) | ORAL | Status: DC
  Administered 2016-11-23 – 2016-11-24 (×3): 25 mg via ORAL
  Filled 2016-11-23 (×3): qty 1

## 2016-11-23 MED ORDER — LORAZEPAM 2 MG/ML IJ SOLN: 1.0000 mg | INTRAMUSCULAR | Status: DC | PRN

## 2016-11-23 MED ORDER — INSULIN ASPART 100 UNIT/ML ~~LOC~~ SOLN
0.0000 [IU] | SUBCUTANEOUS | Status: DC
  Administered 2016-11-23: 5 [IU] via SUBCUTANEOUS
  Administered 2016-11-23: 1 [IU] via SUBCUTANEOUS
  Administered 2016-11-23 (×2): 2 [IU] via SUBCUTANEOUS
  Administered 2016-11-24: 7 [IU] via SUBCUTANEOUS
  Administered 2016-11-24: 1 [IU] via SUBCUTANEOUS
  Administered 2016-11-25 (×2): 2 [IU] via SUBCUTANEOUS
  Administered 2016-11-25: 3 [IU] via SUBCUTANEOUS
  Administered 2016-11-25 – 2016-11-26 (×4): 2 [IU] via SUBCUTANEOUS
  Administered 2016-11-26: 1 [IU] via SUBCUTANEOUS
  Administered 2016-11-26: 3 [IU] via SUBCUTANEOUS
  Administered 2016-11-26: 1 [IU] via SUBCUTANEOUS
  Administered 2016-11-26 – 2016-11-27 (×2): 2 [IU] via SUBCUTANEOUS
  Administered 2016-11-27: 1 [IU] via SUBCUTANEOUS
  Administered 2016-11-27: 2 [IU] via SUBCUTANEOUS

## 2016-11-23 MED ORDER — DEXTROSE 5 % IV SOLN
15.0000 mg/kg/d | Freq: Three times a day (TID) | INTRAVENOUS | Status: DC
  Filled 2016-11-23: qty 4.43

## 2016-11-23 MED ORDER — ACETAMINOPHEN 650 MG RE SUPP: 650.0000 mg | Freq: Four times a day (QID) | RECTAL | Status: DC | PRN

## 2016-11-23 NOTE — Progress Notes (Signed)
Inpatient Diabetes Program Recommendations  AACE/ADA: New Consensus Statement on Inpatient Glycemic Control (2015)  Target Ranges:  Prepandial:   less than 140 mg/dL      Peak postprandial:   less than 180 mg/dL (1-2 hours)      Critically ill patients:  140 - 180 mg/dL  Results for Bryan Wilkerson, Bryan Wilkerson (MRN [id number]) as of 11/23/2016 11:23  Ref. Range 11/22/2016 02:13 11/23/2016 03:48 11/23/2016 08:38  Glucose-Capillary Latest Ref Range: 65 - 99 mg/dL 464 (H)  Novolog 10 units 151 (H)  Novolog 2 units 83  Results for Bryan Wilkerson, Bryan Wilkerson (MRN [id number]) as of 11/23/2016 11:23  Ref. Range 11/22/2016 00:01 11/22/2016 20:39 11/22/2016 23:01 11/23/2016 03:59  Glucose Latest Ref Range: 65 - 99 mg/dL 490 (H) 297 (H)  Novolog 10 units 288 (H) 153 (H)    Review of Glycemic Control  Diabetes history: DM1 (per H&P) Outpatient Diabetes medications: 70/30 5 units with breakfast (per patient's wife he skips it a few times a week depending on glucose) Current orders for Inpatient glycemic control: 70/30 4 units QAM with breakfast, Novolog 0-9 units Q4H  Inpatient Diabetes Program Recommendations: Insulin - Basal: In reveiwing chart, noted 70/30 4 units with breakfast was NOT GIVEN today (with charted reason as 'ordered parameters not met'. Per patient's wife, patient does not always take 70/30 and make end up skipping a few times a week depending on how glucose is trending.  A1C: A1C in process.  NOTE: Called patient's room to speak with patient regarding outpatient regimen for DM control. Patient's wife reports that patient has gone for EEG at this time. Patient's wife reports that patient takes 70/30 5 units with breakfast (depending on glucose). She stated that patient is able to skip 70/30 a few times a week and has been known to skip it for a full week in the past. She also reports that patient has hypoglycemia at times when he takes the 70/30. A1C in process at this time. Noted initial glucose 490 mg/dl on  11/22/16. Patient likely needs DM medication to keep glucose more consistent and to prevent episodes of hypoglycemia as well. If patient has DM1, he should be requiring basal, correction, and meal coverage insulin. Will follow up on A1C results on 11/24/16.  Thanks, Barnie Alderman, RN, MSN, CDE Diabetes Coordinator Inpatient Diabetes Program [phone] (Team Pager from 8am to 5pm)

## 2016-11-23 NOTE — Consult Note (Signed)
[facility] Consultation Note Requesting Physician:  Dr. Darrick Meigs Primary Nephrologist: Justin Mend (PD) Reason for Consult:  Provision of dialysis for ESRD  HPI: The patient is a 51 y.o. year-old AAM. PMH DM, HTN, secondary HPT, anemia. Previously on HD, recent transition to peritoneal dialysis ([facility]). Gives history starting a couple of weeks ago of severe drawing/cramping of RLE with subsequent feeling of "flimsy legs", hard to walk d/t weak. Says walking can precipitate. Can feel it coming on. Never loses consciousness. No perioral paresthesias or issues with UE's. Came to ED with these sx. Was initially placed on Depakote and EEG/MRI ordered. Neuro is following  Lab wise, has had some mild hypocalcemia and hyperphosphatemia but not to extent that would cause hypocalcemic tetany (and no sx in UE's or paresthesias). Of note elevated CPK 770 on 5/21.  Pt transitioned to PD about a month ago. No issues with his exchanges. Most recent clearance studies from the outpt unit were done this past Friday with KT/V of 2.18 (indicative of adequate clearance).    Past Medical History:  Diagnosis Date  . Anemia   . CHF (congestive heart failure) ([place])   . Chronic kidney disease    MWF  . Diabetes mellitus without complication ([place])    type 1  . Hypertension   . Pneumonia   . Psoriasis   . Retinopathy     Past Surgical History:  Procedure Laterality Date  . AV FISTULA PLACEMENT Right 12/05/2013   Procedure: RADIOCEPHALIC VS. BRACHIOCEPHALIC ARTERIOVENOUS (AV) FISTULA CREATION;  Surgeon: Conrad [place], MD;  Location: [place];  Service: Vascular;  Laterality: Right;  . AV FISTULA PLACEMENT Left 07/20/2016   Procedure: LEFT ARM RADIOCEPHALIC ARTERIOVENOUS (AV) FISTULA CREATION;  Surgeon: Waynetta Sandy, MD;  Location: [place];  Service: Vascular;  Laterality: Left;  . CATARACT EXTRACTION W/ INTRAOCULAR LENS  IMPLANT, BILATERAL    . EYE SURGERY     retina reattachment  . FISTULOGRAM Right 07/16/2014   Procedure:  FISTULOGRAM;  Surgeon: Conrad [place], MD;  Location: [place];  Service: Vascular;  Laterality: Right;  . Hemodialysis Catheter Left   . LIGATION OF COMPETING BRANCHES OF ARTERIOVENOUS FISTULA Right 07/16/2014   Procedure: LIGATION OF COMPETING BRANCHES OF ARTERIOVENOUS FISTULA;  Surgeon: Conrad [place]foot, MD;  Location: [place];  Service: Vascular;  Laterality: Right;    Family History  Problem Relation Age of Onset  . Hypertension Mother   . Heart attack Mother   . Chronic Renal Failure Neg Hx   . Diabetes Neg Hx   . Stroke Neg Hx   . Cancer Neg Hx    Social History:  reports that he has never smoked. He has never used smokeless tobacco. He reports that he does not drink alcohol or use drugs.   Allergies  Allergen Reactions  . Penicillins Other (See Comments)    UNSPECIFIED REACTION FROM CHILDHOOD Has patient had a PCN reaction causing immediate rash, facial/tongue/throat swelling, SOB or lightheadedness with hypotension:Yes Has patient had a PCN reaction causing severe rash involving mucus membranes or skin necrosis:No Has patient had a PCN reaction that required hospitalization:Yes Has patient had a PCN reaction occurring within the last 10 years:No If all of the above answers are "NO", then may proceed with Cephalosporin use.      Prior to Admission medications   Medication Sig Start Date End Date Taking? Authorizing Provider  calcium acetate (PHOSLO) 667 MG capsule Take 1,334 mg by mouth 3 (three) times daily with meals.  Yes [provider]  carvedilol (COREG) 25 MG tablet Take 25 mg by mouth 2 (two) times daily.   Yes [provider]  hydrALAZINE (APRESOLINE) 100 MG tablet Take 100 mg by mouth 3 (three) times daily.   Yes [provider]  insulin NPH-regular Human (NOVOLIN 70/30) (70-30) 100 UNIT/ML injection Inject 4 Units into the skin daily.    Yes [provider]  STELARA 45 MG/0.5ML SOSY Inject 45 mg into the skin See admin instructions.  EVERY 12 WEEKS 07/04/14  Yes [provider]  triamcinolone ointment (KENALOG) 0.1 % Apply 1 application topically as needed (psorasis).  10/10/13  Yes [provider]  glucose blood ([facility] BLOOD GLUCOSE TEST STRIPS) test strip Use as instructed One touch Ultra test strips Patient not taking: Reported on 11/22/2016 11/21/13   Theodis Blaze, MD  oxyCODONE-acetaminophen (ROXICET) 5-325 MG tablet Take 1-2 tablets by mouth every 6 (six) hours as needed for severe pain. Patient not taking: Reported on 11/22/2016 07/20/16   Alvia Grove, PA-C    Inpatient medications: . calcium acetate  2,001 mg Oral TID WC  . carvedilol  25 mg Oral BID WC  . gentamicin cream  1 application Topical Daily  . hydrALAZINE  100 mg Oral TID  . insulin aspart  0-9 Units Subcutaneous Q4H  . insulin aspart protamine- aspart  4 Units Subcutaneous Q breakfast  . sodium chloride flush  3 mL Intravenous Q12H  . sodium chloride flush  3 mL Intravenous Q12H    Review of Systems See HPI    Physical Exam:  Blood pressure (!) 150/78, pulse 83, temperature 98.1 F (36.7 C), temperature source Oral, resp. rate 18, height 6\' 2"  (1.88 m), weight 86.2 kg (190 lb), SpO2 95 %. Very nice AAM, NAD VS as noted R IJ HD cath in place No JVD or bruits Lungs clear without crackles or wheezes No JVD Regular rhythm S1S2 No S3 Abd soft, PD cath with dressing in place No edema of LE's No asterixus or myoclonus L FA AVF in place (never used) Alert, oriented, conversant  Dialysis Access: PD catheter, R IH HD catheter, L AVF (maturing)   Recent Labs Lab 11/22/16 0001 11/22/16 2039 11/22/16 2301 11/22/16 2309 11/23/16 0359  NA 129* 134* 137  --  137  K 3.9 3.5 3.5  --  3.9  CL 92* 95* 95*  --  98*  CO2 26 25  --   --  27  GLUCOSE 490* 297* 288*  --  153*  BUN 49* 55* 55*  --  59*  CREATININE 16.20* 18.25* >18.00*  --  18.37*  CALCIUM 7.0* 7.0*  --   --  7.4*  PHOS  --   --   --  5.6* 6.0*    Recent  Labs Lab 11/22/16 2039 11/23/16 0359  AST 26 25  ALT 15* 15*  ALKPHOS 119 121  BILITOT 0.6 0.5  PROT 6.2* 6.4*  ALBUMIN 2.5* 2.4*    Recent Labs Lab 11/22/16 0001 11/22/16 2039 11/22/16 2301 11/23/16 0359  WBC 7.3 7.8  --  7.8  NEUTROABS 4.1 5.3  --   --   HGB 11.4* 11.4* 11.9* 11.5*  HCT 32.5* 33.4* 35.0* 35.1*  MCV 84.0 84.1  --  86.2  PLT 172 183  --  196    Recent Labs Lab 11/22/16 2039 11/23/16 0359 11/23/16 1004  CKTOTAL 770*  --   --   TROPONINI  --  0.08* 0.07*  Recent Labs Lab 11/22/16 0213 11/23/16 0348 11/23/16 0838 11/23/16 1212  GLUCAP 464* 151* 83 139*    Xrays/Other Studies: Dg Cervical Spine Complete  Result Date: 11/22/2016 CLINICAL DATA:  Seizure activity with neck pain, initial encounter EXAM: CERVICAL SPINE - COMPLETE 4+ VIEW COMPARISON:  None. FINDINGS: Seven cervical segments are well visualized. Vertebral body height is well maintained. Disc space narrowing is noted at C5-6 with endplate sclerosis and mild retrolisthesis of C5 with respect to C6. No soft tissue abnormality is noted. The neural foramina are widely patent bilaterally. Dialysis catheter is noted on the right. The odontoid is within normal limits. IMPRESSION: Degenerative changes at C5-6.  No acute abnormality noted. Electronically Signed   By: Inez Catalina M.D.   On: 11/22/2016 21:18   Ct Head Wo Contrast  Result Date: 11/22/2016 CLINICAL DATA:  Right lower extremity tremors EXAM: CT HEAD WITHOUT CONTRAST TECHNIQUE: Contiguous axial images were obtained from the base of the skull through the vertex without intravenous contrast. COMPARISON:  None. FINDINGS: Brain: No mass lesion, intraparenchymal hemorrhage or extra-axial collection. No evidence of acute cortical infarct. Brain parenchyma and CSF-containing spaces are normal for age. Vascular: No hyperdense vessel or unexpected calcification. Skull: Normal visualized skull base, calvarium and extracranial soft tissues.  Sinuses/Orbits: Near complete opacification of the right maxillary sinus by high density secretions. Moderate opacification of the right frontal and ethmoid sinuses. Normal orbits. IMPRESSION: 1. Normal brain.  No acute intracranial abnormality. 2. Right ostiomeatal complex pattern sinonasal obstruction. Electronically Signed   By: Ulyses Jarred M.D.   On: 11/22/2016 03:01   Mr Brain Wo Contrast  Result Date: 11/23/2016 CLINICAL DATA:  51 year old male with seizure like activity. Right lower extremity twitching. Hyperglycemia at presentation. EXAM: MRI HEAD WITHOUT CONTRAST TECHNIQUE: Multiplanar, multiecho pulse sequences of the brain and surrounding structures were obtained without intravenous contrast. COMPARISON:  Head CT without contrast 11/22/2016. FINDINGS: Brain: No restricted diffusion or evidence of acute infarction. No cerebral edema identified. Evidence of a chronic lacunar infarct in the central right thalamus. Mild patchy and scattered mostly periventricular and central cerebral white matter T2 and FLAIR hyperintensity which is mildly or moderately advanced for age. Possible small chronic micro hemorrhages in the left periventricular white matter and right frontal operculum (series 9, images 14 and 15). Hippocampal formations and mesial temporal lobe structures appear symmetric and within normal limits. No midline shift, mass effect, evidence of mass lesion, ventriculomegaly, extra-axial collection or acute intracranial hemorrhage. Cervicomedullary junction and pituitary are within normal limits. Vascular: Major intracranial vascular flow voids are preserved. Skull and upper cervical spine: Heterogeneous decreased T1 bone marrow signal at the skullbase and in the visible cervical spine. Calvarium bone marrow signal is normal. No destructive osseous lesion identified. Sinuses/Orbits: Asymmetric T2 hyperintensity throughout the left optic nerve from anterior to the chiasm (series 11, images 22  through 27) suggesting atrophy. Postoperative changes to both globes. Other orbits soft tissues appear normal. Moderate to severe paranasal sinus mucosal thickening primarily on the right side and sparing the sphenoid sinuses. Other: Visible internal auditory structures appear normal. Mastoids are clear. Negative scalp soft tissues. IMPRESSION: 1.  No acute intracranial abnormality identified. 2. 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 3. Evidence of left optic nerve atrophy. 4. Moderate to severe paranasal sinus mucosal thickening primarily on the right.  Electronically Signed   By: Genevie Ann M.D.   On: 11/23/2016 09:12   PD Rx:  5 exchanges /24 hours. 2.5 liters. Dwell 1 hour 30 minutes, Last fill 2.5 liters, drain in 4 hours. No daytime dwell.   Assessment/Recommendations  1. Leg cramping/"drawing" without LOC - etiology not clear to me. Remains awake at all times. Mild hypocalcemia but not enough to cause tetany (and no perioral numbness, Cvostek's or Trousseau's sign). (Has elevated CPK ? etiology and ? If consequence of severe sustained muscle contraction). Neuro evaluating. 2. ESRD - recent transition to [facility]CPD. Despite creatinine of 18, most recent clearance studies from his [facility] showed KT/V 2.1 which should reflect adequate clearance. Continue current CCPD. 3. Hypocalcemia/hyperphosphatemia - both mild. On Ca acetate. Increase dose to 3 ac. 4. Elevated CPK - ? If related to what sounds like sustained muscle contraction episodes (similar to the elevated CKw e see after t-c sz). Recheck AM.  5. HTN - I see that carvedilol/hydralazine started (was taken off BP meds at [facility] a couple weeks ago). Planned to use all 2.5% dialysate for elev BP. Not sure will need regimen quite this  aggressive.   6. DM - insulin per primary service   Jamal Maes,  MD [facility] [phone] pager 11/23/2016, 2:39 PM

## 2016-11-23 NOTE — Progress Notes (Signed)
[facility]  PROGRESS NOTE  Bryan Wilkerson DVV:[id number] DOB: 03-Apr-1966 DOA: 11/22/2016 PCP: Theodis Blaze, MD   Brief HPI:   51 year old male with history of ESRD on peritoneal dialysis, status post left radiocephalic AV fistula placement on 07/20/2016, diabetes mellitus, hypertension, anemia who initially came to admit him to [facility] fifth right leg cramping and locking up with shaking all 4 body. Patient denies loss of consciousness. Patient was discharged from the ED and then came back to [facility] with similar complaints. Neurology was consulted by the ED physician and patient was started on Depakote and EEG MRI was ordered.    Subjective   This morning patient denies any more similar episodes. He says these episodes happen usually when he walks around. He denies loss of consciousness during these episodes. Denies biting his tongue or bladder incontinence.   Assessment/Plan:     1. Seizure-like activity versus muscle spasm- we'll consult neurology as patient has been started on Depakote, EEG and MRI ordered. Continue seizure precautions. 2. Hypocalcemia- I was calcium was 0.89 on 11/22/2016, phosphorus elevated to 6.0 today. Nephrology has been consulted. We will await the recommendations. 3. ESRD on peritoneal dialysis- nephrology to follow. 4. Diabetes mellitus-continue sliding scale insulin with NovoLog, NovoLog 70/30, 4 units subcutaneous daily 5. Hypertension- blood pressure stable, continue Coreg, Hydralazine. 6. Elevated troponin- patient has elevated CK 770, troponin  was 0.08 and coming down to 0.07. Likely from muscle spasm versus seizure. Patient denies any chest pain. We'll recheck CK in a.m.    DVT prophylaxis: SCDs  Code Status: Full code  Family Communication: Discussed with patient's wife at bedside   Disposition Plan: Pending evaluation of muscle spasm/seizure   Consultants:  Nephrology   Procedures:  None   Continuous infusions .  sodium chloride    . valproate sodium Stopped (11/23/16 1014)      Antibiotics:   Anti-infectives    None       Objective   Vitals:   11/23/16 0300 11/23/16 0400 11/23/16 0926 11/23/16 0942  BP: (!) 159/138 (!) 160/90 (!) 170/110 (!) 150/78  Pulse: 77 81  83  Resp: 17 18  18   Temp:  98.1 F (36.7 C)  98.1 F (36.7 C)  TempSrc:  Oral  Oral  SpO2: 100% 99%  95%  Weight:  86.2 kg (190 lb)    Height:  6\' 2"  (1.88 m)      Intake/Output Summary (Last 24 hours) at 11/23/16 1317 Last data filed at 11/23/16 1048  Gross per 24 hour  Intake              620 ml  Output                0 ml  Net              620 ml   Filed Weights   11/23/16 0400  Weight: 86.2 kg (190 lb)     Physical Examination:   Physical Exam: Eyes: No icterus, extraocular muscles intact  Mouth: Oral mucosa is moist, no lesions on palate,  Neck: Supple, no deformities, masses, or tenderness Lungs: Normal respiratory effort, bilateral clear to auscultation, no crackles or wheezes.  Heart: Regular rate and rhythm, S1 and S2 normal, no murmurs, rubs auscultated Abdomen: BS normoactive,soft,nondistended,non-tender to palpation,no organomegaly Extremities: No pretibial edema, no erythema, no cyanosis, no clubbing Neuro : Alert and oriented to time, place and person, No focal deficits       Skin: No  rashes seen on exam     Data Reviewed: I have personally reviewed following labs and imaging studies  CBG:  Recent Labs Lab 11/22/16 0213 11/23/16 0348 11/23/16 0838 11/23/16 1212  GLUCAP 464* 151* 83 139*    CBC:  Recent Labs Lab 11/22/16 0001 11/22/16 2039 11/22/16 2301 11/23/16 0359  WBC 7.3 7.8  --  7.8  NEUTROABS 4.1 5.3  --   --   HGB 11.4* 11.4* 11.9* 11.5*  HCT 32.5* 33.4* 35.0* 35.1*  MCV 84.0 84.1  --  86.2  PLT 172 183  --  536    Basic Metabolic Panel:  Recent Labs Lab 11/22/16 0001 11/22/16 2039 11/22/16 2301 11/22/16 2309 11/23/16 0359  NA 129* 134* 137  --   137  K 3.9 3.5 3.5  --  3.9  CL 92* 95* 95*  --  98*  CO2 26 25  --   --  27  GLUCOSE 490* 297* 288*  --  153*  BUN 49* 55* 55*  --  59*  CREATININE 16.20* 18.25* >18.00*  --  18.37*  CALCIUM 7.0* 7.0*  --   --  7.4*  MG  --   --   --  1.7 2.0  PHOS  --   --   --  5.6* 6.0*    No results found for this or any previous visit (from the past 240 hour(s)).   Liver Function Tests:  Recent Labs Lab 11/22/16 2039 11/23/16 0359  AST 26 25  ALT 15* 15*  ALKPHOS 119 121  BILITOT 0.6 0.5  PROT 6.2* 6.4*  ALBUMIN 2.5* 2.4*   No results for input(s): LIPASE, AMYLASE in the last 168 hours. No results for input(s): AMMONIA in the last 168 hours.  Cardiac Enzymes:  Recent Labs Lab 11/22/16 2039 11/23/16 0359 11/23/16 1004  CKTOTAL 770*  --   --   TROPONINI  --  0.08* 0.07*   BNP (last 3 results) No results for input(s): BNP in the last 8760 hours.  ProBNP (last 3 results) No results for input(s): PROBNP in the last 8760 hours.    Studies: Dg Cervical Spine Complete  Result Date: 11/22/2016 CLINICAL DATA:  Seizure activity with neck pain, initial encounter EXAM: CERVICAL SPINE - COMPLETE 4+ VIEW COMPARISON:  None. FINDINGS: Seven cervical segments are well visualized. Vertebral body height is well maintained. Disc space narrowing is noted at C5-6 with endplate sclerosis and mild retrolisthesis of C5 with respect to C6. No soft tissue abnormality is noted. The neural foramina are widely patent bilaterally. Dialysis catheter is noted on the right. The odontoid is within normal limits. IMPRESSION: Degenerative changes at C5-6.  No acute abnormality noted. Electronically Signed   By: Inez Catalina M.D.   On: 11/22/2016 21:18   Ct Head Wo Contrast  Result Date: 11/22/2016 CLINICAL DATA:  Right lower extremity tremors EXAM: CT HEAD WITHOUT CONTRAST TECHNIQUE: Contiguous axial images were obtained from the base of the skull through the vertex without intravenous contrast. COMPARISON:   None. FINDINGS: Brain: No mass lesion, intraparenchymal hemorrhage or extra-axial collection. No evidence of acute cortical infarct. Brain parenchyma and CSF-containing spaces are normal for age. Vascular: No hyperdense vessel or unexpected calcification. Skull: Normal visualized skull base, calvarium and extracranial soft tissues. Sinuses/Orbits: Near complete opacification of the right maxillary sinus by high density secretions. Moderate opacification of the right frontal and ethmoid sinuses. Normal orbits. IMPRESSION: 1. Normal brain.  No acute intracranial abnormality. 2. Right ostiomeatal complex pattern sinonasal  obstruction. Electronically Signed   By: Ulyses Jarred M.D.   On: 11/22/2016 03:01   Mr Brain Wo Contrast  Result Date: 11/23/2016 CLINICAL DATA:  51 year old male with seizure like activity. Right lower extremity twitching. Hyperglycemia at presentation. EXAM: MRI HEAD WITHOUT CONTRAST TECHNIQUE:   IMPRESSION: 1.  No acute intracranial abnormality identified. 2. 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 3. Evidence of left optic nerve atrophy. 4. Moderate to severe paranasal sinus mucosal thickening primarily on the right. Electronically Signed   By: Genevie Ann M.D.   On: 11/23/2016 09:12    Scheduled Meds: . calcium acetate  1,334 mg Oral TID WC  . carvedilol  25 mg Oral BID WC  . hydrALAZINE  100 mg Oral TID  . insulin aspart  0-9 Units Subcutaneous Q4H  . insulin aspart protamine- aspart  4 Units Subcutaneous Q breakfast  . sodium chloride flush  3 mL Intravenous Q12H  . sodium chloride flush  3 mL Intravenous Q12H      Time spent: 25 min  [facility] Pager [phone]. If 7PM-7AM, please contact night-coverage at [email or web address], Office  [phone]  password  TRH1 11/23/2016, 1:17 PM  LOS: 0 days

## 2016-11-23 NOTE — Progress Notes (Signed)
Pt arrived to 6E16.  Pt alert and oriented.  No clonic activity assessed by RN.  Magnesium bolus running.  NSR in 80s.  Pt has no c/o pain.    0400 Pt wife at bedside with pt.

## 2016-11-23 NOTE — Progress Notes (Signed)
Niotified MD Lama of pt's BP 170/110. See MAR.  Will continue to monitor pt. Paulla Fore, RN.

## 2016-11-23 NOTE — Consult Note (Signed)
NEURO HOSPITALIST CONSULT NOTE   Requestig physician: Dr. Darrick Meigs   Reason for Consult: leg cramps and body cramps   History obtained from:  Patient   HPI:                                                                                                                                          Bryan Wilkerson is an 51 y.o. male who has known diabetes along with chronic and disease and currently on peritoneal dialysis. Patient presented to [facility] secondary to having multiple episodes of right leg cramping, and contracting into a flexed position. Patient states after this he will notice that his whole body starts to cramp up at times he feels as though there is a generalized twitching. He has had periods when she has fallen down secondary to this. Patient states he has never lost consciousness and he is aware the whole time. He is aware enough that at times he will feel his legs start to cramp and quickly lay down on the floor and at times uses left hand to hold his head. This lasts for upwards to 8-10 minutes. He has tried to use his upper extremities to push his leg down when these events start but he is unable to keep the cramping from occurring. Apparently this has been going on for approximately a week and getting worse over time.  As stated there is no loss of consciousness, he is able to hear and speak throughout the event.  Prior to this consultation neurology was called overnight and at that time it was recommended that patient be placed on Depakote and have EEG and MRI done. MRI has been done and does not show any etiology for these events. Of note looking at his chemistry panel patient's phosphorus has been slowly and progressively become elevated with the last phosphorus being 6.01 day ago 5.6. Most recent calciums have been 7.4, 7.0, 7.0.  CK 1 day ago was also noted to be elevated at 770.   Past Medical History:  Diagnosis Date  . Anemia   . CHF  (congestive heart failure) ([place])   . Chronic kidney disease    MWF  . Diabetes mellitus without complication ([place])    type 1  . Hypertension   . Pneumonia   . Psoriasis   . Retinopathy     Past Surgical History:  Procedure Laterality Date  . AV FISTULA PLACEMENT Right 12/05/2013   Procedure: RADIOCEPHALIC VS. BRACHIOCEPHALIC ARTERIOVENOUS (AV) FISTULA CREATION;  Surgeon: Conrad , MD;  Location: [facility];  Service: Vascular;  Laterality: Right;  . AV FISTULA PLACEMENT Left 07/20/2016   Procedure: LEFT ARM RADIOCEPHALIC ARTERIOVENOUS (AV) FISTULA CREATION;  Surgeon: Waynetta Sandy, MD;  Location: [facility];  Service: Vascular;  Laterality: Left;  . CATARACT EXTRACTION W/ INTRAOCULAR LENS  IMPLANT, BILATERAL    . EYE SURGERY     retina reattachment  . FISTULOGRAM Right 07/16/2014   Procedure: FISTULOGRAM;  Surgeon: Conrad [place], MD;  Location: [facility];  Service: Vascular;  Laterality: Right;  . Hemodialysis Catheter Left   . LIGATION OF COMPETING BRANCHES OF ARTERIOVENOUS FISTULA Right 07/16/2014   Procedure: LIGATION OF COMPETING BRANCHES OF ARTERIOVENOUS FISTULA;  Surgeon: Conrad Concord, MD;  Location: [facility];  Service: Vascular;  Laterality: Right;    Family History  Problem Relation Age of Onset  . Hypertension Mother   . Heart attack Mother   . Chronic Renal Failure Neg Hx   . Diabetes Neg Hx   . Stroke Neg Hx   . Cancer Neg Hx       Social History:  reports that he has never smoked. He has never used smokeless tobacco. He reports that he does not drink alcohol or use drugs.  Allergies  Allergen Reactions  . Penicillins Other (See Comments)    UNSPECIFIED REACTION FROM CHILDHOOD Has patient had a PCN reaction causing immediate rash, facial/tongue/throat swelling, SOB or lightheadedness with hypotension:Yes Has patient had a PCN reaction causing severe rash involving mucus membranes or skin necrosis:No Has patient had a PCN reaction that required  hospitalization:Yes Has patient had a PCN reaction occurring within the last 10 years:No If all of the above answers are "NO", then may proceed with Cephalosporin use.      MEDICATIONS:                                                                                                                     Scheduled: . calcium acetate  1,334 mg Oral TID WC  . carvedilol  25 mg Oral BID WC  . hydrALAZINE  100 mg Oral TID  . insulin aspart  0-9 Units Subcutaneous Q4H  . insulin aspart protamine- aspart  4 Units Subcutaneous Q breakfast  . sodium chloride flush  3 mL Intravenous Q12H  . sodium chloride flush  3 mL Intravenous Q12H     ROS:                                                                                                                                       History obtained from the patient  General ROS: negative for - chills, fatigue, fever, night  sweats, weight gain or weight loss Psychological ROS: negative for - behavioral disorder, hallucinations, memory difficulties, mood swings or suicidal ideation Ophthalmic ROS: negative for - blurry vision, double vision, eye pain or loss of vision ENT ROS: negative for - epistaxis, nasal discharge, oral lesions, sore throat, tinnitus or vertigo Allergy and Immunology ROS: negative for - hives or itchy/watery eyes Hematological and Lymphatic ROS: negative for - bleeding problems, bruising or swollen lymph nodes Endocrine ROS: negative for - galactorrhea, hair pattern changes, polydipsia/polyuria or temperature intolerance Respiratory ROS: negative for - cough, hemoptysis, shortness of breath or wheezing Cardiovascular ROS: negative for - chest pain, dyspnea on exertion, edema or irregular heartbeat Gastrointestinal ROS: negative for - abdominal pain, diarrhea, hematemesis, nausea/vomiting or stool incontinence Genito-Urinary ROS: negative for - dysuria, hematuria, incontinence or urinary frequency/urgency Musculoskeletal ROS: negative  for - joint swelling or muscular weakness Neurological ROS: as noted in HPI Dermatological ROS: negative for rash and skin lesion changes   Blood pressure (!) 150/78, pulse 83, temperature 98.1 F (36.7 C), temperature source Oral, resp. rate 18, height 6\' 2"  (1.88 m), weight 86.2 kg (190 lb), SpO2 95 %.   Neurologic Examination:                                                                                                      HEENT-  Normocephalic, no lesions, without obvious abnormality.  Normal external eye and conjunctiva.  Normal TM's bilaterally.  Normal auditory canals and external ears. Normal external nose, mucus membranes and septum.  Normal pharynx. Cardiovascular- S1, S2 normal, pulses palpable throughout   Lungs- chest clear, no wheezing, rales, normal symmetric air entry Abdomen- normal findings: bowel sounds normal Extremities- positive findings: Multiple areas on his legs that look as though they've been burned and healed Lymph-no adenopathy palpable Musculoskeletal-no joint tenderness, deformity or swelling Skin-As above with extremities  Neurological Examination Mental Status: Alert, oriented, thought content appropriate.  Speech fluent without evidence of aphasia.  Able to follow 3 step commands without difficulty. Cranial Nerves: SL:HTDSKA fields grossly normal,  III,IV, VI: ptosis not present, extra-ocular motions intact bilaterally,pupils show 1 mm in difference however patient has recently undergone multiple eye surgeries, round, reactive to light and accommodation V,VII: smile symmetric, facial light touch sensation normal bilaterally VIII: hearing normal bilaterally IX,X: uvula rises symmetrically XI: bilateral shoulder shrug XII: midline tongue extension Motor: Right : Upper extremity   5/5    Left:     Upper extremity   5/5  Lower extremity   5/5     Lower extremity   5/5 Tone and bulk:normal tone throughout; no atrophy noted Sensory: Pinprick and light  touch intact throughout, bilaterally Deep Tendon Reflexes: Depressed throughout Plantars: Right: downgoing   Left: downgoing Cerebellar: normal finger-to-nose,  and normal heel-to-shin test Gait: Not tested      Lab Results: Basic Metabolic Panel:  Recent Labs Lab 11/22/16 0001 11/22/16 2039 11/22/16 2301 11/22/16 2309 11/23/16 0359  NA 129* 134* 137  --  137  K 3.9 3.5 3.5  --  3.9  CL 92* 95*  95*  --  98*  CO2 26 25  --   --  27  GLUCOSE 490* 297* 288*  --  153*  BUN 49* 55* 55*  --  59*  CREATININE 16.20* 18.25* >18.00*  --  18.37*  CALCIUM 7.0* 7.0*  --   --  7.4*  MG  --   --   --  1.7 2.0  PHOS  --   --   --  5.6* 6.0*    Liver Function Tests:  Recent Labs Lab 11/22/16 2039 11/23/16 0359  AST 26 25  ALT 15* 15*  ALKPHOS 119 121  BILITOT 0.6 0.5  PROT 6.2* 6.4*  ALBUMIN 2.5* 2.4*   No results for input(s): LIPASE, AMYLASE in the last 168 hours. No results for input(s): AMMONIA in the last 168 hours.  CBC:  Recent Labs Lab 11/22/16 0001 11/22/16 2039 11/22/16 2301 11/23/16 0359  WBC 7.3 7.8  --  7.8  NEUTROABS 4.1 5.3  --   --   HGB 11.4* 11.4* 11.9* 11.5*  HCT 32.5* 33.4* 35.0* 35.1*  MCV 84.0 84.1  --  86.2  PLT 172 183  --  196    Cardiac Enzymes:  Recent Labs Lab 11/22/16 2039 11/23/16 0359  CKTOTAL 770*  --   TROPONINI  --  0.08*    Lipid Panel: No results for input(s): CHOL, TRIG, HDL, CHOLHDL, VLDL, LDLCALC in the last 168 hours.  CBG:  Recent Labs Lab 11/22/16 0213 11/23/16 0348 11/23/16 0838  GLUCAP 464* 151* 56    Microbiology: No results found for this or any previous visit.  Coagulation Studies: No results for input(s): LABPROT, INR in the last 72 hours.  Imaging: Dg Cervical Spine Complete  Result Date: 11/22/2016 CLINICAL DATA:  Seizure activity with neck pain, initial encounter EXAM: CERVICAL SPINE - COMPLETE 4+ VIEW COMPARISON:  None. FINDINGS: Seven cervical segments are well visualized. Vertebral  body height is well maintained. Disc space narrowing is noted at C5-6 with endplate sclerosis and mild retrolisthesis of C5 with respect to C6. No soft tissue abnormality is noted. The neural foramina are widely patent bilaterally. Dialysis catheter is noted on the right. The odontoid is within normal limits. IMPRESSION: Degenerative changes at C5-6.  No acute abnormality noted. Electronically Signed   By: Inez Catalina M.D.   On: 11/22/2016 21:18   Ct Head Wo Contrast  Result Date: 11/22/2016 CLINICAL DATA:  Right lower extremity tremors EXAM: CT HEAD WITHOUT CONTRAST TECHNIQUE: Contiguous axial images were obtained from the base of the skull through the vertex without intravenous contrast. COMPARISON:  None. FINDINGS: Brain: No mass lesion, intraparenchymal hemorrhage or extra-axial collection. No evidence of acute cortical infarct. Brain parenchyma and CSF-containing spaces are normal for age. Vascular: No hyperdense vessel or unexpected calcification. Skull: Normal visualized skull base, calvarium and extracranial soft tissues. Sinuses/Orbits: Near complete opacification of the right maxillary sinus by high density secretions. Moderate opacification of the right frontal and ethmoid sinuses. Normal orbits. IMPRESSION: 1. Normal brain.  No acute intracranial abnormality. 2. Right ostiomeatal complex pattern sinonasal obstruction. Electronically Signed   By: Ulyses Jarred M.D.   On: 11/22/2016 03:01   Mr Brain Wo Contrast  Result Date: 11/23/2016 CLINICAL DATA:  51 year old male with seizure like activity. Right lower extremity twitching. Hyperglycemia at presentation. EXAM: MRI HEAD WITHOUT CONTRAST TECHNIQUE: Multiplanar, multiecho pulse sequences of the brain and surrounding structures were obtained without intravenous contrast. COMPARISON:  Head CT without contrast 11/22/2016. FINDINGS: Brain: No restricted  diffusion or evidence of acute infarction. No cerebral edema identified. Evidence of a chronic  lacunar infarct in the central right thalamus. Mild patchy and scattered mostly periventricular and central cerebral white matter T2 and FLAIR hyperintensity which is mildly or moderately advanced for age. Possible small chronic micro hemorrhages in the left periventricular white matter and right frontal operculum (series 9, images 14 and 15). Hippocampal formations and mesial temporal lobe structures appear symmetric and within normal limits. No midline shift, mass effect, evidence of mass lesion, ventriculomegaly, extra-axial collection or acute intracranial hemorrhage. Cervicomedullary junction and pituitary are within normal limits. Vascular: Major intracranial vascular flow voids are preserved. Skull and upper cervical spine: Heterogeneous decreased T1 bone marrow signal at the skullbase and in the visible cervical spine. Calvarium bone marrow signal is normal. No destructive osseous lesion identified. Sinuses/Orbits: Asymmetric T2 hyperintensity throughout the left optic nerve from anterior to the chiasm (series 11, images 22 through 27) suggesting atrophy. Postoperative changes to both globes. Other orbits soft tissues appear normal. Moderate to severe paranasal sinus mucosal thickening primarily on the right side and sparing the sphenoid sinuses. Other: Visible internal auditory structures appear normal. Mastoids are clear. Negative scalp soft tissues. IMPRESSION: 1.  No acute intracranial abnormality identified. 2. 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 3. Evidence of left optic nerve atrophy. 4. Moderate to severe paranasal sinus mucosal thickening primarily on the right. Electronically Signed   By: Genevie Ann M.D.   On: 11/23/2016 09:12       Assessment and plan per attending neurologist  Etta Quill  PA-C [facility] [phone]  11/23/2016, 10:09 AM  I have seen the patient and reviewed the above note.  Assessment/Plan: 51 year old male with recurrent stereotype spells always being in the right leg and then spreading to involve bilateral arms. The bilateral nature with preserved consciousness would be very unusual for seizure. Also the fact that they typically begin while walking would argue against seizures well. Arguing for seizure, would be the stereotyped starting location of the right leg with subsequent marching up to include the arms. He has not had any further spells since starting Depakote, and I think it would be reasonable to give a trial of this medication to see if it is helpful.  The stereotyped location with spread be unusual for like to light abnormalities, and his hypocalcemia is not such that I would expect tetany. If the spells continue, then continuous EEG could be obtained.  His CK is elevated, but in an African-American of his size with renal failure, I would consider this a relatively mild elevation with unclear significance. I agree with repeating it, and a matter what the cause of his muscle contractions, discogenic contribute to the elevation.  1) would strive to correct left leg abnormalities 2) continue Depakote 500 mg 3 times a day 3) neurology will continue to follow  Roland Rack, MD [facility] [phone]  If 7pm- 7am, please page neurology on call as listed in [place].

## 2016-11-23 NOTE — Progress Notes (Signed)
EEG completed, results pending. 

## 2016-11-23 NOTE — Progress Notes (Signed)
CRITICAL VALUE ALERT  Critical value received: Troponin 0.08   Date of notification: 11/23/16  Time of notification:  0450  Critical value read back:Yes.    Nurse who received alert:  Bo Merino RN  MD notified (1st page):  K.Schorr   Time of first page:  [phone]

## 2016-11-23 NOTE — Procedures (Signed)
ELECTROENCEPHALOGRAM REPORT  Date of Study: 11/23/2016  Patient's Name: Bryan Wilkerson MRN: [id number] Date of Birth: Aug 19, 1965  Referring Provider: Toy Baker, MD  Clinical History: 51 year old man with CKD on peritonal dialysis presents with multiple episodes of right leg cramping, and contracting into a flexed position.  Medications: acetaminophen (TYLENOL)   carvedilol (COREG) tablet 25 mg  hydrALAZINE (APRESOLINE) tablet 100 mg  insulin aspart (novoLOG) injection 0-9 Units  insulin aspart protamine- aspart LORazepam (ATIVAN) injection 1-2 mg  ondansetron (ZOFRAN) valproate (DEPACON) 500 mg IVPB  Technical Summary: A multichannel digital EEG recording measured by the international 10-20 system with electrodes applied with paste and impedances below 5000 ohms performed in our laboratory with EKG monitoring in an awake and drowsy patient.  Hyperventilation was not performed.  Photic stimulation was performed.  The digital EEG was referentially recorded, reformatted, and digitally filtered in a variety of bipolar and referential montages for optimal display.    Description: The patient is awake and drowsy during the recording.  During maximal wakefulness, there is a symmetric, medium voltage 8 Hz posterior dominant rhythm that attenuates with eye opening.  The record is symmetric.  During drowsiness, there is an increase in theta slowing of the background.  Stage 2 sleep was not seen.  Photic stimulation did not elicit any abnormalities.  There were no epileptiform discharges or electrographic seizures seen.    EKG lead was unremarkable.  Impression: This awake and drowsy EEG is normal.    Clinical Correlation: A normal EEG does not exclude a clinical diagnosis of epilepsy.  If further clinical questions remain, prolonged EEG may be helpful.  Clinical correlation is advised.   Metta Clines, DO

## 2016-11-23 NOTE — ED Notes (Addendum)
Report given to [facility] at [facility], [place] called.

## 2016-11-23 NOTE — Care Management Obs Status (Signed)
[facility] NOTIFICATION   Patient Details  Name: Bryan Wilkerson MRN: [id number] Date of Birth: 1965-07-07   Medicare Observation Status Notification Given:  Yes    Takoda Janowiak, Rory Percy, RN 11/23/2016, 2:24 PM

## 2016-11-24 LAB — GLUCOSE, CAPILLARY
GLUCOSE-CAPILLARY: 97 mg/dL (ref 65–99)
GLUCOSE-CAPILLARY: 136 mg/dL — AB (ref 65–99)
Glucose-Capillary: 168 mg/dL — ABNORMAL HIGH (ref 65–99)
Glucose-Capillary: 324 mg/dL — ABNORMAL HIGH (ref 65–99)
Glucose-Capillary: 72 mg/dL (ref 65–99)
Glucose-Capillary: 93 mg/dL (ref 65–99)

## 2016-11-24 LAB — COMPREHENSIVE METABOLIC PANEL
ALBUMIN: 2.1 g/dL — AB (ref 3.5–5.0)
ALT: 13 U/L — ABNORMAL LOW (ref 17–63)
AST: 23 U/L (ref 15–41)
Alkaline Phosphatase: 108 U/L (ref 38–126)
Anion gap: 15 (ref 5–15)
BUN: 64 mg/dL — AB (ref 6–20)
CHLORIDE: 96 mmol/L — AB (ref 101–111)
CO2: 25 mmol/L (ref 22–32)
Calcium: 7.2 mg/dL — ABNORMAL LOW (ref 8.9–10.3)
Creatinine, Ser: 18.86 mg/dL — ABNORMAL HIGH (ref 0.61–1.24)
GFR calc Af Amer: 3 mL/min — ABNORMAL LOW (ref >60)
GFR calc non Af Amer: 2 mL/min — ABNORMAL LOW (ref >60)
GLUCOSE: 91 mg/dL (ref 65–99)
POTASSIUM: 3.4 mmol/L — AB (ref 3.5–5.1)
Sodium: 136 mmol/L (ref 135–145)
Total Bilirubin: 0.5 mg/dL (ref 0.3–1.2)
Total Protein: 6 g/dL — ABNORMAL LOW (ref 6.5–8.1)

## 2016-11-24 LAB — CBC
HCT: 33.8 % — ABNORMAL LOW (ref 39.0–52.0)
Hemoglobin: 11.2 g/dL — ABNORMAL LOW (ref 13.0–17.0)
MCH: 28.4 pg (ref 26.0–34.0)
MCHC: 33.1 g/dL (ref 30.0–36.0)
MCV: 85.6 fL (ref 78.0–100.0)
Platelets: 176 K/uL (ref 150–400)
RBC: 3.95 MIL/uL — ABNORMAL LOW (ref 4.22–5.81)
RDW: 16 % — ABNORMAL HIGH (ref 11.5–15.5)
WBC: 6.9 K/uL (ref 4.0–10.5)

## 2016-11-24 LAB — HEMOGLOBIN A1C
HEMOGLOBIN A1C: 11.9 % — AB (ref 4.8–5.6)
Mean Plasma Glucose: 295 mg/dL

## 2016-11-24 LAB — CK: Total CK: 662 U/L — ABNORMAL HIGH (ref 49–397)

## 2016-11-24 LAB — PARATHYROID HORMONE, INTACT (NO CA): PTH: 286 pg/mL — ABNORMAL HIGH (ref 15–65)

## 2016-11-24 MED ORDER — POTASSIUM CHLORIDE CRYS ER 20 MEQ PO TBCR
30.0000 meq | EXTENDED_RELEASE_TABLET | Freq: Once | ORAL | Status: AC
  Administered 2016-11-24: 30 meq via ORAL
  Filled 2016-11-24: qty 1

## 2016-11-24 MED ORDER — DELFLEX-LC/1.5% DEXTROSE 344 MOSM/L IP SOLN: INTRAPERITONEAL | Status: DC

## 2016-11-24 MED ORDER — HYDRALAZINE HCL 50 MG PO TABS: 50.0000 mg | ORAL_TABLET | Freq: Two times a day (BID) | ORAL | Status: DC

## 2016-11-24 MED ORDER — CARVEDILOL 12.5 MG PO TABS
12.5000 mg | ORAL_TABLET | Freq: Two times a day (BID) | ORAL | Status: DC
  Administered 2016-11-24 – 2016-11-25 (×2): 12.5 mg via ORAL
  Filled 2016-11-24 (×2): qty 1

## 2016-11-24 MED ORDER — HEPARIN 1000 UNIT/ML FOR PERITONEAL DIALYSIS
INTRAPERITONEAL | Status: DC | PRN
  Filled 2016-11-24: qty 3000

## 2016-11-24 NOTE — Progress Notes (Addendum)
[facility] Progress Note  1.   Subjective:  No issues with PD overnight. No c/os this am. Hasn't tried to get out of bed yet.  Feels nauseous and lightheaded (BP low)  Objective Vitals:   11/23/16 2128 11/24/16 0422 11/24/16 0659 11/24/16 0839  BP: (!) 104/54 (!) 104/59 126/68 118/69  Pulse: 69 72 67 68  Resp: 17 16 18 16   Temp: 98.2 F (36.8 C) 97.5 F (36.4 C) 97.8 F (36.6 C) 98.6 F (37 C)  TempSrc: Oral Oral Oral Oral  SpO2: 96% 95% 95% 100%  Weight: 86.1 kg (189 lb 13.1 oz)  85.6 kg (188 lb 11.4 oz)   Height:       Physical Exam General: WNWD AA male NAD  Heart: RRR Lungs: CTAB Abdomen: soft NT/ND Extremities: no LE edema no twitching or jerks Dialysis Access: PD cath in place/ RIJ TDC/ L AVF maturing +bruit    Additional Objective Labs: Basic Metabolic Panel:  Recent Labs Lab 11/22/16 2039 11/22/16 2301 11/22/16 2309 11/23/16 0359 11/24/16 0606  NA 134* 137  --  137 136  K 3.5 3.5  --  3.9 3.4*  CL 95* 95*  --  98* 96*  CO2 25  --   --  27 25  GLUCOSE 297* 288*  --  153* 91  BUN 55* 55*  --  59* 64*  CREATININE 18.25* >18.00*  --  18.37* 18.86*  CALCIUM 7.0*  --   --  7.4* 7.2*  PHOS  --   --  5.6* 6.0*  --      Recent Labs Lab 11/22/16 2039 11/23/16 0359 11/24/16 0606  AST 26 25 23   ALT 15* 15* 13*  ALKPHOS 119 121 108  BILITOT 0.6 0.5 0.5  PROT 6.2* 6.4* 6.0*  ALBUMIN 2.5* 2.4* 2.1*    Recent Labs Lab 11/22/16 0001 11/22/16 2039 11/22/16 2301 11/23/16 0359 11/24/16 0606  WBC 7.3 7.8  --  7.8 6.9  NEUTROABS 4.1 5.3  --   --   --   HGB 11.4* 11.4* 11.9* 11.5* 11.2*  HCT 32.5* 33.4* 35.0* 35.1* 33.8*  MCV 84.0 84.1  --  86.2 85.6  PLT 172 183  --  196 176    Recent Labs Lab 11/22/16 2039 11/23/16 0359 11/23/16 1004 11/23/16 1524 11/24/16 0606  CKTOTAL 770*  --   --   --  662*  TROPONINI  --  0.08* 0.07* 0.06*  --     Recent Labs Lab 11/23/16 1659 11/23/16 2125 11/24/16 0006 11/24/16 0417  11/24/16 0745  GLUCAP 180* 286* 324* 168* 72   Medications: . sodium chloride    . dialysis solution 2.5% low-MG/low-CA    . dialysis solution 2.5% low-MG/low-CA    . valproate sodium Stopped (11/24/16 0842)   . calcium acetate  2,001 mg Oral TID WC  . carvedilol  25 mg Oral BID WC  . gentamicin cream  1 application Topical Daily  . hydrALAZINE  100 mg Oral TID  . insulin aspart  0-9 Units Subcutaneous Q4H  . insulin aspart protamine- aspart  4 Units Subcutaneous Q breakfast  . sodium chloride flush  3 mL Intravenous Q12H  . sodium chloride flush  3 mL Intravenous Q12H  . triamcinolone cream   Topical TID   Background: The patient is a 51 y.o. year-old AAM. PMH DM, HTN, secondary HPT, anemia. Previously on HD, recent transition to peritoneal dialysis ([facility]). Gives history starting a couple of weeks ago of severe drawing/cramping  of RLE with subsequent feeling of "flimsy legs", hard to walk d/t weak. Says walking can precipitate. Can feel it coming on. Never loses consciousness. No perioral paresthesias or issues with UE's. Came to ED with these sx. Was initially placed on Depakote and EEG/MRI ordered. Neuro is followingOf note elevated CPK 770 on 5/21.  Assessment/Plan: 2. Leg cramping/"drawing" without LOC - etiology unclear .  Mild hypocalcemia but not enough to cause tetany (and no perioral numbness, Cvostek's or Trousseau's sign). (Has elevated CPK ? etiology and ? If consequence of severe sustained muscle contraction). Neuro following Normal EEG 5/22  3. ESRD - recent transition to [facility]. Despite creatinine of 18, most recent clearance studies from his [facility] showed KT/V 2.1 which should reflect adequate clearance. Continue current [facility]CPD. 4. Hypocalcemia/hyperphosphatemia - Corr Ca 8.7/ P 6  On Ca acetate- 3 q ac  5. Hypokalemia - mild. KDur 30 once.  6. Elevated CPK - ? If related to what sounds like sustained muscle contraction episodes (similar to the elevated CKw e see  after t-c sz). 005>110 on repeat 7. HTN -  carvedilol/hydralazine started during admit (was taken off BP meds at [facility] a couple weeks ago by Dr. Justin Mend d/t low BP's). Have used all 2.5% dialysate for elev BP since adm. BP now low/normal - Not sure will need regimen quite this aggressive. I have backed down on hydralazine to 50 BID from 100 TID and carvedilol down to 12.5 with hold parameters and changed to 1.5% dialysate for tonight.  8. DM - insulin per primary service  Dialysis Orders: 5 exchanges /24 hours. 2.5 liters. Dwell 1 hour 30 minutes, Last fill 2.5 liters, drain in 4 hours. No daytime dwell.  Lynnda Child PA-C [facility] Pager [phone] 11/24/2016,9:46 AM  LOS: 1 day   I have seen and examined this patient and agree with plan and assessment in the above note with renal recommendations/intervention highlighted. Have backed off on BP meds d/t hypotension (was taken off all BP meds past 2 weeks d/t low BP at home). Repleting K. Await further neuro recs. (has not ambulated yet and he says that has provoked prior episodes of the leg cramping/drawing)  Cedrick Partain B,MD 11/24/2016 1:32 PM

## 2016-11-24 NOTE — Progress Notes (Signed)
Subjective: He has had no further episodes however he has not been up with PT.   Exam: Vitals:   11/24/16 0839 11/24/16 0900  BP: 118/69 (!) 93/51  Pulse: 68 78  Resp: 16 18  Temp: 98.6 F (37 C) 98.1 F (36.7 C)    HEENT-  Normocephalic, no lesions, without obvious abnormality.  Normal external eye and conjunctiva.  Normal TM's bilaterally.  Normal auditory canals and external ears. Normal external nose, mucus membranes and septum.  Normal pharynx.   Neurological Examination Mental Status: Alert, oriented, thought content appropriate. Resting comfortably in bed.  Speech fluent without evidence of aphasia.  Able to follow 3 step commands without difficulty. Cranial Nerves: DU:KGURKY fields grossly normal,  III,IV, VI: ptosis not present, extra-ocular motions intact bilaterally,pupils show 1 mm in difference however patient has recently undergone multiple eye surgeries, round, reactive to light and accommodation V,VII: smile symmetric, facial light touch sensation normal bilaterally VIII: hearing normal bilaterally IX,X: uvula rises symmetrically XI: bilateral shoulder shrug XII: midline tongue extension Motor: Right :  Upper extremity   5/5                                      Left:     Upper extremity   5/5             Lower extremity   5/5                                                  Lower extremity   5/5 Tone and bulk:normal tone throughout; no atrophy noted Sensory: Pinprick and light touch intact throughout, bilaterally Deep Tendon Reflexes: Depressed throughout Plantars: Right: downgoing                                Left: downgoing Cerebellar: normal finger-to-nose,  and normal heel-to-shin test Gait: Not tested    Pertinent Labs/Diagnostics: CK 662 down from 770 EEG--A normal EEG does not exclude a clinical diagnosis of epilepsy.  BUN 18.86 Cr 18.86 CK 662 Troponin 0.06 Calcium 7.2  Etta Quill PA-C [facility] [phone]  Impression:   51 year old male with stereotyped episodes of unclear etiology. Without further episodes since starting depakote and with the description of stereotyped spell that starts in single extremity and then spreads in Jacksonian fashion, I do think that seizures are a possibility though the semiology is extremely unusual. If he has further episodes, then may need to consider continuous EEG or ambulatory EEG to further characterize the spells. I would not aggressively uptitrate antiepileptic medications without further characterization.  Recommendations: 1) could change Depacon to oral Depakote 750 twice a day 2) no further recommendations unless the patient continues to have spells. Please call if any further issues arise.   Roland Rack, MD [facility] [phone]  If 7pm- 7am, please page neurology on call as listed in [facility]. 11/24/2016, 10:28 AM

## 2016-11-24 NOTE — Progress Notes (Signed)
[facility]  PROGRESS NOTE  Bryan Wilkerson CBJ:[id number] DOB: [birth year] DOA: 11/22/2016 PCP: Patient, No Pcp Per   Brief HPI:   51 year old male with history of ESRD on peritoneal dialysis, status post left radiocephalic AV fistula placement on 07/20/2016, diabetes mellitus, hypertension, anemia who initially came to [facility] for right leg cramping and diffuse shaking of the body. Patient denies loss of consciousness. Patient was discharged from the ED and then came back to [facility] with similar complaints. Neurology was consulted by the ED physician and patient was started on Depakote and EEG MRI was ordered. EEG and MRI was not significant.     Subjective   No more episodes in the last 24 hours.    Assessment/Plan:     Seizure-like activity versus muscle spasm- we'll consult neurology as patient has been started on Depakote, EEG and MRI ordered, not significant for any acute pathology.  depakote changed to oral today. Currently denies any complaints today, but at the same time he hasn't ambulated. So PT consulted.    Hypocalcemia-not significant enough to cause muscle spasms. Monitor calcium level.   Hypokalemia: repleted.   ESRD on peritoneal dialysis- further recommendations as per nephrology.  Diabetes mellitus- CBG (last 3)  11/24/16 0417 11/24/16 0745 11/24/16 1207  GLUCAP 168* 72 97   Recommend to continue with SSI.  No further changes.   Hypertension- hypotensive episodes, holding hydralazine . Continue with coreg.   Elevated troponin- probably from ESRD, doesn't appear to be demand ischemia.  No chest pain or sob.      DVT prophylaxis: SCDs  Code Status: Full code  Family Communication: Discussed with patient's wife at bedside   Disposition Plan: Pending PT evaluation.    Consultants:  Nephrology   Neurology  Physical therapy.   Procedures:  None   Continuous infusions . sodium chloride    . dialysis solution 2.5%  low-MG/low-CA    . dialysis solution 2.5% low-MG/low-CA    . valproate sodium 500 mg (11/24/16 1542)      Antibiotics:   Anti-infectives    None       Objective   Vitals:   11/24/16 0422 11/24/16 0659 11/24/16 0839 11/24/16 0900  BP: (!) 104/59 126/68 118/69 (!) 93/51  Pulse: 72 67 68 78  Resp: 16 18 16 18   Temp: 97.5 F (36.4 C) 97.8 F (36.6 C) 98.6 F (37 C) 98.1 F (36.7 C)  TempSrc: Oral Oral Oral Oral  SpO2: 95% 95% 100% 95%  Weight:  85.6 kg (188 lb 11.4 oz)    Height:        Intake/Output Summary (Last 24 hours) at 11/24/16 1706 Last data filed at 11/24/16 0840  Gross per 24 hour  Intake            10356 ml  Output            11874 ml  Net            -1518 ml   Filed Weights   11/23/16 1827 11/23/16 2128 11/24/16 0659  Weight: 86.1 kg (189 lb 13.1 oz) 86.1 kg (189 lb 13.1 oz) 85.6 kg (188 lb 11.4 oz)     Physical Examination:   Physical Exam:  Pt alert and comfortable.  Lungs: Normal respiratory effort, bilateral clear to auscultation, no crackles or wheezes.  Heart: Regular rate and rhythm, S1 and S2 normal, no murmurs, rubs auscultated Abdomen: BS normoactive,soft,nondistended,non-tender to palpation,no organomegaly Extremities: No pretibial edema, no erythema, no cyanosis, no  clubbing Neuro : Alert and oriented to time, place and person, No focal deficits       Skin: No rashes seen on exam     Data Reviewed: I have personally reviewed following labs and imaging studies  CBG:  Recent Labs Lab 11/23/16 2125 11/24/16 0006 11/24/16 0417 11/24/16 0745 11/24/16 1207  GLUCAP 286* 324* 168* 72 97    CBC:  Recent Labs Lab 11/22/16 0001 11/22/16 2039 11/22/16 2301 11/23/16 0359 11/24/16 0606  WBC 7.3 7.8  --  7.8 6.9  NEUTROABS 4.1 5.3  --   --   --   HGB 11.4* 11.4* 11.9* 11.5* 11.2*  HCT 32.5* 33.4* 35.0* 35.1* 33.8*  MCV 84.0 84.1  --  86.2 85.6  PLT 172 183  --  196 478    Basic Metabolic Panel:  Recent Labs Lab  11/22/16 0001 11/22/16 2039 11/22/16 2301 11/22/16 2309 11/23/16 0359 11/24/16 0606  NA 129* 134* 137  --  137 136  K 3.9 3.5 3.5  --  3.9 3.4*  CL 92* 95* 95*  --  98* 96*  CO2 26 25  --   --  27 25  GLUCOSE 490* 297* 288*  --  153* 91  BUN 49* 55* 55*  --  59* 64*  CREATININE 16.20* 18.25* >18.00*  --  18.37* 18.86*  CALCIUM 7.0* 7.0*  --   --  7.4* 7.2*  MG  --   --   --  1.7 2.0  --   PHOS  --   --   --  5.6* 6.0*  --     No results found for this or any previous visit (from the past 240 hour(s)).   Liver Function Tests:  Recent Labs Lab 11/22/16 2039 11/23/16 0359 11/24/16 0606  AST 26 25 23   ALT 15* 15* 13*  ALKPHOS 119 121 108  BILITOT 0.6 0.5 0.5  PROT 6.2* 6.4* 6.0*  ALBUMIN 2.5* 2.4* 2.1*   No results for input(s): LIPASE, AMYLASE in the last 168 hours. No results for input(s): AMMONIA in the last 168 hours.  Cardiac Enzymes:  Recent Labs Lab 11/22/16 2039 11/23/16 0359 11/23/16 1004 11/23/16 1524 11/24/16 0606  CKTOTAL 770*  --   --   --  662*  TROPONINI  --  0.08* 0.07* 0.06*  --    BNP (last 3 results) No results for input(s): BNP in the last 8760 hours.  ProBNP (last 3 results) No results for input(s): PROBNP in the last 8760 hours.    Studies: Dg Cervical Spine Complete  Result Date: 11/22/2016 CLINICAL DATA:  Seizure activity with neck pain, initial encounter EXAM: CERVICAL SPINE - COMPLETE 4+ VIEW COMPARISON:  None. FINDINGS: Seven cervical segments are well visualized. Vertebral body height is well maintained. Disc space narrowing is noted at C5-6 with endplate sclerosis and mild retrolisthesis of C5 with respect to C6. No soft tissue abnormality is noted. The neural foramina are widely patent bilaterally. Dialysis catheter is noted on the right. The odontoid is within normal limits. IMPRESSION: Degenerative changes at C5-6.  No acute abnormality noted. Electronically Signed   By: Inez Catalina M.D.   On: 11/22/2016 21:18   Ct Head Wo  Contrast  Result Date: 11/22/2016 CLINICAL DATA:  Right lower extremity tremors EXAM: CT HEAD WITHOUT CONTRAST TECHNIQUE: Contiguous axial images were obtained from the base of the skull through the vertex without intravenous contrast. COMPARISON:  None. FINDINGS: Brain: No mass lesion, intraparenchymal hemorrhage or extra-axial collection.  No evidence of acute cortical infarct. Brain parenchyma and CSF-containing spaces are normal for age. Vascular: No hyperdense vessel or unexpected calcification. Skull: Normal visualized skull base, calvarium and extracranial soft tissues. Sinuses/Orbits: Near complete opacification of the right maxillary sinus by high density secretions. Moderate opacification of the right frontal and ethmoid sinuses. Normal orbits. IMPRESSION: 1. Normal brain.  No acute intracranial abnormality. 2. Right ostiomeatal complex pattern sinonasal obstruction. Electronically Signed   By: Ulyses Jarred M.D.   On: 11/22/2016 03:01   Mr Brain Wo Contrast  Result Date: 11/23/2016 CLINICAL DATA:  51 year old male with seizure like activity. Right lower extremity twitching. Hyperglycemia at presentation. EXAM: MRI HEAD WITHOUT CONTRAST TECHNIQUE:   IMPRESSION: 1.  No acute intracranial abnormality identified. 2. 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 3. Evidence of left optic nerve atrophy. 4. Moderate to severe paranasal sinus mucosal thickening primarily on the right. Electronically Signed   By: Genevie Ann M.D.   On: 11/23/2016 09:12    Scheduled Meds: . calcium acetate  2,001 mg Oral TID WC  . carvedilol  12.5 mg Oral BID WC  . gentamicin cream  1 application Topical Daily  . hydrALAZINE  50 mg Oral BID  . insulin aspart  0-9 Units Subcutaneous Q4H  . insulin aspart protamine- aspart  4 Units  Subcutaneous Q breakfast  . sodium chloride flush  3 mL Intravenous Q12H  . sodium chloride flush  3 mL Intravenous Q12H  . triamcinolone cream   Topical TID      Time spent: 25 min  Caydn Justen   [facility] Pager [phone] If 7PM-7AM, please contact night-coverage at [email or web address], Office  [phone]  password [email or web address] 11/24/2016, 5:06 PM  LOS: 1 day

## 2016-11-24 NOTE — Progress Notes (Signed)
Inpatient Diabetes Program Recommendations  AACE/ADA: New Consensus Statement on Inpatient Glycemic Control (2015)  Target Ranges:  Prepandial:   less than 140 mg/dL      Peak postprandial:   less than 180 mg/dL (1-2 hours)      Critically ill patients:  140 - 180 mg/dL   Lab Results  Component Value Date   GLUCAP 97 11/24/2016   HGBA1C 11.9 (H) 11/23/2016    Review of Glycemic Control  Pt nauseated and vomiting at present.   Inpatient Recommendations: D/C 70/30 and begin Levemir 4 units QHS Add Novolog 2 units tidwc Continue Novolog 0-9 units tidwc and hs  Will talk to pt in am regarding his HgbA1C results.  Thank you. Lorenda Peck, RD, LDN, CDE Inpatient Diabetes Coordinator [phone]

## 2016-11-25 LAB — RENAL FUNCTION PANEL
ANION GAP: 16 — AB (ref 5–15)
Albumin: 2 g/dL — ABNORMAL LOW (ref 3.5–5.0)
BUN: 65 mg/dL — ABNORMAL HIGH (ref 6–20)
CHLORIDE: 97 mmol/L — AB (ref 101–111)
CO2: 20 mmol/L — ABNORMAL LOW (ref 22–32)
Calcium: 7.1 mg/dL — ABNORMAL LOW (ref 8.9–10.3)
Creatinine, Ser: 19.31 mg/dL — ABNORMAL HIGH (ref 0.61–1.24)
GFR calc Af Amer: 3 mL/min — ABNORMAL LOW (ref >60)
GFR, EST NON AFRICAN AMERICAN: 2 mL/min — AB (ref >60)
GLUCOSE: 198 mg/dL — AB (ref 65–99)
PHOSPHORUS: 6.8 mg/dL — AB (ref 2.5–4.6)
POTASSIUM: 3.1 mmol/L — AB (ref 3.5–5.1)
Sodium: 133 mmol/L — ABNORMAL LOW (ref 135–145)

## 2016-11-25 LAB — GLUCOSE, CAPILLARY
GLUCOSE-CAPILLARY: 189 mg/dL — AB (ref 65–99)
GLUCOSE-CAPILLARY: 177 mg/dL — AB (ref 65–99)
GLUCOSE-CAPILLARY: 228 mg/dL — AB (ref 65–99)
Glucose-Capillary: 168 mg/dL — ABNORMAL HIGH (ref 65–99)
Glucose-Capillary: 190 mg/dL — ABNORMAL HIGH (ref 65–99)
Glucose-Capillary: 194 mg/dL — ABNORMAL HIGH (ref 65–99)

## 2016-11-25 MED ORDER — CARVEDILOL 6.25 MG PO TABS
6.2500 mg | ORAL_TABLET | Freq: Two times a day (BID) | ORAL | Status: DC
  Administered 2016-11-25 – 2016-11-27 (×2): 6.25 mg via ORAL
  Filled 2016-11-25 (×2): qty 1

## 2016-11-25 MED ORDER — DIVALPROEX SODIUM 500 MG PO DR TAB
500.0000 mg | DELAYED_RELEASE_TABLET | Freq: Three times a day (TID) | ORAL | Status: DC
  Administered 2016-11-25 – 2016-11-27 (×6): 500 mg via ORAL
  Filled 2016-11-25 (×6): qty 1

## 2016-11-25 MED ORDER — POTASSIUM CHLORIDE CRYS ER 20 MEQ PO TBCR
40.0000 meq | EXTENDED_RELEASE_TABLET | Freq: Once | ORAL | Status: AC
  Administered 2016-11-25: 40 meq via ORAL
  Filled 2016-11-25: qty 2

## 2016-11-25 NOTE — Evaluation (Signed)
Physical Therapy Evaluation Patient Details Name: Bryan Wilkerson MRN: [id number] DOB: [birth year] Today's Date: 11/25/2016   History of Present Illness  Pt is a 51 yo male admitted through ED with c/o leg cramping and tremors which are still being diagnosed. Pt recieved an EEG which was normal. Pt reports that his symptoms come on as tingeling in his RLE which progresses to a "twisting" feeling /cramping in in RLE and progresses to a full on tremor which lasts for a few minutes. Pt reports that his RLE is numb following these episodes.Pt was started on Depakote and is being treated for low electrolytes. PMH significant for ESRD on peritoneal HD, L radiocephalic AC fistula placed 07/20/26, DM, HTN, anemia.    Clinical Impression  Pt presents with the above diagnosis and below deficits for therapy evaluation. Prior to admission, pt was completely independent and working full time. Pt has had multiple episodes as noted above which have left him fearful of doing any mobility. Pt is able to perform bed mobs, gait and transfers with supervision to min guard this session. Pt is able to increased gait distance to 100' before he begins to feel his symptoms increase. Returned to room and once pt was laying on bed, symptoms subsided. Pt will benefit from continued acute PT services in order to address the below deficits prior to discharge home.     Follow Up Recommendations No PT follow up    Equipment Recommendations  None recommended by PT    Recommendations for Other Services       Precautions / Restrictions Precautions Precautions: Fall Precaution Comments: ? siezure Restrictions Weight Bearing Restrictions: No      Mobility  Bed Mobility Overal bed mobility: Modified Independent             General bed mobility comments: able to get EOB without any assistance  Transfers Overall transfer level: Needs assistance Equipment used: None Transfers: Sit to/from Stand Sit to Stand:  Supervision         General transfer comment: supervision from EOB due to multiple days of not getting OOB. Some c/o dizziness  Ambulation/Gait Ambulation/Gait assistance: Supervision;Min guard Ambulation Distance (Feet): 150 Feet (40x2, 110x1) Assistive device: None Gait Pattern/deviations: WFL(Within Functional Limits);Decreased step length - right;Decreased step length - left Gait velocity: decreased Gait velocity interpretation: Below normal speed for age/gender General Gait Details: slower cadence and step length bilaterally. Pt has some symptoms of tingeling in RLE following gait x 100' and feels his "episode" is coming on. Pt returns to room and lays down in bed where symtpoms resolve.   Stairs            Wheelchair Mobility    Modified Rankin (Stroke Patients Only)       Balance Overall balance assessment: No apparent balance deficits (not formally assessed)                                           Pertinent Vitals/Pain Pain Assessment: No/denies pain    Home Living Family/patient expects to be discharged to:: Private residence Living Arrangements: Spouse/significant other Available Help at Discharge: Family;Available 24 hours/day Type of Home: House Home Access: Stairs to enter Entrance Stairs-Rails: None Entrance Stairs-Number of Steps: 2 Home Layout: 1/2 bath on main level;Bed/bath upstairs;Two level Home Equipment: None      Prior Function Level of Independence: Independent  Comments: completely independent prior to admission including driving. Works full time and does all yard work at home.      Hand Dominance   Dominant Hand: Left    Extremity/Trunk Assessment   Upper Extremity Assessment Upper Extremity Assessment: Overall WFL for tasks assessed    Lower Extremity Assessment Lower Extremity Assessment: Overall WFL for tasks assessed    Cervical / Trunk Assessment Cervical / Trunk Assessment: Normal   Communication   Communication: No difficulties  Cognition Arousal/Alertness: Awake/alert Behavior During Therapy: WFL for tasks assessed/performed Overall Cognitive Status: Within Functional Limits for tasks assessed                                        General Comments      Exercises     Assessment/Plan    PT Assessment Patient needs continued PT services  PT Problem List Decreased strength;Decreased activity tolerance;Decreased balance;Decreased mobility       PT Treatment Interventions DME instruction;Gait training;Stair training;Functional mobility training;Therapeutic activities;Therapeutic exercise;Balance training    PT Goals (Current goals can be found in the Care Plan section)  Acute Rehab PT Goals Patient Stated Goal: to get back to work PT Goal Formulation: With patient Time For Goal Achievement: 12/02/16 Potential to Achieve Goals: Good    Frequency Min 3X/week   Barriers to discharge        Co-evaluation               AM-PAC PT "6 Clicks" Daily Activity  Outcome Measure Difficulty turning over in bed (including adjusting bedclothes, sheets and blankets)?: None Difficulty moving from lying on back to sitting on the side of the bed? : None Difficulty sitting down on and standing up from a chair with arms (e.g., wheelchair, bedside commode, etc,.)?: A Little Help needed moving to and from a bed to chair (including a wheelchair)?: A Little Help needed walking in hospital room?: A Little Help needed climbing 3-5 steps with a railing? : A Little 6 Click Score: 20    End of Session Equipment Utilized During Treatment: Gait belt Activity Tolerance: Patient tolerated treatment well Patient left: in chair;with call bell/phone within reach;with chair alarm set Nurse Communication: Mobility status PT Visit Diagnosis: Unsteadiness on feet (R26.81)    Time: 1610-9604 PT Time Calculation (min) (ACUTE ONLY): 38 min   Charges:   PT  Evaluation $PT Eval Moderate Complexity: 1 Procedure PT Treatments $Gait Training: 8-22 mins $Therapeutic Activity: 8-22 mins   PT G Codes:        Scheryl Marten PT, DPT  [phone]   Jacqulyn Liner Sloan Leiter 11/25/2016, 4:13 PM

## 2016-11-25 NOTE — Progress Notes (Signed)
[facility]  PROGRESS NOTE  Bryan Wilkerson HGD:[id number] DOB: 03-Dec-1965 DOA: 11/22/2016 PCP: Patient, No Pcp Per   Brief HPI:   51 year old male with history of ESRD on peritoneal dialysis, status post left radiocephalic AV fistula placement on 07/20/2016, diabetes mellitus, hypertension, anemia who initially came to [facility] for right leg cramping and diffuse shaking of the body. Patient denies loss of consciousness. Patient was discharged from the ED and then came back to [facility] with similar complaints. Neurology was consulted by the ED physician and patient was started on Depakote and EEG MRI was ordered. EEG and MRI was not significant.     Subjective   No more episodes in the last 24 hours.    Assessment/Plan:     Seizure-like activity versus muscle spasm-  patient has been started on Depakote, EEG and MRI ordered, not significant for any acute pathology.  depakote changed to oral today. Neurology consulted and recommendations given.  Currently denies any complaints today, but at the same time he hasn't ambulated. So PT consulted. Awaiting PT evaluation.   Hypocalcemia-not significant enough to cause muscle spasms. Monitor calcium level.   Hypokalemia: repleted. Repeat level still low. k dur 40 meq ordered.   ESRD on peritoneal dialysis- further recommendations as per nephrology.  Diabetes mellitus- CBG (last 3)  11/24/16 0417 11/24/16 0745 11/24/16 1207  GLUCAP 168* 72 97   Recommend to continue with SSI.  No further changes.   Hypertension- hypotensive episodes, holding hydralazine . Decreased the dose of coreg from 12.5 to 6.25 mg BID.   Elevated troponin- probably from ESRD, doesn't appear to be demand ischemia.  No chest pain or sob.      DVT prophylaxis: SCDs  Code Status: Full code  Family Communication: Discussed with patient's wife at bedside   Disposition Plan: Pending PT evaluation.    Consultants:  Nephrology    Neurology  Physical therapy.   Procedures:  None   Continuous infusions . sodium chloride    . dialysis solution 1.5% low-MG/low-CA        Antibiotics:   Anti-infectives    None       Objective   Vitals:   11/24/16 2022 11/25/16 0500 11/25/16 0538 11/25/16 0938  BP: (!) 104/57  117/63 108/60  Pulse: 69  67 60  Resp: 18  18 16   Temp: 97.8 F (36.6 C)  98.5 F (36.9 C) 97.7 F (36.5 C)  TempSrc: Oral  Oral Oral  SpO2: 100%  97% 98%  Weight: 88.1 kg (194 lb 3.6 oz) 88.1 kg (194 lb 3.6 oz)    Height:        Intake/Output Summary (Last 24 hours) at 11/25/16 1543 Last data filed at 11/25/16 1300  Gross per 24 hour  Intake            13546 ml  Output            11914 ml  Net             1632 ml   Filed Weights   11/24/16 0659 11/24/16 2022 11/25/16 0500  Weight: 85.6 kg (188 lb 11.4 oz) 88.1 kg (194 lb 3.6 oz) 88.1 kg (194 lb 3.6 oz)     Physical Examination:   Physical Exam:  Pt alert and comfortable.  Lungs: Normal respiratory effort, bilateral clear to auscultation, no crackles or wheezes.  Heart: Regular rate and rhythm, S1 and S2 normal, no murmurs, rubs auscultated Abdomen: BS normoactive,soft,nondistended,non-tender to palpation,no organomegaly Extremities:  No pretibial edema, no erythema, no cyanosis, no clubbing Neuro : Alert and oriented to time, place and person, No focal deficits       Skin: No rashes seen on exam     Data Reviewed: I have personally reviewed following labs and imaging studies  CBG:  Recent Labs Lab 11/24/16 2019 11/25/16 0011 11/25/16 0410 11/25/16 0742 11/25/16 1145  GLUCAP 136* 168* 190* 189* 194*    CBC:  Recent Labs Lab 11/22/16 0001 11/22/16 2039 11/22/16 2301 11/23/16 0359 11/24/16 0606  WBC 7.3 7.8  --  7.8 6.9  NEUTROABS 4.1 5.3  --   --   --   HGB 11.4* 11.4* 11.9* 11.5* 11.2*  HCT 32.5* 33.4* 35.0* 35.1* 33.8*  MCV 84.0 84.1  --  86.2 85.6  PLT 172 183  --  196 176    Basic  Metabolic Panel:  Recent Labs Lab 11/22/16 0001 11/22/16 2039 11/22/16 2301 11/22/16 2309 11/23/16 0359 11/24/16 0606 11/25/16 0455  NA 129* 134* 137  --  137 136 133*  K 3.9 3.5 3.5  --  3.9 3.4* 3.1*  CL 92* 95* 95*  --  98* 96* 97*  CO2 26 25  --   --  27 25 20*  GLUCOSE 490* 297* 288*  --  153* 91 198*  BUN 49* 55* 55*  --  59* 64* 65*  CREATININE 16.20* 18.25* >18.00*  --  18.37* 18.86* 19.31*  CALCIUM 7.0* 7.0*  --   --  7.4* 7.2* 7.1*  MG  --   --   --  1.7 2.0  --   --   PHOS  --   --   --  5.6* 6.0*  --  6.8*    No results found for this or any previous visit (from the past 240 hour(s)).   Liver Function Tests:  Recent Labs Lab 11/22/16 2039 11/23/16 0359 11/24/16 0606 11/25/16 0455  AST 26 25 23   --   ALT 15* 15* 13*  --   ALKPHOS 119 121 108  --   BILITOT 0.6 0.5 0.5  --   PROT 6.2* 6.4* 6.0*  --   ALBUMIN 2.5* 2.4* 2.1* 2.0*   No results for input(s): LIPASE, AMYLASE in the last 168 hours. No results for input(s): AMMONIA in the last 168 hours.  Cardiac Enzymes:  Recent Labs Lab 11/22/16 2039 11/23/16 0359 11/23/16 1004 11/23/16 1524 11/24/16 0606  CKTOTAL 770*  --   --   --  662*  TROPONINI  --  0.08* 0.07* 0.06*  --    BNP (last 3 results) No results for input(s): BNP in the last 8760 hours.  ProBNP (last 3 results) No results for input(s): PROBNP in the last 8760 hours.    Studies: Dg Cervical Spine Complete  Result Date: 11/22/2016 CLINICAL DATA:  Seizure activity with neck pain, initial encounter EXAM: CERVICAL SPINE - COMPLETE 4+ VIEW COMPARISON:  None. FINDINGS: Seven cervical segments are well visualized. Vertebral body height is well maintained. Disc space narrowing is noted at C5-6 with endplate sclerosis and mild retrolisthesis of C5 with respect to C6. No soft tissue abnormality is noted. The neural foramina are widely patent bilaterally. Dialysis catheter is noted on the right. The odontoid is within normal limits. IMPRESSION:  Degenerative changes at C5-6.  No acute abnormality noted. Electronically Signed   By: Inez Catalina M.D.   On: 11/22/2016 21:18   Ct Head Wo Contrast  Result Date: 11/22/2016 CLINICAL DATA:  Right lower extremity tremors EXAM: CT HEAD WITHOUT CONTRAST TECHNIQUE: Contiguous axial images were obtained from the base of the skull through the vertex without intravenous contrast. COMPARISON:  None. FINDINGS: Brain: No mass lesion, intraparenchymal hemorrhage or extra-axial collection. No evidence of acute cortical infarct. Brain parenchyma and CSF-containing spaces are normal for age. Vascular: No hyperdense vessel or unexpected calcification. Skull: Normal visualized skull base, calvarium and extracranial soft tissues. Sinuses/Orbits: Near complete opacification of the right maxillary sinus by high density secretions. Moderate opacification of the right frontal and ethmoid sinuses. Normal orbits. IMPRESSION: 1. Normal brain.  No acute intracranial abnormality. 2. Right ostiomeatal complex pattern sinonasal obstruction. Electronically Signed   By: Ulyses Jarred M.D.   On: 11/22/2016 03:01   Mr Brain Wo Contrast  Result Date: 11/23/2016 CLINICAL DATA:  51 year old male with seizure like activity. Right lower extremity twitching. Hyperglycemia at presentation. EXAM: MRI HEAD WITHOUT CONTRAST TECHNIQUE:   IMPRESSION: 1.  No acute intracranial abnormality identified. 2. 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 3. Evidence of left optic nerve atrophy. 4. Moderate to severe paranasal sinus mucosal thickening primarily on the right. Electronically Signed   By: Genevie Ann M.D.   On: 11/23/2016 09:12    Scheduled Meds: . calcium acetate  2,001 mg Oral TID WC  . carvedilol  6.25 mg Oral BID WC  . divalproex  500 mg Oral Q8H  .  gentamicin cream  1 application Topical Daily  . insulin aspart  0-9 Units Subcutaneous Q4H  . insulin aspart protamine- aspart  4 Units Subcutaneous Q breakfast  . sodium chloride flush  3 mL Intravenous Q12H  . sodium chloride flush  3 mL Intravenous Q12H  . triamcinolone cream   Topical TID      Time spent: 25 min  Keyanah Kozicki   [facility] Pager [phone] If 7PM-7AM, please contact night-coverage at [email or web address], Office  [phone]  password [email or web address] 11/25/2016, 3:43 PM  LOS: 2 days

## 2016-11-25 NOTE — Progress Notes (Signed)
[facility] Progress Note    Subjective:  No issues with PD overnight. Has not been up with PT yet. No new c/os  Objective Vitals:   11/24/16 2022 11/25/16 0500 11/25/16 0538 11/25/16 0938  BP: (!) 104/57  117/63 108/60  Pulse: 69  67 60  Resp: 18  18 16   Temp: 97.8 F (36.6 C)  98.5 F (36.9 C) 97.7 F (36.5 C)  TempSrc: Oral  Oral Oral  SpO2: 100%  97% 98%  Weight: 88.1 kg (194 lb 3.6 oz) 88.1 kg (194 lb 3.6 oz)    Height:       Physical Exam General: WNWD AA male NAD  Heart: RRR Lungs: CTAB Abdomen: soft NT/ND Extremities: no LE edema no twitching or jerks Dialysis Access: PD cath in place/ RIJ TDC/ L AVF maturing +bruit    Additional Objective Labs: Basic Metabolic Panel:  Recent Labs Lab 11/22/16 2309 11/23/16 0359 11/24/16 0606 11/25/16 0455  NA  --  137 136 133*  K  --  3.9 3.4* 3.1*  CL  --  98* 96* 97*  CO2  --  27 25 20*  GLUCOSE  --  153* 91 198*  BUN  --  59* 64* 65*  CREATININE  --  18.37* 18.86* 19.31*  CALCIUM  --  7.4* 7.2* 7.1*  PHOS 5.6* 6.0*  --  6.8*     Recent Labs Lab 11/22/16 2039 11/23/16 0359 11/24/16 0606 11/25/16 0455  AST 26 25 23   --   ALT 15* 15* 13*  --   ALKPHOS 119 121 108  --   BILITOT 0.6 0.5 0.5  --   PROT 6.2* 6.4* 6.0*  --   ALBUMIN 2.5* 2.4* 2.1* 2.0*    Recent Labs Lab 11/22/16 0001 11/22/16 2039 11/22/16 2301 11/23/16 0359 11/24/16 0606  WBC 7.3 7.8  --  7.8 6.9  NEUTROABS 4.1 5.3  --   --   --   HGB 11.4* 11.4* 11.9* 11.5* 11.2*  HCT 32.5* 33.4* 35.0* 35.1* 33.8*  MCV 84.0 84.1  --  86.2 85.6  PLT 172 183  --  196 176    Recent Labs Lab 11/22/16 2039 11/23/16 0359 11/23/16 1004 11/23/16 1524 11/24/16 0606  CKTOTAL 770*  --   --   --  662*  TROPONINI  --  0.08* 0.07* 0.06*  --     Recent Labs Lab 11/24/16 1728 11/24/16 2019 11/25/16 0011 11/25/16 0410 11/25/16 0742  GLUCAP 93 136* 168* 190* 189*   Medications: . sodium chloride    . dialysis solution 1.5%  low-MG/low-CA    . valproate sodium 500 mg (11/25/16 0500)   . calcium acetate  2,001 mg Oral TID WC  . carvedilol  12.5 mg Oral BID WC  . gentamicin cream  1 application Topical Daily  . insulin aspart  0-9 Units Subcutaneous Q4H  . insulin aspart protamine- aspart  4 Units Subcutaneous Q breakfast  . sodium chloride flush  3 mL Intravenous Q12H  . sodium chloride flush  3 mL Intravenous Q12H  . triamcinolone cream   Topical TID   Background: The patient is a 51 y.o. year-old AAM. PMH DM, HTN, secondary HPT, anemia. Previously on HD, recent transition to peritoneal dialysis ([facility]). Gives history starting a couple of weeks ago of severe drawing/cramping of RLE with subsequent feeling of "flimsy legs", hard to walk d/t weak. Says walking can precipitate. Can feel it coming on. Never loses consciousness. No perioral paresthesias or issues  with UE's. Came to ED with these sx. Was initially placed on Depakote and EEG/MRI ordered. Neuro is followingOf note elevated CPK 770 on 5/21.  Assessment/Plan: 1. Leg cramping/"drawing" without LOC - etiology unclear .  Mild hypocalcemia but not enough to cause tetany (and no perioral numbness, Cvostek's or Trousseau's sign). (Has elevated CPK ? etiology and ? If consequence of severe sustained muscle contraction). Neuro following MRI neg Normal EEG 5/22. Oral Depakote per neuro though not clearly seizures. Needs to ambulate (activity provoked PTA)  2. ESRD - recent transition to [facility]CPD. Despite creatinine of 18, most recent clearance studies from his [facility] showed KT/V 2.1 which should reflect adequate clearance. Continue current CCPD. 3. Hypocalcemia/hyperphosphatemia - Corr Ca 8.7/ P 6  On Ca acetate- 3 q ac  4. Hypokalemia -40 meq KCl dosed today  5. Elevated CPK - ? If related to what sounds like sustained muscle contraction episodes (similar to the elevated CKw e see after t-c sz). 338>250 on repeat 6. HTN -  carvedilol/hydralazine started  during admit (was taken off BP meds at Home [facility] a couple weeks ago by Dr. Justin Mend d/t low BP's).  Hydralazine now dc'd and carvedilol down to 12.5 Cont 1.5% dialysate for tonight with low BPs 7. DM - insulin per primary service  Dialysis Orders: 5 exchanges /24 hours. 2.5 liters. Dwell 1 hour 30 minutes, Last fill 2.5 liters, drain in 4 hours. No daytime dwell.   Lynnda Child PA-C [facility] Pager [phone] 11/25/2016,11:04 AM   I have seen and examined this patient and agree with plan and assessment in the above note with renal recommendations/intervention highlighted. Depakote/ambulate.replete K, decrease BP meds, all 1.5% dialysate.  Avilyn Virtue B,MD 11/25/2016 12:20 PM

## 2016-11-25 NOTE — Progress Notes (Signed)
PT Cancellation Note  Patient Details Name: Bryan Wilkerson MRN: [id number] DOB: 01/29/1966   Cancelled Treatment:    Reason Eval/Treat Not Completed: Patient at procedure or test/unavailable. Pt having a sterile procedure. Will check back later as time allows.    Scheryl Marten PT, DPT  [phone]  11/25/2016, 2:47 PM

## 2016-11-26 LAB — RENAL FUNCTION PANEL
Albumin: 2.3 g/dL — ABNORMAL LOW (ref 3.5–5.0)
Anion gap: 14 (ref 5–15)
BUN: 66 mg/dL — ABNORMAL HIGH (ref 6–20)
CO2: 24 mmol/L (ref 22–32)
Calcium: 7 mg/dL — ABNORMAL LOW (ref 8.9–10.3)
Chloride: 94 mmol/L — ABNORMAL LOW (ref 101–111)
Creatinine, Ser: 18.66 mg/dL — ABNORMAL HIGH (ref 0.61–1.24)
GFR calc Af Amer: 3 mL/min — ABNORMAL LOW (ref >60)
GFR calc non Af Amer: 3 mL/min — ABNORMAL LOW (ref >60)
Glucose, Bld: 186 mg/dL — ABNORMAL HIGH (ref 65–99)
Phosphorus: 6.6 mg/dL — ABNORMAL HIGH (ref 2.5–4.6)
Potassium: 4.1 mmol/L (ref 3.5–5.1)
Sodium: 132 mmol/L — ABNORMAL LOW (ref 135–145)

## 2016-11-26 LAB — GLUCOSE, CAPILLARY
GLUCOSE-CAPILLARY: 129 mg/dL — AB (ref 65–99)
GLUCOSE-CAPILLARY: 206 mg/dL — AB (ref 65–99)
Glucose-Capillary: 170 mg/dL — ABNORMAL HIGH (ref 65–99)
Glucose-Capillary: 146 mg/dL — ABNORMAL HIGH (ref 65–99)
Glucose-Capillary: 172 mg/dL — ABNORMAL HIGH (ref 65–99)
Glucose-Capillary: 118 mg/dL — ABNORMAL HIGH (ref 65–99)

## 2016-11-26 MED ORDER — CALCITRIOL 0.25 MCG PO CAPS
0.2500 ug | ORAL_CAPSULE | Freq: Every day | ORAL | Status: DC
  Administered 2016-11-26 – 2016-11-27 (×2): 0.25 ug via ORAL
  Filled 2016-11-26 (×2): qty 1

## 2016-11-26 MED ORDER — CALCIUM ACETATE (PHOS BINDER) 667 MG PO CAPS
2668.0000 mg | ORAL_CAPSULE | Freq: Three times a day (TID) | ORAL | Status: DC
  Administered 2016-11-26 – 2016-11-27 (×3): 2668 mg via ORAL
  Filled 2016-11-26 (×3): qty 4

## 2016-11-26 MED ORDER — PRO-STAT SUGAR FREE PO LIQD
30.0000 mL | Freq: Two times a day (BID) | ORAL | Status: DC
  Administered 2016-11-26: 30 mL via ORAL
  Filled 2016-11-26 (×2): qty 30

## 2016-11-26 MED ORDER — RENA-VITE PO TABS
1.0000 | ORAL_TABLET | Freq: Every day | ORAL | Status: DC
  Administered 2016-11-26: 1 via ORAL
  Filled 2016-11-26: qty 1

## 2016-11-26 NOTE — Progress Notes (Signed)
Physical Therapy Treatment Patient Details Name: Bryan Wilkerson MRN: [id number] DOB: 1966-05-01 Today's Date: 11/26/2016    History of Present Illness Pt is a 51 yo male admitted through ED with c/o leg cramping and tremors which are still being diagnosed. Pt recieved an EEG which was normal. Pt reports that his symptoms come on as tingeling in his RLE which progresses to a "twisting" feeling /cramping in in RLE and progresses to a full on tremor which lasts for a few minutes. Pt reports that his RLE is numb following these episodes. PMH significant for ESRD on peritoneal HD, L radiocephalic AC fistula placed 07/20/26, DM, HTN, anemia.     PT Comments    Pt demonstrates improved tolerance for gait this session. Pt is able to increased gait distance and has some increased tingling in his RLE and the "feel" of his leg wanting to spasm, but it doesn't occur this session. Per wife, pt is walking at a slower pace than normal. Pt will benefit from additional therapy visit to perform stair negotiation and gait with increased speed.     Follow Up Recommendations  No PT follow up     Equipment Recommendations  None recommended by PT    Recommendations for Other Services       Precautions / Restrictions Precautions Precautions: Fall Precaution Comments: ? siezure Restrictions Weight Bearing Restrictions: No    Mobility  Bed Mobility Overal bed mobility: Independent             General bed mobility comments: able to get EOB without any assistance  Transfers Overall transfer level: Needs assistance Equipment used: None Transfers: Sit to/from Stand Sit to Stand: Supervision         General transfer comment: supervision for safety  Ambulation/Gait Ambulation/Gait assistance: Supervision;Min guard Ambulation Distance (Feet): 500 Feet Assistive device: None Gait Pattern/deviations: WFL(Within Functional Limits);Decreased step length - right;Decreased step length - left Gait  velocity: decreased Gait velocity interpretation: Below normal speed for age/gender General Gait Details: slower cadence and step length bilaterally. Pt has some symptoms of tingeling in RLE following gait x 100' and feels his "episode" is coming on. Pt returns to room and lays down in bed where symtpoms resolve.    Stairs            Wheelchair Mobility    Modified Rankin (Stroke Patients Only)       Balance Overall balance assessment: No apparent balance deficits (not formally assessed)                                          Cognition Arousal/Alertness: Awake/alert Behavior During Therapy: WFL for tasks assessed/performed Overall Cognitive Status: Within Functional Limits for tasks assessed                                        Exercises      General Comments        Pertinent Vitals/Pain Pain Assessment: No/denies pain    Home Living                      Prior Function            PT Goals (current goals can now be found in the care plan section) Acute Rehab PT Goals Patient Stated  Goal: to get back to work Progress towards PT goals: Progressing toward goals    Frequency    Min 3X/week      PT Plan Current plan remains appropriate    Co-evaluation              AM-PAC PT "6 Clicks" Daily Activity  Outcome Measure  Difficulty turning over in bed (including adjusting bedclothes, sheets and blankets)?: None Difficulty moving from lying on back to sitting on the side of the bed? : None Difficulty sitting down on and standing up from a chair with arms (e.g., wheelchair, bedside commode, etc,.)?: None Help needed moving to and from a bed to chair (including a wheelchair)?: None Help needed walking in hospital room?: A Little Help needed climbing 3-5 steps with a railing? : A Little 6 Click Score: 22    End of Session Equipment Utilized During Treatment: Gait belt Activity Tolerance: Patient  tolerated treatment well Patient left: in bed;with bed alarm set;with family/visitor present Nurse Communication: Mobility status PT Visit Diagnosis: Unsteadiness on feet (R26.81)     Time: 6967-8938 PT Time Calculation (min) (ACUTE ONLY): 21 min  Charges:  $Gait Training: 8-22 mins                    G Codes:       Scheryl Marten PT, DPT  [phone]    Jacqulyn Liner Sloan Leiter 11/26/2016, 4:20 PM

## 2016-11-26 NOTE — Progress Notes (Signed)
[facility] Progress Note   Subjective:    Per PT note ambulated 100' before he had onset of "symptoms" that resolved one he returned to room and laid down in bed. - PD going well - still connected to machine  Objective Vitals:   11/25/16 1952 11/25/16 2250 11/26/16 0425 11/26/16 0429  BP: 133/73  127/71   Pulse: 63  62   Resp: 18  18   Temp: 98.3 F (36.8 C)  98.1 F (36.7 C)   TempSrc: Oral  Oral   SpO2:   100%   Weight:  88.4 kg (194 lb 14.2 oz)  88.4 kg (194 lb 14.2 oz)  Height:       Physical Exam General:robust healthy appearing AAM NAD sitting up in bed; smells somewhat uremic to me Heart RRR: Lungs: no rales Abdomen: soft PD cath in tact Extremities: no LE edema Dialysis Access:  Left lower AVF + bruit - maturing, right IJ cath and PD cath    Recent Labs Lab 11/23/16 0359 11/24/16 0606 11/25/16 0455 11/26/16 1104  NA 137 136 133* 132*  K 3.9 3.4* 3.1* 4.1  CL 98* 96* 97* 94*  CO2 27 25 20* 24  GLUCOSE 153* 91 198* 186*  BUN 59* 64* 65* 66*  CREATININE 18.37* 18.86* 19.31* 18.66*  CALCIUM 7.4* 7.2* 7.1* 7.0*  PHOS 6.0*  --  6.8* 6.6*   Liver Function Tests:  Recent Labs Lab 11/22/16 2039 11/23/16 0359 11/24/16 0606 11/25/16 0455  AST 26 25 23   --   ALT 15* 15* 13*  --   ALKPHOS 119 121 108  --   BILITOT 0.6 0.5 0.5  --   PROT 6.2* 6.4* 6.0*  --   ALBUMIN 2.5* 2.4* 2.1* 2.0*   CBC:  Recent Labs Lab 11/22/16 0001 11/22/16 2039 11/22/16 2301 11/23/16 0359 11/24/16 0606  WBC 7.3 7.8  --  7.8 6.9  NEUTROABS 4.1 5.3  --   --   --   HGB 11.4* 11.4* 11.9* 11.5* 11.2*  HCT 32.5* 33.4* 35.0* 35.1* 33.8*  MCV 84.0 84.1  --  86.2 85.6  PLT 172 183  --  196 176  Cardiac Enzymes:  Recent Labs Lab 11/22/16 2039 11/23/16 0359 11/23/16 1004 11/23/16 1524 11/24/16 0606  CKTOTAL 770*  --   --   --  662*  TROPONINI  --  0.08* 0.07* 0.06*  --       Recent Labs Lab 11/25/16 1714 11/25/16 1956 11/26/16 0002 11/26/16 0424  11/26/16 0743  GLUCAP 177* 228* 206* 170* 146*    Medications: . sodium chloride    . dialysis solution 1.5% low-MG/low-CA     . calcium acetate  2,001 mg Oral TID WC  . carvedilol  6.25 mg Oral BID WC  . divalproex  500 mg Oral Q8H  . gentamicin cream  1 application Topical Daily  . insulin aspart  0-9 Units Subcutaneous Q4H  . insulin aspart protamine- aspart  4 Units Subcutaneous Q breakfast  . sodium chloride flush  3 mL Intravenous Q12H  . sodium chloride flush  3 mL Intravenous Q12H  . triamcinolone cream   Topical TID    Dialysis Orders:  5 exchanges /24 hours.  2.5 liters.  Dwell 1 hour 30 minutes,  Last fill 2.5 liters, drain in 4 hours.  No daytime dwell.   Background: The patient is a 51 y.o.year-old AAM. PMH DM, HTN, secondary HPT, anemia. Previously on HD, recent transition to peritoneal dialysis ([facility]).  Gives history starting a couple of weeks ago of severe drawing/cramping of RLE with subsequent feeling of "flimsy legs", hard to walk d/t weak. Says walking can precipitate. Can feel it coming on. Never loses consciousness. No perioral paresthesias or issues with UE's. Came to ED with these sx. Was initially placed on Depakote and EEG/MRI ordered. Neuro is followingOf note elevated CPK 770 on 5/21.  Assessment/Plan: 1. Leg cramping/"drawing" without LOC- etiology unclear .  Mild hypocalcemia but not enough to cause tetany (and no perioral numbness, Cvostek's or Trousseau's sign). (Has elevated CPK ? etiology and ? If consequence of severe sustained muscle contraction). Neuro following MRI neg Normal EEG 5/22. Oral Depakote per neuro though not clearly seizures. Needs to ambulate (activity provoked PTA)  2. ESRD- recent transition to [facility]. Despite creatinine of 19, most recent clearance studies from his [facility] showed KT/V 2.1 which should reflect adequate clearance. Continue current CCPD.  Tells me his catheter has not been removed because his clearances  are marginal 3. MBD -hypocalcemia/hyperphosphatemia- Corr Ca 8.7/ P 6  On Ca acetate- 3 q ac; repeat today; last iPTH was 653 - prev on calcitriol prior to change to PD - resume calcitriol 0.25 daily--need to be sure this is done at d/c. Increase phoslo to 4 ac 4. Hypokalemia - 40 meq KCl given 5/24 - K today 4.1 5. Elevated CPK- ? If related to what sounds like sustained muscle contraction episodes (similar to the elevated CK we see after t-c sz). 707>867 on repeat 6. HTN-  carvedilol/hydralazine started during admit (was taken off BP meds at [facility] a couple weeks ago by Dr. Justin Mend d/t low BP's).  Hydralazine now dc'd and carvedilol down to 12.5 bid.  Cont 1.5% dialysate for tonight with low BPs- wonder if he can tolerate a larger volume to help with clearance. Will  7. DM- insulin per primary service 8. Anemia - hgb 11.2 - not on ESA 9. Nutrition - alb low; add prostat  Myriam Jacobson, PA-C [place] [phone] 11/26/2016,8:47 AM  LOS: 3 days   I have seen and examined this patient and agree with plan and assessment in the above note with renal recommendations/intervention highlighted. Will try an increase in fill volume to 2.75 liters. Pt says not sure he is getting appropriate last fill (which may explain his rising creatinine). Increase ca acetate to 4 ac for low ca high phos  Carolanne Mercier B,MD 11/26/2016 1:02 PM

## 2016-11-26 NOTE — Plan of Care (Signed)
Problem: Nutrition: Goal: Adequate nutrition will be maintained Outcome: Not Progressing Patient ordered prostat supplement. Education provided. Patient agreed to try. Nausea and vomiting induced from supplement. Will continue to monitor. Bartholomew Crews, RN

## 2016-11-26 NOTE — Progress Notes (Signed)
[facility]  PROGRESS NOTE  Sincere Liuzzi CWC:[id number] DOB: [birth year] DOA: 11/22/2016 PCP: Patient, No Pcp Per   Brief HPI:   51 year old male with history of ESRD on peritoneal dialysis, status post left radiocephalic AV fistula placement on 07/20/2016, diabetes mellitus, hypertension, anemia who initially came to [facility] for right leg cramping and diffuse shaking of the body. Patient denies loss of consciousness. Patient was discharged from the ED and then came back to [facility] with similar complaints. Neurology was consulted by the ED physician and patient was started on Depakote and EEG MRI was ordered. EEG and MRI was not significant.     Subjective   No more episodes in the last 24 hours.    Assessment/Plan:     Seizure-like activity versus muscle spasm-  patient has been started on Depakote, EEG and MRI ordered, not significant for any acute pathology.  depakote changed to oral today. Neurology consulted and recommendations given.  Currently denies any complaints today, but at the same time he hasn't ambulated. So PT consulted. Awaiting PT evaluation. So far no more episodes of seizure like activity.   Hypocalcemia-not significant enough to cause muscle spasms. Monitor calcium level.   Hypokalemia: repleted. Repeat level good.   ESRD on peritoneal dialysis- further recommendations as per nephrology.  Diabetes mellitus- CBG (last 3)  11/24/16 0417 11/24/16 0745 11/24/16 1207  GLUCAP 168* 72 97   Recommend to continue with SSI.  No further changes.   Hypertension- hypotensive episodes, holding hydralazine . Decreased the dose of coreg from 12.5 to 6.25 mg BID.   Elevated troponin- probably from ESRD, doesn't appear to be demand ischemia.  No chest pain or sob.    Mild Hyponatremia;  From esrd. Asymptomatic.   DVT prophylaxis: SCDs  Code Status: Full code  Family Communication: Discussed with patient's wife at bedside   Disposition Plan:  Pending PT evaluation.    Consultants:  Nephrology   Neurology  Physical therapy.   Procedures:  None   Continuous infusions . sodium chloride    . dialysis solution 1.5% low-MG/low-CA        Antibiotics:   Anti-infectives    None       Objective   Vitals:   11/25/16 2250 11/26/16 0425 11/26/16 0429 11/26/16 0907  BP:  127/71  (!) 98/56  Pulse:  62  63  Resp:  18  16  Temp:  98.1 F (36.7 C)  97.8 F (36.6 C)  TempSrc:  Oral  Oral  SpO2:  100%  100%  Weight: 88.4 kg (194 lb 14.2 oz)  88.4 kg (194 lb 14.2 oz)   Height:        Intake/Output Summary (Last 24 hours) at 11/26/16 1315 Last data filed at 11/26/16 1120  Gross per 24 hour  Intake            15115 ml  Output            18212 ml  Net            -3097 ml   Filed Weights   11/25/16 0500 11/25/16 2250 11/26/16 0429  Weight: 88.1 kg (194 lb 3.6 oz) 88.4 kg (194 lb 14.2 oz) 88.4 kg (194 lb 14.2 oz)     Physical Examination:   Physical Exam:  Pt alert and comfortable.  Lungs: Normal respiratory effort, bilateral clear to auscultation, no crackles or wheezes.  Heart: Regular rate and rhythm, S1 and S2 normal, no murmurs, rubs auscultated Abdomen: BS  normoactive,soft,nondistended,non-tender to palpation,no organomegaly Extremities: No pretibial edema, no erythema, no cyanosis, no clubbing Neuro : Alert and oriented to time, place and person, No focal deficits, no spasms or contracture on exam       Skin: No rashes seen on exam     Data Reviewed: I have personally reviewed following labs and imaging studies  CBG:  Recent Labs Lab 11/25/16 1956 11/26/16 0002 11/26/16 0424 11/26/16 0743 11/26/16 1114  GLUCAP 228* 206* 170* 146* 172*    CBC:  Recent Labs Lab 11/22/16 0001 11/22/16 2039 11/22/16 2301 11/23/16 0359 11/24/16 0606  WBC 7.3 7.8  --  7.8 6.9  NEUTROABS 4.1 5.3  --   --   --   HGB 11.4* 11.4* 11.9* 11.5* 11.2*  HCT 32.5* 33.4* 35.0* 35.1* 33.8*  MCV 84.0 84.1  --   86.2 85.6  PLT 172 183  --  196 132    Basic Metabolic Panel:  Recent Labs Lab 11/22/16 2039 11/22/16 2301 11/22/16 2309 11/23/16 0359 11/24/16 0606 11/25/16 0455 11/26/16 1104  NA 134* 137  --  137 136 133* 132*  K 3.5 3.5  --  3.9 3.4* 3.1* 4.1  CL 95* 95*  --  98* 96* 97* 94*  CO2 25  --   --  27 25 20* 24  GLUCOSE 297* 288*  --  153* 91 198* 186*  BUN 55* 55*  --  59* 64* 65* 66*  CREATININE 18.25* >18.00*  --  18.37* 18.86* 19.31* 18.66*  CALCIUM 7.0*  --   --  7.4* 7.2* 7.1* 7.0*  MG  --   --  1.7 2.0  --   --   --   PHOS  --   --  5.6* 6.0*  --  6.8* 6.6*    No results found for this or any previous visit (from the past 240 hour(s)).   Liver Function Tests:  Recent Labs Lab 11/22/16 2039 11/23/16 0359 11/24/16 0606 11/25/16 0455 11/26/16 1104  AST 26 25 23   --   --   ALT 15* 15* 13*  --   --   ALKPHOS 119 121 108  --   --   BILITOT 0.6 0.5 0.5  --   --   PROT 6.2* 6.4* 6.0*  --   --   ALBUMIN 2.5* 2.4* 2.1* 2.0* 2.3*   No results for input(s): LIPASE, AMYLASE in the last 168 hours. No results for input(s): AMMONIA in the last 168 hours.  Cardiac Enzymes:  Recent Labs Lab 11/22/16 2039 11/23/16 0359 11/23/16 1004 11/23/16 1524 11/24/16 0606  CKTOTAL 770*  --   --   --  662*  TROPONINI  --  0.08* 0.07* 0.06*  --    BNP (last 3 results) No results for input(s): BNP in the last 8760 hours.  ProBNP (last 3 results) No results for input(s): PROBNP in the last 8760 hours.    Studies: Dg Cervical Spine Complete  Result Date: 11/22/2016 CLINICAL DATA:  Seizure activity with neck pain, initial encounter EXAM: CERVICAL SPINE - COMPLETE 4+ VIEW COMPARISON:  None. FINDINGS: Seven cervical segments are well visualized. Vertebral body height is well maintained. Disc space narrowing is noted at C5-6 with endplate sclerosis and mild retrolisthesis of C5 with respect to C6. No soft tissue abnormality is noted. The neural foramina are widely patent  bilaterally. Dialysis catheter is noted on the right. The odontoid is within normal limits. IMPRESSION: Degenerative changes at C5-6.  No acute abnormality noted.  Electronically Signed   By: Inez Catalina M.D.   On: 11/22/2016 21:18   Ct Head Wo Contrast  Result Date: 11/22/2016 CLINICAL DATA:  Right lower extremity tremors EXAM: CT HEAD WITHOUT CONTRAST TECHNIQUE: Contiguous axial images were obtained from the base of the skull through the vertex without intravenous contrast. COMPARISON:  None. FINDINGS: Brain: No mass lesion, intraparenchymal hemorrhage or extra-axial collection. No evidence of acute cortical infarct. Brain parenchyma and CSF-containing spaces are normal for age. Vascular: No hyperdense vessel or unexpected calcification. Skull: Normal visualized skull base, calvarium and extracranial soft tissues. Sinuses/Orbits: Near complete opacification of the right maxillary sinus by high density secretions. Moderate opacification of the right frontal and ethmoid sinuses. Normal orbits. IMPRESSION: 1. Normal brain.  No acute intracranial abnormality. 2. Right ostiomeatal complex pattern sinonasal obstruction. Electronically Signed   By: Ulyses Jarred M.D.   On: 11/22/2016 03:01   Mr Brain Wo Contrast  Result Date: 11/23/2016 CLINICAL DATA:  51 year old male with seizure like activity. Right lower extremity twitching. Hyperglycemia at presentation. EXAM: MRI HEAD WITHOUT CONTRAST TECHNIQUE:   IMPRESSION: 1.  No acute intracranial abnormality identified. 2. 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 3. Evidence of left optic nerve atrophy. 4. Moderate to severe paranasal sinus mucosal thickening primarily on the right. Electronically Signed   By: Genevie Ann M.D.   On: 11/23/2016 09:12    Scheduled Meds: .  calcitRIOL  0.25 mcg Oral Daily  . calcium acetate  2,001 mg Oral TID WC  . carvedilol  6.25 mg Oral BID WC  . divalproex  500 mg Oral Q8H  . feeding supplement (PRO-STAT SUGAR FREE 64)  30 mL Oral BID  . gentamicin cream  1 application Topical Daily  . insulin aspart  0-9 Units Subcutaneous Q4H  . insulin aspart protamine- aspart  4 Units Subcutaneous Q breakfast  . multivitamin  1 tablet Oral QHS  . sodium chloride flush  3 mL Intravenous Q12H  . sodium chloride flush  3 mL Intravenous Q12H  . triamcinolone cream   Topical TID      Time spent: 25 min  Tateanna Bach   [facility] Pager [phone] If 7PM-7AM, please contact night-coverage at [email or web address], Office  [phone]  password [email or web address] 11/26/2016, 1:15 PM  LOS: 3 days

## 2016-11-26 NOTE — Progress Notes (Signed)
PT Cancellation Note  Patient Details Name: Bryan Wilkerson MRN: [id number] DOB: 01-26-1966   Cancelled Treatment:    Reason Eval/Treat Not Completed: Medical issues which prohibited therapy. Pt reports that he just received some medication that has made him nauseated. Requests that PT check back later. Will check back this afternoon as time allows. If not able to see this afternoon, will check back tomorrow to improve mobility.    Scheryl Marten PT, DPT  [phone]  11/26/2016, 11:35 AM

## 2016-11-27 LAB — RENAL FUNCTION PANEL
Albumin: 2.1 g/dL — ABNORMAL LOW (ref 3.5–5.0)
Anion gap: 13 (ref 5–15)
BUN: 71 mg/dL — ABNORMAL HIGH (ref 6–20)
CO2: 24 mmol/L (ref 22–32)
Calcium: 7.1 mg/dL — ABNORMAL LOW (ref 8.9–10.3)
Chloride: 95 mmol/L — ABNORMAL LOW (ref 101–111)
Creatinine, Ser: 18.07 mg/dL — ABNORMAL HIGH (ref 0.61–1.24)
GFR calc Af Amer: 3 mL/min — ABNORMAL LOW (ref >60)
GFR calc non Af Amer: 3 mL/min — ABNORMAL LOW (ref >60)
Glucose, Bld: 151 mg/dL — ABNORMAL HIGH (ref 65–99)
Phosphorus: 6.3 mg/dL — ABNORMAL HIGH (ref 2.5–4.6)
Potassium: 3.5 mmol/L (ref 3.5–5.1)
Sodium: 132 mmol/L — ABNORMAL LOW (ref 135–145)

## 2016-11-27 LAB — GLUCOSE, CAPILLARY
GLUCOSE-CAPILLARY: 108 mg/dL — AB (ref 65–99)
GLUCOSE-CAPILLARY: 143 mg/dL — AB (ref 65–99)
Glucose-Capillary: 151 mg/dL — ABNORMAL HIGH (ref 65–99)
Glucose-Capillary: 181 mg/dL — ABNORMAL HIGH (ref 65–99)

## 2016-11-27 MED ORDER — RENA-VITE PO TABS: 1.0000 | ORAL_TABLET | Freq: Every day | ORAL | 0 refills | Status: DC

## 2016-11-27 MED ORDER — DIVALPROEX SODIUM 500 MG PO DR TAB: 500.0000 mg | DELAYED_RELEASE_TABLET | Freq: Three times a day (TID) | ORAL | 0 refills | Status: DC

## 2016-11-27 MED ORDER — CALCITRIOL 0.25 MCG PO CAPS: 0.2500 ug | ORAL_CAPSULE | Freq: Every day | ORAL | 0 refills | Status: DC

## 2016-11-27 MED ORDER — CARVEDILOL 6.25 MG PO TABS: 6.2500 mg | ORAL_TABLET | Freq: Two times a day (BID) | ORAL | 0 refills | Status: AC

## 2016-11-27 MED ORDER — PRO-STAT SUGAR FREE PO LIQD: 30.0000 mL | Freq: Two times a day (BID) | ORAL | 0 refills | Status: DC

## 2016-11-27 MED ORDER — CALCIUM ACETATE (PHOS BINDER) 667 MG PO CAPS: 2668.0000 mg | ORAL_CAPSULE | Freq: Three times a day (TID) | ORAL | 1 refills | Status: DC

## 2016-11-27 NOTE — Discharge Summary (Addendum)
Physician Discharge Summary  Bryan Wilkerson ZGY:[id number] DOB: 1966-03-30 DOA: 11/22/2016  PCP: Patient, No Pcp Per  Admit date: 11/22/2016 Discharge date: 11/27/2016  Admitted From: Home.  Disposition:  Home.   Recommendations for Outpatient Follow-up:  1. Follow up with PCP in 1-2 weeks 2. Please obtain BMP/CBC in one week Please follow up with outpatient neurology.   Discharge Condition:stable.  CODE STATUS: full code.  Diet recommendation: renal diet.   Brief/Interim Summary: 51 year old male with history of ESRD on peritoneal dialysis, status post left radiocephalic AV fistula placement on 07/20/2016, diabetes mellitus, hypertension, anemia who initially came to [facility] for right leg cramping and diffuse shaking of the body. Patient denies loss of consciousness. Patient was discharged from the ED and then came back to [facility] with similar complaints. Neurology was consulted by the ED physician and patient was started on Depakote and EEG MRI was ordered. EEG and MRI was not significant.   Discharge Diagnoses:  Active Problems:   CHF (congestive heart failure), NYHA class II (HCC)   Essential hypertension, benign   ESRD on dialysis ([place])   Observed seizure-like activity (HCC)   DM (diabetes mellitus), type 2 with complications (HCC)   Seizure ([place])   Hypocalcemia   Seizure-like activity versus muscle spasm-  patient has been started on Depakote, EEG and MRI ordered, not significant for any acute pathology.  depakote changed to oral today. Neurology consulted and recommendations given.  Currently denies any complaints today, but at the same time he hasn't ambulated. So PT consulted. No further recommendations.  So far no more episodes of seizure like activity.   Hypocalcemia-not significant enough to cause muscle spasms. Monitor calcium level.   Hypokalemia: repleted. Repeat level good.   ESRD on peritoneal dialysis- further recommendations as per nephrology.   Diabetes mellitus- CBG (last 3)  11/24/16 0417 11/24/16 0745 11/24/16 1207  GLUCAP 168* 72 97   Recommend to continue with SSI.  No further changes.   Hypertension- hypotensive episodes, holding hydralazine . Decreased the dose of coreg from 12.5 to 6.25 mg BID.   Elevated troponin- probably from ESRD, doesn't appear to be demand ischemia.  No chest pain or sob.    Mild Hyponatremia;  From esrd. Asymptomatic.   Pt had a h/o  Diastolic CHF in the past but now his EF is normal and there is no mention of the diastolic function in the last echo in 09/2015. Not in exacerbation at this time.    Discharge Instructions  Discharge Instructions    Diet - low sodium heart healthy    Complete by:  As directed    Discharge instructions    Complete by:  As directed    Follow up with PCP in one week.  Follow up with neurology in  2 weeks.     Allergies as of 11/27/2016      Reactions   Penicillins Other (See Comments)   UNSPECIFIED REACTION FROM CHILDHOOD Has patient had a PCN reaction causing immediate rash, facial/tongue/throat swelling, SOB or lightheadedness with hypotension:Yes Has patient had a PCN reaction causing severe rash involving mucus membranes or skin necrosis:No Has patient had a PCN reaction that required hospitalization:Yes Has patient had a PCN reaction occurring within the last 10 years:No If all of the above answers are "NO", then may proceed with Cephalosporin use.      Medication List    STOP taking these medications   glucose blood test strip Commonly known as:  [employer] BLOOD GLUCOSE TEST  STRIPS   hydrALAZINE 100 MG tablet Commonly known as:  APRESOLINE   oxyCODONE-acetaminophen 5-325 MG tablet Commonly known as:  ROXICET     TAKE these medications   calcitRIOL 0.25 MCG capsule Commonly known as:  ROCALTROL Take 1 capsule (0.25 mcg total) by mouth daily. Start taking on:  11/28/2016   calcium acetate 667 MG capsule Commonly known as:   PHOSLO Take 4 capsules (2,668 mg total) by mouth 3 (three) times daily with meals. What changed:  how much to take   carvedilol 6.25 MG tablet Commonly known as:  COREG Take 1 tablet (6.25 mg total) by mouth 2 (two) times daily with a meal. What changed:  medication strength  how much to take  when to take this   divalproex 500 MG DR tablet Commonly known as:  DEPAKOTE Take 1 tablet (500 mg total) by mouth every 8 (eight) hours.   feeding supplement (PRO-STAT SUGAR FREE 64) Liqd Take 30 mLs by mouth 2 (two) times daily.   insulin NPH-regular Human (70-30) 100 UNIT/ML injection Commonly known as:  NOVOLIN 70/30 Inject 4 Units into the skin daily.   multivitamin Tabs tablet Take 1 tablet by mouth at bedtime.   STELARA 45 MG/0.5ML Sosy injection Generic drug:  ustekinumab Inject 45 mg into the skin See admin instructions. EVERY 12 WEEKS   triamcinolone ointment 0.1 % Commonly known as:  KENALOG Apply 1 application topically as needed (psorasis).      Follow-up Information    [facility]. Schedule an appointment as soon as possible for a visit in 2 week(s).   Contact information: [place], [place] [address] [phone]         Allergies  Allergen Reactions  . Penicillins Other (See Comments)    UNSPECIFIED REACTION FROM CHILDHOOD Has patient had a PCN reaction causing immediate rash, facial/tongue/throat swelling, SOB or lightheadedness with hypotension:Yes Has patient had a PCN reaction causing severe rash involving mucus membranes or skin necrosis:No Has patient had a PCN reaction that required hospitalization:Yes Has patient had a PCN reaction occurring within the last 10 years:No If all of the above answers are "NO", then may proceed with Cephalosporin use.      Consultations:  Nephrology.    Procedures/Studies: Dg Cervical Spine Complete  Result Date: 11/22/2016 CLINICAL DATA:  Seizure activity with  neck pain, initial encounter EXAM: CERVICAL SPINE - COMPLETE 4+ VIEW COMPARISON:  None. FINDINGS: Seven cervical segments are well visualized. Vertebral body height is well maintained. Disc space narrowing is noted at C5-6 with endplate sclerosis and mild retrolisthesis of C5 with respect to C6. No soft tissue abnormality is noted. The neural foramina are widely patent bilaterally. Dialysis catheter is noted on the right. The odontoid is within normal limits. IMPRESSION: Degenerative changes at C5-6.  No acute abnormality noted. Electronically Signed   By: Inez Catalina M.D.   On: 11/22/2016 21:18   Ct Head Wo Contrast  Result Date: 11/22/2016 CLINICAL DATA:  Right lower extremity tremors EXAM: CT HEAD WITHOUT CONTRAST TECHNIQUE: Contiguous axial images were obtained from the base of the skull through the vertex without intravenous contrast. COMPARISON:  None. FINDINGS: Brain: No mass lesion, intraparenchymal hemorrhage or extra-axial collection. No evidence of acute cortical infarct. Brain parenchyma and CSF-containing spaces are normal for age. Vascular: No hyperdense vessel or unexpected calcification. Skull: Normal visualized skull base, calvarium and extracranial soft tissues. Sinuses/Orbits: Near complete opacification of the right maxillary sinus by high density secretions. Moderate  opacification of the right frontal and ethmoid sinuses. Normal orbits. IMPRESSION: 1. Normal brain.  No acute intracranial abnormality. 2. Right ostiomeatal complex pattern sinonasal obstruction. Electronically Signed   By: Ulyses Jarred M.D.   On: 11/22/2016 03:01   Mr Brain Wo Contrast  Result Date: 11/23/2016 CLINICAL DATA:  51 year old male with seizure like activity. Right lower extremity twitching. Hyperglycemia at presentation. EXAM: MRI HEAD WITHOUT CONTRAST TECHNIQUE: Multiplanar, multiecho pulse sequences of the brain and surrounding structures were obtained without intravenous contrast. COMPARISON:  Head CT  without contrast 11/22/2016. FINDINGS: Brain: No restricted diffusion or evidence of acute infarction. No cerebral edema identified. Evidence of a chronic lacunar infarct in the central right thalamus. Mild patchy and scattered mostly periventricular and central cerebral white matter T2 and FLAIR hyperintensity which is mildly or moderately advanced for age. Possible small chronic micro hemorrhages in the left periventricular white matter and right frontal operculum (series 9, images 14 and 15). Hippocampal formations and mesial temporal lobe structures appear symmetric and within normal limits. No midline shift, mass effect, evidence of mass lesion, ventriculomegaly, extra-axial collection or acute intracranial hemorrhage. Cervicomedullary junction and pituitary are within normal limits. Vascular: Major intracranial vascular flow voids are preserved. Skull and upper cervical spine: Heterogeneous decreased T1 bone marrow signal at the skullbase and in the visible cervical spine. Calvarium bone marrow signal is normal. No destructive osseous lesion identified. Sinuses/Orbits: Asymmetric T2 hyperintensity throughout the left optic nerve from anterior to the chiasm (series 11, images 22 through 27) suggesting atrophy. Postoperative changes to both globes. Other orbits soft tissues appear normal. Moderate to severe paranasal sinus mucosal thickening primarily on the right side and sparing the sphenoid sinuses. Other: Visible internal auditory structures appear normal. Mastoids are clear. Negative scalp soft tissues. IMPRESSION: 1.  No acute intracranial abnormality identified. 2. 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 3. Evidence of left optic nerve atrophy. 4. Moderate to severe paranasal sinus mucosal thickening primarily  on the right. Electronically Signed   By: Genevie Ann M.D.   On: 11/23/2016 09:12     Subjective: No new complaints.   Discharge Exam: Vitals:   11/27/16 0405 11/27/16 0911  BP: 131/80 124/82  Pulse: 71 (!) 54  Resp: 16 17  Temp: 98.3 F (36.8 C) 98.1 F (36.7 C)   Vitals:   11/26/16 2116 11/27/16 0405 11/27/16 0500 11/27/16 0911  BP: (!) 150/87 131/80  124/82  Pulse: (!) 58 71  (!) 54  Resp: 17 16  17   Temp: 97.8 F (36.6 C) 98.3 F (36.8 C)  98.1 F (36.7 C)  TempSrc: Oral Oral  Oral  SpO2: 99% 100%  100%  Weight: 88.7 kg (195 lb 8.8 oz)  88.7 kg (195 lb 8.8 oz)   Height:        General: Pt is alert, awake, not in acute distress Cardiovascular: RRR, S1/S2 +, no rubs, no gallops Respiratory: CTA bilaterally, no wheezing, no rhonchi Abdominal: Soft, NT, ND, bowel sounds + Extremities: no edema, no cyanosis    The results of significant diagnostics from this hospitalization (including imaging, microbiology, ancillary and laboratory) are listed below for reference.     Microbiology: No results found for this or any previous visit (from the past 240 hour(s)).   Labs: BNP (last 3 results) No results for input(s): BNP  in the last 8760 hours. Basic Metabolic Panel:  Recent Labs Lab 11/22/16 2309 11/23/16 0359 11/24/16 0606 11/25/16 0455 11/26/16 1104 11/27/16 0420  NA  --  137 136 133* 132* 132*  K  --  3.9 3.4* 3.1* 4.1 3.5  CL  --  98* 96* 97* 94* 95*  CO2  --  27 25 20* 24 24  GLUCOSE  --  153* 91 198* 186* 151*  BUN  --  59* 64* 65* 66* 71*  CREATININE  --  18.37* 18.86* 19.31* 18.66* 18.07*  CALCIUM  --  7.4* 7.2* 7.1* 7.0* 7.1*  MG 1.7 2.0  --   --   --   --   PHOS 5.6* 6.0*  --  6.8* 6.6* 6.3*   Liver Function Tests:  Recent Labs Lab 11/22/16 2039 11/23/16 0359 11/24/16 0606 11/25/16 0455 11/26/16 1104 11/27/16 0420  AST 26 25 23   --   --   --   ALT 15* 15* 13*  --   --   --   ALKPHOS 119 121 108  --   --   --   BILITOT 0.6 0.5 0.5  --    --   --   PROT 6.2* 6.4* 6.0*  --   --   --   ALBUMIN 2.5* 2.4* 2.1* 2.0* 2.3* 2.1*   No results for input(s): LIPASE, AMYLASE in the last 168 hours. No results for input(s): AMMONIA in the last 168 hours. CBC:  Recent Labs Lab 11/22/16 0001 11/22/16 2039 11/22/16 2301 11/23/16 0359 11/24/16 0606  WBC 7.3 7.8  --  7.8 6.9  NEUTROABS 4.1 5.3  --   --   --   HGB 11.4* 11.4* 11.9* 11.5* 11.2*  HCT 32.5* 33.4* 35.0* 35.1* 33.8*  MCV 84.0 84.1  --  86.2 85.6  PLT 172 183  --  196 176   Cardiac Enzymes:  Recent Labs Lab 11/22/16 2039 11/23/16 0359 11/23/16 1004 11/23/16 1524 11/24/16 0606  CKTOTAL 770*  --   --   --  662*  TROPONINI  --  0.08* 0.07* 0.06*  --    BNP: Invalid input(s): POCBNP CBG:  Recent Labs Lab 11/26/16 2005 11/27/16 0042 11/27/16 0402 11/27/16 0744 11/27/16 1148  GLUCAP 118* 181* 151* 108* 143*   D-Dimer No results for input(s): DDIMER in the last 72 hours. Hgb A1c No results for input(s): HGBA1C in the last 72 hours. Lipid Profile No results for input(s): CHOL, HDL, LDLCALC, TRIG, CHOLHDL, LDLDIRECT in the last 72 hours. Thyroid function studies No results for input(s): TSH, T4TOTAL, T3FREE, THYROIDAB in the last 72 hours.  Invalid input(s): FREET3 Anemia work up No results for input(s): VITAMINB12, FOLATE, FERRITIN, TIBC, IRON, RETICCTPCT in the last 72 hours. Urinalysis    Component Value Date/Time   COLORURINE YELLOW 12/30/2012 0241   APPEARANCEUR CLEAR 12/30/2012 0241   LABSPEC 1.010 12/30/2012 0241   PHURINE 6.5 12/30/2012 0241   GLUCOSEU NEGATIVE 12/30/2012 0241   HGBUR SMALL (A) 12/30/2012 0241   BILIRUBINUR NEGATIVE 12/30/2012 0241   KETONESUR NEGATIVE 12/30/2012 0241   PROTEINUR 100 (A) 12/30/2012 0241   UROBILINOGEN 1.0 12/30/2012 0241   NITRITE NEGATIVE 12/30/2012 0241   LEUKOCYTESUR NEGATIVE 12/30/2012 0241   Sepsis Labs Invalid input(s): PROCALCITONIN,  WBC,  LACTICIDVEN Microbiology No results found for this or  any previous visit (from the past 240 hour(s)).   Time coordinating discharge: Over 30 minutes  SIGNED:   Hosie Poisson, MD  [facility] 11/27/2016, 12:16 PM  Pager   If 7PM-7AM, please contact night-coverage [email or web address] Password [email or web address]

## 2016-11-27 NOTE — Progress Notes (Signed)
Bryan Wilkerson to be D/C'd Home per MD order.  Discussed prescriptions and follow up appointments with the patient. Prescriptions given to patient, medication list explained in detail. Pt verbalized understanding.  Allergies as of 11/27/2016      Reactions   Penicillins Other (See Comments)   UNSPECIFIED REACTION FROM CHILDHOOD Has patient had a PCN reaction causing immediate rash, facial/tongue/throat swelling, SOB or lightheadedness with hypotension:Yes Has patient had a PCN reaction causing severe rash involving mucus membranes or skin necrosis:No Has patient had a PCN reaction that required hospitalization:Yes Has patient had a PCN reaction occurring within the last 10 years:No If all of the above answers are "NO", then may proceed with Cephalosporin use.      Medication List    STOP taking these medications   glucose blood test strip Commonly known as:  [employer] BLOOD GLUCOSE TEST STRIPS   hydrALAZINE 100 MG tablet Commonly known as:  APRESOLINE   oxyCODONE-acetaminophen 5-325 MG tablet Commonly known as:  ROXICET     TAKE these medications   calcitRIOL 0.25 MCG capsule Commonly known as:  ROCALTROL Take 1 capsule (0.25 mcg total) by mouth daily. Start taking on:  11/28/2016   calcium acetate 667 MG capsule Commonly known as:  PHOSLO Take 4 capsules (2,668 mg total) by mouth 3 (three) times daily with meals. What changed:  how much to take   carvedilol 6.25 MG tablet Commonly known as:  COREG Take 1 tablet (6.25 mg total) by mouth 2 (two) times daily with a meal. What changed:  medication strength  how much to take  when to take this   divalproex 500 MG DR tablet Commonly known as:  DEPAKOTE Take 1 tablet (500 mg total) by mouth every 8 (eight) hours.   feeding supplement (PRO-STAT SUGAR FREE 64) Liqd Take 30 mLs by mouth 2 (two) times daily.   insulin NPH-regular Human (70-30) 100 UNIT/ML injection Commonly known as:  NOVOLIN 70/30 Inject 4 Units into the skin  daily.   multivitamin Tabs tablet Take 1 tablet by mouth at bedtime.   STELARA 45 MG/0.5ML Sosy injection Generic drug:  ustekinumab Inject 45 mg into the skin See admin instructions. EVERY 12 WEEKS   triamcinolone ointment 0.1 % Commonly known as:  KENALOG Apply 1 application topically as needed (psorasis).       Vitals:   11/27/16 0405 11/27/16 0911  BP: 131/80 124/82  Pulse: 71 (!) 54  Resp: 16 17  Temp: 98.3 F (36.8 C) 98.1 F (36.7 C)    Skin clean, dry and intact without evidence of skin break down, no evidence of skin tears noted. IV catheter discontinued intact. Site without signs and symptoms of complications. Dressing and pressure applied. Pt denies pain at this time. No complaints noted.  An After Visit Summary was printed and given to the patient. Patient escorted via [place], and D/C home via private auto.  Haywood Lasso BSN, RN [facility] [address] Phone [phone]

## 2016-11-27 NOTE — Progress Notes (Signed)
[facility] Progress Note  Subjective:   Denies problems with PT yesterday  Feels like he can go home. Objective Vitals:   11/26/16 1725 11/26/16 2116 11/27/16 0405 11/27/16 0500  BP: 124/86 (!) 150/87 131/80   Pulse: 64 (!) 58 71   Resp: 16 17 16    Temp: 98 F (36.7 C) 97.8 F (36.6 C) 98.3 F (36.8 C)   TempSrc: Oral Oral Oral   SpO2: 98% 99% 100%   Weight:  88.7 kg (195 lb 8.8 oz)  88.7 kg (195 lb 8.8 oz)  Height:       Physical Exam General: NAD - eating  well Heart: RRR Lungs: clear throughout Abdomen: soft PD cath intact Extremities: no LE edema  Additional Objective Labs: Basic Metabolic Panel:  Recent Labs Lab 11/25/16 0455 11/26/16 1104 11/27/16 0420  NA 133* 132* 132*  K 3.1* 4.1 3.5  CL 97* 94* 95*  CO2 20* 24 24  GLUCOSE 198* 186* 151*  BUN 65* 66* 71*  CREATININE 19.31* 18.66* 18.07*  CALCIUM 7.1* 7.0* 7.1*  PHOS 6.8* 6.6* 6.3*    Recent Labs Lab 11/22/16 2039 11/23/16 0359 11/24/16 0606 11/25/16 0455 11/26/16 1104 11/27/16 0420  AST 26 25 23   --   --   --   ALT 15* 15* 13*  --   --   --   ALKPHOS 119 121 108  --   --   --   BILITOT 0.6 0.5 0.5  --   --   --   PROT 6.2* 6.4* 6.0*  --   --   --   ALBUMIN 2.5* 2.4* 2.1* 2.0* 2.3* 2.1*     Recent Labs Lab 11/22/16 0001 11/22/16 2039 11/22/16 2301 11/23/16 0359 11/24/16 0606  WBC 7.3 7.8  --  7.8 6.9  NEUTROABS 4.1 5.3  --   --   --   HGB 11.4* 11.4* 11.9* 11.5* 11.2*  HCT 32.5* 33.4* 35.0* 35.1* 33.8*  MCV 84.0 84.1  --  86.2 85.6  PLT 172 183  --  196 176     Recent Labs Lab 11/22/16 2039 11/23/16 0359 11/23/16 1004 11/23/16 1524 11/24/16 0606  CKTOTAL 770*  --   --   --  662*  TROPONINI  --  0.08* 0.07* 0.06*  --    CBG:  Recent Labs Lab 11/26/16 1114 11/26/16 1708 11/26/16 2005 11/27/16 0042 11/27/16 0744  GLUCAP 172* 129* 118* 181* 108*    Lab Results  Component Value Date   INR 1.05 12/29/2012   Medications: . sodium chloride     . dialysis solution 1.5% low-MG/low-CA     . calcitRIOL  0.25 mcg Oral Daily  . calcium acetate  2,668 mg Oral TID WC  . carvedilol  6.25 mg Oral BID WC  . divalproex  500 mg Oral Q8H  . feeding supplement (PRO-STAT SUGAR FREE 64)  30 mL Oral BID  . gentamicin cream  1 application Topical Daily  . insulin aspart  0-9 Units Subcutaneous Q4H  . insulin aspart protamine- aspart  4 Units Subcutaneous Q breakfast  . multivitamin  1 tablet Oral QHS  . sodium chloride flush  3 mL Intravenous Q12H  . sodium chloride flush  3 mL Intravenous Q12H  . triamcinolone cream   Topical TID    Dialysis Orders:  5 exchanges /24 hours.  2.5 liters. Increased to 2.75 liters during hospitalization  Dwell 1 hour 30 minutes,  Last fill 2.5 liters, drain in 4 hours.  No daytime dwell.   Background: The patient is a 51 y.o.year-old AAM. PMH DM, HTN, secondary HPT, anemia. Previously on HD, recent transition to peritoneal dialysis (CCPD). Gives history starting a couple of weeks ago of severe drawing/cramping of RLE with subsequent feeling of "flimsy legs", hard to walk d/t weak. Says walking can precipitate. Can feel it coming on. Never loses consciousness. No perioral paresthesias or issues with UE's. Came to ED with these sx. Was initially placed on Depakote and EEG/MRI ordered. Neuro is followingOf note elevated CPK 770 on 5/21.  Assessment/Plan: 1. Leg cramping/"drawing" without LOC- etiology unclear . Mild hypocalcemia but not enough to cause tetany (and no perioral numbness, Cvostek's or Trousseau's sign). (Has elevated CPK ? etiology and ? If consequence of severe sustained muscle contraction). Neuro following MRI negNormal EEG 5/22. Oral Depakote per neuro though not clearly seizures. Needs to ambulate (activity provoked PTA) 2. ESRD- recent transition to CCPD. Despite creatinine of 19, most recent clearance studies from his [facility] showed KT/V 2.1 which should reflect adequate  clearance. Continue current CCPD.  Tells me his catheter has not been removed because his clearances are marginal Fill volumes  Increased to 2.75 L 3. MBD -hypocalcemia/hyperphosphatemia- Corr Ca 8.6/ P 6.3; last iPTH was 653 - prev on calcitriol prior to change to PD - resumed calcitriol 0.25 daily--need to be sure this is done at d/c. Increased phoslo to 4 ac 4. Hypokalemia - 40 meq KCl given 5/24 - K 4.1 Friday down to 3.5 today - changed to heart healthy carb mod which will not limit K in diet 5. Elevated CPK- ? If related to what sounds like sustained muscle contraction episodes (similar to the elevated CK we see after t-c sz). [id number] on repeat 6. HTN- carvedilol/hydralazine started during admit (was taken off BP meds at [facility] a couple weeks ago by Dr. Justin Mend d/t low BP's). Hydralazine now dc'd and carvedilol down to 12.5 bid.   7. DM- insulin per primary service 8. Anemia - hgb 11.2 - not on ESA 9. Nutrition - alb low; added prostat 10. Disp - ok for d/c per renal perspective  Myriam Jacobson, PA-C [place] [phone] 11/27/2016,8:18 AM  LOS: 4 days   I have seen and examined this patient and agree with plan and assessment in the above note with renal recommendations/intervention highlighted. Changes this adm in his ESRD program included increase in ca acetate to 4ac, resumption of calcitriol,  increase in PD fill volume to 2.75 liters. TDC was left in. He can followup for PD issues at the [facility] and will notify them when discharged. Appears is to go out on Depakote.  Benelli Winther B,MD 11/27/2016 12:47 PM

## 2016-12-03 DIAGNOSIS — R8299 Other abnormal findings in urine: Secondary | ICD-10-CM | POA: Diagnosis not present

## 2016-12-03 DIAGNOSIS — Z79899 Other long term (current) drug therapy: Secondary | ICD-10-CM | POA: Diagnosis not present

## 2016-12-03 DIAGNOSIS — N186 End stage renal disease: Secondary | ICD-10-CM | POA: Diagnosis not present

## 2016-12-03 DIAGNOSIS — N2581 Secondary hyperparathyroidism of renal origin: Secondary | ICD-10-CM | POA: Diagnosis not present

## 2016-12-03 DIAGNOSIS — E440 Moderate protein-calorie malnutrition: Secondary | ICD-10-CM | POA: Diagnosis not present

## 2016-12-03 DIAGNOSIS — D630 Anemia in neoplastic disease: Secondary | ICD-10-CM | POA: Diagnosis not present

## 2016-12-03 DIAGNOSIS — Z4932 Encounter for adequacy testing for peritoneal dialysis: Secondary | ICD-10-CM | POA: Diagnosis not present

## 2016-12-03 DIAGNOSIS — D631 Anemia in chronic kidney disease: Secondary | ICD-10-CM | POA: Diagnosis not present

## 2016-12-03 DIAGNOSIS — D509 Iron deficiency anemia, unspecified: Secondary | ICD-10-CM | POA: Diagnosis not present

## 2016-12-03 DIAGNOSIS — R17 Unspecified jaundice: Secondary | ICD-10-CM | POA: Diagnosis not present

## 2016-12-04 DIAGNOSIS — N186 End stage renal disease: Secondary | ICD-10-CM | POA: Diagnosis not present

## 2016-12-04 DIAGNOSIS — N2581 Secondary hyperparathyroidism of renal origin: Secondary | ICD-10-CM | POA: Diagnosis not present

## 2016-12-04 DIAGNOSIS — D631 Anemia in chronic kidney disease: Secondary | ICD-10-CM | POA: Diagnosis not present

## 2016-12-04 DIAGNOSIS — D509 Iron deficiency anemia, unspecified: Secondary | ICD-10-CM | POA: Diagnosis not present

## 2016-12-04 DIAGNOSIS — Z4932 Encounter for adequacy testing for peritoneal dialysis: Secondary | ICD-10-CM | POA: Diagnosis not present

## 2016-12-04 DIAGNOSIS — Z79899 Other long term (current) drug therapy: Secondary | ICD-10-CM | POA: Diagnosis not present

## 2016-12-05 DIAGNOSIS — D631 Anemia in chronic kidney disease: Secondary | ICD-10-CM | POA: Diagnosis not present

## 2016-12-05 DIAGNOSIS — D509 Iron deficiency anemia, unspecified: Secondary | ICD-10-CM | POA: Diagnosis not present

## 2016-12-05 DIAGNOSIS — N186 End stage renal disease: Secondary | ICD-10-CM | POA: Diagnosis not present

## 2016-12-05 DIAGNOSIS — Z4932 Encounter for adequacy testing for peritoneal dialysis: Secondary | ICD-10-CM | POA: Diagnosis not present

## 2016-12-05 DIAGNOSIS — N2581 Secondary hyperparathyroidism of renal origin: Secondary | ICD-10-CM | POA: Diagnosis not present

## 2016-12-05 DIAGNOSIS — Z79899 Other long term (current) drug therapy: Secondary | ICD-10-CM | POA: Diagnosis not present

## 2016-12-06 DIAGNOSIS — N2581 Secondary hyperparathyroidism of renal origin: Secondary | ICD-10-CM | POA: Diagnosis not present

## 2016-12-06 DIAGNOSIS — D509 Iron deficiency anemia, unspecified: Secondary | ICD-10-CM | POA: Diagnosis not present

## 2016-12-06 DIAGNOSIS — D631 Anemia in chronic kidney disease: Secondary | ICD-10-CM | POA: Diagnosis not present

## 2016-12-06 DIAGNOSIS — Z79899 Other long term (current) drug therapy: Secondary | ICD-10-CM | POA: Diagnosis not present

## 2016-12-06 DIAGNOSIS — Z4932 Encounter for adequacy testing for peritoneal dialysis: Secondary | ICD-10-CM | POA: Diagnosis not present

## 2016-12-06 DIAGNOSIS — N186 End stage renal disease: Secondary | ICD-10-CM | POA: Diagnosis not present

## 2016-12-07 DIAGNOSIS — E1165 Type 2 diabetes mellitus with hyperglycemia: Secondary | ICD-10-CM | POA: Diagnosis not present

## 2016-12-07 DIAGNOSIS — N186 End stage renal disease: Secondary | ICD-10-CM | POA: Diagnosis not present

## 2016-12-07 DIAGNOSIS — N2581 Secondary hyperparathyroidism of renal origin: Secondary | ICD-10-CM | POA: Diagnosis not present

## 2016-12-07 DIAGNOSIS — R809 Proteinuria, unspecified: Secondary | ICD-10-CM | POA: Diagnosis not present

## 2016-12-07 DIAGNOSIS — D631 Anemia in chronic kidney disease: Secondary | ICD-10-CM | POA: Diagnosis not present

## 2016-12-07 DIAGNOSIS — I10 Essential (primary) hypertension: Secondary | ICD-10-CM | POA: Diagnosis not present

## 2016-12-07 DIAGNOSIS — D509 Iron deficiency anemia, unspecified: Secondary | ICD-10-CM | POA: Diagnosis not present

## 2016-12-07 DIAGNOSIS — Z4932 Encounter for adequacy testing for peritoneal dialysis: Secondary | ICD-10-CM | POA: Diagnosis not present

## 2016-12-07 DIAGNOSIS — Z79899 Other long term (current) drug therapy: Secondary | ICD-10-CM | POA: Diagnosis not present

## 2016-12-08 DIAGNOSIS — D631 Anemia in chronic kidney disease: Secondary | ICD-10-CM | POA: Diagnosis not present

## 2016-12-08 DIAGNOSIS — N186 End stage renal disease: Secondary | ICD-10-CM | POA: Diagnosis not present

## 2016-12-08 DIAGNOSIS — Z79899 Other long term (current) drug therapy: Secondary | ICD-10-CM | POA: Diagnosis not present

## 2016-12-08 DIAGNOSIS — Z4932 Encounter for adequacy testing for peritoneal dialysis: Secondary | ICD-10-CM | POA: Diagnosis not present

## 2016-12-08 DIAGNOSIS — D509 Iron deficiency anemia, unspecified: Secondary | ICD-10-CM | POA: Diagnosis not present

## 2016-12-08 DIAGNOSIS — N2581 Secondary hyperparathyroidism of renal origin: Secondary | ICD-10-CM | POA: Diagnosis not present

## 2016-12-09 DIAGNOSIS — D631 Anemia in chronic kidney disease: Secondary | ICD-10-CM | POA: Diagnosis not present

## 2016-12-09 DIAGNOSIS — D509 Iron deficiency anemia, unspecified: Secondary | ICD-10-CM | POA: Diagnosis not present

## 2016-12-09 DIAGNOSIS — N2581 Secondary hyperparathyroidism of renal origin: Secondary | ICD-10-CM | POA: Diagnosis not present

## 2016-12-09 DIAGNOSIS — Z79899 Other long term (current) drug therapy: Secondary | ICD-10-CM | POA: Diagnosis not present

## 2016-12-09 DIAGNOSIS — Z4932 Encounter for adequacy testing for peritoneal dialysis: Secondary | ICD-10-CM | POA: Diagnosis not present

## 2016-12-09 DIAGNOSIS — N186 End stage renal disease: Secondary | ICD-10-CM | POA: Diagnosis not present

## 2016-12-10 DIAGNOSIS — D631 Anemia in chronic kidney disease: Secondary | ICD-10-CM | POA: Diagnosis not present

## 2016-12-10 DIAGNOSIS — Z4932 Encounter for adequacy testing for peritoneal dialysis: Secondary | ICD-10-CM | POA: Diagnosis not present

## 2016-12-10 DIAGNOSIS — D509 Iron deficiency anemia, unspecified: Secondary | ICD-10-CM | POA: Diagnosis not present

## 2016-12-10 DIAGNOSIS — N186 End stage renal disease: Secondary | ICD-10-CM | POA: Diagnosis not present

## 2016-12-10 DIAGNOSIS — Z79899 Other long term (current) drug therapy: Secondary | ICD-10-CM | POA: Diagnosis not present

## 2016-12-10 DIAGNOSIS — N2581 Secondary hyperparathyroidism of renal origin: Secondary | ICD-10-CM | POA: Diagnosis not present

## 2016-12-11 DIAGNOSIS — N186 End stage renal disease: Secondary | ICD-10-CM | POA: Diagnosis not present

## 2016-12-11 DIAGNOSIS — N2581 Secondary hyperparathyroidism of renal origin: Secondary | ICD-10-CM | POA: Diagnosis not present

## 2016-12-11 DIAGNOSIS — D631 Anemia in chronic kidney disease: Secondary | ICD-10-CM | POA: Diagnosis not present

## 2016-12-11 DIAGNOSIS — Z4932 Encounter for adequacy testing for peritoneal dialysis: Secondary | ICD-10-CM | POA: Diagnosis not present

## 2016-12-11 DIAGNOSIS — D509 Iron deficiency anemia, unspecified: Secondary | ICD-10-CM | POA: Diagnosis not present

## 2016-12-11 DIAGNOSIS — Z79899 Other long term (current) drug therapy: Secondary | ICD-10-CM | POA: Diagnosis not present

## 2016-12-12 DIAGNOSIS — Z79899 Other long term (current) drug therapy: Secondary | ICD-10-CM | POA: Diagnosis not present

## 2016-12-12 DIAGNOSIS — N186 End stage renal disease: Secondary | ICD-10-CM | POA: Diagnosis not present

## 2016-12-12 DIAGNOSIS — D509 Iron deficiency anemia, unspecified: Secondary | ICD-10-CM | POA: Diagnosis not present

## 2016-12-12 DIAGNOSIS — N2581 Secondary hyperparathyroidism of renal origin: Secondary | ICD-10-CM | POA: Diagnosis not present

## 2016-12-12 DIAGNOSIS — D631 Anemia in chronic kidney disease: Secondary | ICD-10-CM | POA: Diagnosis not present

## 2016-12-12 DIAGNOSIS — Z4932 Encounter for adequacy testing for peritoneal dialysis: Secondary | ICD-10-CM | POA: Diagnosis not present

## 2016-12-13 DIAGNOSIS — Z4932 Encounter for adequacy testing for peritoneal dialysis: Secondary | ICD-10-CM | POA: Diagnosis not present

## 2016-12-13 DIAGNOSIS — Z79899 Other long term (current) drug therapy: Secondary | ICD-10-CM | POA: Diagnosis not present

## 2016-12-13 DIAGNOSIS — D631 Anemia in chronic kidney disease: Secondary | ICD-10-CM | POA: Diagnosis not present

## 2016-12-13 DIAGNOSIS — N2581 Secondary hyperparathyroidism of renal origin: Secondary | ICD-10-CM | POA: Diagnosis not present

## 2016-12-13 DIAGNOSIS — N186 End stage renal disease: Secondary | ICD-10-CM | POA: Diagnosis not present

## 2016-12-13 DIAGNOSIS — D509 Iron deficiency anemia, unspecified: Secondary | ICD-10-CM | POA: Diagnosis not present

## 2016-12-14 DIAGNOSIS — D509 Iron deficiency anemia, unspecified: Secondary | ICD-10-CM | POA: Diagnosis not present

## 2016-12-14 DIAGNOSIS — Z4932 Encounter for adequacy testing for peritoneal dialysis: Secondary | ICD-10-CM | POA: Diagnosis not present

## 2016-12-14 DIAGNOSIS — D631 Anemia in chronic kidney disease: Secondary | ICD-10-CM | POA: Diagnosis not present

## 2016-12-14 DIAGNOSIS — Z79899 Other long term (current) drug therapy: Secondary | ICD-10-CM | POA: Diagnosis not present

## 2016-12-14 DIAGNOSIS — N2581 Secondary hyperparathyroidism of renal origin: Secondary | ICD-10-CM | POA: Diagnosis not present

## 2016-12-14 DIAGNOSIS — N186 End stage renal disease: Secondary | ICD-10-CM | POA: Diagnosis not present

## 2016-12-15 DIAGNOSIS — N2581 Secondary hyperparathyroidism of renal origin: Secondary | ICD-10-CM | POA: Diagnosis not present

## 2016-12-15 DIAGNOSIS — Z79899 Other long term (current) drug therapy: Secondary | ICD-10-CM | POA: Diagnosis not present

## 2016-12-15 DIAGNOSIS — N186 End stage renal disease: Secondary | ICD-10-CM | POA: Diagnosis not present

## 2016-12-15 DIAGNOSIS — Z4932 Encounter for adequacy testing for peritoneal dialysis: Secondary | ICD-10-CM | POA: Diagnosis not present

## 2016-12-15 DIAGNOSIS — D509 Iron deficiency anemia, unspecified: Secondary | ICD-10-CM | POA: Diagnosis not present

## 2016-12-15 DIAGNOSIS — D631 Anemia in chronic kidney disease: Secondary | ICD-10-CM | POA: Diagnosis not present

## 2016-12-16 DIAGNOSIS — Z79899 Other long term (current) drug therapy: Secondary | ICD-10-CM | POA: Diagnosis not present

## 2016-12-16 DIAGNOSIS — N186 End stage renal disease: Secondary | ICD-10-CM | POA: Diagnosis not present

## 2016-12-16 DIAGNOSIS — Z4932 Encounter for adequacy testing for peritoneal dialysis: Secondary | ICD-10-CM | POA: Diagnosis not present

## 2016-12-16 DIAGNOSIS — D631 Anemia in chronic kidney disease: Secondary | ICD-10-CM | POA: Diagnosis not present

## 2016-12-16 DIAGNOSIS — D509 Iron deficiency anemia, unspecified: Secondary | ICD-10-CM | POA: Diagnosis not present

## 2016-12-16 DIAGNOSIS — N2581 Secondary hyperparathyroidism of renal origin: Secondary | ICD-10-CM | POA: Diagnosis not present

## 2016-12-17 DIAGNOSIS — Z79899 Other long term (current) drug therapy: Secondary | ICD-10-CM | POA: Diagnosis not present

## 2016-12-17 DIAGNOSIS — D509 Iron deficiency anemia, unspecified: Secondary | ICD-10-CM | POA: Diagnosis not present

## 2016-12-17 DIAGNOSIS — Z4932 Encounter for adequacy testing for peritoneal dialysis: Secondary | ICD-10-CM | POA: Diagnosis not present

## 2016-12-17 DIAGNOSIS — D631 Anemia in chronic kidney disease: Secondary | ICD-10-CM | POA: Diagnosis not present

## 2016-12-17 DIAGNOSIS — N2581 Secondary hyperparathyroidism of renal origin: Secondary | ICD-10-CM | POA: Diagnosis not present

## 2016-12-17 DIAGNOSIS — N186 End stage renal disease: Secondary | ICD-10-CM | POA: Diagnosis not present

## 2016-12-18 DIAGNOSIS — D631 Anemia in chronic kidney disease: Secondary | ICD-10-CM | POA: Diagnosis not present

## 2016-12-18 DIAGNOSIS — D509 Iron deficiency anemia, unspecified: Secondary | ICD-10-CM | POA: Diagnosis not present

## 2016-12-18 DIAGNOSIS — N2581 Secondary hyperparathyroidism of renal origin: Secondary | ICD-10-CM | POA: Diagnosis not present

## 2016-12-18 DIAGNOSIS — Z79899 Other long term (current) drug therapy: Secondary | ICD-10-CM | POA: Diagnosis not present

## 2016-12-18 DIAGNOSIS — N186 End stage renal disease: Secondary | ICD-10-CM | POA: Diagnosis not present

## 2016-12-18 DIAGNOSIS — Z4932 Encounter for adequacy testing for peritoneal dialysis: Secondary | ICD-10-CM | POA: Diagnosis not present

## 2016-12-19 DIAGNOSIS — D509 Iron deficiency anemia, unspecified: Secondary | ICD-10-CM | POA: Diagnosis not present

## 2016-12-19 DIAGNOSIS — N2581 Secondary hyperparathyroidism of renal origin: Secondary | ICD-10-CM | POA: Diagnosis not present

## 2016-12-19 DIAGNOSIS — Z79899 Other long term (current) drug therapy: Secondary | ICD-10-CM | POA: Diagnosis not present

## 2016-12-19 DIAGNOSIS — D631 Anemia in chronic kidney disease: Secondary | ICD-10-CM | POA: Diagnosis not present

## 2016-12-19 DIAGNOSIS — Z4932 Encounter for adequacy testing for peritoneal dialysis: Secondary | ICD-10-CM | POA: Diagnosis not present

## 2016-12-19 DIAGNOSIS — N186 End stage renal disease: Secondary | ICD-10-CM | POA: Diagnosis not present

## 2016-12-20 DIAGNOSIS — N2581 Secondary hyperparathyroidism of renal origin: Secondary | ICD-10-CM | POA: Diagnosis not present

## 2016-12-20 DIAGNOSIS — Z79899 Other long term (current) drug therapy: Secondary | ICD-10-CM | POA: Diagnosis not present

## 2016-12-20 DIAGNOSIS — D509 Iron deficiency anemia, unspecified: Secondary | ICD-10-CM | POA: Diagnosis not present

## 2016-12-20 DIAGNOSIS — D631 Anemia in chronic kidney disease: Secondary | ICD-10-CM | POA: Diagnosis not present

## 2016-12-20 DIAGNOSIS — N186 End stage renal disease: Secondary | ICD-10-CM | POA: Diagnosis not present

## 2016-12-20 DIAGNOSIS — Z4932 Encounter for adequacy testing for peritoneal dialysis: Secondary | ICD-10-CM | POA: Diagnosis not present

## 2016-12-21 DIAGNOSIS — N186 End stage renal disease: Secondary | ICD-10-CM | POA: Diagnosis not present

## 2016-12-21 DIAGNOSIS — D631 Anemia in chronic kidney disease: Secondary | ICD-10-CM | POA: Diagnosis not present

## 2016-12-21 DIAGNOSIS — N2581 Secondary hyperparathyroidism of renal origin: Secondary | ICD-10-CM | POA: Diagnosis not present

## 2016-12-21 DIAGNOSIS — Z4932 Encounter for adequacy testing for peritoneal dialysis: Secondary | ICD-10-CM | POA: Diagnosis not present

## 2016-12-21 DIAGNOSIS — D509 Iron deficiency anemia, unspecified: Secondary | ICD-10-CM | POA: Diagnosis not present

## 2016-12-21 DIAGNOSIS — Z79899 Other long term (current) drug therapy: Secondary | ICD-10-CM | POA: Diagnosis not present

## 2016-12-22 DIAGNOSIS — D509 Iron deficiency anemia, unspecified: Secondary | ICD-10-CM | POA: Diagnosis not present

## 2016-12-22 DIAGNOSIS — Z4932 Encounter for adequacy testing for peritoneal dialysis: Secondary | ICD-10-CM | POA: Diagnosis not present

## 2016-12-22 DIAGNOSIS — N2581 Secondary hyperparathyroidism of renal origin: Secondary | ICD-10-CM | POA: Diagnosis not present

## 2016-12-22 DIAGNOSIS — D631 Anemia in chronic kidney disease: Secondary | ICD-10-CM | POA: Diagnosis not present

## 2016-12-22 DIAGNOSIS — Z79899 Other long term (current) drug therapy: Secondary | ICD-10-CM | POA: Diagnosis not present

## 2016-12-22 DIAGNOSIS — N186 End stage renal disease: Secondary | ICD-10-CM | POA: Diagnosis not present

## 2016-12-23 DIAGNOSIS — Z79899 Other long term (current) drug therapy: Secondary | ICD-10-CM | POA: Diagnosis not present

## 2016-12-23 DIAGNOSIS — N186 End stage renal disease: Secondary | ICD-10-CM | POA: Diagnosis not present

## 2016-12-23 DIAGNOSIS — N2581 Secondary hyperparathyroidism of renal origin: Secondary | ICD-10-CM | POA: Diagnosis not present

## 2016-12-23 DIAGNOSIS — Z4932 Encounter for adequacy testing for peritoneal dialysis: Secondary | ICD-10-CM | POA: Diagnosis not present

## 2016-12-23 DIAGNOSIS — D631 Anemia in chronic kidney disease: Secondary | ICD-10-CM | POA: Diagnosis not present

## 2016-12-23 DIAGNOSIS — D509 Iron deficiency anemia, unspecified: Secondary | ICD-10-CM | POA: Diagnosis not present

## 2016-12-24 DIAGNOSIS — N186 End stage renal disease: Secondary | ICD-10-CM | POA: Diagnosis not present

## 2016-12-24 DIAGNOSIS — Z4932 Encounter for adequacy testing for peritoneal dialysis: Secondary | ICD-10-CM | POA: Diagnosis not present

## 2016-12-24 DIAGNOSIS — D631 Anemia in chronic kidney disease: Secondary | ICD-10-CM | POA: Diagnosis not present

## 2016-12-24 DIAGNOSIS — D509 Iron deficiency anemia, unspecified: Secondary | ICD-10-CM | POA: Diagnosis not present

## 2016-12-24 DIAGNOSIS — Z79899 Other long term (current) drug therapy: Secondary | ICD-10-CM | POA: Diagnosis not present

## 2016-12-24 DIAGNOSIS — N2581 Secondary hyperparathyroidism of renal origin: Secondary | ICD-10-CM | POA: Diagnosis not present

## 2016-12-25 DIAGNOSIS — N186 End stage renal disease: Secondary | ICD-10-CM | POA: Diagnosis not present

## 2016-12-25 DIAGNOSIS — N2581 Secondary hyperparathyroidism of renal origin: Secondary | ICD-10-CM | POA: Diagnosis not present

## 2016-12-25 DIAGNOSIS — D509 Iron deficiency anemia, unspecified: Secondary | ICD-10-CM | POA: Diagnosis not present

## 2016-12-25 DIAGNOSIS — D631 Anemia in chronic kidney disease: Secondary | ICD-10-CM | POA: Diagnosis not present

## 2016-12-25 DIAGNOSIS — Z4932 Encounter for adequacy testing for peritoneal dialysis: Secondary | ICD-10-CM | POA: Diagnosis not present

## 2016-12-25 DIAGNOSIS — Z79899 Other long term (current) drug therapy: Secondary | ICD-10-CM | POA: Diagnosis not present

## 2016-12-26 DIAGNOSIS — N186 End stage renal disease: Secondary | ICD-10-CM | POA: Diagnosis not present

## 2016-12-26 DIAGNOSIS — Z4932 Encounter for adequacy testing for peritoneal dialysis: Secondary | ICD-10-CM | POA: Diagnosis not present

## 2016-12-26 DIAGNOSIS — D631 Anemia in chronic kidney disease: Secondary | ICD-10-CM | POA: Diagnosis not present

## 2016-12-26 DIAGNOSIS — D509 Iron deficiency anemia, unspecified: Secondary | ICD-10-CM | POA: Diagnosis not present

## 2016-12-26 DIAGNOSIS — Z79899 Other long term (current) drug therapy: Secondary | ICD-10-CM | POA: Diagnosis not present

## 2016-12-26 DIAGNOSIS — N2581 Secondary hyperparathyroidism of renal origin: Secondary | ICD-10-CM | POA: Diagnosis not present

## 2016-12-27 DIAGNOSIS — D631 Anemia in chronic kidney disease: Secondary | ICD-10-CM | POA: Diagnosis not present

## 2016-12-27 DIAGNOSIS — N2581 Secondary hyperparathyroidism of renal origin: Secondary | ICD-10-CM | POA: Diagnosis not present

## 2016-12-27 DIAGNOSIS — N186 End stage renal disease: Secondary | ICD-10-CM | POA: Diagnosis not present

## 2016-12-27 DIAGNOSIS — Z79899 Other long term (current) drug therapy: Secondary | ICD-10-CM | POA: Diagnosis not present

## 2016-12-27 DIAGNOSIS — D509 Iron deficiency anemia, unspecified: Secondary | ICD-10-CM | POA: Diagnosis not present

## 2016-12-27 DIAGNOSIS — Z4932 Encounter for adequacy testing for peritoneal dialysis: Secondary | ICD-10-CM | POA: Diagnosis not present

## 2016-12-28 DIAGNOSIS — D509 Iron deficiency anemia, unspecified: Secondary | ICD-10-CM | POA: Diagnosis not present

## 2016-12-28 DIAGNOSIS — N2581 Secondary hyperparathyroidism of renal origin: Secondary | ICD-10-CM | POA: Diagnosis not present

## 2016-12-28 DIAGNOSIS — Z79899 Other long term (current) drug therapy: Secondary | ICD-10-CM | POA: Diagnosis not present

## 2016-12-28 DIAGNOSIS — D631 Anemia in chronic kidney disease: Secondary | ICD-10-CM | POA: Diagnosis not present

## 2016-12-28 DIAGNOSIS — Z4932 Encounter for adequacy testing for peritoneal dialysis: Secondary | ICD-10-CM | POA: Diagnosis not present

## 2016-12-28 DIAGNOSIS — N186 End stage renal disease: Secondary | ICD-10-CM | POA: Diagnosis not present

## 2016-12-29 DIAGNOSIS — N186 End stage renal disease: Secondary | ICD-10-CM | POA: Diagnosis not present

## 2016-12-29 DIAGNOSIS — D509 Iron deficiency anemia, unspecified: Secondary | ICD-10-CM | POA: Diagnosis not present

## 2016-12-29 DIAGNOSIS — D631 Anemia in chronic kidney disease: Secondary | ICD-10-CM | POA: Diagnosis not present

## 2016-12-29 DIAGNOSIS — N2581 Secondary hyperparathyroidism of renal origin: Secondary | ICD-10-CM | POA: Diagnosis not present

## 2016-12-29 DIAGNOSIS — Z4932 Encounter for adequacy testing for peritoneal dialysis: Secondary | ICD-10-CM | POA: Diagnosis not present

## 2016-12-29 DIAGNOSIS — Z79899 Other long term (current) drug therapy: Secondary | ICD-10-CM | POA: Diagnosis not present

## 2016-12-30 DIAGNOSIS — Z79899 Other long term (current) drug therapy: Secondary | ICD-10-CM | POA: Diagnosis not present

## 2016-12-30 DIAGNOSIS — D631 Anemia in chronic kidney disease: Secondary | ICD-10-CM | POA: Diagnosis not present

## 2016-12-30 DIAGNOSIS — N2581 Secondary hyperparathyroidism of renal origin: Secondary | ICD-10-CM | POA: Diagnosis not present

## 2016-12-30 DIAGNOSIS — D509 Iron deficiency anemia, unspecified: Secondary | ICD-10-CM | POA: Diagnosis not present

## 2016-12-30 DIAGNOSIS — N186 End stage renal disease: Secondary | ICD-10-CM | POA: Diagnosis not present

## 2016-12-30 DIAGNOSIS — Z4932 Encounter for adequacy testing for peritoneal dialysis: Secondary | ICD-10-CM | POA: Diagnosis not present

## 2016-12-31 DIAGNOSIS — D631 Anemia in chronic kidney disease: Secondary | ICD-10-CM | POA: Diagnosis not present

## 2016-12-31 DIAGNOSIS — Z4932 Encounter for adequacy testing for peritoneal dialysis: Secondary | ICD-10-CM | POA: Diagnosis not present

## 2016-12-31 DIAGNOSIS — N2581 Secondary hyperparathyroidism of renal origin: Secondary | ICD-10-CM | POA: Diagnosis not present

## 2016-12-31 DIAGNOSIS — D509 Iron deficiency anemia, unspecified: Secondary | ICD-10-CM | POA: Diagnosis not present

## 2016-12-31 DIAGNOSIS — N186 End stage renal disease: Secondary | ICD-10-CM | POA: Diagnosis not present

## 2016-12-31 DIAGNOSIS — Z79899 Other long term (current) drug therapy: Secondary | ICD-10-CM | POA: Diagnosis not present

## 2017-01-01 DIAGNOSIS — N186 End stage renal disease: Secondary | ICD-10-CM | POA: Diagnosis not present

## 2017-01-01 DIAGNOSIS — Z4932 Encounter for adequacy testing for peritoneal dialysis: Secondary | ICD-10-CM | POA: Diagnosis not present

## 2017-01-01 DIAGNOSIS — Z79899 Other long term (current) drug therapy: Secondary | ICD-10-CM | POA: Diagnosis not present

## 2017-01-01 DIAGNOSIS — D631 Anemia in chronic kidney disease: Secondary | ICD-10-CM | POA: Diagnosis not present

## 2017-01-01 DIAGNOSIS — N2581 Secondary hyperparathyroidism of renal origin: Secondary | ICD-10-CM | POA: Diagnosis not present

## 2017-01-01 DIAGNOSIS — E1129 Type 2 diabetes mellitus with other diabetic kidney complication: Secondary | ICD-10-CM | POA: Diagnosis not present

## 2017-01-01 DIAGNOSIS — Z992 Dependence on renal dialysis: Secondary | ICD-10-CM | POA: Diagnosis not present

## 2017-01-01 DIAGNOSIS — D509 Iron deficiency anemia, unspecified: Secondary | ICD-10-CM | POA: Diagnosis not present

## 2017-01-02 DIAGNOSIS — N186 End stage renal disease: Secondary | ICD-10-CM | POA: Diagnosis not present

## 2017-01-02 DIAGNOSIS — D631 Anemia in chronic kidney disease: Secondary | ICD-10-CM | POA: Diagnosis not present

## 2017-01-02 DIAGNOSIS — N2589 Other disorders resulting from impaired renal tubular function: Secondary | ICD-10-CM | POA: Diagnosis not present

## 2017-01-02 DIAGNOSIS — D509 Iron deficiency anemia, unspecified: Secondary | ICD-10-CM | POA: Diagnosis not present

## 2017-01-02 DIAGNOSIS — N2581 Secondary hyperparathyroidism of renal origin: Secondary | ICD-10-CM | POA: Diagnosis not present

## 2017-01-02 DIAGNOSIS — D630 Anemia in neoplastic disease: Secondary | ICD-10-CM | POA: Diagnosis not present

## 2017-01-02 DIAGNOSIS — Z4932 Encounter for adequacy testing for peritoneal dialysis: Secondary | ICD-10-CM | POA: Diagnosis not present

## 2017-01-03 DIAGNOSIS — N186 End stage renal disease: Secondary | ICD-10-CM | POA: Diagnosis not present

## 2017-01-03 DIAGNOSIS — N2589 Other disorders resulting from impaired renal tubular function: Secondary | ICD-10-CM | POA: Diagnosis not present

## 2017-01-03 DIAGNOSIS — D631 Anemia in chronic kidney disease: Secondary | ICD-10-CM | POA: Diagnosis not present

## 2017-01-03 DIAGNOSIS — Z4932 Encounter for adequacy testing for peritoneal dialysis: Secondary | ICD-10-CM | POA: Diagnosis not present

## 2017-01-03 DIAGNOSIS — D509 Iron deficiency anemia, unspecified: Secondary | ICD-10-CM | POA: Diagnosis not present

## 2017-01-03 DIAGNOSIS — D630 Anemia in neoplastic disease: Secondary | ICD-10-CM | POA: Diagnosis not present

## 2017-01-04 DIAGNOSIS — Z4932 Encounter for adequacy testing for peritoneal dialysis: Secondary | ICD-10-CM | POA: Diagnosis not present

## 2017-01-04 DIAGNOSIS — D631 Anemia in chronic kidney disease: Secondary | ICD-10-CM | POA: Diagnosis not present

## 2017-01-04 DIAGNOSIS — N186 End stage renal disease: Secondary | ICD-10-CM | POA: Diagnosis not present

## 2017-01-04 DIAGNOSIS — N2589 Other disorders resulting from impaired renal tubular function: Secondary | ICD-10-CM | POA: Diagnosis not present

## 2017-01-04 DIAGNOSIS — D630 Anemia in neoplastic disease: Secondary | ICD-10-CM | POA: Diagnosis not present

## 2017-01-04 DIAGNOSIS — D509 Iron deficiency anemia, unspecified: Secondary | ICD-10-CM | POA: Diagnosis not present

## 2017-01-05 DIAGNOSIS — Z4932 Encounter for adequacy testing for peritoneal dialysis: Secondary | ICD-10-CM | POA: Diagnosis not present

## 2017-01-05 DIAGNOSIS — D630 Anemia in neoplastic disease: Secondary | ICD-10-CM | POA: Diagnosis not present

## 2017-01-05 DIAGNOSIS — D631 Anemia in chronic kidney disease: Secondary | ICD-10-CM | POA: Diagnosis not present

## 2017-01-05 DIAGNOSIS — N2589 Other disorders resulting from impaired renal tubular function: Secondary | ICD-10-CM | POA: Diagnosis not present

## 2017-01-05 DIAGNOSIS — N186 End stage renal disease: Secondary | ICD-10-CM | POA: Diagnosis not present

## 2017-01-05 DIAGNOSIS — D509 Iron deficiency anemia, unspecified: Secondary | ICD-10-CM | POA: Diagnosis not present

## 2017-01-06 DIAGNOSIS — D509 Iron deficiency anemia, unspecified: Secondary | ICD-10-CM | POA: Diagnosis not present

## 2017-01-06 DIAGNOSIS — N2589 Other disorders resulting from impaired renal tubular function: Secondary | ICD-10-CM | POA: Diagnosis not present

## 2017-01-06 DIAGNOSIS — N186 End stage renal disease: Secondary | ICD-10-CM | POA: Diagnosis not present

## 2017-01-06 DIAGNOSIS — D631 Anemia in chronic kidney disease: Secondary | ICD-10-CM | POA: Diagnosis not present

## 2017-01-06 DIAGNOSIS — Z4932 Encounter for adequacy testing for peritoneal dialysis: Secondary | ICD-10-CM | POA: Diagnosis not present

## 2017-01-06 DIAGNOSIS — D630 Anemia in neoplastic disease: Secondary | ICD-10-CM | POA: Diagnosis not present

## 2017-01-07 DIAGNOSIS — E1151 Type 2 diabetes mellitus with diabetic peripheral angiopathy without gangrene: Secondary | ICD-10-CM | POA: Diagnosis not present

## 2017-01-07 DIAGNOSIS — R8299 Other abnormal findings in urine: Secondary | ICD-10-CM | POA: Diagnosis not present

## 2017-01-07 DIAGNOSIS — E784 Other hyperlipidemia: Secondary | ICD-10-CM | POA: Diagnosis not present

## 2017-01-07 DIAGNOSIS — Z4932 Encounter for adequacy testing for peritoneal dialysis: Secondary | ICD-10-CM | POA: Diagnosis not present

## 2017-01-07 DIAGNOSIS — D631 Anemia in chronic kidney disease: Secondary | ICD-10-CM | POA: Diagnosis not present

## 2017-01-07 DIAGNOSIS — D509 Iron deficiency anemia, unspecified: Secondary | ICD-10-CM | POA: Diagnosis not present

## 2017-01-07 DIAGNOSIS — N2589 Other disorders resulting from impaired renal tubular function: Secondary | ICD-10-CM | POA: Diagnosis not present

## 2017-01-07 DIAGNOSIS — D630 Anemia in neoplastic disease: Secondary | ICD-10-CM | POA: Diagnosis not present

## 2017-01-07 DIAGNOSIS — N186 End stage renal disease: Secondary | ICD-10-CM | POA: Diagnosis not present

## 2017-01-08 DIAGNOSIS — Z4932 Encounter for adequacy testing for peritoneal dialysis: Secondary | ICD-10-CM | POA: Diagnosis not present

## 2017-01-08 DIAGNOSIS — D631 Anemia in chronic kidney disease: Secondary | ICD-10-CM | POA: Diagnosis not present

## 2017-01-08 DIAGNOSIS — N186 End stage renal disease: Secondary | ICD-10-CM | POA: Diagnosis not present

## 2017-01-08 DIAGNOSIS — N2589 Other disorders resulting from impaired renal tubular function: Secondary | ICD-10-CM | POA: Diagnosis not present

## 2017-01-08 DIAGNOSIS — D509 Iron deficiency anemia, unspecified: Secondary | ICD-10-CM | POA: Diagnosis not present

## 2017-01-08 DIAGNOSIS — D630 Anemia in neoplastic disease: Secondary | ICD-10-CM | POA: Diagnosis not present

## 2017-01-09 DIAGNOSIS — N186 End stage renal disease: Secondary | ICD-10-CM | POA: Diagnosis not present

## 2017-01-09 DIAGNOSIS — D509 Iron deficiency anemia, unspecified: Secondary | ICD-10-CM | POA: Diagnosis not present

## 2017-01-09 DIAGNOSIS — N2589 Other disorders resulting from impaired renal tubular function: Secondary | ICD-10-CM | POA: Diagnosis not present

## 2017-01-09 DIAGNOSIS — Z4932 Encounter for adequacy testing for peritoneal dialysis: Secondary | ICD-10-CM | POA: Diagnosis not present

## 2017-01-09 DIAGNOSIS — D630 Anemia in neoplastic disease: Secondary | ICD-10-CM | POA: Diagnosis not present

## 2017-01-09 DIAGNOSIS — D631 Anemia in chronic kidney disease: Secondary | ICD-10-CM | POA: Diagnosis not present

## 2017-01-10 DIAGNOSIS — D630 Anemia in neoplastic disease: Secondary | ICD-10-CM | POA: Diagnosis not present

## 2017-01-10 DIAGNOSIS — N2589 Other disorders resulting from impaired renal tubular function: Secondary | ICD-10-CM | POA: Diagnosis not present

## 2017-01-10 DIAGNOSIS — D509 Iron deficiency anemia, unspecified: Secondary | ICD-10-CM | POA: Diagnosis not present

## 2017-01-10 DIAGNOSIS — N186 End stage renal disease: Secondary | ICD-10-CM | POA: Diagnosis not present

## 2017-01-10 DIAGNOSIS — D631 Anemia in chronic kidney disease: Secondary | ICD-10-CM | POA: Diagnosis not present

## 2017-01-10 DIAGNOSIS — Z4932 Encounter for adequacy testing for peritoneal dialysis: Secondary | ICD-10-CM | POA: Diagnosis not present

## 2017-01-11 DIAGNOSIS — D509 Iron deficiency anemia, unspecified: Secondary | ICD-10-CM | POA: Diagnosis not present

## 2017-01-11 DIAGNOSIS — D631 Anemia in chronic kidney disease: Secondary | ICD-10-CM | POA: Diagnosis not present

## 2017-01-11 DIAGNOSIS — Z4932 Encounter for adequacy testing for peritoneal dialysis: Secondary | ICD-10-CM | POA: Diagnosis not present

## 2017-01-11 DIAGNOSIS — N2589 Other disorders resulting from impaired renal tubular function: Secondary | ICD-10-CM | POA: Diagnosis not present

## 2017-01-11 DIAGNOSIS — N186 End stage renal disease: Secondary | ICD-10-CM | POA: Diagnosis not present

## 2017-01-11 DIAGNOSIS — D630 Anemia in neoplastic disease: Secondary | ICD-10-CM | POA: Diagnosis not present

## 2017-01-12 DIAGNOSIS — N186 End stage renal disease: Secondary | ICD-10-CM | POA: Diagnosis not present

## 2017-01-12 DIAGNOSIS — D509 Iron deficiency anemia, unspecified: Secondary | ICD-10-CM | POA: Diagnosis not present

## 2017-01-12 DIAGNOSIS — N2589 Other disorders resulting from impaired renal tubular function: Secondary | ICD-10-CM | POA: Diagnosis not present

## 2017-01-12 DIAGNOSIS — D630 Anemia in neoplastic disease: Secondary | ICD-10-CM | POA: Diagnosis not present

## 2017-01-12 DIAGNOSIS — D631 Anemia in chronic kidney disease: Secondary | ICD-10-CM | POA: Diagnosis not present

## 2017-01-12 DIAGNOSIS — Z4932 Encounter for adequacy testing for peritoneal dialysis: Secondary | ICD-10-CM | POA: Diagnosis not present

## 2017-01-12 NOTE — Progress Notes (Signed)
[place] at [facility] [address] [place], [place] [address] [phone]  Date:  01/13/2017   Name:  Bryan Wilkerson   DOB:  11/21/1965   MRN:  [id number]  PCP:  [name], MD    Chief Complaint: Establish Care (Pt here to est care )   History of Present Illness:  Bryan Wilkerson is a 51 y.o. very pleasant male patient who presents with the following:  Here today to establish with primary care He was admitted in May of this year as follows;  Admit date: 11/22/2016 Discharge date: 11/27/2016  Brief/Interim Summary: 51 year old male with history of ESRD on peritoneal dialysis, status post left radiocephalic AV fistula placement on 07/20/2016, diabetes mellitus, hypertension, anemia who initially came to [facility] for right leg cramping and diffuse shaking of the body. Patient denies loss of consciousness. Patient was discharged from the ED and then came back to [facility] with similar complaints. Neurology was consulted by the ED physician and patient was started on Depakote and EEG MRI was ordered. EEG and MRI was not significant.   Discharge Diagnoses:  Active Problems:   CHF (congestive heart failure), NYHA class II (HCC)   Essential hypertension, benign   ESRD on dialysis ([place])   Observed seizure-like activity (HCC)   DM (diabetes mellitus), type 2 with complications ([place])   Seizure ([place])   Hypocalcemia   Seizure-like activity versus muscle spasm-patient has been started on Depakote, EEG and MRI ordered, not significant for any acute pathology.  depakote changed to oral today. Neurology consulted and recommendations given. Currently denies any complaints today, but at the same time he hasn't ambulated. So PT consulted. No further recommendations.  So far no more episodes of seizure like activity.   Hypocalcemia-not significant enough to cause muscle spasms. Monitor calcium level.   Hypokalemia:  repleted. Repeat level good.   ESRD on peritoneal dialysis-further recommendations as per nephrology. Diabetes mellitus- CBG (last 3)  11/24/16 0417 11/24/16 0745 11/24/16 1207  GLUCAP 168* 72 97   Recommend to continue with SSI.  No further changes.   Hypertension- hypotensive episodes, holding hydralazine . Decreased the dose of coreg from 12.5 to 6.25 mg BID.   Elevated troponin- probably from ESRD, doesn't appear to be demand ischemia.  No chest pain or sob.  Mild Hyponatremia;  From esrd. Asymptomatic.  Pt had a h/o  Diastolic CHF in the past but now his EF is normal and there is no mention of the diastolic function in the last echo in 09/2015. Not in exacerbation at this time.   Since his hospital admission he has been feeling pretty ok.  He does week weak and tired some of the time but this is not unusual for him Here today with his wife Bryan Wilkerson  He does his dialysis at home- he uses a peritoneal machine. He is supposed to do this about 12 hours of the day.  He uses it at night Nephrology- Dr. Justin Wilkerson  He sees Dr. Chalmers Wilkerson for his DM.  He was dx 19 years ago.   He started on dialysis 2 years ago.  He has not seen neurology for follow-up as of yet.   He finished taking Depakote and did not refill it- he did not have any refills.    He has psoriasis- he sees dermatology, they have him on Stellara.  He had been on humira but had to stop due to his kidneys  No  fever.  He hs noted some cough for about 3 weeks. The cough is dry.    Lab Results  Component Value Date   HGBA1C 11.9 (H) 11/23/2016     Patient Active Problem List   Diagnosis Date Noted  . Diabetes mellitus due to underlying condition, uncontrolled, with stage 4 chronic kidney disease, with long-term current use of insulin ([place]) 01/13/2017  . Seizure ([place]) 11/23/2016  . Hypocalcemia 11/23/2016  . Observed seizure-like activity ([place]) 11/22/2016  . ESRD on dialysis ([place]) 11/16/2013  . Pre-operative  cardiovascular examination 11/12/2013  . Dyspnea 10/31/2013  . CHF (congestive heart failure), NYHA class II ([place]) 12/29/2012  . Essential hypertension, benign 12/29/2012  . Hyperkalemia 12/29/2012  . Anemia 12/29/2012    Past Medical History:  Diagnosis Date  . Anemia   . CHF (congestive heart failure) ([place])   . Diabetic retinopathy ([place])   . ESRD on peritoneal dialysis ([place])    "7 days/week" (11/23/2016)  . Hypertension   . Pneumonia 2016  . Psoriasis   . Type I diabetes mellitus ([place])     Past Surgical History:  Procedure Laterality Date  . AV FISTULA PLACEMENT Right 12/05/2013   Procedure: RADIOCEPHALIC VS. BRACHIOCEPHALIC ARTERIOVENOUS (AV) FISTULA CREATION;  Surgeon: Conrad Conrad, MD;  Location: [place];  Service: Vascular;  Laterality: Right;  . AV FISTULA PLACEMENT Left 07/20/2016   Procedure: LEFT ARM RADIOCEPHALIC ARTERIOVENOUS (AV) FISTULA CREATION;  Surgeon: Waynetta Sandy, MD;  Location: [place];  Service: Vascular;  Laterality: Left;  . CATARACT EXTRACTION W/ INTRAOCULAR LENS  IMPLANT, BILATERAL Bilateral   . EYE SURGERY    . FISTULOGRAM Right 07/16/2014   Procedure: FISTULOGRAM;  Surgeon: Conrad [place], MD;  Location: [place];  Service: Vascular;  Laterality: Right;  . LIGATION OF COMPETING BRANCHES OF ARTERIOVENOUS FISTULA Right 07/16/2014   Procedure: LIGATION OF COMPETING BRANCHES OF ARTERIOVENOUS FISTULA;  Surgeon: Conrad [place], MD;  Location: [place];  Service: Vascular;  Laterality: Right;  . PERITONEAL CATHETER INSERTION Left ~ 08/2016  . PORTA CATH INSERTION Right 2017   "for hemodialysis"  . RETINAL DETACHMENT SURGERY Right     Social History  Substance Use Topics  . Smoking status: Never Smoker  . Smokeless tobacco: Never Used  . Alcohol use No    Family History  Problem Relation Age of Onset  . Hypertension Mother   . Heart attack Mother   . Chronic Renal Failure Neg Hx   . Diabetes Neg Hx   . Stroke Neg Hx   . Cancer Neg Hx     Allergies   Allergen Reactions  . Penicillins Other (See Comments)    UNSPECIFIED REACTION FROM CHILDHOOD Has patient had a PCN reaction causing immediate rash, facial/tongue/throat swelling, SOB or lightheadedness with hypotension:Yes Has patient had a PCN reaction causing severe rash involving mucus membranes or skin necrosis:No Has patient had a PCN reaction that required hospitalization:Yes Has patient had a PCN reaction occurring within the last 10 years:No If all of the above answers are "NO", then may proceed with Cephalosporin use.      Medication list has been reviewed and updated.  Current Outpatient Prescriptions on File Prior to Visit  Medication Sig Dispense Refill  . calcitRIOL (ROCALTROL) 0.25 MCG capsule Take 1 capsule (0.25 mcg total) by mouth daily. 30 capsule 0  . calcium acetate (PHOSLO) 667 MG capsule Take 4 capsules (2,668 mg total) by mouth 3 (three) times daily with meals. 90 capsule 1  .  carvedilol (COREG) 6.25 MG tablet Take 1 tablet (6.25 mg total) by mouth 2 (two) times daily with a meal. 60 tablet 0  . insulin NPH-regular Human (NOVOLIN 70/30) (70-30) 100 UNIT/ML injection Inject 4 Units into the skin daily.     . multivitamin (RENA-VIT) TABS tablet Take 1 tablet by mouth at bedtime.  0  . STELARA 45 MG/0.5ML SOSY Inject 45 mg into the skin See admin instructions. EVERY 12 WEEKS    . triamcinolone ointment (KENALOG) 0.1 % Apply 1 application topically as needed (psorasis).     [name] divalproex (DEPAKOTE) 500 MG DR tablet Take 1 tablet (500 mg total) by mouth every 8 (eight) hours. (Patient not taking: Reported on 01/13/2017) 90 tablet 0   No current facility-administered medications on file prior to visit.     Review of Systems:  As per HPI- otherwise negative.   Physical Examination: Vitals:   01/13/17 1614  BP: 130/82  Pulse: 75  Temp: 98.2 F (36.8 C)   Vitals:   01/13/17 1614  Weight: 195 lb 6.4 oz (88.6 kg)  Height: 6\' 1"  (1.854 m)   Body mass index is  25.78 kg/m. Ideal Body Weight: Weight in (lb) to have BMI = 25: 189.1  GEN: WDWN, NAD, Non-toxic, A & O x 3, normal weight, looks well HEENT: Atraumatic, Normocephalic. Neck supple. No masses, No LAD.  Bilateral TM wnl, oropharynx normal.  PEERL,EOMI.   Ears and Nose: No external deformity. CV: RRR, No M/G/R. No JVD. No thrill. No extra heart sounds. PULM: CTA B, no wheezes, crackles, rhonchi. No retractions. No resp. distress. No accessory muscle use. ABD: S, NT, ND, +BS. No rebound. No HSM. EXTR: No c/c/e NEURO Normal gait.  PSYCH: Normally interactive. Conversant. Not depressed or anxious appearing.  Calm demeanor.  Discrete patches of psoriasis on his arms    Assessment and Plan: Cough - Plan: DG Chest 2 View  Diabetes mellitus due to underlying condition, uncontrolled, with stage 4 chronic kidney disease, with long-term current use of insulin (HCC)  ESRD on dialysis [facility])  Here today to establish primary care He has endocrinology and nephrology care in place He has noted a bit of a cough and wanted to make sure all is well- obtained CXR today  Dg Chest 2 View  Result Date: 01/13/2017 CLINICAL DATA:  51 y/o  M; cough and lethargy. EXAM: CHEST  2 VIEW COMPARISON:  12/29/2012 chest radiograph. FINDINGS: The heart size and mediastinal contours are within normal limits. Right axillary vascular stent. Right central venous catheter tip projects over right atrium. Both lungs are clear. The visualized skeletal structures are unremarkable. IMPRESSION: No acute pulmonary process identified. Electronically Signed   By: Kristine Garbe M.D.   On: 01/13/2017 17:42   Called pt and LMOM- negative chest x-ray, please let me know if cough persists He plans to see me in about 6 months for a follow-up viist   Signed Lamar Blinks, MD

## 2017-01-13 ENCOUNTER — Ambulatory Visit: Payer: Medicare Other | Admitting: Neurology

## 2017-01-13 ENCOUNTER — Ambulatory Visit (HOSPITAL_BASED_OUTPATIENT_CLINIC_OR_DEPARTMENT_OTHER)
Admission: RE | Admit: 2017-01-13 | Discharge: 2017-01-13 | Disposition: A | Discharge status: Home / Self Care | Payer: 59 | Source: Ambulatory Visit | Attending: Family Medicine | Admitting: Family Medicine

## 2017-01-13 ENCOUNTER — Ambulatory Visit (INDEPENDENT_AMBULATORY_CARE_PROVIDER_SITE_OTHER): Payer: Medicare Other | Admitting: Family Medicine

## 2017-01-13 VITALS — BP 130/82 | HR 75 | Temp 98.2°F | Ht 73.0 in | Wt 195.4 lb

## 2017-01-13 DIAGNOSIS — D509 Iron deficiency anemia, unspecified: Secondary | ICD-10-CM | POA: Diagnosis not present

## 2017-01-13 DIAGNOSIS — R05 Cough: Secondary | ICD-10-CM | POA: Diagnosis not present

## 2017-01-13 DIAGNOSIS — E0822 Diabetes mellitus due to underlying condition with diabetic chronic kidney disease: Secondary | ICD-10-CM

## 2017-01-13 DIAGNOSIS — N2589 Other disorders resulting from impaired renal tubular function: Secondary | ICD-10-CM | POA: Diagnosis not present

## 2017-01-13 DIAGNOSIS — R059 Cough, unspecified: Secondary | ICD-10-CM

## 2017-01-13 DIAGNOSIS — N184 Chronic kidney disease, stage 4 (severe): Secondary | ICD-10-CM | POA: Diagnosis not present

## 2017-01-13 DIAGNOSIS — E0865 Diabetes mellitus due to underlying condition with hyperglycemia: Secondary | ICD-10-CM | POA: Diagnosis not present

## 2017-01-13 DIAGNOSIS — Z992 Dependence on renal dialysis: Secondary | ICD-10-CM

## 2017-01-13 DIAGNOSIS — N186 End stage renal disease: Secondary | ICD-10-CM | POA: Diagnosis not present

## 2017-01-13 DIAGNOSIS — Z4932 Encounter for adequacy testing for peritoneal dialysis: Secondary | ICD-10-CM | POA: Diagnosis not present

## 2017-01-13 DIAGNOSIS — D631 Anemia in chronic kidney disease: Secondary | ICD-10-CM | POA: Diagnosis not present

## 2017-01-13 DIAGNOSIS — Z794 Long term (current) use of insulin: Secondary | ICD-10-CM

## 2017-01-13 DIAGNOSIS — D630 Anemia in neoplastic disease: Secondary | ICD-10-CM | POA: Diagnosis not present

## 2017-01-13 DIAGNOSIS — IMO0002 Reserved for concepts with insufficient information to code with codable children: Secondary | ICD-10-CM | POA: Insufficient documentation

## 2017-01-13 NOTE — Patient Instructions (Signed)
It was very nice to see you today- take care and let me know if you need anything.   We will get a chest x-ray for you today.  Please go to the imaging dep on the ground floor for your picture, and then you can go home.  I will let you know how your -xray looks Unfortunately we do not have neurology at this location  You might consider a home light box at some point for your psoriasis- discuss with your dermatologist.  You would probably want to do light therapy first to see if it helps you however!

## 2017-01-14 DIAGNOSIS — Z4932 Encounter for adequacy testing for peritoneal dialysis: Secondary | ICD-10-CM | POA: Diagnosis not present

## 2017-01-14 DIAGNOSIS — N186 End stage renal disease: Secondary | ICD-10-CM | POA: Diagnosis not present

## 2017-01-14 DIAGNOSIS — D631 Anemia in chronic kidney disease: Secondary | ICD-10-CM | POA: Diagnosis not present

## 2017-01-14 DIAGNOSIS — N2589 Other disorders resulting from impaired renal tubular function: Secondary | ICD-10-CM | POA: Diagnosis not present

## 2017-01-14 DIAGNOSIS — D630 Anemia in neoplastic disease: Secondary | ICD-10-CM | POA: Diagnosis not present

## 2017-01-14 DIAGNOSIS — D509 Iron deficiency anemia, unspecified: Secondary | ICD-10-CM | POA: Diagnosis not present

## 2017-01-15 DIAGNOSIS — N186 End stage renal disease: Secondary | ICD-10-CM | POA: Diagnosis not present

## 2017-01-15 DIAGNOSIS — Z4932 Encounter for adequacy testing for peritoneal dialysis: Secondary | ICD-10-CM | POA: Diagnosis not present

## 2017-01-15 DIAGNOSIS — D631 Anemia in chronic kidney disease: Secondary | ICD-10-CM | POA: Diagnosis not present

## 2017-01-15 DIAGNOSIS — D630 Anemia in neoplastic disease: Secondary | ICD-10-CM | POA: Diagnosis not present

## 2017-01-15 DIAGNOSIS — N2589 Other disorders resulting from impaired renal tubular function: Secondary | ICD-10-CM | POA: Diagnosis not present

## 2017-01-15 DIAGNOSIS — D509 Iron deficiency anemia, unspecified: Secondary | ICD-10-CM | POA: Diagnosis not present

## 2017-01-16 DIAGNOSIS — D509 Iron deficiency anemia, unspecified: Secondary | ICD-10-CM | POA: Diagnosis not present

## 2017-01-16 DIAGNOSIS — Z4932 Encounter for adequacy testing for peritoneal dialysis: Secondary | ICD-10-CM | POA: Diagnosis not present

## 2017-01-16 DIAGNOSIS — D630 Anemia in neoplastic disease: Secondary | ICD-10-CM | POA: Diagnosis not present

## 2017-01-16 DIAGNOSIS — D631 Anemia in chronic kidney disease: Secondary | ICD-10-CM | POA: Diagnosis not present

## 2017-01-16 DIAGNOSIS — N2589 Other disorders resulting from impaired renal tubular function: Secondary | ICD-10-CM | POA: Diagnosis not present

## 2017-01-16 DIAGNOSIS — N186 End stage renal disease: Secondary | ICD-10-CM | POA: Diagnosis not present

## 2017-01-17 DIAGNOSIS — N186 End stage renal disease: Secondary | ICD-10-CM | POA: Diagnosis not present

## 2017-01-17 DIAGNOSIS — D630 Anemia in neoplastic disease: Secondary | ICD-10-CM | POA: Diagnosis not present

## 2017-01-17 DIAGNOSIS — N2589 Other disorders resulting from impaired renal tubular function: Secondary | ICD-10-CM | POA: Diagnosis not present

## 2017-01-17 DIAGNOSIS — Z4932 Encounter for adequacy testing for peritoneal dialysis: Secondary | ICD-10-CM | POA: Diagnosis not present

## 2017-01-17 DIAGNOSIS — D631 Anemia in chronic kidney disease: Secondary | ICD-10-CM | POA: Diagnosis not present

## 2017-01-17 DIAGNOSIS — D509 Iron deficiency anemia, unspecified: Secondary | ICD-10-CM | POA: Diagnosis not present

## 2017-01-18 DIAGNOSIS — Z4932 Encounter for adequacy testing for peritoneal dialysis: Secondary | ICD-10-CM | POA: Diagnosis not present

## 2017-01-18 DIAGNOSIS — N186 End stage renal disease: Secondary | ICD-10-CM | POA: Diagnosis not present

## 2017-01-18 DIAGNOSIS — D631 Anemia in chronic kidney disease: Secondary | ICD-10-CM | POA: Diagnosis not present

## 2017-01-18 DIAGNOSIS — N2589 Other disorders resulting from impaired renal tubular function: Secondary | ICD-10-CM | POA: Diagnosis not present

## 2017-01-18 DIAGNOSIS — D509 Iron deficiency anemia, unspecified: Secondary | ICD-10-CM | POA: Diagnosis not present

## 2017-01-18 DIAGNOSIS — D630 Anemia in neoplastic disease: Secondary | ICD-10-CM | POA: Diagnosis not present

## 2017-01-19 DIAGNOSIS — N186 End stage renal disease: Secondary | ICD-10-CM | POA: Diagnosis not present

## 2017-01-19 DIAGNOSIS — Z4932 Encounter for adequacy testing for peritoneal dialysis: Secondary | ICD-10-CM | POA: Diagnosis not present

## 2017-01-19 DIAGNOSIS — D509 Iron deficiency anemia, unspecified: Secondary | ICD-10-CM | POA: Diagnosis not present

## 2017-01-19 DIAGNOSIS — D630 Anemia in neoplastic disease: Secondary | ICD-10-CM | POA: Diagnosis not present

## 2017-01-19 DIAGNOSIS — D631 Anemia in chronic kidney disease: Secondary | ICD-10-CM | POA: Diagnosis not present

## 2017-01-19 DIAGNOSIS — N2589 Other disorders resulting from impaired renal tubular function: Secondary | ICD-10-CM | POA: Diagnosis not present

## 2017-01-20 DIAGNOSIS — D630 Anemia in neoplastic disease: Secondary | ICD-10-CM | POA: Diagnosis not present

## 2017-01-20 DIAGNOSIS — D509 Iron deficiency anemia, unspecified: Secondary | ICD-10-CM | POA: Diagnosis not present

## 2017-01-20 DIAGNOSIS — N186 End stage renal disease: Secondary | ICD-10-CM | POA: Diagnosis not present

## 2017-01-20 DIAGNOSIS — D631 Anemia in chronic kidney disease: Secondary | ICD-10-CM | POA: Diagnosis not present

## 2017-01-20 DIAGNOSIS — Z4932 Encounter for adequacy testing for peritoneal dialysis: Secondary | ICD-10-CM | POA: Diagnosis not present

## 2017-01-20 DIAGNOSIS — N2589 Other disorders resulting from impaired renal tubular function: Secondary | ICD-10-CM | POA: Diagnosis not present

## 2017-01-21 DIAGNOSIS — N2589 Other disorders resulting from impaired renal tubular function: Secondary | ICD-10-CM | POA: Diagnosis not present

## 2017-01-21 DIAGNOSIS — D630 Anemia in neoplastic disease: Secondary | ICD-10-CM | POA: Diagnosis not present

## 2017-01-21 DIAGNOSIS — N186 End stage renal disease: Secondary | ICD-10-CM | POA: Diagnosis not present

## 2017-01-21 DIAGNOSIS — Z4932 Encounter for adequacy testing for peritoneal dialysis: Secondary | ICD-10-CM | POA: Diagnosis not present

## 2017-01-21 DIAGNOSIS — D631 Anemia in chronic kidney disease: Secondary | ICD-10-CM | POA: Diagnosis not present

## 2017-01-21 DIAGNOSIS — Z452 Encounter for adjustment and management of vascular access device: Secondary | ICD-10-CM | POA: Diagnosis not present

## 2017-01-21 DIAGNOSIS — D509 Iron deficiency anemia, unspecified: Secondary | ICD-10-CM | POA: Diagnosis not present

## 2017-01-22 DIAGNOSIS — D631 Anemia in chronic kidney disease: Secondary | ICD-10-CM | POA: Diagnosis not present

## 2017-01-22 DIAGNOSIS — Z4932 Encounter for adequacy testing for peritoneal dialysis: Secondary | ICD-10-CM | POA: Diagnosis not present

## 2017-01-22 DIAGNOSIS — D509 Iron deficiency anemia, unspecified: Secondary | ICD-10-CM | POA: Diagnosis not present

## 2017-01-22 DIAGNOSIS — N186 End stage renal disease: Secondary | ICD-10-CM | POA: Diagnosis not present

## 2017-01-22 DIAGNOSIS — D630 Anemia in neoplastic disease: Secondary | ICD-10-CM | POA: Diagnosis not present

## 2017-01-22 DIAGNOSIS — N2589 Other disorders resulting from impaired renal tubular function: Secondary | ICD-10-CM | POA: Diagnosis not present

## 2017-01-23 DIAGNOSIS — D509 Iron deficiency anemia, unspecified: Secondary | ICD-10-CM | POA: Diagnosis not present

## 2017-01-23 DIAGNOSIS — Z4932 Encounter for adequacy testing for peritoneal dialysis: Secondary | ICD-10-CM | POA: Diagnosis not present

## 2017-01-23 DIAGNOSIS — N186 End stage renal disease: Secondary | ICD-10-CM | POA: Diagnosis not present

## 2017-01-23 DIAGNOSIS — D631 Anemia in chronic kidney disease: Secondary | ICD-10-CM | POA: Diagnosis not present

## 2017-01-23 DIAGNOSIS — N2589 Other disorders resulting from impaired renal tubular function: Secondary | ICD-10-CM | POA: Diagnosis not present

## 2017-01-23 DIAGNOSIS — D630 Anemia in neoplastic disease: Secondary | ICD-10-CM | POA: Diagnosis not present

## 2017-01-24 DIAGNOSIS — Z4932 Encounter for adequacy testing for peritoneal dialysis: Secondary | ICD-10-CM | POA: Diagnosis not present

## 2017-01-24 DIAGNOSIS — D630 Anemia in neoplastic disease: Secondary | ICD-10-CM | POA: Diagnosis not present

## 2017-01-24 DIAGNOSIS — N2589 Other disorders resulting from impaired renal tubular function: Secondary | ICD-10-CM | POA: Diagnosis not present

## 2017-01-24 DIAGNOSIS — N186 End stage renal disease: Secondary | ICD-10-CM | POA: Diagnosis not present

## 2017-01-24 DIAGNOSIS — D631 Anemia in chronic kidney disease: Secondary | ICD-10-CM | POA: Diagnosis not present

## 2017-01-24 DIAGNOSIS — D509 Iron deficiency anemia, unspecified: Secondary | ICD-10-CM | POA: Diagnosis not present

## 2017-01-25 DIAGNOSIS — D509 Iron deficiency anemia, unspecified: Secondary | ICD-10-CM | POA: Diagnosis not present

## 2017-01-25 DIAGNOSIS — Z4932 Encounter for adequacy testing for peritoneal dialysis: Secondary | ICD-10-CM | POA: Diagnosis not present

## 2017-01-25 DIAGNOSIS — N186 End stage renal disease: Secondary | ICD-10-CM | POA: Diagnosis not present

## 2017-01-25 DIAGNOSIS — N2589 Other disorders resulting from impaired renal tubular function: Secondary | ICD-10-CM | POA: Diagnosis not present

## 2017-01-25 DIAGNOSIS — D631 Anemia in chronic kidney disease: Secondary | ICD-10-CM | POA: Diagnosis not present

## 2017-01-25 DIAGNOSIS — D630 Anemia in neoplastic disease: Secondary | ICD-10-CM | POA: Diagnosis not present

## 2017-01-26 DIAGNOSIS — Z4932 Encounter for adequacy testing for peritoneal dialysis: Secondary | ICD-10-CM | POA: Diagnosis not present

## 2017-01-26 DIAGNOSIS — D509 Iron deficiency anemia, unspecified: Secondary | ICD-10-CM | POA: Diagnosis not present

## 2017-01-26 DIAGNOSIS — D630 Anemia in neoplastic disease: Secondary | ICD-10-CM | POA: Diagnosis not present

## 2017-01-26 DIAGNOSIS — N186 End stage renal disease: Secondary | ICD-10-CM | POA: Diagnosis not present

## 2017-01-26 DIAGNOSIS — D631 Anemia in chronic kidney disease: Secondary | ICD-10-CM | POA: Diagnosis not present

## 2017-01-26 DIAGNOSIS — N2589 Other disorders resulting from impaired renal tubular function: Secondary | ICD-10-CM | POA: Diagnosis not present

## 2017-01-27 DIAGNOSIS — D509 Iron deficiency anemia, unspecified: Secondary | ICD-10-CM | POA: Diagnosis not present

## 2017-01-27 DIAGNOSIS — N186 End stage renal disease: Secondary | ICD-10-CM | POA: Diagnosis not present

## 2017-01-27 DIAGNOSIS — D631 Anemia in chronic kidney disease: Secondary | ICD-10-CM | POA: Diagnosis not present

## 2017-01-27 DIAGNOSIS — Z4932 Encounter for adequacy testing for peritoneal dialysis: Secondary | ICD-10-CM | POA: Diagnosis not present

## 2017-01-27 DIAGNOSIS — N2589 Other disorders resulting from impaired renal tubular function: Secondary | ICD-10-CM | POA: Diagnosis not present

## 2017-01-27 DIAGNOSIS — D630 Anemia in neoplastic disease: Secondary | ICD-10-CM | POA: Diagnosis not present

## 2017-01-28 DIAGNOSIS — N186 End stage renal disease: Secondary | ICD-10-CM | POA: Diagnosis not present

## 2017-01-28 DIAGNOSIS — Z4932 Encounter for adequacy testing for peritoneal dialysis: Secondary | ICD-10-CM | POA: Diagnosis not present

## 2017-01-28 DIAGNOSIS — D509 Iron deficiency anemia, unspecified: Secondary | ICD-10-CM | POA: Diagnosis not present

## 2017-01-28 DIAGNOSIS — D631 Anemia in chronic kidney disease: Secondary | ICD-10-CM | POA: Diagnosis not present

## 2017-01-28 DIAGNOSIS — N2589 Other disorders resulting from impaired renal tubular function: Secondary | ICD-10-CM | POA: Diagnosis not present

## 2017-01-28 DIAGNOSIS — D630 Anemia in neoplastic disease: Secondary | ICD-10-CM | POA: Diagnosis not present

## 2017-01-29 DIAGNOSIS — Z4932 Encounter for adequacy testing for peritoneal dialysis: Secondary | ICD-10-CM | POA: Diagnosis not present

## 2017-01-29 DIAGNOSIS — D630 Anemia in neoplastic disease: Secondary | ICD-10-CM | POA: Diagnosis not present

## 2017-01-29 DIAGNOSIS — N186 End stage renal disease: Secondary | ICD-10-CM | POA: Diagnosis not present

## 2017-01-29 DIAGNOSIS — N2589 Other disorders resulting from impaired renal tubular function: Secondary | ICD-10-CM | POA: Diagnosis not present

## 2017-01-29 DIAGNOSIS — D509 Iron deficiency anemia, unspecified: Secondary | ICD-10-CM | POA: Diagnosis not present

## 2017-01-29 DIAGNOSIS — D631 Anemia in chronic kidney disease: Secondary | ICD-10-CM | POA: Diagnosis not present

## 2017-01-30 DIAGNOSIS — D509 Iron deficiency anemia, unspecified: Secondary | ICD-10-CM | POA: Diagnosis not present

## 2017-01-30 DIAGNOSIS — N2589 Other disorders resulting from impaired renal tubular function: Secondary | ICD-10-CM | POA: Diagnosis not present

## 2017-01-30 DIAGNOSIS — D631 Anemia in chronic kidney disease: Secondary | ICD-10-CM | POA: Diagnosis not present

## 2017-01-30 DIAGNOSIS — D630 Anemia in neoplastic disease: Secondary | ICD-10-CM | POA: Diagnosis not present

## 2017-01-30 DIAGNOSIS — N186 End stage renal disease: Secondary | ICD-10-CM | POA: Diagnosis not present

## 2017-01-30 DIAGNOSIS — Z4932 Encounter for adequacy testing for peritoneal dialysis: Secondary | ICD-10-CM | POA: Diagnosis not present

## 2017-01-31 DIAGNOSIS — N186 End stage renal disease: Secondary | ICD-10-CM | POA: Diagnosis not present

## 2017-01-31 DIAGNOSIS — D630 Anemia in neoplastic disease: Secondary | ICD-10-CM | POA: Diagnosis not present

## 2017-01-31 DIAGNOSIS — D509 Iron deficiency anemia, unspecified: Secondary | ICD-10-CM | POA: Diagnosis not present

## 2017-01-31 DIAGNOSIS — N2589 Other disorders resulting from impaired renal tubular function: Secondary | ICD-10-CM | POA: Diagnosis not present

## 2017-01-31 DIAGNOSIS — Z4932 Encounter for adequacy testing for peritoneal dialysis: Secondary | ICD-10-CM | POA: Diagnosis not present

## 2017-01-31 DIAGNOSIS — D631 Anemia in chronic kidney disease: Secondary | ICD-10-CM | POA: Diagnosis not present

## 2017-02-01 DIAGNOSIS — N2589 Other disorders resulting from impaired renal tubular function: Secondary | ICD-10-CM | POA: Diagnosis not present

## 2017-02-01 DIAGNOSIS — D509 Iron deficiency anemia, unspecified: Secondary | ICD-10-CM | POA: Diagnosis not present

## 2017-02-01 DIAGNOSIS — Z992 Dependence on renal dialysis: Secondary | ICD-10-CM | POA: Diagnosis not present

## 2017-02-01 DIAGNOSIS — D631 Anemia in chronic kidney disease: Secondary | ICD-10-CM | POA: Diagnosis not present

## 2017-02-01 DIAGNOSIS — N186 End stage renal disease: Secondary | ICD-10-CM | POA: Diagnosis not present

## 2017-02-01 DIAGNOSIS — E1129 Type 2 diabetes mellitus with other diabetic kidney complication: Secondary | ICD-10-CM | POA: Diagnosis not present

## 2017-02-01 DIAGNOSIS — Z4932 Encounter for adequacy testing for peritoneal dialysis: Secondary | ICD-10-CM | POA: Diagnosis not present

## 2017-02-01 DIAGNOSIS — D630 Anemia in neoplastic disease: Secondary | ICD-10-CM | POA: Diagnosis not present

## 2017-02-02 DIAGNOSIS — D631 Anemia in chronic kidney disease: Secondary | ICD-10-CM | POA: Diagnosis not present

## 2017-02-02 DIAGNOSIS — N186 End stage renal disease: Secondary | ICD-10-CM | POA: Diagnosis not present

## 2017-02-02 DIAGNOSIS — R17 Unspecified jaundice: Secondary | ICD-10-CM | POA: Diagnosis not present

## 2017-02-02 DIAGNOSIS — D509 Iron deficiency anemia, unspecified: Secondary | ICD-10-CM | POA: Diagnosis not present

## 2017-02-02 DIAGNOSIS — R8299 Other abnormal findings in urine: Secondary | ICD-10-CM | POA: Diagnosis not present

## 2017-02-02 DIAGNOSIS — D630 Anemia in neoplastic disease: Secondary | ICD-10-CM | POA: Diagnosis not present

## 2017-02-02 DIAGNOSIS — Z79899 Other long term (current) drug therapy: Secondary | ICD-10-CM | POA: Diagnosis not present

## 2017-02-02 DIAGNOSIS — E440 Moderate protein-calorie malnutrition: Secondary | ICD-10-CM | POA: Diagnosis not present

## 2017-02-02 DIAGNOSIS — N2581 Secondary hyperparathyroidism of renal origin: Secondary | ICD-10-CM | POA: Diagnosis not present

## 2017-02-02 DIAGNOSIS — Z23 Encounter for immunization: Secondary | ICD-10-CM | POA: Diagnosis not present

## 2017-02-03 DIAGNOSIS — N2581 Secondary hyperparathyroidism of renal origin: Secondary | ICD-10-CM | POA: Diagnosis not present

## 2017-02-03 DIAGNOSIS — E440 Moderate protein-calorie malnutrition: Secondary | ICD-10-CM | POA: Diagnosis not present

## 2017-02-03 DIAGNOSIS — N186 End stage renal disease: Secondary | ICD-10-CM | POA: Diagnosis not present

## 2017-02-03 DIAGNOSIS — Z79899 Other long term (current) drug therapy: Secondary | ICD-10-CM | POA: Diagnosis not present

## 2017-02-03 DIAGNOSIS — D509 Iron deficiency anemia, unspecified: Secondary | ICD-10-CM | POA: Diagnosis not present

## 2017-02-03 DIAGNOSIS — D630 Anemia in neoplastic disease: Secondary | ICD-10-CM | POA: Diagnosis not present

## 2017-02-04 DIAGNOSIS — D509 Iron deficiency anemia, unspecified: Secondary | ICD-10-CM | POA: Diagnosis not present

## 2017-02-04 DIAGNOSIS — N186 End stage renal disease: Secondary | ICD-10-CM | POA: Diagnosis not present

## 2017-02-04 DIAGNOSIS — N2581 Secondary hyperparathyroidism of renal origin: Secondary | ICD-10-CM | POA: Diagnosis not present

## 2017-02-04 DIAGNOSIS — D630 Anemia in neoplastic disease: Secondary | ICD-10-CM | POA: Diagnosis not present

## 2017-02-04 DIAGNOSIS — E440 Moderate protein-calorie malnutrition: Secondary | ICD-10-CM | POA: Diagnosis not present

## 2017-02-04 DIAGNOSIS — Z79899 Other long term (current) drug therapy: Secondary | ICD-10-CM | POA: Diagnosis not present

## 2017-02-05 DIAGNOSIS — N2581 Secondary hyperparathyroidism of renal origin: Secondary | ICD-10-CM | POA: Diagnosis not present

## 2017-02-05 DIAGNOSIS — E440 Moderate protein-calorie malnutrition: Secondary | ICD-10-CM | POA: Diagnosis not present

## 2017-02-05 DIAGNOSIS — Z79899 Other long term (current) drug therapy: Secondary | ICD-10-CM | POA: Diagnosis not present

## 2017-02-05 DIAGNOSIS — N186 End stage renal disease: Secondary | ICD-10-CM | POA: Diagnosis not present

## 2017-02-05 DIAGNOSIS — D630 Anemia in neoplastic disease: Secondary | ICD-10-CM | POA: Diagnosis not present

## 2017-02-05 DIAGNOSIS — D509 Iron deficiency anemia, unspecified: Secondary | ICD-10-CM | POA: Diagnosis not present

## 2017-02-06 DIAGNOSIS — N2581 Secondary hyperparathyroidism of renal origin: Secondary | ICD-10-CM | POA: Diagnosis not present

## 2017-02-06 DIAGNOSIS — D630 Anemia in neoplastic disease: Secondary | ICD-10-CM | POA: Diagnosis not present

## 2017-02-06 DIAGNOSIS — D509 Iron deficiency anemia, unspecified: Secondary | ICD-10-CM | POA: Diagnosis not present

## 2017-02-06 DIAGNOSIS — N186 End stage renal disease: Secondary | ICD-10-CM | POA: Diagnosis not present

## 2017-02-06 DIAGNOSIS — Z79899 Other long term (current) drug therapy: Secondary | ICD-10-CM | POA: Diagnosis not present

## 2017-02-06 DIAGNOSIS — E440 Moderate protein-calorie malnutrition: Secondary | ICD-10-CM | POA: Diagnosis not present

## 2017-02-07 DIAGNOSIS — N186 End stage renal disease: Secondary | ICD-10-CM | POA: Diagnosis not present

## 2017-02-07 DIAGNOSIS — Z79899 Other long term (current) drug therapy: Secondary | ICD-10-CM | POA: Diagnosis not present

## 2017-02-07 DIAGNOSIS — D509 Iron deficiency anemia, unspecified: Secondary | ICD-10-CM | POA: Diagnosis not present

## 2017-02-07 DIAGNOSIS — E440 Moderate protein-calorie malnutrition: Secondary | ICD-10-CM | POA: Diagnosis not present

## 2017-02-07 DIAGNOSIS — N2581 Secondary hyperparathyroidism of renal origin: Secondary | ICD-10-CM | POA: Diagnosis not present

## 2017-02-07 DIAGNOSIS — D630 Anemia in neoplastic disease: Secondary | ICD-10-CM | POA: Diagnosis not present

## 2017-02-08 DIAGNOSIS — N2581 Secondary hyperparathyroidism of renal origin: Secondary | ICD-10-CM | POA: Diagnosis not present

## 2017-02-08 DIAGNOSIS — D630 Anemia in neoplastic disease: Secondary | ICD-10-CM | POA: Diagnosis not present

## 2017-02-08 DIAGNOSIS — N186 End stage renal disease: Secondary | ICD-10-CM | POA: Diagnosis not present

## 2017-02-08 DIAGNOSIS — E440 Moderate protein-calorie malnutrition: Secondary | ICD-10-CM | POA: Diagnosis not present

## 2017-02-08 DIAGNOSIS — D509 Iron deficiency anemia, unspecified: Secondary | ICD-10-CM | POA: Diagnosis not present

## 2017-02-08 DIAGNOSIS — Z79899 Other long term (current) drug therapy: Secondary | ICD-10-CM | POA: Diagnosis not present

## 2017-02-09 DIAGNOSIS — D509 Iron deficiency anemia, unspecified: Secondary | ICD-10-CM | POA: Diagnosis not present

## 2017-02-09 DIAGNOSIS — D630 Anemia in neoplastic disease: Secondary | ICD-10-CM | POA: Diagnosis not present

## 2017-02-09 DIAGNOSIS — Z79899 Other long term (current) drug therapy: Secondary | ICD-10-CM | POA: Diagnosis not present

## 2017-02-09 DIAGNOSIS — N186 End stage renal disease: Secondary | ICD-10-CM | POA: Diagnosis not present

## 2017-02-09 DIAGNOSIS — E440 Moderate protein-calorie malnutrition: Secondary | ICD-10-CM | POA: Diagnosis not present

## 2017-02-09 DIAGNOSIS — N2581 Secondary hyperparathyroidism of renal origin: Secondary | ICD-10-CM | POA: Diagnosis not present

## 2017-02-10 DIAGNOSIS — D509 Iron deficiency anemia, unspecified: Secondary | ICD-10-CM | POA: Diagnosis not present

## 2017-02-10 DIAGNOSIS — D630 Anemia in neoplastic disease: Secondary | ICD-10-CM | POA: Diagnosis not present

## 2017-02-10 DIAGNOSIS — N2581 Secondary hyperparathyroidism of renal origin: Secondary | ICD-10-CM | POA: Diagnosis not present

## 2017-02-10 DIAGNOSIS — Z79899 Other long term (current) drug therapy: Secondary | ICD-10-CM | POA: Diagnosis not present

## 2017-02-10 DIAGNOSIS — E440 Moderate protein-calorie malnutrition: Secondary | ICD-10-CM | POA: Diagnosis not present

## 2017-02-10 DIAGNOSIS — N186 End stage renal disease: Secondary | ICD-10-CM | POA: Diagnosis not present

## 2017-02-11 DIAGNOSIS — N2581 Secondary hyperparathyroidism of renal origin: Secondary | ICD-10-CM | POA: Diagnosis not present

## 2017-02-11 DIAGNOSIS — E440 Moderate protein-calorie malnutrition: Secondary | ICD-10-CM | POA: Diagnosis not present

## 2017-02-11 DIAGNOSIS — D630 Anemia in neoplastic disease: Secondary | ICD-10-CM | POA: Diagnosis not present

## 2017-02-11 DIAGNOSIS — Z79899 Other long term (current) drug therapy: Secondary | ICD-10-CM | POA: Diagnosis not present

## 2017-02-11 DIAGNOSIS — N186 End stage renal disease: Secondary | ICD-10-CM | POA: Diagnosis not present

## 2017-02-11 DIAGNOSIS — D509 Iron deficiency anemia, unspecified: Secondary | ICD-10-CM | POA: Diagnosis not present

## 2017-02-12 DIAGNOSIS — Z79899 Other long term (current) drug therapy: Secondary | ICD-10-CM | POA: Diagnosis not present

## 2017-02-12 DIAGNOSIS — N2581 Secondary hyperparathyroidism of renal origin: Secondary | ICD-10-CM | POA: Diagnosis not present

## 2017-02-12 DIAGNOSIS — D509 Iron deficiency anemia, unspecified: Secondary | ICD-10-CM | POA: Diagnosis not present

## 2017-02-12 DIAGNOSIS — E440 Moderate protein-calorie malnutrition: Secondary | ICD-10-CM | POA: Diagnosis not present

## 2017-02-12 DIAGNOSIS — D630 Anemia in neoplastic disease: Secondary | ICD-10-CM | POA: Diagnosis not present

## 2017-02-12 DIAGNOSIS — N186 End stage renal disease: Secondary | ICD-10-CM | POA: Diagnosis not present

## 2017-02-13 DIAGNOSIS — N186 End stage renal disease: Secondary | ICD-10-CM | POA: Diagnosis not present

## 2017-02-13 DIAGNOSIS — D509 Iron deficiency anemia, unspecified: Secondary | ICD-10-CM | POA: Diagnosis not present

## 2017-02-13 DIAGNOSIS — Z79899 Other long term (current) drug therapy: Secondary | ICD-10-CM | POA: Diagnosis not present

## 2017-02-13 DIAGNOSIS — N2581 Secondary hyperparathyroidism of renal origin: Secondary | ICD-10-CM | POA: Diagnosis not present

## 2017-02-13 DIAGNOSIS — E440 Moderate protein-calorie malnutrition: Secondary | ICD-10-CM | POA: Diagnosis not present

## 2017-02-13 DIAGNOSIS — D630 Anemia in neoplastic disease: Secondary | ICD-10-CM | POA: Diagnosis not present

## 2017-02-14 DIAGNOSIS — D509 Iron deficiency anemia, unspecified: Secondary | ICD-10-CM | POA: Diagnosis not present

## 2017-02-14 DIAGNOSIS — Z79899 Other long term (current) drug therapy: Secondary | ICD-10-CM | POA: Diagnosis not present

## 2017-02-14 DIAGNOSIS — N2581 Secondary hyperparathyroidism of renal origin: Secondary | ICD-10-CM | POA: Diagnosis not present

## 2017-02-14 DIAGNOSIS — D630 Anemia in neoplastic disease: Secondary | ICD-10-CM | POA: Diagnosis not present

## 2017-02-14 DIAGNOSIS — E440 Moderate protein-calorie malnutrition: Secondary | ICD-10-CM | POA: Diagnosis not present

## 2017-02-14 DIAGNOSIS — N186 End stage renal disease: Secondary | ICD-10-CM | POA: Diagnosis not present

## 2017-02-15 DIAGNOSIS — D509 Iron deficiency anemia, unspecified: Secondary | ICD-10-CM | POA: Diagnosis not present

## 2017-02-15 DIAGNOSIS — N186 End stage renal disease: Secondary | ICD-10-CM | POA: Diagnosis not present

## 2017-02-15 DIAGNOSIS — Z79899 Other long term (current) drug therapy: Secondary | ICD-10-CM | POA: Diagnosis not present

## 2017-02-15 DIAGNOSIS — E440 Moderate protein-calorie malnutrition: Secondary | ICD-10-CM | POA: Diagnosis not present

## 2017-02-15 DIAGNOSIS — N2581 Secondary hyperparathyroidism of renal origin: Secondary | ICD-10-CM | POA: Diagnosis not present

## 2017-02-15 DIAGNOSIS — D630 Anemia in neoplastic disease: Secondary | ICD-10-CM | POA: Diagnosis not present

## 2017-02-16 DIAGNOSIS — N2581 Secondary hyperparathyroidism of renal origin: Secondary | ICD-10-CM | POA: Diagnosis not present

## 2017-02-16 DIAGNOSIS — D509 Iron deficiency anemia, unspecified: Secondary | ICD-10-CM | POA: Diagnosis not present

## 2017-02-16 DIAGNOSIS — N186 End stage renal disease: Secondary | ICD-10-CM | POA: Diagnosis not present

## 2017-02-16 DIAGNOSIS — D630 Anemia in neoplastic disease: Secondary | ICD-10-CM | POA: Diagnosis not present

## 2017-02-16 DIAGNOSIS — Z79899 Other long term (current) drug therapy: Secondary | ICD-10-CM | POA: Diagnosis not present

## 2017-02-16 DIAGNOSIS — E440 Moderate protein-calorie malnutrition: Secondary | ICD-10-CM | POA: Diagnosis not present

## 2017-02-17 DIAGNOSIS — N2581 Secondary hyperparathyroidism of renal origin: Secondary | ICD-10-CM | POA: Diagnosis not present

## 2017-02-17 DIAGNOSIS — D509 Iron deficiency anemia, unspecified: Secondary | ICD-10-CM | POA: Diagnosis not present

## 2017-02-17 DIAGNOSIS — E440 Moderate protein-calorie malnutrition: Secondary | ICD-10-CM | POA: Diagnosis not present

## 2017-02-17 DIAGNOSIS — Z79899 Other long term (current) drug therapy: Secondary | ICD-10-CM | POA: Diagnosis not present

## 2017-02-17 DIAGNOSIS — N186 End stage renal disease: Secondary | ICD-10-CM | POA: Diagnosis not present

## 2017-02-17 DIAGNOSIS — D630 Anemia in neoplastic disease: Secondary | ICD-10-CM | POA: Diagnosis not present

## 2017-02-18 DIAGNOSIS — N2581 Secondary hyperparathyroidism of renal origin: Secondary | ICD-10-CM | POA: Diagnosis not present

## 2017-02-18 DIAGNOSIS — E440 Moderate protein-calorie malnutrition: Secondary | ICD-10-CM | POA: Diagnosis not present

## 2017-02-18 DIAGNOSIS — D509 Iron deficiency anemia, unspecified: Secondary | ICD-10-CM | POA: Diagnosis not present

## 2017-02-18 DIAGNOSIS — N186 End stage renal disease: Secondary | ICD-10-CM | POA: Diagnosis not present

## 2017-02-18 DIAGNOSIS — D630 Anemia in neoplastic disease: Secondary | ICD-10-CM | POA: Diagnosis not present

## 2017-02-18 DIAGNOSIS — Z79899 Other long term (current) drug therapy: Secondary | ICD-10-CM | POA: Diagnosis not present

## 2017-02-19 DIAGNOSIS — E440 Moderate protein-calorie malnutrition: Secondary | ICD-10-CM | POA: Diagnosis not present

## 2017-02-19 DIAGNOSIS — D630 Anemia in neoplastic disease: Secondary | ICD-10-CM | POA: Diagnosis not present

## 2017-02-19 DIAGNOSIS — N2581 Secondary hyperparathyroidism of renal origin: Secondary | ICD-10-CM | POA: Diagnosis not present

## 2017-02-19 DIAGNOSIS — D509 Iron deficiency anemia, unspecified: Secondary | ICD-10-CM | POA: Diagnosis not present

## 2017-02-19 DIAGNOSIS — N186 End stage renal disease: Secondary | ICD-10-CM | POA: Diagnosis not present

## 2017-02-19 DIAGNOSIS — Z79899 Other long term (current) drug therapy: Secondary | ICD-10-CM | POA: Diagnosis not present

## 2017-02-20 DIAGNOSIS — Z79899 Other long term (current) drug therapy: Secondary | ICD-10-CM | POA: Diagnosis not present

## 2017-02-20 DIAGNOSIS — N186 End stage renal disease: Secondary | ICD-10-CM | POA: Diagnosis not present

## 2017-02-20 DIAGNOSIS — D630 Anemia in neoplastic disease: Secondary | ICD-10-CM | POA: Diagnosis not present

## 2017-02-20 DIAGNOSIS — N2581 Secondary hyperparathyroidism of renal origin: Secondary | ICD-10-CM | POA: Diagnosis not present

## 2017-02-20 DIAGNOSIS — D509 Iron deficiency anemia, unspecified: Secondary | ICD-10-CM | POA: Diagnosis not present

## 2017-02-20 DIAGNOSIS — E440 Moderate protein-calorie malnutrition: Secondary | ICD-10-CM | POA: Diagnosis not present

## 2017-02-21 DIAGNOSIS — E440 Moderate protein-calorie malnutrition: Secondary | ICD-10-CM | POA: Diagnosis not present

## 2017-02-21 DIAGNOSIS — N186 End stage renal disease: Secondary | ICD-10-CM | POA: Diagnosis not present

## 2017-02-21 DIAGNOSIS — H401134 Primary open-angle glaucoma, bilateral, indeterminate stage: Secondary | ICD-10-CM | POA: Diagnosis not present

## 2017-02-21 DIAGNOSIS — D509 Iron deficiency anemia, unspecified: Secondary | ICD-10-CM | POA: Diagnosis not present

## 2017-02-21 DIAGNOSIS — H524 Presbyopia: Secondary | ICD-10-CM | POA: Diagnosis not present

## 2017-02-21 DIAGNOSIS — E103493 Type 1 diabetes mellitus with severe nonproliferative diabetic retinopathy without macular edema, bilateral: Secondary | ICD-10-CM | POA: Diagnosis not present

## 2017-02-21 DIAGNOSIS — Z79899 Other long term (current) drug therapy: Secondary | ICD-10-CM | POA: Diagnosis not present

## 2017-02-21 DIAGNOSIS — N2581 Secondary hyperparathyroidism of renal origin: Secondary | ICD-10-CM | POA: Diagnosis not present

## 2017-02-21 DIAGNOSIS — H52223 Regular astigmatism, bilateral: Secondary | ICD-10-CM | POA: Diagnosis not present

## 2017-02-21 DIAGNOSIS — D630 Anemia in neoplastic disease: Secondary | ICD-10-CM | POA: Diagnosis not present

## 2017-02-21 DIAGNOSIS — Z961 Presence of intraocular lens: Secondary | ICD-10-CM | POA: Diagnosis not present

## 2017-02-21 DIAGNOSIS — H26493 Other secondary cataract, bilateral: Secondary | ICD-10-CM | POA: Diagnosis not present

## 2017-02-22 ENCOUNTER — Ambulatory Visit: Payer: Medicare Other | Admitting: Neurology

## 2017-02-22 DIAGNOSIS — D509 Iron deficiency anemia, unspecified: Secondary | ICD-10-CM | POA: Diagnosis not present

## 2017-02-22 DIAGNOSIS — Z79899 Other long term (current) drug therapy: Secondary | ICD-10-CM | POA: Diagnosis not present

## 2017-02-22 DIAGNOSIS — E440 Moderate protein-calorie malnutrition: Secondary | ICD-10-CM | POA: Diagnosis not present

## 2017-02-22 DIAGNOSIS — D630 Anemia in neoplastic disease: Secondary | ICD-10-CM | POA: Diagnosis not present

## 2017-02-22 DIAGNOSIS — N2581 Secondary hyperparathyroidism of renal origin: Secondary | ICD-10-CM | POA: Diagnosis not present

## 2017-02-22 DIAGNOSIS — N186 End stage renal disease: Secondary | ICD-10-CM | POA: Diagnosis not present

## 2017-02-23 DIAGNOSIS — N2581 Secondary hyperparathyroidism of renal origin: Secondary | ICD-10-CM | POA: Diagnosis not present

## 2017-02-23 DIAGNOSIS — E440 Moderate protein-calorie malnutrition: Secondary | ICD-10-CM | POA: Diagnosis not present

## 2017-02-23 DIAGNOSIS — N186 End stage renal disease: Secondary | ICD-10-CM | POA: Diagnosis not present

## 2017-02-23 DIAGNOSIS — D509 Iron deficiency anemia, unspecified: Secondary | ICD-10-CM | POA: Diagnosis not present

## 2017-02-23 DIAGNOSIS — D630 Anemia in neoplastic disease: Secondary | ICD-10-CM | POA: Diagnosis not present

## 2017-02-23 DIAGNOSIS — Z79899 Other long term (current) drug therapy: Secondary | ICD-10-CM | POA: Diagnosis not present

## 2017-02-24 DIAGNOSIS — E440 Moderate protein-calorie malnutrition: Secondary | ICD-10-CM | POA: Diagnosis not present

## 2017-02-24 DIAGNOSIS — N186 End stage renal disease: Secondary | ICD-10-CM | POA: Diagnosis not present

## 2017-02-24 DIAGNOSIS — D630 Anemia in neoplastic disease: Secondary | ICD-10-CM | POA: Diagnosis not present

## 2017-02-24 DIAGNOSIS — D509 Iron deficiency anemia, unspecified: Secondary | ICD-10-CM | POA: Diagnosis not present

## 2017-02-24 DIAGNOSIS — Z79899 Other long term (current) drug therapy: Secondary | ICD-10-CM | POA: Diagnosis not present

## 2017-02-24 DIAGNOSIS — N2581 Secondary hyperparathyroidism of renal origin: Secondary | ICD-10-CM | POA: Diagnosis not present

## 2017-02-24 NOTE — Addendum Note (Signed)
Addendum  created 02/24/17 [id number] by Roberts Gaudy, MD   Sign clinical note

## 2017-02-25 DIAGNOSIS — Z79899 Other long term (current) drug therapy: Secondary | ICD-10-CM | POA: Diagnosis not present

## 2017-02-25 DIAGNOSIS — D630 Anemia in neoplastic disease: Secondary | ICD-10-CM | POA: Diagnosis not present

## 2017-02-25 DIAGNOSIS — N186 End stage renal disease: Secondary | ICD-10-CM | POA: Diagnosis not present

## 2017-02-25 DIAGNOSIS — E440 Moderate protein-calorie malnutrition: Secondary | ICD-10-CM | POA: Diagnosis not present

## 2017-02-25 DIAGNOSIS — D509 Iron deficiency anemia, unspecified: Secondary | ICD-10-CM | POA: Diagnosis not present

## 2017-02-25 DIAGNOSIS — N2581 Secondary hyperparathyroidism of renal origin: Secondary | ICD-10-CM | POA: Diagnosis not present

## 2017-02-26 DIAGNOSIS — N2581 Secondary hyperparathyroidism of renal origin: Secondary | ICD-10-CM | POA: Diagnosis not present

## 2017-02-26 DIAGNOSIS — Z79899 Other long term (current) drug therapy: Secondary | ICD-10-CM | POA: Diagnosis not present

## 2017-02-26 DIAGNOSIS — D630 Anemia in neoplastic disease: Secondary | ICD-10-CM | POA: Diagnosis not present

## 2017-02-26 DIAGNOSIS — D509 Iron deficiency anemia, unspecified: Secondary | ICD-10-CM | POA: Diagnosis not present

## 2017-02-26 DIAGNOSIS — E440 Moderate protein-calorie malnutrition: Secondary | ICD-10-CM | POA: Diagnosis not present

## 2017-02-26 DIAGNOSIS — N186 End stage renal disease: Secondary | ICD-10-CM | POA: Diagnosis not present

## 2017-02-27 DIAGNOSIS — D509 Iron deficiency anemia, unspecified: Secondary | ICD-10-CM | POA: Diagnosis not present

## 2017-02-27 DIAGNOSIS — D630 Anemia in neoplastic disease: Secondary | ICD-10-CM | POA: Diagnosis not present

## 2017-02-27 DIAGNOSIS — E440 Moderate protein-calorie malnutrition: Secondary | ICD-10-CM | POA: Diagnosis not present

## 2017-02-27 DIAGNOSIS — Z79899 Other long term (current) drug therapy: Secondary | ICD-10-CM | POA: Diagnosis not present

## 2017-02-27 DIAGNOSIS — N186 End stage renal disease: Secondary | ICD-10-CM | POA: Diagnosis not present

## 2017-02-27 DIAGNOSIS — N2581 Secondary hyperparathyroidism of renal origin: Secondary | ICD-10-CM | POA: Diagnosis not present

## 2017-02-28 DIAGNOSIS — N186 End stage renal disease: Secondary | ICD-10-CM | POA: Diagnosis not present

## 2017-02-28 DIAGNOSIS — D509 Iron deficiency anemia, unspecified: Secondary | ICD-10-CM | POA: Diagnosis not present

## 2017-02-28 DIAGNOSIS — D630 Anemia in neoplastic disease: Secondary | ICD-10-CM | POA: Diagnosis not present

## 2017-02-28 DIAGNOSIS — Z79899 Other long term (current) drug therapy: Secondary | ICD-10-CM | POA: Diagnosis not present

## 2017-02-28 DIAGNOSIS — N2581 Secondary hyperparathyroidism of renal origin: Secondary | ICD-10-CM | POA: Diagnosis not present

## 2017-02-28 DIAGNOSIS — E440 Moderate protein-calorie malnutrition: Secondary | ICD-10-CM | POA: Diagnosis not present

## 2017-03-01 DIAGNOSIS — Z79899 Other long term (current) drug therapy: Secondary | ICD-10-CM | POA: Diagnosis not present

## 2017-03-01 DIAGNOSIS — D509 Iron deficiency anemia, unspecified: Secondary | ICD-10-CM | POA: Diagnosis not present

## 2017-03-01 DIAGNOSIS — N186 End stage renal disease: Secondary | ICD-10-CM | POA: Diagnosis not present

## 2017-03-01 DIAGNOSIS — D630 Anemia in neoplastic disease: Secondary | ICD-10-CM | POA: Diagnosis not present

## 2017-03-01 DIAGNOSIS — N2581 Secondary hyperparathyroidism of renal origin: Secondary | ICD-10-CM | POA: Diagnosis not present

## 2017-03-01 DIAGNOSIS — E440 Moderate protein-calorie malnutrition: Secondary | ICD-10-CM | POA: Diagnosis not present

## 2017-03-02 DIAGNOSIS — E440 Moderate protein-calorie malnutrition: Secondary | ICD-10-CM | POA: Diagnosis not present

## 2017-03-02 DIAGNOSIS — D509 Iron deficiency anemia, unspecified: Secondary | ICD-10-CM | POA: Diagnosis not present

## 2017-03-02 DIAGNOSIS — N186 End stage renal disease: Secondary | ICD-10-CM | POA: Diagnosis not present

## 2017-03-02 DIAGNOSIS — D630 Anemia in neoplastic disease: Secondary | ICD-10-CM | POA: Diagnosis not present

## 2017-03-02 DIAGNOSIS — Z79899 Other long term (current) drug therapy: Secondary | ICD-10-CM | POA: Diagnosis not present

## 2017-03-02 DIAGNOSIS — N2581 Secondary hyperparathyroidism of renal origin: Secondary | ICD-10-CM | POA: Diagnosis not present

## 2017-03-03 DIAGNOSIS — N2581 Secondary hyperparathyroidism of renal origin: Secondary | ICD-10-CM | POA: Diagnosis not present

## 2017-03-03 DIAGNOSIS — E440 Moderate protein-calorie malnutrition: Secondary | ICD-10-CM | POA: Diagnosis not present

## 2017-03-03 DIAGNOSIS — Z79899 Other long term (current) drug therapy: Secondary | ICD-10-CM | POA: Diagnosis not present

## 2017-03-03 DIAGNOSIS — D630 Anemia in neoplastic disease: Secondary | ICD-10-CM | POA: Diagnosis not present

## 2017-03-03 DIAGNOSIS — D509 Iron deficiency anemia, unspecified: Secondary | ICD-10-CM | POA: Diagnosis not present

## 2017-03-03 DIAGNOSIS — N186 End stage renal disease: Secondary | ICD-10-CM | POA: Diagnosis not present

## 2017-03-04 DIAGNOSIS — D509 Iron deficiency anemia, unspecified: Secondary | ICD-10-CM | POA: Diagnosis not present

## 2017-03-04 DIAGNOSIS — Z79899 Other long term (current) drug therapy: Secondary | ICD-10-CM | POA: Diagnosis not present

## 2017-03-04 DIAGNOSIS — N186 End stage renal disease: Secondary | ICD-10-CM | POA: Diagnosis not present

## 2017-03-04 DIAGNOSIS — E440 Moderate protein-calorie malnutrition: Secondary | ICD-10-CM | POA: Diagnosis not present

## 2017-03-04 DIAGNOSIS — Z992 Dependence on renal dialysis: Secondary | ICD-10-CM | POA: Diagnosis not present

## 2017-03-04 DIAGNOSIS — N2581 Secondary hyperparathyroidism of renal origin: Secondary | ICD-10-CM | POA: Diagnosis not present

## 2017-03-04 DIAGNOSIS — D630 Anemia in neoplastic disease: Secondary | ICD-10-CM | POA: Diagnosis not present

## 2017-03-04 DIAGNOSIS — E1129 Type 2 diabetes mellitus with other diabetic kidney complication: Secondary | ICD-10-CM | POA: Diagnosis not present

## 2017-03-05 DIAGNOSIS — E440 Moderate protein-calorie malnutrition: Secondary | ICD-10-CM | POA: Diagnosis not present

## 2017-03-05 DIAGNOSIS — D631 Anemia in chronic kidney disease: Secondary | ICD-10-CM | POA: Diagnosis not present

## 2017-03-05 DIAGNOSIS — Z23 Encounter for immunization: Secondary | ICD-10-CM | POA: Diagnosis not present

## 2017-03-05 DIAGNOSIS — R17 Unspecified jaundice: Secondary | ICD-10-CM | POA: Diagnosis not present

## 2017-03-05 DIAGNOSIS — N186 End stage renal disease: Secondary | ICD-10-CM | POA: Diagnosis not present

## 2017-03-05 DIAGNOSIS — D630 Anemia in neoplastic disease: Secondary | ICD-10-CM | POA: Diagnosis not present

## 2017-03-05 DIAGNOSIS — N2581 Secondary hyperparathyroidism of renal origin: Secondary | ICD-10-CM | POA: Diagnosis not present

## 2017-03-05 DIAGNOSIS — Z79899 Other long term (current) drug therapy: Secondary | ICD-10-CM | POA: Diagnosis not present

## 2017-03-05 DIAGNOSIS — D509 Iron deficiency anemia, unspecified: Secondary | ICD-10-CM | POA: Diagnosis not present

## 2017-03-06 DIAGNOSIS — D509 Iron deficiency anemia, unspecified: Secondary | ICD-10-CM | POA: Diagnosis not present

## 2017-03-06 DIAGNOSIS — N186 End stage renal disease: Secondary | ICD-10-CM | POA: Diagnosis not present

## 2017-03-06 DIAGNOSIS — N2581 Secondary hyperparathyroidism of renal origin: Secondary | ICD-10-CM | POA: Diagnosis not present

## 2017-03-06 DIAGNOSIS — D631 Anemia in chronic kidney disease: Secondary | ICD-10-CM | POA: Diagnosis not present

## 2017-03-06 DIAGNOSIS — D630 Anemia in neoplastic disease: Secondary | ICD-10-CM | POA: Diagnosis not present

## 2017-03-06 DIAGNOSIS — R17 Unspecified jaundice: Secondary | ICD-10-CM | POA: Diagnosis not present

## 2017-03-07 DIAGNOSIS — N186 End stage renal disease: Secondary | ICD-10-CM | POA: Diagnosis not present

## 2017-03-07 DIAGNOSIS — N2581 Secondary hyperparathyroidism of renal origin: Secondary | ICD-10-CM | POA: Diagnosis not present

## 2017-03-07 DIAGNOSIS — R17 Unspecified jaundice: Secondary | ICD-10-CM | POA: Diagnosis not present

## 2017-03-07 DIAGNOSIS — D630 Anemia in neoplastic disease: Secondary | ICD-10-CM | POA: Diagnosis not present

## 2017-03-07 DIAGNOSIS — D631 Anemia in chronic kidney disease: Secondary | ICD-10-CM | POA: Diagnosis not present

## 2017-03-07 DIAGNOSIS — D509 Iron deficiency anemia, unspecified: Secondary | ICD-10-CM | POA: Diagnosis not present

## 2017-03-08 DIAGNOSIS — R17 Unspecified jaundice: Secondary | ICD-10-CM | POA: Diagnosis not present

## 2017-03-08 DIAGNOSIS — N2581 Secondary hyperparathyroidism of renal origin: Secondary | ICD-10-CM | POA: Diagnosis not present

## 2017-03-08 DIAGNOSIS — D509 Iron deficiency anemia, unspecified: Secondary | ICD-10-CM | POA: Diagnosis not present

## 2017-03-08 DIAGNOSIS — D630 Anemia in neoplastic disease: Secondary | ICD-10-CM | POA: Diagnosis not present

## 2017-03-08 DIAGNOSIS — D631 Anemia in chronic kidney disease: Secondary | ICD-10-CM | POA: Diagnosis not present

## 2017-03-08 DIAGNOSIS — N186 End stage renal disease: Secondary | ICD-10-CM | POA: Diagnosis not present

## 2017-03-09 DIAGNOSIS — D631 Anemia in chronic kidney disease: Secondary | ICD-10-CM | POA: Diagnosis not present

## 2017-03-09 DIAGNOSIS — R17 Unspecified jaundice: Secondary | ICD-10-CM | POA: Diagnosis not present

## 2017-03-09 DIAGNOSIS — N186 End stage renal disease: Secondary | ICD-10-CM | POA: Diagnosis not present

## 2017-03-09 DIAGNOSIS — N2581 Secondary hyperparathyroidism of renal origin: Secondary | ICD-10-CM | POA: Diagnosis not present

## 2017-03-09 DIAGNOSIS — D630 Anemia in neoplastic disease: Secondary | ICD-10-CM | POA: Diagnosis not present

## 2017-03-09 DIAGNOSIS — D509 Iron deficiency anemia, unspecified: Secondary | ICD-10-CM | POA: Diagnosis not present

## 2017-03-10 DIAGNOSIS — N2581 Secondary hyperparathyroidism of renal origin: Secondary | ICD-10-CM | POA: Diagnosis not present

## 2017-03-10 DIAGNOSIS — D630 Anemia in neoplastic disease: Secondary | ICD-10-CM | POA: Diagnosis not present

## 2017-03-10 DIAGNOSIS — R17 Unspecified jaundice: Secondary | ICD-10-CM | POA: Diagnosis not present

## 2017-03-10 DIAGNOSIS — D631 Anemia in chronic kidney disease: Secondary | ICD-10-CM | POA: Diagnosis not present

## 2017-03-10 DIAGNOSIS — R8299 Other abnormal findings in urine: Secondary | ICD-10-CM | POA: Diagnosis not present

## 2017-03-10 DIAGNOSIS — D509 Iron deficiency anemia, unspecified: Secondary | ICD-10-CM | POA: Diagnosis not present

## 2017-03-10 DIAGNOSIS — N186 End stage renal disease: Secondary | ICD-10-CM | POA: Diagnosis not present

## 2017-03-11 DIAGNOSIS — D630 Anemia in neoplastic disease: Secondary | ICD-10-CM | POA: Diagnosis not present

## 2017-03-11 DIAGNOSIS — D509 Iron deficiency anemia, unspecified: Secondary | ICD-10-CM | POA: Diagnosis not present

## 2017-03-11 DIAGNOSIS — R17 Unspecified jaundice: Secondary | ICD-10-CM | POA: Diagnosis not present

## 2017-03-11 DIAGNOSIS — N2581 Secondary hyperparathyroidism of renal origin: Secondary | ICD-10-CM | POA: Diagnosis not present

## 2017-03-11 DIAGNOSIS — D631 Anemia in chronic kidney disease: Secondary | ICD-10-CM | POA: Diagnosis not present

## 2017-03-11 DIAGNOSIS — N186 End stage renal disease: Secondary | ICD-10-CM | POA: Diagnosis not present

## 2017-03-12 DIAGNOSIS — N2581 Secondary hyperparathyroidism of renal origin: Secondary | ICD-10-CM | POA: Diagnosis not present

## 2017-03-12 DIAGNOSIS — N186 End stage renal disease: Secondary | ICD-10-CM | POA: Diagnosis not present

## 2017-03-12 DIAGNOSIS — D631 Anemia in chronic kidney disease: Secondary | ICD-10-CM | POA: Diagnosis not present

## 2017-03-12 DIAGNOSIS — D630 Anemia in neoplastic disease: Secondary | ICD-10-CM | POA: Diagnosis not present

## 2017-03-12 DIAGNOSIS — D509 Iron deficiency anemia, unspecified: Secondary | ICD-10-CM | POA: Diagnosis not present

## 2017-03-12 DIAGNOSIS — R17 Unspecified jaundice: Secondary | ICD-10-CM | POA: Diagnosis not present

## 2017-03-13 DIAGNOSIS — R17 Unspecified jaundice: Secondary | ICD-10-CM | POA: Diagnosis not present

## 2017-03-13 DIAGNOSIS — N2581 Secondary hyperparathyroidism of renal origin: Secondary | ICD-10-CM | POA: Diagnosis not present

## 2017-03-13 DIAGNOSIS — N186 End stage renal disease: Secondary | ICD-10-CM | POA: Diagnosis not present

## 2017-03-13 DIAGNOSIS — D630 Anemia in neoplastic disease: Secondary | ICD-10-CM | POA: Diagnosis not present

## 2017-03-13 DIAGNOSIS — D509 Iron deficiency anemia, unspecified: Secondary | ICD-10-CM | POA: Diagnosis not present

## 2017-03-13 DIAGNOSIS — D631 Anemia in chronic kidney disease: Secondary | ICD-10-CM | POA: Diagnosis not present

## 2017-03-14 DIAGNOSIS — R17 Unspecified jaundice: Secondary | ICD-10-CM | POA: Diagnosis not present

## 2017-03-14 DIAGNOSIS — N186 End stage renal disease: Secondary | ICD-10-CM | POA: Diagnosis not present

## 2017-03-14 DIAGNOSIS — D631 Anemia in chronic kidney disease: Secondary | ICD-10-CM | POA: Diagnosis not present

## 2017-03-14 DIAGNOSIS — N2581 Secondary hyperparathyroidism of renal origin: Secondary | ICD-10-CM | POA: Diagnosis not present

## 2017-03-14 DIAGNOSIS — D509 Iron deficiency anemia, unspecified: Secondary | ICD-10-CM | POA: Diagnosis not present

## 2017-03-14 DIAGNOSIS — D630 Anemia in neoplastic disease: Secondary | ICD-10-CM | POA: Diagnosis not present

## 2017-03-15 DIAGNOSIS — N2581 Secondary hyperparathyroidism of renal origin: Secondary | ICD-10-CM | POA: Diagnosis not present

## 2017-03-15 DIAGNOSIS — D509 Iron deficiency anemia, unspecified: Secondary | ICD-10-CM | POA: Diagnosis not present

## 2017-03-15 DIAGNOSIS — N186 End stage renal disease: Secondary | ICD-10-CM | POA: Diagnosis not present

## 2017-03-15 DIAGNOSIS — D631 Anemia in chronic kidney disease: Secondary | ICD-10-CM | POA: Diagnosis not present

## 2017-03-15 DIAGNOSIS — R17 Unspecified jaundice: Secondary | ICD-10-CM | POA: Diagnosis not present

## 2017-03-15 DIAGNOSIS — D630 Anemia in neoplastic disease: Secondary | ICD-10-CM | POA: Diagnosis not present

## 2017-03-16 DIAGNOSIS — D509 Iron deficiency anemia, unspecified: Secondary | ICD-10-CM | POA: Diagnosis not present

## 2017-03-16 DIAGNOSIS — D630 Anemia in neoplastic disease: Secondary | ICD-10-CM | POA: Diagnosis not present

## 2017-03-16 DIAGNOSIS — N186 End stage renal disease: Secondary | ICD-10-CM | POA: Diagnosis not present

## 2017-03-16 DIAGNOSIS — N2581 Secondary hyperparathyroidism of renal origin: Secondary | ICD-10-CM | POA: Diagnosis not present

## 2017-03-16 DIAGNOSIS — R17 Unspecified jaundice: Secondary | ICD-10-CM | POA: Diagnosis not present

## 2017-03-16 DIAGNOSIS — D631 Anemia in chronic kidney disease: Secondary | ICD-10-CM | POA: Diagnosis not present

## 2017-03-17 DIAGNOSIS — D509 Iron deficiency anemia, unspecified: Secondary | ICD-10-CM | POA: Diagnosis not present

## 2017-03-17 DIAGNOSIS — N186 End stage renal disease: Secondary | ICD-10-CM | POA: Diagnosis not present

## 2017-03-17 DIAGNOSIS — D630 Anemia in neoplastic disease: Secondary | ICD-10-CM | POA: Diagnosis not present

## 2017-03-17 DIAGNOSIS — D631 Anemia in chronic kidney disease: Secondary | ICD-10-CM | POA: Diagnosis not present

## 2017-03-17 DIAGNOSIS — R17 Unspecified jaundice: Secondary | ICD-10-CM | POA: Diagnosis not present

## 2017-03-17 DIAGNOSIS — N2581 Secondary hyperparathyroidism of renal origin: Secondary | ICD-10-CM | POA: Diagnosis not present

## 2017-03-18 DIAGNOSIS — N2581 Secondary hyperparathyroidism of renal origin: Secondary | ICD-10-CM | POA: Diagnosis not present

## 2017-03-18 DIAGNOSIS — R17 Unspecified jaundice: Secondary | ICD-10-CM | POA: Diagnosis not present

## 2017-03-18 DIAGNOSIS — D509 Iron deficiency anemia, unspecified: Secondary | ICD-10-CM | POA: Diagnosis not present

## 2017-03-18 DIAGNOSIS — N186 End stage renal disease: Secondary | ICD-10-CM | POA: Diagnosis not present

## 2017-03-18 DIAGNOSIS — D631 Anemia in chronic kidney disease: Secondary | ICD-10-CM | POA: Diagnosis not present

## 2017-03-18 DIAGNOSIS — D630 Anemia in neoplastic disease: Secondary | ICD-10-CM | POA: Diagnosis not present

## 2017-03-19 DIAGNOSIS — N2581 Secondary hyperparathyroidism of renal origin: Secondary | ICD-10-CM | POA: Diagnosis not present

## 2017-03-19 DIAGNOSIS — D509 Iron deficiency anemia, unspecified: Secondary | ICD-10-CM | POA: Diagnosis not present

## 2017-03-19 DIAGNOSIS — N186 End stage renal disease: Secondary | ICD-10-CM | POA: Diagnosis not present

## 2017-03-19 DIAGNOSIS — D630 Anemia in neoplastic disease: Secondary | ICD-10-CM | POA: Diagnosis not present

## 2017-03-19 DIAGNOSIS — D631 Anemia in chronic kidney disease: Secondary | ICD-10-CM | POA: Diagnosis not present

## 2017-03-19 DIAGNOSIS — R17 Unspecified jaundice: Secondary | ICD-10-CM | POA: Diagnosis not present

## 2017-03-20 DIAGNOSIS — D631 Anemia in chronic kidney disease: Secondary | ICD-10-CM | POA: Diagnosis not present

## 2017-03-20 DIAGNOSIS — R17 Unspecified jaundice: Secondary | ICD-10-CM | POA: Diagnosis not present

## 2017-03-20 DIAGNOSIS — N186 End stage renal disease: Secondary | ICD-10-CM | POA: Diagnosis not present

## 2017-03-20 DIAGNOSIS — D509 Iron deficiency anemia, unspecified: Secondary | ICD-10-CM | POA: Diagnosis not present

## 2017-03-20 DIAGNOSIS — N2581 Secondary hyperparathyroidism of renal origin: Secondary | ICD-10-CM | POA: Diagnosis not present

## 2017-03-20 DIAGNOSIS — D630 Anemia in neoplastic disease: Secondary | ICD-10-CM | POA: Diagnosis not present

## 2017-03-21 DIAGNOSIS — D631 Anemia in chronic kidney disease: Secondary | ICD-10-CM | POA: Diagnosis not present

## 2017-03-21 DIAGNOSIS — R17 Unspecified jaundice: Secondary | ICD-10-CM | POA: Diagnosis not present

## 2017-03-21 DIAGNOSIS — D509 Iron deficiency anemia, unspecified: Secondary | ICD-10-CM | POA: Diagnosis not present

## 2017-03-21 DIAGNOSIS — D630 Anemia in neoplastic disease: Secondary | ICD-10-CM | POA: Diagnosis not present

## 2017-03-21 DIAGNOSIS — N2581 Secondary hyperparathyroidism of renal origin: Secondary | ICD-10-CM | POA: Diagnosis not present

## 2017-03-21 DIAGNOSIS — N186 End stage renal disease: Secondary | ICD-10-CM | POA: Diagnosis not present

## 2017-03-22 DIAGNOSIS — N2581 Secondary hyperparathyroidism of renal origin: Secondary | ICD-10-CM | POA: Diagnosis not present

## 2017-03-22 DIAGNOSIS — D509 Iron deficiency anemia, unspecified: Secondary | ICD-10-CM | POA: Diagnosis not present

## 2017-03-22 DIAGNOSIS — R17 Unspecified jaundice: Secondary | ICD-10-CM | POA: Diagnosis not present

## 2017-03-22 DIAGNOSIS — D631 Anemia in chronic kidney disease: Secondary | ICD-10-CM | POA: Diagnosis not present

## 2017-03-22 DIAGNOSIS — N186 End stage renal disease: Secondary | ICD-10-CM | POA: Diagnosis not present

## 2017-03-22 DIAGNOSIS — D630 Anemia in neoplastic disease: Secondary | ICD-10-CM | POA: Diagnosis not present

## 2017-03-23 DIAGNOSIS — D631 Anemia in chronic kidney disease: Secondary | ICD-10-CM | POA: Diagnosis not present

## 2017-03-23 DIAGNOSIS — D509 Iron deficiency anemia, unspecified: Secondary | ICD-10-CM | POA: Diagnosis not present

## 2017-03-23 DIAGNOSIS — D630 Anemia in neoplastic disease: Secondary | ICD-10-CM | POA: Diagnosis not present

## 2017-03-23 DIAGNOSIS — N186 End stage renal disease: Secondary | ICD-10-CM | POA: Diagnosis not present

## 2017-03-23 DIAGNOSIS — N2581 Secondary hyperparathyroidism of renal origin: Secondary | ICD-10-CM | POA: Diagnosis not present

## 2017-03-23 DIAGNOSIS — R17 Unspecified jaundice: Secondary | ICD-10-CM | POA: Diagnosis not present

## 2017-03-24 DIAGNOSIS — R17 Unspecified jaundice: Secondary | ICD-10-CM | POA: Diagnosis not present

## 2017-03-24 DIAGNOSIS — D509 Iron deficiency anemia, unspecified: Secondary | ICD-10-CM | POA: Diagnosis not present

## 2017-03-24 DIAGNOSIS — D630 Anemia in neoplastic disease: Secondary | ICD-10-CM | POA: Diagnosis not present

## 2017-03-24 DIAGNOSIS — N186 End stage renal disease: Secondary | ICD-10-CM | POA: Diagnosis not present

## 2017-03-24 DIAGNOSIS — D631 Anemia in chronic kidney disease: Secondary | ICD-10-CM | POA: Diagnosis not present

## 2017-03-24 DIAGNOSIS — N2581 Secondary hyperparathyroidism of renal origin: Secondary | ICD-10-CM | POA: Diagnosis not present

## 2017-03-25 DIAGNOSIS — R17 Unspecified jaundice: Secondary | ICD-10-CM | POA: Diagnosis not present

## 2017-03-25 DIAGNOSIS — D509 Iron deficiency anemia, unspecified: Secondary | ICD-10-CM | POA: Diagnosis not present

## 2017-03-25 DIAGNOSIS — N2581 Secondary hyperparathyroidism of renal origin: Secondary | ICD-10-CM | POA: Diagnosis not present

## 2017-03-25 DIAGNOSIS — N186 End stage renal disease: Secondary | ICD-10-CM | POA: Diagnosis not present

## 2017-03-25 DIAGNOSIS — D630 Anemia in neoplastic disease: Secondary | ICD-10-CM | POA: Diagnosis not present

## 2017-03-25 DIAGNOSIS — D631 Anemia in chronic kidney disease: Secondary | ICD-10-CM | POA: Diagnosis not present

## 2017-03-26 DIAGNOSIS — N2581 Secondary hyperparathyroidism of renal origin: Secondary | ICD-10-CM | POA: Diagnosis not present

## 2017-03-26 DIAGNOSIS — D509 Iron deficiency anemia, unspecified: Secondary | ICD-10-CM | POA: Diagnosis not present

## 2017-03-26 DIAGNOSIS — R17 Unspecified jaundice: Secondary | ICD-10-CM | POA: Diagnosis not present

## 2017-03-26 DIAGNOSIS — N186 End stage renal disease: Secondary | ICD-10-CM | POA: Diagnosis not present

## 2017-03-26 DIAGNOSIS — D630 Anemia in neoplastic disease: Secondary | ICD-10-CM | POA: Diagnosis not present

## 2017-03-26 DIAGNOSIS — D631 Anemia in chronic kidney disease: Secondary | ICD-10-CM | POA: Diagnosis not present

## 2017-03-27 DIAGNOSIS — D630 Anemia in neoplastic disease: Secondary | ICD-10-CM | POA: Diagnosis not present

## 2017-03-27 DIAGNOSIS — D631 Anemia in chronic kidney disease: Secondary | ICD-10-CM | POA: Diagnosis not present

## 2017-03-27 DIAGNOSIS — N186 End stage renal disease: Secondary | ICD-10-CM | POA: Diagnosis not present

## 2017-03-27 DIAGNOSIS — D509 Iron deficiency anemia, unspecified: Secondary | ICD-10-CM | POA: Diagnosis not present

## 2017-03-27 DIAGNOSIS — R17 Unspecified jaundice: Secondary | ICD-10-CM | POA: Diagnosis not present

## 2017-03-27 DIAGNOSIS — N2581 Secondary hyperparathyroidism of renal origin: Secondary | ICD-10-CM | POA: Diagnosis not present

## 2017-03-28 DIAGNOSIS — D631 Anemia in chronic kidney disease: Secondary | ICD-10-CM | POA: Diagnosis not present

## 2017-03-28 DIAGNOSIS — D630 Anemia in neoplastic disease: Secondary | ICD-10-CM | POA: Diagnosis not present

## 2017-03-28 DIAGNOSIS — D509 Iron deficiency anemia, unspecified: Secondary | ICD-10-CM | POA: Diagnosis not present

## 2017-03-28 DIAGNOSIS — R17 Unspecified jaundice: Secondary | ICD-10-CM | POA: Diagnosis not present

## 2017-03-28 DIAGNOSIS — N186 End stage renal disease: Secondary | ICD-10-CM | POA: Diagnosis not present

## 2017-03-28 DIAGNOSIS — N2581 Secondary hyperparathyroidism of renal origin: Secondary | ICD-10-CM | POA: Diagnosis not present

## 2017-03-29 DIAGNOSIS — R17 Unspecified jaundice: Secondary | ICD-10-CM | POA: Diagnosis not present

## 2017-03-29 DIAGNOSIS — D509 Iron deficiency anemia, unspecified: Secondary | ICD-10-CM | POA: Diagnosis not present

## 2017-03-29 DIAGNOSIS — N186 End stage renal disease: Secondary | ICD-10-CM | POA: Diagnosis not present

## 2017-03-29 DIAGNOSIS — D630 Anemia in neoplastic disease: Secondary | ICD-10-CM | POA: Diagnosis not present

## 2017-03-29 DIAGNOSIS — D631 Anemia in chronic kidney disease: Secondary | ICD-10-CM | POA: Diagnosis not present

## 2017-03-29 DIAGNOSIS — N2581 Secondary hyperparathyroidism of renal origin: Secondary | ICD-10-CM | POA: Diagnosis not present

## 2017-03-30 DIAGNOSIS — D630 Anemia in neoplastic disease: Secondary | ICD-10-CM | POA: Diagnosis not present

## 2017-03-30 DIAGNOSIS — D509 Iron deficiency anemia, unspecified: Secondary | ICD-10-CM | POA: Diagnosis not present

## 2017-03-30 DIAGNOSIS — N2581 Secondary hyperparathyroidism of renal origin: Secondary | ICD-10-CM | POA: Diagnosis not present

## 2017-03-30 DIAGNOSIS — N186 End stage renal disease: Secondary | ICD-10-CM | POA: Diagnosis not present

## 2017-03-30 DIAGNOSIS — R17 Unspecified jaundice: Secondary | ICD-10-CM | POA: Diagnosis not present

## 2017-03-30 DIAGNOSIS — D631 Anemia in chronic kidney disease: Secondary | ICD-10-CM | POA: Diagnosis not present

## 2017-03-31 DIAGNOSIS — D630 Anemia in neoplastic disease: Secondary | ICD-10-CM | POA: Diagnosis not present

## 2017-03-31 DIAGNOSIS — D631 Anemia in chronic kidney disease: Secondary | ICD-10-CM | POA: Diagnosis not present

## 2017-03-31 DIAGNOSIS — N186 End stage renal disease: Secondary | ICD-10-CM | POA: Diagnosis not present

## 2017-03-31 DIAGNOSIS — D509 Iron deficiency anemia, unspecified: Secondary | ICD-10-CM | POA: Diagnosis not present

## 2017-03-31 DIAGNOSIS — N2581 Secondary hyperparathyroidism of renal origin: Secondary | ICD-10-CM | POA: Diagnosis not present

## 2017-03-31 DIAGNOSIS — R17 Unspecified jaundice: Secondary | ICD-10-CM | POA: Diagnosis not present

## 2017-04-01 DIAGNOSIS — D631 Anemia in chronic kidney disease: Secondary | ICD-10-CM | POA: Diagnosis not present

## 2017-04-01 DIAGNOSIS — N186 End stage renal disease: Secondary | ICD-10-CM | POA: Diagnosis not present

## 2017-04-01 DIAGNOSIS — N2581 Secondary hyperparathyroidism of renal origin: Secondary | ICD-10-CM | POA: Diagnosis not present

## 2017-04-01 DIAGNOSIS — D630 Anemia in neoplastic disease: Secondary | ICD-10-CM | POA: Diagnosis not present

## 2017-04-01 DIAGNOSIS — D509 Iron deficiency anemia, unspecified: Secondary | ICD-10-CM | POA: Diagnosis not present

## 2017-04-01 DIAGNOSIS — R17 Unspecified jaundice: Secondary | ICD-10-CM | POA: Diagnosis not present

## 2017-04-02 DIAGNOSIS — N186 End stage renal disease: Secondary | ICD-10-CM | POA: Diagnosis not present

## 2017-04-02 DIAGNOSIS — D630 Anemia in neoplastic disease: Secondary | ICD-10-CM | POA: Diagnosis not present

## 2017-04-02 DIAGNOSIS — D631 Anemia in chronic kidney disease: Secondary | ICD-10-CM | POA: Diagnosis not present

## 2017-04-02 DIAGNOSIS — R17 Unspecified jaundice: Secondary | ICD-10-CM | POA: Diagnosis not present

## 2017-04-02 DIAGNOSIS — D509 Iron deficiency anemia, unspecified: Secondary | ICD-10-CM | POA: Diagnosis not present

## 2017-04-02 DIAGNOSIS — N2581 Secondary hyperparathyroidism of renal origin: Secondary | ICD-10-CM | POA: Diagnosis not present

## 2017-04-03 DIAGNOSIS — R17 Unspecified jaundice: Secondary | ICD-10-CM | POA: Diagnosis not present

## 2017-04-03 DIAGNOSIS — N2581 Secondary hyperparathyroidism of renal origin: Secondary | ICD-10-CM | POA: Diagnosis not present

## 2017-04-03 DIAGNOSIS — E1129 Type 2 diabetes mellitus with other diabetic kidney complication: Secondary | ICD-10-CM | POA: Diagnosis not present

## 2017-04-03 DIAGNOSIS — D630 Anemia in neoplastic disease: Secondary | ICD-10-CM | POA: Diagnosis not present

## 2017-04-03 DIAGNOSIS — D631 Anemia in chronic kidney disease: Secondary | ICD-10-CM | POA: Diagnosis not present

## 2017-04-03 DIAGNOSIS — D509 Iron deficiency anemia, unspecified: Secondary | ICD-10-CM | POA: Diagnosis not present

## 2017-04-03 DIAGNOSIS — N186 End stage renal disease: Secondary | ICD-10-CM | POA: Diagnosis not present

## 2017-04-03 DIAGNOSIS — Z992 Dependence on renal dialysis: Secondary | ICD-10-CM | POA: Diagnosis not present

## 2017-04-04 DIAGNOSIS — N2589 Other disorders resulting from impaired renal tubular function: Secondary | ICD-10-CM | POA: Diagnosis not present

## 2017-04-04 DIAGNOSIS — N2581 Secondary hyperparathyroidism of renal origin: Secondary | ICD-10-CM | POA: Diagnosis not present

## 2017-04-04 DIAGNOSIS — D631 Anemia in chronic kidney disease: Secondary | ICD-10-CM | POA: Diagnosis not present

## 2017-04-04 DIAGNOSIS — N186 End stage renal disease: Secondary | ICD-10-CM | POA: Diagnosis not present

## 2017-04-04 DIAGNOSIS — D509 Iron deficiency anemia, unspecified: Secondary | ICD-10-CM | POA: Diagnosis not present

## 2017-04-04 DIAGNOSIS — D630 Anemia in neoplastic disease: Secondary | ICD-10-CM | POA: Diagnosis not present

## 2017-04-05 DIAGNOSIS — N186 End stage renal disease: Secondary | ICD-10-CM | POA: Diagnosis not present

## 2017-04-05 DIAGNOSIS — D509 Iron deficiency anemia, unspecified: Secondary | ICD-10-CM | POA: Diagnosis not present

## 2017-04-05 DIAGNOSIS — N2589 Other disorders resulting from impaired renal tubular function: Secondary | ICD-10-CM | POA: Diagnosis not present

## 2017-04-05 DIAGNOSIS — D631 Anemia in chronic kidney disease: Secondary | ICD-10-CM | POA: Diagnosis not present

## 2017-04-05 DIAGNOSIS — N2581 Secondary hyperparathyroidism of renal origin: Secondary | ICD-10-CM | POA: Diagnosis not present

## 2017-04-05 DIAGNOSIS — D630 Anemia in neoplastic disease: Secondary | ICD-10-CM | POA: Diagnosis not present

## 2017-04-06 DIAGNOSIS — N2581 Secondary hyperparathyroidism of renal origin: Secondary | ICD-10-CM | POA: Diagnosis not present

## 2017-04-06 DIAGNOSIS — E7849 Other hyperlipidemia: Secondary | ICD-10-CM | POA: Diagnosis not present

## 2017-04-06 DIAGNOSIS — N186 End stage renal disease: Secondary | ICD-10-CM | POA: Diagnosis not present

## 2017-04-06 DIAGNOSIS — D630 Anemia in neoplastic disease: Secondary | ICD-10-CM | POA: Diagnosis not present

## 2017-04-06 DIAGNOSIS — D509 Iron deficiency anemia, unspecified: Secondary | ICD-10-CM | POA: Diagnosis not present

## 2017-04-06 DIAGNOSIS — N2589 Other disorders resulting from impaired renal tubular function: Secondary | ICD-10-CM | POA: Diagnosis not present

## 2017-04-06 DIAGNOSIS — E1151 Type 2 diabetes mellitus with diabetic peripheral angiopathy without gangrene: Secondary | ICD-10-CM | POA: Diagnosis not present

## 2017-04-06 DIAGNOSIS — D631 Anemia in chronic kidney disease: Secondary | ICD-10-CM | POA: Diagnosis not present

## 2017-04-07 DIAGNOSIS — N2589 Other disorders resulting from impaired renal tubular function: Secondary | ICD-10-CM | POA: Diagnosis not present

## 2017-04-07 DIAGNOSIS — E119 Type 2 diabetes mellitus without complications: Secondary | ICD-10-CM | POA: Diagnosis not present

## 2017-04-07 DIAGNOSIS — N186 End stage renal disease: Secondary | ICD-10-CM | POA: Diagnosis not present

## 2017-04-07 DIAGNOSIS — D509 Iron deficiency anemia, unspecified: Secondary | ICD-10-CM | POA: Diagnosis not present

## 2017-04-07 DIAGNOSIS — D631 Anemia in chronic kidney disease: Secondary | ICD-10-CM | POA: Diagnosis not present

## 2017-04-07 DIAGNOSIS — N2581 Secondary hyperparathyroidism of renal origin: Secondary | ICD-10-CM | POA: Diagnosis not present

## 2017-04-07 DIAGNOSIS — D630 Anemia in neoplastic disease: Secondary | ICD-10-CM | POA: Diagnosis not present

## 2017-04-08 DIAGNOSIS — N186 End stage renal disease: Secondary | ICD-10-CM | POA: Diagnosis not present

## 2017-04-08 DIAGNOSIS — D509 Iron deficiency anemia, unspecified: Secondary | ICD-10-CM | POA: Diagnosis not present

## 2017-04-08 DIAGNOSIS — N2589 Other disorders resulting from impaired renal tubular function: Secondary | ICD-10-CM | POA: Diagnosis not present

## 2017-04-08 DIAGNOSIS — D630 Anemia in neoplastic disease: Secondary | ICD-10-CM | POA: Diagnosis not present

## 2017-04-08 DIAGNOSIS — N2581 Secondary hyperparathyroidism of renal origin: Secondary | ICD-10-CM | POA: Diagnosis not present

## 2017-04-08 DIAGNOSIS — D631 Anemia in chronic kidney disease: Secondary | ICD-10-CM | POA: Diagnosis not present

## 2017-04-09 DIAGNOSIS — D509 Iron deficiency anemia, unspecified: Secondary | ICD-10-CM | POA: Diagnosis not present

## 2017-04-09 DIAGNOSIS — D631 Anemia in chronic kidney disease: Secondary | ICD-10-CM | POA: Diagnosis not present

## 2017-04-09 DIAGNOSIS — D630 Anemia in neoplastic disease: Secondary | ICD-10-CM | POA: Diagnosis not present

## 2017-04-09 DIAGNOSIS — N2589 Other disorders resulting from impaired renal tubular function: Secondary | ICD-10-CM | POA: Diagnosis not present

## 2017-04-09 DIAGNOSIS — N186 End stage renal disease: Secondary | ICD-10-CM | POA: Diagnosis not present

## 2017-04-09 DIAGNOSIS — N2581 Secondary hyperparathyroidism of renal origin: Secondary | ICD-10-CM | POA: Diagnosis not present

## 2017-04-10 DIAGNOSIS — D509 Iron deficiency anemia, unspecified: Secondary | ICD-10-CM | POA: Diagnosis not present

## 2017-04-10 DIAGNOSIS — N2589 Other disorders resulting from impaired renal tubular function: Secondary | ICD-10-CM | POA: Diagnosis not present

## 2017-04-10 DIAGNOSIS — N186 End stage renal disease: Secondary | ICD-10-CM | POA: Diagnosis not present

## 2017-04-10 DIAGNOSIS — N2581 Secondary hyperparathyroidism of renal origin: Secondary | ICD-10-CM | POA: Diagnosis not present

## 2017-04-10 DIAGNOSIS — D631 Anemia in chronic kidney disease: Secondary | ICD-10-CM | POA: Diagnosis not present

## 2017-04-10 DIAGNOSIS — D630 Anemia in neoplastic disease: Secondary | ICD-10-CM | POA: Diagnosis not present

## 2017-04-11 DIAGNOSIS — N186 End stage renal disease: Secondary | ICD-10-CM | POA: Diagnosis not present

## 2017-04-11 DIAGNOSIS — D631 Anemia in chronic kidney disease: Secondary | ICD-10-CM | POA: Diagnosis not present

## 2017-04-11 DIAGNOSIS — D509 Iron deficiency anemia, unspecified: Secondary | ICD-10-CM | POA: Diagnosis not present

## 2017-04-11 DIAGNOSIS — N2581 Secondary hyperparathyroidism of renal origin: Secondary | ICD-10-CM | POA: Diagnosis not present

## 2017-04-11 DIAGNOSIS — D630 Anemia in neoplastic disease: Secondary | ICD-10-CM | POA: Diagnosis not present

## 2017-04-11 DIAGNOSIS — N2589 Other disorders resulting from impaired renal tubular function: Secondary | ICD-10-CM | POA: Diagnosis not present

## 2017-04-12 DIAGNOSIS — N2589 Other disorders resulting from impaired renal tubular function: Secondary | ICD-10-CM | POA: Diagnosis not present

## 2017-04-12 DIAGNOSIS — N186 End stage renal disease: Secondary | ICD-10-CM | POA: Diagnosis not present

## 2017-04-12 DIAGNOSIS — D631 Anemia in chronic kidney disease: Secondary | ICD-10-CM | POA: Diagnosis not present

## 2017-04-12 DIAGNOSIS — D509 Iron deficiency anemia, unspecified: Secondary | ICD-10-CM | POA: Diagnosis not present

## 2017-04-12 DIAGNOSIS — D630 Anemia in neoplastic disease: Secondary | ICD-10-CM | POA: Diagnosis not present

## 2017-04-12 DIAGNOSIS — N2581 Secondary hyperparathyroidism of renal origin: Secondary | ICD-10-CM | POA: Diagnosis not present

## 2017-04-13 DIAGNOSIS — D631 Anemia in chronic kidney disease: Secondary | ICD-10-CM | POA: Diagnosis not present

## 2017-04-13 DIAGNOSIS — N186 End stage renal disease: Secondary | ICD-10-CM | POA: Diagnosis not present

## 2017-04-13 DIAGNOSIS — D509 Iron deficiency anemia, unspecified: Secondary | ICD-10-CM | POA: Diagnosis not present

## 2017-04-13 DIAGNOSIS — D630 Anemia in neoplastic disease: Secondary | ICD-10-CM | POA: Diagnosis not present

## 2017-04-13 DIAGNOSIS — N2581 Secondary hyperparathyroidism of renal origin: Secondary | ICD-10-CM | POA: Diagnosis not present

## 2017-04-13 DIAGNOSIS — N2589 Other disorders resulting from impaired renal tubular function: Secondary | ICD-10-CM | POA: Diagnosis not present

## 2017-04-14 DIAGNOSIS — D509 Iron deficiency anemia, unspecified: Secondary | ICD-10-CM | POA: Diagnosis not present

## 2017-04-14 DIAGNOSIS — D630 Anemia in neoplastic disease: Secondary | ICD-10-CM | POA: Diagnosis not present

## 2017-04-14 DIAGNOSIS — N2581 Secondary hyperparathyroidism of renal origin: Secondary | ICD-10-CM | POA: Diagnosis not present

## 2017-04-14 DIAGNOSIS — N186 End stage renal disease: Secondary | ICD-10-CM | POA: Diagnosis not present

## 2017-04-14 DIAGNOSIS — D631 Anemia in chronic kidney disease: Secondary | ICD-10-CM | POA: Diagnosis not present

## 2017-04-14 DIAGNOSIS — N2589 Other disorders resulting from impaired renal tubular function: Secondary | ICD-10-CM | POA: Diagnosis not present

## 2017-04-15 DIAGNOSIS — N2581 Secondary hyperparathyroidism of renal origin: Secondary | ICD-10-CM | POA: Diagnosis not present

## 2017-04-15 DIAGNOSIS — D630 Anemia in neoplastic disease: Secondary | ICD-10-CM | POA: Diagnosis not present

## 2017-04-15 DIAGNOSIS — D631 Anemia in chronic kidney disease: Secondary | ICD-10-CM | POA: Diagnosis not present

## 2017-04-15 DIAGNOSIS — N2589 Other disorders resulting from impaired renal tubular function: Secondary | ICD-10-CM | POA: Diagnosis not present

## 2017-04-15 DIAGNOSIS — N186 End stage renal disease: Secondary | ICD-10-CM | POA: Diagnosis not present

## 2017-04-15 DIAGNOSIS — D509 Iron deficiency anemia, unspecified: Secondary | ICD-10-CM | POA: Diagnosis not present

## 2017-04-16 DIAGNOSIS — N2581 Secondary hyperparathyroidism of renal origin: Secondary | ICD-10-CM | POA: Diagnosis not present

## 2017-04-16 DIAGNOSIS — N186 End stage renal disease: Secondary | ICD-10-CM | POA: Diagnosis not present

## 2017-04-16 DIAGNOSIS — D630 Anemia in neoplastic disease: Secondary | ICD-10-CM | POA: Diagnosis not present

## 2017-04-16 DIAGNOSIS — D509 Iron deficiency anemia, unspecified: Secondary | ICD-10-CM | POA: Diagnosis not present

## 2017-04-16 DIAGNOSIS — D631 Anemia in chronic kidney disease: Secondary | ICD-10-CM | POA: Diagnosis not present

## 2017-04-16 DIAGNOSIS — N2589 Other disorders resulting from impaired renal tubular function: Secondary | ICD-10-CM | POA: Diagnosis not present

## 2017-04-17 DIAGNOSIS — N186 End stage renal disease: Secondary | ICD-10-CM | POA: Diagnosis not present

## 2017-04-17 DIAGNOSIS — N2581 Secondary hyperparathyroidism of renal origin: Secondary | ICD-10-CM | POA: Diagnosis not present

## 2017-04-17 DIAGNOSIS — D631 Anemia in chronic kidney disease: Secondary | ICD-10-CM | POA: Diagnosis not present

## 2017-04-17 DIAGNOSIS — D630 Anemia in neoplastic disease: Secondary | ICD-10-CM | POA: Diagnosis not present

## 2017-04-17 DIAGNOSIS — D509 Iron deficiency anemia, unspecified: Secondary | ICD-10-CM | POA: Diagnosis not present

## 2017-04-17 DIAGNOSIS — N2589 Other disorders resulting from impaired renal tubular function: Secondary | ICD-10-CM | POA: Diagnosis not present

## 2017-04-18 DIAGNOSIS — D631 Anemia in chronic kidney disease: Secondary | ICD-10-CM | POA: Diagnosis not present

## 2017-04-18 DIAGNOSIS — N2589 Other disorders resulting from impaired renal tubular function: Secondary | ICD-10-CM | POA: Diagnosis not present

## 2017-04-18 DIAGNOSIS — D509 Iron deficiency anemia, unspecified: Secondary | ICD-10-CM | POA: Diagnosis not present

## 2017-04-18 DIAGNOSIS — D630 Anemia in neoplastic disease: Secondary | ICD-10-CM | POA: Diagnosis not present

## 2017-04-18 DIAGNOSIS — N2581 Secondary hyperparathyroidism of renal origin: Secondary | ICD-10-CM | POA: Diagnosis not present

## 2017-04-18 DIAGNOSIS — N186 End stage renal disease: Secondary | ICD-10-CM | POA: Diagnosis not present

## 2017-04-19 ENCOUNTER — Encounter (HOSPITAL_BASED_OUTPATIENT_CLINIC_OR_DEPARTMENT_OTHER): Payer: Self-pay

## 2017-04-19 ENCOUNTER — Inpatient Hospital Stay (HOSPITAL_BASED_OUTPATIENT_CLINIC_OR_DEPARTMENT_OTHER)
Admission: EM | Admit: 2017-04-19 | Discharge: 2017-04-22 | DRG: 193 | Disposition: A | Discharge status: Home / Self Care | Payer: Medicare Other | Attending: Internal Medicine | Admitting: Internal Medicine

## 2017-04-19 ENCOUNTER — Emergency Department (HOSPITAL_BASED_OUTPATIENT_CLINIC_OR_DEPARTMENT_OTHER): Payer: Medicare Other

## 2017-04-19 DIAGNOSIS — Z8249 Family history of ischemic heart disease and other diseases of the circulatory system: Secondary | ICD-10-CM | POA: Diagnosis not present

## 2017-04-19 DIAGNOSIS — E1122 Type 2 diabetes mellitus with diabetic chronic kidney disease: Secondary | ICD-10-CM | POA: Diagnosis not present

## 2017-04-19 DIAGNOSIS — Z9842 Cataract extraction status, left eye: Secondary | ICD-10-CM | POA: Diagnosis not present

## 2017-04-19 DIAGNOSIS — L409 Psoriasis, unspecified: Secondary | ICD-10-CM | POA: Diagnosis present

## 2017-04-19 DIAGNOSIS — IMO0002 Reserved for concepts with insufficient information to code with codable children: Secondary | ICD-10-CM

## 2017-04-19 DIAGNOSIS — Z79899 Other long term (current) drug therapy: Secondary | ICD-10-CM | POA: Diagnosis not present

## 2017-04-19 DIAGNOSIS — N186 End stage renal disease: Secondary | ICD-10-CM

## 2017-04-19 DIAGNOSIS — N2581 Secondary hyperparathyroidism of renal origin: Secondary | ICD-10-CM | POA: Diagnosis present

## 2017-04-19 DIAGNOSIS — Z9841 Cataract extraction status, right eye: Secondary | ICD-10-CM | POA: Diagnosis not present

## 2017-04-19 DIAGNOSIS — E0822 Diabetes mellitus due to underlying condition with diabetic chronic kidney disease: Secondary | ICD-10-CM

## 2017-04-19 DIAGNOSIS — Y95 Nosocomial condition: Secondary | ICD-10-CM | POA: Diagnosis present

## 2017-04-19 DIAGNOSIS — E1022 Type 1 diabetes mellitus with diabetic chronic kidney disease: Secondary | ICD-10-CM | POA: Diagnosis present

## 2017-04-19 DIAGNOSIS — N184 Chronic kidney disease, stage 4 (severe): Secondary | ICD-10-CM | POA: Diagnosis not present

## 2017-04-19 DIAGNOSIS — Z961 Presence of intraocular lens: Secondary | ICD-10-CM | POA: Diagnosis present

## 2017-04-19 DIAGNOSIS — I5032 Chronic diastolic (congestive) heart failure: Secondary | ICD-10-CM | POA: Diagnosis present

## 2017-04-19 DIAGNOSIS — I509 Heart failure, unspecified: Secondary | ICD-10-CM

## 2017-04-19 DIAGNOSIS — R569 Unspecified convulsions: Secondary | ICD-10-CM | POA: Diagnosis present

## 2017-04-19 DIAGNOSIS — D630 Anemia in neoplastic disease: Secondary | ICD-10-CM | POA: Diagnosis not present

## 2017-04-19 DIAGNOSIS — I132 Hypertensive heart and chronic kidney disease with heart failure and with stage 5 chronic kidney disease, or end stage renal disease: Secondary | ICD-10-CM | POA: Diagnosis present

## 2017-04-19 DIAGNOSIS — R079 Chest pain, unspecified: Secondary | ICD-10-CM

## 2017-04-19 DIAGNOSIS — E0865 Diabetes mellitus due to underlying condition with hyperglycemia: Secondary | ICD-10-CM

## 2017-04-19 DIAGNOSIS — Z992 Dependence on renal dialysis: Secondary | ICD-10-CM | POA: Diagnosis not present

## 2017-04-19 DIAGNOSIS — I10 Essential (primary) hypertension: Secondary | ICD-10-CM | POA: Diagnosis present

## 2017-04-19 DIAGNOSIS — D72829 Elevated white blood cell count, unspecified: Secondary | ICD-10-CM | POA: Diagnosis present

## 2017-04-19 DIAGNOSIS — E876 Hypokalemia: Secondary | ICD-10-CM | POA: Diagnosis not present

## 2017-04-19 DIAGNOSIS — D509 Iron deficiency anemia, unspecified: Secondary | ICD-10-CM | POA: Diagnosis not present

## 2017-04-19 DIAGNOSIS — T380X5A Adverse effect of glucocorticoids and synthetic analogues, initial encounter: Secondary | ICD-10-CM | POA: Diagnosis present

## 2017-04-19 DIAGNOSIS — J189 Pneumonia, unspecified organism: Secondary | ICD-10-CM | POA: Diagnosis not present

## 2017-04-19 DIAGNOSIS — N2589 Other disorders resulting from impaired renal tubular function: Secondary | ICD-10-CM | POA: Diagnosis not present

## 2017-04-19 DIAGNOSIS — J181 Lobar pneumonia, unspecified organism: Secondary | ICD-10-CM | POA: Diagnosis not present

## 2017-04-19 DIAGNOSIS — Z794 Long term (current) use of insulin: Secondary | ICD-10-CM | POA: Diagnosis not present

## 2017-04-19 DIAGNOSIS — J90 Pleural effusion, not elsewhere classified: Secondary | ICD-10-CM | POA: Diagnosis not present

## 2017-04-19 DIAGNOSIS — J918 Pleural effusion in other conditions classified elsewhere: Secondary | ICD-10-CM | POA: Diagnosis not present

## 2017-04-19 DIAGNOSIS — D631 Anemia in chronic kidney disease: Secondary | ICD-10-CM | POA: Diagnosis not present

## 2017-04-19 DIAGNOSIS — Z880 Allergy status to penicillin: Secondary | ICD-10-CM | POA: Diagnosis not present

## 2017-04-19 DIAGNOSIS — J9601 Acute respiratory failure with hypoxia: Secondary | ICD-10-CM | POA: Diagnosis present

## 2017-04-19 DIAGNOSIS — D649 Anemia, unspecified: Secondary | ICD-10-CM | POA: Diagnosis present

## 2017-04-19 DIAGNOSIS — L408 Other psoriasis: Secondary | ICD-10-CM | POA: Diagnosis not present

## 2017-04-19 LAB — COMPREHENSIVE METABOLIC PANEL
ALBUMIN: 2.2 g/dL — AB (ref 3.5–5.0)
ALT: 22 U/L (ref 17–63)
AST: 17 U/L (ref 15–41)
Alkaline Phosphatase: 89 U/L (ref 38–126)
Anion gap: 11 (ref 5–15)
BILIRUBIN TOTAL: 0.5 mg/dL (ref 0.3–1.2)
BUN: 46 mg/dL — AB (ref 6–20)
CO2: 26 mmol/L (ref 22–32)
CREATININE: 17.62 mg/dL — AB (ref 0.61–1.24)
Calcium: 7 mg/dL — ABNORMAL LOW (ref 8.9–10.3)
Chloride: 96 mmol/L — ABNORMAL LOW (ref 101–111)
GFR calc Af Amer: 3 mL/min — ABNORMAL LOW (ref >60)
GFR, EST NON AFRICAN AMERICAN: 3 mL/min — AB (ref >60)
GLUCOSE: 230 mg/dL — AB (ref 65–99)
POTASSIUM: 3.3 mmol/L — AB (ref 3.5–5.1)
Sodium: 133 mmol/L — ABNORMAL LOW (ref 135–145)
TOTAL PROTEIN: 7.6 g/dL (ref 6.5–8.1)

## 2017-04-19 LAB — CBC
HEMATOCRIT: 34.2 % — AB (ref 39.0–52.0)
HEMOGLOBIN: 11.2 g/dL — AB (ref 13.0–17.0)
MCH: 28.3 pg (ref 26.0–34.0)
MCHC: 32.7 g/dL (ref 30.0–36.0)
MCV: 86.4 fL (ref 78.0–100.0)
PLATELETS: 159 10*3/uL (ref 150–400)
RBC: 3.96 MIL/uL — AB (ref 4.22–5.81)
RDW: 14.8 % (ref 11.5–15.5)
WBC: 13.8 10*3/uL — AB (ref 4.0–10.5)

## 2017-04-19 LAB — GLUCOSE, CAPILLARY: Glucose-Capillary: 177 mg/dL — ABNORMAL HIGH (ref 65–99)

## 2017-04-19 LAB — INFLUENZA PANEL BY PCR (TYPE A & B)
INFLAPCR: NEGATIVE
Influenza B By PCR: NEGATIVE

## 2017-04-19 LAB — TROPONIN I: Troponin I: 0.05 ng/mL (ref <0.03)

## 2017-04-19 MED ORDER — INSULIN GLARGINE 100 UNIT/ML ~~LOC~~ SOLN
5.0000 [IU] | Freq: Every day | SUBCUTANEOUS | Status: DC
  Administered 2017-04-20 (×2): 5 [IU] via SUBCUTANEOUS
  Filled 2017-04-19 (×3): qty 0.05

## 2017-04-19 MED ORDER — HYDRALAZINE HCL 20 MG/ML IJ SOLN: 5.0000 mg | INTRAMUSCULAR | Status: DC | PRN

## 2017-04-19 MED ORDER — ACETAMINOPHEN 325 MG PO TABS: 650.0000 mg | ORAL_TABLET | Freq: Four times a day (QID) | ORAL | Status: DC | PRN

## 2017-04-19 MED ORDER — PREDNISONE 20 MG PO TABS
20.0000 mg | ORAL_TABLET | Freq: Every day | ORAL | Status: DC
  Administered 2017-04-20 – 2017-04-21 (×2): 20 mg via ORAL
  Filled 2017-04-19 (×3): qty 1

## 2017-04-19 MED ORDER — RENA-VITE PO TABS
1.0000 | ORAL_TABLET | Freq: Every day | ORAL | Status: DC
  Administered 2017-04-20 – 2017-04-21 (×2): 1 via ORAL
  Filled 2017-04-19 (×2): qty 1

## 2017-04-19 MED ORDER — ENOXAPARIN SODIUM 30 MG/0.3ML ~~LOC~~ SOLN
30.0000 mg | SUBCUTANEOUS | Status: DC
  Filled 2017-04-19 (×3): qty 0.3

## 2017-04-19 MED ORDER — AZITHROMYCIN 500 MG IV SOLR
INTRAVENOUS | Status: AC
  Filled 2017-04-19: qty 500

## 2017-04-19 MED ORDER — ACETAMINOPHEN 650 MG RE SUPP: 650.0000 mg | Freq: Four times a day (QID) | RECTAL | Status: DC | PRN

## 2017-04-19 MED ORDER — DEXTROSE 5 % IV SOLN
500.0000 mg | Freq: Three times a day (TID) | INTRAVENOUS | Status: DC
  Administered 2017-04-20 – 2017-04-22 (×8): 500 mg via INTRAVENOUS
  Filled 2017-04-19 (×11): qty 0.5

## 2017-04-19 MED ORDER — ZOLPIDEM TARTRATE 5 MG PO TABS: 5.0000 mg | ORAL_TABLET | Freq: Every evening | ORAL | Status: DC | PRN

## 2017-04-19 MED ORDER — CAMPHOR-MENTHOL 0.5-0.5 % EX LOTN
1.0000 "application " | TOPICAL_LOTION | Freq: Three times a day (TID) | CUTANEOUS | Status: DC | PRN
  Filled 2017-04-19: qty 222

## 2017-04-19 MED ORDER — NEPRO/CARBSTEADY PO LIQD
237.0000 mL | Freq: Three times a day (TID) | ORAL | Status: DC | PRN
  Filled 2017-04-19: qty 237

## 2017-04-19 MED ORDER — DEXTROSE 5 % IV SOLN
500.0000 mg | Freq: Once | INTRAVENOUS | Status: AC
  Administered 2017-04-19: 500 mg via INTRAVENOUS
  Filled 2017-04-19: qty 500

## 2017-04-19 MED ORDER — CALCIUM CARBONATE ANTACID 1250 MG/5ML PO SUSP: 500.0000 mg | Freq: Four times a day (QID) | ORAL | Status: DC | PRN

## 2017-04-19 MED ORDER — INSULIN ASPART 100 UNIT/ML ~~LOC~~ SOLN
0.0000 [IU] | Freq: Every day | SUBCUTANEOUS | Status: DC
  Administered 2017-04-21: 2 [IU] via SUBCUTANEOUS

## 2017-04-19 MED ORDER — DOCUSATE SODIUM 283 MG RE ENEM
1.0000 | ENEMA | RECTAL | Status: DC | PRN
  Filled 2017-04-19: qty 1

## 2017-04-19 MED ORDER — HYDROXYZINE HCL 25 MG PO TABS: 25.0000 mg | ORAL_TABLET | Freq: Three times a day (TID) | ORAL | Status: DC | PRN

## 2017-04-19 MED ORDER — CARVEDILOL 6.25 MG PO TABS
6.2500 mg | ORAL_TABLET | Freq: Two times a day (BID) | ORAL | Status: DC
  Administered 2017-04-20 – 2017-04-22 (×6): 6.25 mg via ORAL
  Filled 2017-04-19 (×6): qty 1

## 2017-04-19 MED ORDER — CALCITRIOL 0.25 MCG PO CAPS
0.2500 ug | ORAL_CAPSULE | Freq: Every day | ORAL | Status: DC
  Administered 2017-04-20: 0.25 ug via ORAL
  Filled 2017-04-19: qty 1

## 2017-04-19 MED ORDER — DEXTROSE 5 % IV SOLN
1.0000 g | Freq: Once | INTRAVENOUS | Status: AC
  Administered 2017-04-19: 1 g via INTRAVENOUS
  Filled 2017-04-19: qty 10

## 2017-04-19 MED ORDER — SODIUM CHLORIDE 0.9 % IV SOLN
INTRAVENOUS | Status: DC
  Administered 2017-04-20: via INTRAVENOUS

## 2017-04-19 MED ORDER — SORBITOL 70 % SOLN
30.0000 mL | Status: DC | PRN
  Filled 2017-04-19: qty 30

## 2017-04-19 MED ORDER — CALCIUM ACETATE (PHOS BINDER) 667 MG PO CAPS
2668.0000 mg | ORAL_CAPSULE | Freq: Three times a day (TID) | ORAL | Status: DC
  Administered 2017-04-20 – 2017-04-22 (×7): 2668 mg via ORAL
  Filled 2017-04-19 (×10): qty 4

## 2017-04-19 MED ORDER — ONDANSETRON HCL 4 MG/2ML IJ SOLN: 4.0000 mg | Freq: Four times a day (QID) | INTRAMUSCULAR | Status: DC | PRN

## 2017-04-19 MED ORDER — INSULIN ASPART 100 UNIT/ML ~~LOC~~ SOLN
0.0000 [IU] | Freq: Three times a day (TID) | SUBCUTANEOUS | Status: DC
  Administered 2017-04-20: 2 [IU] via SUBCUTANEOUS
  Administered 2017-04-20 (×2): 3 [IU] via SUBCUTANEOUS
  Administered 2017-04-21: 2 [IU] via SUBCUTANEOUS
  Administered 2017-04-21 – 2017-04-22 (×2): 9 [IU] via SUBCUTANEOUS
  Administered 2017-04-22: 2 [IU] via SUBCUTANEOUS

## 2017-04-19 MED ORDER — SODIUM CHLORIDE 0.9 % IV SOLN
1750.0000 mg | Freq: Once | INTRAVENOUS | Status: AC
  Administered 2017-04-20: 1750 mg via INTRAVENOUS
  Filled 2017-04-19: qty 1750

## 2017-04-19 MED ORDER — ONDANSETRON HCL 4 MG PO TABS: 4.0000 mg | ORAL_TABLET | Freq: Four times a day (QID) | ORAL | Status: DC | PRN

## 2017-04-19 NOTE — Progress Notes (Signed)
PHARMACY NOTE:  ANTIMICROBIAL RENAL DOSAGE ADJUSTMENT  Current antimicrobial regimen includes a mismatch between antimicrobial dosage and estimated renal function.  As per policy approved by the Pharmacy & Therapeutics and Medical Executive Committees, the antimicrobial dosage will be adjusted accordingly.  Current antimicrobial dosage:  Aztreonam 2g IV q8hr   Indication: PNA  Renal Function:  Estimated Creatinine Clearance: 5.7 mL/min (A) (by C-G formula based on SCr of 17.62 mg/dL (H)). [x]      On intermittent HD, scheduled:  Peritoneal dialysis  []      On CRRT    Antimicrobial dosage has been changed to: Aztreonam 500 mg IV q8hr (25% of usual dose)   Lavonda Jumbo, PharmD Clinical Pharmacist 04/19/17 11:32 PM

## 2017-04-19 NOTE — ED Notes (Signed)
[facility] on the way to pick up pt. Pt and family updated.

## 2017-04-19 NOTE — Progress Notes (Signed)
Called for this patient who is a 51yo with ESRD on peritoneal dialysis presenting with chest pain.  Found to have sats 81%, CXR with CAP and effusion.  No concern for CHF exacerbation.  +sick contacts.  Influenza pending.  On 2L O2 currently.  Treated as CAP since he does not officially do HD, given Rocephin/Azithromycin.  Will admit to Med Surg bed.  Carlyon Shadow, M.D.

## 2017-04-19 NOTE — ED Notes (Signed)
Pt on monitor 

## 2017-04-19 NOTE — H&P (Addendum)
History and Physical    Bryan Wilkerson WLN:[id number] DOB: [birth year] DOA: 04/19/2017  PCP: [name], MD Consultants:  Chalmers Cater - endocrinology; Justin Mend - nephrology Patient coming from:  Home - lives with wife; NOK: wife, [phone]  Chief Complaint: chest pain  HPI: Bryan Wilkerson is a 51 y.o. male with medical history significant of DM, HTN, ESRD on peritoneal dialysis, and CHF (preserved EF in 3/17, presumed abnormal diastolic function in 5/63) presenting with chest pain "with breathing, talking ,coughing, walking, moving".  Symptoms started on Friday.  He tried taking Tylenol, using a heating pad without relief.  No cough.  +SOB with any movement.  No h/o PNA recently.  No fever.  He uses peritoneal dialysis daily.     ED Course: Hypoxia down to 82% on RA, left-sided pneumonia with effusion.  Given Rocephin/Azithromycin since the patient is on PD instead of HD.  Review of Systems: As per HPI; otherwise review of systems reviewed and negative.   Ambulatory Status:  Ambulates without assistance  Past Medical History:  Diagnosis Date  . Anemia   . CHF (congestive heart failure) ([place])   . Diabetic retinopathy ([place])   . ESRD on peritoneal dialysis ([place])    "7 days/week" (11/23/2016)  . Hypertension   . Pneumonia 2016  . Psoriasis   . Type I diabetes mellitus ([place])     Past Surgical History:  Procedure Laterality Date  . AV FISTULA PLACEMENT Right 12/05/2013   Procedure: RADIOCEPHALIC VS. BRACHIOCEPHALIC ARTERIOVENOUS (AV) FISTULA CREATION;  Surgeon: Conrad Knox, MD;  Location: [facility];  Service: Vascular;  Laterality: Right;  . AV FISTULA PLACEMENT Left 07/20/2016   Procedure: LEFT ARM RADIOCEPHALIC ARTERIOVENOUS (AV) FISTULA CREATION;  Surgeon: Waynetta Sandy, MD;  Location: [facility];  Service: Vascular;  Laterality: Left;  . CATARACT EXTRACTION W/ INTRAOCULAR LENS  IMPLANT, BILATERAL Bilateral   . EYE SURGERY    . FISTULOGRAM Right 07/16/2014   Procedure:  FISTULOGRAM;  Surgeon: Conrad Coyle, MD;  Location: [facility];  Service: Vascular;  Laterality: Right;  . LIGATION OF COMPETING BRANCHES OF ARTERIOVENOUS FISTULA Right 07/16/2014   Procedure: LIGATION OF COMPETING BRANCHES OF ARTERIOVENOUS FISTULA;  Surgeon: Conrad Emporium, MD;  Location: [facility];  Service: Vascular;  Laterality: Right;  . PERITONEAL CATHETER INSERTION Left ~ 08/2016  . PORTA CATH INSERTION Right 2017   "for hemodialysis"  . RETINAL DETACHMENT SURGERY Right     Social History   Social History  . Marital status: Married    Spouse name: N/A  . Number of children: N/A  . Years of education: N/A   Occupational History  . logistics    Social History Main Topics  . Smoking status: Never Smoker  . Smokeless tobacco: Never Used  . Alcohol use No  . Drug use: Yes    Types: Other-see comments  . Sexual activity: Not on file   Other Topics Concern  . Not on file   Social History Narrative  . No narrative on file    Allergies  Allergen Reactions  . Penicillins Other (See Comments)    UNSPECIFIED REACTION FROM CHILDHOOD Has patient had a PCN reaction causing immediate rash, facial/tongue/throat swelling, SOB or lightheadedness with hypotension:Yes Has patient had a PCN reaction causing severe rash involving mucus membranes or skin necrosis:No Has patient had a PCN reaction that required hospitalization:Yes Has patient had a PCN reaction occurring within the last 10 years:No If all of the above answers are "NO", then may proceed  with Cephalosporin use.      Family History  Problem Relation Age of Onset  . Hypertension Mother   . Heart attack Mother   . Chronic Renal Failure Neg Hx   . Diabetes Neg Hx   . Stroke Neg Hx   . Cancer Neg Hx     Prior to Admission medications   Medication Sig Start Date End Date Taking? Authorizing Provider  calcitRIOL (ROCALTROL) 0.25 MCG capsule Take 1 capsule (0.25 mcg total) by mouth daily. 11/28/16   Hosie Poisson, MD  calcium  acetate (PHOSLO) 667 MG capsule Take 4 capsules (2,668 mg total) by mouth 3 (three) times daily with meals. 11/27/16   Hosie Poisson, MD  carvedilol (COREG) 6.25 MG tablet Take 1 tablet (6.25 mg total) by mouth 2 (two) times daily with a meal. 11/27/16   Hosie Poisson, MD  divalproex (DEPAKOTE) 500 MG DR tablet Take 1 tablet (500 mg total) by mouth every 8 (eight) hours. Patient not taking: Reported on 01/13/2017 11/27/16   Hosie Poisson, MD  insulin NPH-regular Human (NOVOLIN 70/30) (70-30) 100 UNIT/ML injection Inject 4 Units into the skin daily.     [provider]  multivitamin (RENA-VIT) TABS tablet Take 1 tablet by mouth at bedtime. 11/27/16   Hosie Poisson, MD  STELARA 45 MG/0.5ML SOSY Inject 45 mg into the skin See admin instructions. EVERY 12 WEEKS 07/04/14   [provider]  triamcinolone ointment (KENALOG) 0.1 % Apply 1 application topically as needed (psorasis).  10/10/13   [provider]    Physical Exam: Vitals:   04/19/17 1930 04/19/17 2000 04/19/17 2030 04/19/17 2214  BP: (!) 149/87 (!) 159/91 (!) 171/95 (!) 157/89  Pulse: 76 75 75 78  Resp: 20 18 19 20   Temp:   99.4 F (37.4 C) 99.4 F (37.4 C)  TempSrc:   Oral Oral  SpO2: 96% 96% 98% 96%  Weight:    87.4 kg (192 lb 9.6 oz)  Height:    6\' 1"  (1.854 m)     General:  Appears calm and comfortable and is NAD Eyes:   EOMI, normal lids, iris ENT:  grossly normal hearing, lips & tongue, mmm Neck:  no LAD, masses or thyromegaly Cardiovascular:  RRR, no m/r/g. No LE edema.  Respiratory:  Diminished breath sounds in the LLL.  Normal respiratory effort. Abdomen: soft, NT, ND, PD binder in place Back:   normal alignment, no CVAT Skin:  no rash or induration seen on limited exam Musculoskeletal:  grossly normal tone BUE/BLE, good ROM, no bony abnormality Psychiatric:  grossly normal mood and affect, speech fluent and appropriate, AOx3 Neurologic:  CN 2-12 grossly intact, moves all extremities in  coordinated fashion, sensation intact    Radiological Exams on Admission: Dg Chest 2 View  Result Date: 04/19/2017 CLINICAL DATA:  Worsening left chest pain over the past few days EXAM: CHEST  2 VIEW COMPARISON:  01/13/2017 FINDINGS: Cardiac shadow is mildly enlarged. Previously seen dialysis catheter has been removed. Upper extremity stenting on the left is noted. Mild right basilar atelectasis is noted. More marked left basilar infiltrate with associated effusion is seen. This would correspond with the patient's given clinical history. IMPRESSION: Bibasilar changes worse on the left than the right with associated effusion. Followup PA and lateral chest X-ray is recommended in 3-4 weeks following trial of antibiotic therapy to ensure resolution and exclude underlying malignancy. Electronically Signed   By: Inez Catalina M.D.   On: 04/19/2017 16:48    EKG:  Independently reviewed.  NSR with rate 76; LAE; nonspecific ST changes with no evidence of acute ischemia; NSCSLT   Labs on Admission: I have personally reviewed the available labs and imaging studies at the time of the admission.  Pertinent labs:   Na++ 133 - stable K+ 3.3 Glucose 230 BUN 46/Creatinine 17.62/GFR 3 - stable Calcium 7.0 - stable Albumin 2.2 - stable Troponin 0.05 - stable WBC 13.8 Hgb 11.2 - stable Influenza negative  Assessment/Plan Principal Problem:   Pneumonia Active Problems:   CHF (congestive heart failure), NYHA class II (HCC)   Essential hypertension, benign   Anemia   ESRD on dialysis ([place])   Hypocalcemia   Diabetes mellitus due to underlying condition, uncontrolled, with stage 4 chronic kidney disease, with long-term current use of insulin (H[facility])   Pneumonia with effusion -Patient with pleuritic chest pain and decreased oxygen saturation, with left>right bibasilar infiltrates on chest x-ray, most likely pneumonia.  -Since he is on peritoneal dialysis but no hemodialysis, it is not entirely clear  whether he should be treated for CAP or HCAP; will err on the side of caution and cover with broad spectrum abx (aztreonam and vanc given PCN allergy) -Influenza negative. -CURB-65 score is 1 - will admit the patient to Med Surg. -Pneumonia Severity Index (PSI) is Class 4, 9.3% mortality. -Corticosteroids have been to shown to low overall mortality rate; risk of ARDS; and need for mechanical ventilation.  This is particularly true in severe PNA (class 3+ PSI).  Will add 20 mg prednisone daily for 3-7 days. -Additional complicating factors include: hypoxia/hypoxemia; associated pleural effusions. -NS @ 50cc/hr x 10 hours. -Fever control -Repeat CBC in am -Sputum cultures -Blood cultures -Strep pneumo testing -Will order lower respiratory tract procalcitonin level.  Antibiotics would not be indicated for PCT <0.1 and probably should not be used for < 0.25.  >0.5 indicates infection and >>0.5 indicates more serious disease.  As the procalcitonin level normalizes, it will be reasonable to consider de-escalation of antibiotic coverage. -Will trend troponin, although suspect that this mild (improved) elevation is related to renal disease rather than cardiac. -Has effusion but no current indication to tap.  Suggest re-imaging if patient is not improving clinically.  ESRD on HD -Patient on chronic peritoneal dialysis -Nephrology prn order set utilized  Hypocalcemia -Appears to be chronic -Will check ionized calcium in the AM  DM -He is interestingly on 70/30 8 units daily. -Will change to 5 units Lantus -Will cover with sensitive-scale SSI  HTN -He does not appear to be taking medications for HTN at this time -He may benefit from addition of a chronic controller medication such as ACE-I, which tend to work well for HTN control in dialysis patients -Will cover with IV hydralazine prn for now  Anemia -Likely due to ESRD -Appears to be stable, will follow  CHF -Unimpressive Echo in  3/17 -Does not appear to be decompensated -Will follow clinically  DVT prophylaxis:  Lovenox  Code Status:  Full - confirmed with patient Family Communication: None present Disposition Plan:  Home once clinically improved Consults called: None  Admission status: Admit - It is my clinical opinion that admission to [facility] is reasonable and necessary because this patient will require at least 2 midnights in the hospital to treat this condition based on the medical complexity of the problems presented.  Given the aforementioned information, the predictability of an adverse outcome is felt to be significant.     Karmen Bongo MD [facility]  If  note is complete, please contact covering daytime or nighttime physician. [email or web address] Password [email or web address]  04/19/2017, 11:21 PM

## 2017-04-19 NOTE — Progress Notes (Signed)
Influenza swab negative by PCR. Droplet precaution removed.

## 2017-04-19 NOTE — ED Triage Notes (Signed)
CP x 3 days-NAD-presents to triage in w/c

## 2017-04-19 NOTE — Progress Notes (Signed)
Pharmacy Antibiotic Note  Bryan Wilkerson is a 51 y.o. male with h/o ESRD on peritoneal dialysis admitted on 04/19/2017 with pneumonia.  Pharmacy has been consulted for Vancomycin dosing.  Plan: Vancomycin 1750 mg IV now F/U vancomycin level in 3-4 days and repeat doses as needed  Height: 6\' 1"  (185.4 cm) Weight: 192 lb 9.6 oz (87.4 kg) (bedscale) IBW/kg (Calculated) : 79.9  Temp (24hrs), Avg:99.3 F (37.4 C), Min:99.1 F (37.3 C), Max:99.4 F (37.4 C)   Recent Labs Lab 04/19/17 1655  WBC 13.8*  CREATININE 17.62*    Estimated Creatinine Clearance: 5.7 mL/min (A) (by C-G formula based on SCr of 17.62 mg/dL (H)).    Allergies  Allergen Reactions  . Penicillins Other (See Comments)    UNSPECIFIED REACTION FROM CHILDHOOD Has patient had a PCN reaction causing immediate rash, facial/tongue/throat swelling, SOB or lightheadedness with hypotension:Yes Has patient had a PCN reaction causing severe rash involving mucus membranes or skin necrosis:No Has patient had a PCN reaction that required hospitalization:Yes Has patient had a PCN reaction occurring within the last 10 years:No If all of the above answers are "NO", then may proceed with Cephalosporin use.      Caryl Pina 04/19/2017 11:34 PM

## 2017-04-19 NOTE — ED Provider Notes (Signed)
[facility] EMERGENCY DEPARTMENT Provider Note   CSN: [id number] Arrival date & time: 04/19/17  1600     History   Chief Complaint Chief Complaint  Patient presents with  . Chest Pain    HPI Bryan Wilkerson is a 51 y.o. male.  Patient reports history of left sided chest pain x 3 days.  Associated exertional shortness of breath. Mild non-productive cough.   The history is provided by the patient. No language interpreter was used.  Chest Pain   This is a new problem. The current episode started more than 2 days ago. The problem occurs constantly. The problem has been gradually worsening. The pain is associated with exertion. The pain is present in the lateral region. The pain is moderate. The quality of the pain is described as exertional and pleuritic. The symptoms are aggravated by deep breathing and exertion. Associated symptoms include cough and shortness of breath. Pertinent negatives include no abdominal pain.  His past medical history is significant for CHF and diabetes. Past medical history comments: ESRD on peritoneal dialysis    Past Medical History:  Diagnosis Date  . Anemia   . CHF (congestive heart failure) ([place])   . Diabetic retinopathy ([place])   . ESRD on peritoneal dialysis ([place])    "7 days/week" (11/23/2016)  . Hypertension   . Pneumonia 2016  . Psoriasis   . Type I diabetes mellitus [facility])     Patient Active Problem List   Diagnosis Date Noted  . Diabetes mellitus due to underlying condition, uncontrolled, with stage 4 chronic kidney disease, with long-term current use of insulin ([place]) 01/13/2017  . Seizure ([place]) 11/23/2016  . Hypocalcemia 11/23/2016  . Observed seizure-like activity ([place]) 11/22/2016  . ESRD on dialysis ([place]) 11/16/2013  . Pre-operative cardiovascular examination 11/12/2013  . Dyspnea 10/31/2013  . CHF (congestive heart failure), NYHA class II ([place]) 12/29/2012  . Essential hypertension, benign 12/29/2012  . Hyperkalemia  12/29/2012  . Anemia 12/29/2012    Past Surgical History:  Procedure Laterality Date  . AV FISTULA PLACEMENT Right 12/05/2013   Procedure: RADIOCEPHALIC VS. BRACHIOCEPHALIC ARTERIOVENOUS (AV) FISTULA CREATION;  Surgeon: Conrad New Preston, MD;  Location: [facility];  Service: Vascular;  Laterality: Right;  . AV FISTULA PLACEMENT Left 07/20/2016   Procedure: LEFT ARM RADIOCEPHALIC ARTERIOVENOUS (AV) FISTULA CREATION;  Surgeon: Waynetta Sandy, MD;  Location: [facility];  Service: Vascular;  Laterality: Left;  . CATARACT EXTRACTION W/ INTRAOCULAR LENS  IMPLANT, BILATERAL Bilateral   . EYE SURGERY    . FISTULOGRAM Right 07/16/2014   Procedure: FISTULOGRAM;  Surgeon: Conrad Pastos, MD;  Location: [facility];  Service: Vascular;  Laterality: Right;  . LIGATION OF COMPETING BRANCHES OF ARTERIOVENOUS FISTULA Right 07/16/2014   Procedure: LIGATION OF COMPETING BRANCHES OF ARTERIOVENOUS FISTULA;  Surgeon: Conrad Ossian, MD;  Location: [facility];  Service: Vascular;  Laterality: Right;  . PERITONEAL CATHETER INSERTION Left ~ 08/2016  . PORTA CATH INSERTION Right 2017   "for hemodialysis"  . RETINAL DETACHMENT SURGERY Right        Home Medications    Prior to Admission medications   Medication Sig Start Date End Date Taking? Authorizing Provider  calcitRIOL (ROCALTROL) 0.25 MCG capsule Take 1 capsule (0.25 mcg total) by mouth daily. 11/28/16   Hosie Poisson, MD  calcium acetate (PHOSLO) 667 MG capsule Take 4 capsules (2,668 mg total) by mouth 3 (three) times daily with meals. 11/27/16   Hosie Poisson, MD  carvedilol (COREG) 6.25 MG tablet Take  1 tablet (6.25 mg total) by mouth 2 (two) times daily with a meal. 11/27/16   Hosie Poisson, MD  divalproex (DEPAKOTE) 500 MG DR tablet Take 1 tablet (500 mg total) by mouth every 8 (eight) hours. Patient not taking: Reported on 01/13/2017 11/27/16   Hosie Poisson, MD  insulin NPH-regular Human (NOVOLIN 70/30) (70-30) 100 UNIT/ML injection Inject 4 Units into the skin daily.      [provider]  multivitamin (RENA-VIT) TABS tablet Take 1 tablet by mouth at bedtime. 11/27/16   Hosie Poisson, MD  STELARA 45 MG/0.5ML SOSY Inject 45 mg into the skin See admin instructions. EVERY 12 WEEKS 07/04/14   [provider]  triamcinolone ointment (KENALOG) 0.1 % Apply 1 application topically as needed (psorasis).  10/10/13   [provider]    Family History Family History  Problem Relation Age of Onset  . Hypertension Mother   . Heart attack Mother   . Chronic Renal Failure Neg Hx   . Diabetes Neg Hx   . Stroke Neg Hx   . Cancer Neg Hx     Social History Social History  Substance Use Topics  . Smoking status: Never Smoker  . Smokeless tobacco: Never Used  . Alcohol use No     Allergies   Penicillins   Review of Systems Review of Systems  Respiratory: Positive for cough and shortness of breath.   Cardiovascular: Positive for chest pain.  Gastrointestinal: Negative for abdominal pain.  All other systems reviewed and are negative.    Physical Exam Updated Vital Signs BP 120/81 (BP Location: Right Arm)   Pulse 76   Temp 99.1 F (37.3 C) (Oral)   Resp 20   Ht 6\' 2"  (1.88 m)   Wt 88.5 kg (195 lb)   SpO2 (!) 86%   BMI 25.04 kg/m   Physical Exam  Constitutional: He is oriented to person, place, and time. He appears well-developed and well-nourished.  HENT:  Head: Normocephalic and atraumatic.  Eyes: Conjunctivae are normal.  Neck: Neck supple.  Cardiovascular: Normal rate and regular rhythm.   Pulmonary/Chest: Effort normal and breath sounds normal.  Abdominal: Soft. Bowel sounds are normal. He exhibits no distension. There is no tenderness.  Musculoskeletal: Normal range of motion. He exhibits no edema.  Neurological: He is alert and oriented to person, place, and time.  Skin: Skin is warm and dry.  Psychiatric: He has a normal mood and affect.  Nursing note and vitals reviewed.    ED Treatments / Results  Labs (all  labs ordered are listed, but only abnormal results are displayed) Labs Reviewed  CBC - Abnormal; Notable for the following:       Result Value   WBC 13.8 (*)    RBC 3.96 (*)    Hemoglobin 11.2 (*)    HCT 34.2 (*)    All other components within normal limits  TROPONIN I - Abnormal; Notable for the following:    Troponin I 0.05 (*)    All other components within normal limits  COMPREHENSIVE METABOLIC PANEL - Abnormal; Notable for the following:    Sodium 133 (*)    Potassium 3.3 (*)    Chloride 96 (*)    Glucose, Bld 230 (*)    BUN 46 (*)    Creatinine, Ser 17.62 (*)    Calcium 7.0 (*)    Albumin 2.2 (*)    GFR calc non Af Amer 3 (*)    GFR calc Af Amer 3 (*)  All other components within normal limits  INFLUENZA PANEL BY PCR (TYPE A & B)    EKG  EKG Interpretation  Date/Time:  Tuesday April 19 2017 16:21:12 EDT Ventricular Rate:  76 PR Interval:    QRS Duration: 96 QT Interval:  409 QTC Calculation: 460 R Axis:   16 Text Interpretation:  Sinus rhythm Left atrial enlargement Abnormal T, consider ischemia, lateral leads No significant change since last tracing Confirmed by Jola Schmidt [phone]) on 04/19/2017 4:38:39 PM       Radiology No results found.  Procedures Procedures (including critical care time)  Medications Ordered in ED Medications - No data to display   Initial Impression / Assessment and Plan / ED Course  I have reviewed the triage vital signs and the nursing notes.  Pertinent labs & imaging results that were available during my care of the patient were reviewed by me and considered in my medical decision making (see chart for details).     Patient with left sided pleuritic chest pain. Oxygen saturations in the low 80's upon arrival. Chest xray reveals pneumonia with effusion. Patient discussed with and seen by Dr. Venora Maples. Multiple comorbidities.  Started on rocephin and azithromycin in ED. Will request admission to hospitalist service at  [facility].  Final Clinical Impressions(s) / ED Diagnoses   Final diagnoses:  Community acquired pneumonia of left lower lobe of lung [facility])    New Prescriptions New Prescriptions   No medications on file     Etta Quill, NP 04/19/17 Sonda Rumble, MD 04/19/17 2039

## 2017-04-20 ENCOUNTER — Encounter (HOSPITAL_COMMUNITY): Payer: Self-pay | Admitting: General Practice

## 2017-04-20 DIAGNOSIS — J189 Pneumonia, unspecified organism: Secondary | ICD-10-CM

## 2017-04-20 DIAGNOSIS — I10 Essential (primary) hypertension: Secondary | ICD-10-CM

## 2017-04-20 DIAGNOSIS — N2581 Secondary hyperparathyroidism of renal origin: Secondary | ICD-10-CM | POA: Diagnosis not present

## 2017-04-20 DIAGNOSIS — Z992 Dependence on renal dialysis: Secondary | ICD-10-CM

## 2017-04-20 DIAGNOSIS — N186 End stage renal disease: Secondary | ICD-10-CM

## 2017-04-20 DIAGNOSIS — D509 Iron deficiency anemia, unspecified: Secondary | ICD-10-CM | POA: Diagnosis not present

## 2017-04-20 DIAGNOSIS — D630 Anemia in neoplastic disease: Secondary | ICD-10-CM | POA: Diagnosis not present

## 2017-04-20 DIAGNOSIS — N2589 Other disorders resulting from impaired renal tubular function: Secondary | ICD-10-CM | POA: Diagnosis not present

## 2017-04-20 DIAGNOSIS — D631 Anemia in chronic kidney disease: Secondary | ICD-10-CM | POA: Diagnosis not present

## 2017-04-20 DIAGNOSIS — J181 Lobar pneumonia, unspecified organism: Principal | ICD-10-CM

## 2017-04-20 LAB — GLUCOSE, CAPILLARY
GLUCOSE-CAPILLARY: 153 mg/dL — AB (ref 65–99)
GLUCOSE-CAPILLARY: 157 mg/dL — AB (ref 65–99)
GLUCOSE-CAPILLARY: 230 mg/dL — AB (ref 65–99)
GLUCOSE-CAPILLARY: 182 mg/dL — AB (ref 65–99)
Glucose-Capillary: 207 mg/dL — ABNORMAL HIGH (ref 65–99)

## 2017-04-20 LAB — BASIC METABOLIC PANEL
ANION GAP: 16 — AB (ref 5–15)
BUN: 46 mg/dL — ABNORMAL HIGH (ref 6–20)
CO2: 21 mmol/L — AB (ref 22–32)
Calcium: 6.8 mg/dL — ABNORMAL LOW (ref 8.9–10.3)
Chloride: 97 mmol/L — ABNORMAL LOW (ref 101–111)
Creatinine, Ser: 17.97 mg/dL — ABNORMAL HIGH (ref 0.61–1.24)
GFR, EST AFRICAN AMERICAN: 3 mL/min — AB (ref >60)
GFR, EST NON AFRICAN AMERICAN: 3 mL/min — AB (ref >60)
GLUCOSE: 149 mg/dL — AB (ref 65–99)
POTASSIUM: 2.9 mmol/L — AB (ref 3.5–5.1)
Sodium: 134 mmol/L — ABNORMAL LOW (ref 135–145)

## 2017-04-20 LAB — HIV ANTIBODY (ROUTINE TESTING W REFLEX): HIV Screen 4th Generation wRfx: NONREACTIVE

## 2017-04-20 LAB — CBC WITH DIFFERENTIAL/PLATELET
BASOS ABS: 0 10*3/uL (ref 0.0–0.1)
Basophils Relative: 0 %
Eosinophils Absolute: 0 10*3/uL (ref 0.0–0.7)
Eosinophils Relative: 0 %
HEMATOCRIT: 35.1 % — AB (ref 39.0–52.0)
HEMOGLOBIN: 11.5 g/dL — AB (ref 13.0–17.0)
LYMPHS PCT: 4 %
Lymphs Abs: 0.7 10*3/uL (ref 0.7–4.0)
MCH: 28.3 pg (ref 26.0–34.0)
MCHC: 32.8 g/dL (ref 30.0–36.0)
MCV: 86.2 fL (ref 78.0–100.0)
MONO ABS: 0.7 10*3/uL (ref 0.1–1.0)
MONOS PCT: 4 %
NEUTROS ABS: 16 10*3/uL — AB (ref 1.7–7.7)
Neutrophils Relative %: 92 %
Platelets: 172 10*3/uL (ref 150–400)
RBC: 4.07 MIL/uL — ABNORMAL LOW (ref 4.22–5.81)
RDW: 15.3 % (ref 11.5–15.5)
WBC: 17.4 10*3/uL — ABNORMAL HIGH (ref 4.0–10.5)

## 2017-04-20 LAB — TROPONIN I
Troponin I: 0.04 ng/mL (ref <0.03)
Troponin I: 0.17 ng/mL (ref <0.03)
Troponin I: 0.04 ng/mL (ref <0.03)

## 2017-04-20 LAB — PROCALCITONIN: Procalcitonin: 1.35 ng/mL

## 2017-04-20 MED ORDER — POTASSIUM CHLORIDE 20 MEQ PO PACK
40.0000 meq | PACK | Freq: Two times a day (BID) | ORAL | Status: AC
  Administered 2017-04-20 (×2): 40 meq via ORAL
  Filled 2017-04-20 (×3): qty 2

## 2017-04-20 MED ORDER — DELFLEX-LC/2.5% DEXTROSE 394 MOSM/L IP SOLN: INTRAPERITONEAL | Status: DC

## 2017-04-20 MED ORDER — PRO-STAT SUGAR FREE PO LIQD
30.0000 mL | Freq: Two times a day (BID) | ORAL | Status: DC
  Filled 2017-04-20 (×4): qty 30

## 2017-04-20 MED ORDER — GENTAMICIN SULFATE 0.1 % EX CREA
1.0000 "application " | TOPICAL_CREAM | Freq: Every day | CUTANEOUS | Status: DC
  Administered 2017-04-20 – 2017-04-21 (×3): 1 via TOPICAL
  Filled 2017-04-20: qty 15

## 2017-04-20 MED ORDER — HEPARIN 1000 UNIT/ML FOR PERITONEAL DIALYSIS: 500.0000 [IU] | INTRAMUSCULAR | Status: DC | PRN

## 2017-04-20 MED ORDER — HEPARIN 1000 UNIT/ML FOR PERITONEAL DIALYSIS
INTRAPERITONEAL | Status: DC | PRN
  Filled 2017-04-20: qty 3000

## 2017-04-20 MED ORDER — DELFLEX-LC/2.5% DEXTROSE 394 MOSM/L IP SOLN
INTRAPERITONEAL | Status: DC
  Administered 2017-04-20: 19:00:00 via INTRAPERITONEAL

## 2017-04-20 MED ORDER — CALCITRIOL 0.5 MCG PO CAPS
0.5000 ug | ORAL_CAPSULE | Freq: Every day | ORAL | Status: DC
  Administered 2017-04-21 – 2017-04-22 (×2): 0.5 ug via ORAL
  Filled 2017-04-20 (×2): qty 1

## 2017-04-20 NOTE — Progress Notes (Signed)
PD tx initiated via tenckhoff w/o problem, VSS, report given to Alric Ran, RN

## 2017-04-20 NOTE — Progress Notes (Signed)
CRITICAL VALUE ALERT  Critical Value:  Troponin 0.04   Date & Time Notied:  04/20/17 @ 0147  Provider Notified: Raliegh Ip Schorr  Orders Received/Actions taken: Notified Schorr via text page. No further orders received at this time.

## 2017-04-20 NOTE — Progress Notes (Signed)
PROGRESS NOTE   Bryan Wilkerson  NIO:[id number]    DOB: December 31, 1965    DOA: 04/19/2017  PCP: [name], MD   I have briefly reviewed patients previous medical records in [facility].  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 Exposed to family member with respiratory illness. In ED, hypoxic to 82% on room air, chest x-ray showed findings suggestive of left-sided pneumonia with associated pleural effusion. Admitted for suspected HCAP. Nephrology consulted for dialysis needs.   Assessment & Plan:   Principal Problem:   Pneumonia Active Problems:   CHF (congestive heart failure), NYHA class II (HCC)   Essential hypertension, benign   Anemia   ESRD on dialysis ([place])   Hypocalcemia   Diabetes mellitus due to underlying condition, uncontrolled, with stage 4 chronic kidney disease, with long-term current use of insulin (HCC)   Suspected left lobar (LLL) pneumonia/HCAP with pleural effusion/pleuritic chest pain - HIV screen nonreactive. Pro-calcitonin 1.35. Influenza panel PCR negative. Blood cultures 2: Pending. - Continue empirically started on aztreonam, vancomycin (penicillin allergy) and prednisone. - Recommend repeating chest x-ray in 4 weeks to ensure resolution of pneumonia findings. - Clinically improved.  Acute respiratory failure with hypoxia - Secondary to pneumonia, pleural effusion and pleuritic chest pain. Wean off of oxygen as tolerated.  Hypokalemia - Replace and follow BMP in a.m.  ESRD on CCPD - Nephrology consulted for dialysis needs. Discussed with nephrology.  Essential hypertension - Mildly uncontrolled and fluctuating at times. Continue carvedilol.  Anemia - Secondary to chronic kidney disease. Stable  Type I DM - Home  insulin regimen changed to Lantus and SSI while hospitalized. Mildly uncontrolled and fluctuating. May need further adjustment. Steroids likely to precipitate hyperglycemia and would like to limit use to 3 days.  Secondary hyperparathyroidism  - management per nephrology.  Elevated troponin - Minimal with flat trend. Likely related to ESRD rather than ACS.  Chronic diastolic CHF  - Does not appear volume overloaded.    DVT prophylaxis: Lovenox Code Status: Full Family Communication: None at bedside Disposition: DC home in 48-72 hours.    Consultants:  Nephrology    Procedures:  CCPD per nephrology.  Antimicrobials:  Aztreonam and vancomycin per pharmacy    Subjective: Reports feeling much better. Feels stronger. Dyspnea and left-sided chest pain have resolved. Denies cough or fever. Reports sick contacts with respiratory illness. Denies long distance travel. Denies asymmetric pain or swelling of legs.    ROS: As above  Objective:  Vitals:   04/19/17 2214 04/20/17 0435 04/20/17 1300 04/20/17 1435  BP: (!) 157/89 (!) 141/82 (!) 121/55 (!) 159/86  Pulse: 78 (!) 110 79 78  Resp: 20  18 18   Temp: 99.4 F (37.4 C) 99.2 F (37.3 C) 99.4 F (37.4 C) 98.9 F (37.2 C)  TempSrc: Oral Oral Oral Oral  SpO2: 96% 96% 96% 94%  Weight: 87.4 kg (192 lb 9.6 oz)  89.4 kg (197 lb 1.5 oz)   Height: 6\' 1"  (1.854 m)       Examination:  General exam: Pleasant young male, moderately built and nourished, lying comfortably supine in bed.  Respiratory system: reduced breath sounds in left base with occasional crackles but otherwise clear to auscultation. Respiratory effort normal. Cardiovascular system: S1 & S2 heard, RRR. No JVD,  murmurs, rubs, gallops or clicks. No pedal edema. Gastrointestinal system: Abdomen is nondistended, soft and nontender. No organomegaly or masses felt. Normal bowel sounds heard. PD catheter site intact without acute findings.  Central nervous system: Alert and  oriented. No focal neurological deficits. Extremities: Symmetric 5 x 5 power. Skin: No rashes, lesions or ulcers Psychiatry: Judgement and insight appear normal. Mood & affect appropriate.     Data Reviewed: I have personally reviewed following labs and imaging studies  CBC:  Recent Labs Lab 04/19/17 1655 04/20/17 0417  WBC 13.8* 17.4*  NEUTROABS  --  16.0*  HGB 11.2* 11.5*  HCT 34.2* 35.1*  MCV 86.4 86.2  PLT 159 195   Basic Metabolic Panel:  Recent Labs Lab 04/19/17 1655 04/20/17 0417  NA 133* 134*  K 3.3* 2.9*  CL 96* 97*  CO2 26 21*  GLUCOSE 230* 149*  BUN 46* 46*  CREATININE 17.62* 17.97*  CALCIUM 7.0* 6.8*   Liver Function Tests:  Recent Labs Lab 04/19/17 1655  AST 17  ALT 22  ALKPHOS 89  BILITOT 0.5  PROT 7.6  ALBUMIN 2.2*   Cardiac Enzymes:  Recent Labs Lab 04/19/17 1655 04/20/17 0029 04/20/17 0417 04/20/17 1104  TROPONINI 0.05* 0.04* 0.17* 0.04*   CBG:  Recent Labs Lab 04/19/17 2340 04/20/17 0439 04/20/17 0807 04/20/17 1216  GLUCAP 177* 153* 157* 207*    No results found for this or any previous visit (from the past 240 hour(s)).       Radiology Studies: Dg Chest 2 View  Result Date: 04/19/2017 CLINICAL DATA:  Worsening left chest pain over the past few days EXAM: CHEST  2 VIEW COMPARISON:  01/13/2017 FINDINGS: Cardiac shadow is mildly enlarged. Previously seen dialysis catheter has been removed. Upper extremity stenting on the left is noted. Mild right basilar atelectasis is noted. More marked left basilar infiltrate with associated effusion is seen. This would correspond with the patient's given clinical history. IMPRESSION: Bibasilar changes worse on the left than the right with associated effusion. Followup PA and lateral chest X-ray is recommended in 3-4 weeks following trial of antibiotic therapy to ensure resolution and exclude underlying malignancy. Electronically Signed   By: Inez Catalina M.D.   On: 04/19/2017 16:48         Scheduled Meds: . [START ON 04/21/2017] calcitRIOL  0.5 mcg Oral Daily  . calcium acetate  2,668 mg Oral TID WC  . carvedilol  6.25 mg Oral BID WC  . enoxaparin (LOVENOX) injection  30 mg Subcutaneous Q24H  . feeding supplement (PRO-STAT SUGAR FREE 64)  30 mL Oral BID  . gentamicin cream  1 application Topical Daily  . insulin aspart  0-5 Units Subcutaneous QHS  . insulin aspart  0-9 Units Subcutaneous TID WC  . insulin glargine  5 Units Subcutaneous QHS  . multivitamin  1 tablet Oral QHS  . potassium chloride  40 mEq Oral BID  . predniSONE  20 mg Oral Q breakfast   Continuous Infusions: . aztreonam Stopped (04/20/17 1621)  . dialysis solution 2.5% low-MG/low-CA       LOS: 1 day     Priti Consoli, MD, FACP, FHM. [facility] Pager [phone]  If 7PM-7AM, please contact night-coverage [email or web address] Password [email or web address] 04/20/2017, 5:12 PM

## 2017-04-20 NOTE — Consult Note (Signed)
[facility] Renal Consultation Note    Indication for Consultation:  Management of ESRD/hemodialysis; anemia, hypertension/volume and secondary hyperparathyroidism  AUQ:[id number], Gay Filler, MD  HPI: Bryan Wilkerson is a 51 y.o. male. ESRD likely 2/2 DM on CCPD. Past medical history significant for DMT1, HTN, CHF and psoriasis.  Patient admitted 10/16 for management of pneumonia with pleural effusion.    Seen and examined at bedside. Reports symptoms starting 5 days ago initially just as back pain and malaise.  Over the last 3 days he developed worsening SOB, and L sided pleuritic chest pain.  Denies fever, chills, n/v/d, abdominal pain, edema, headache, dizziness, hemoptysis and cough.    Pertinent findings in the ED included CXR showing bibasilar infiltrations L>R with L sided effusion, hypoxia requiring O2, and WBC trending up 13.8>17.4.   Past Medical History:  Diagnosis Date  . Anemia   . CHF (congestive heart failure) ([place])   . Diabetic retinopathy ([place])   . ESRD on peritoneal dialysis ([place])    "7 days/week" (11/23/2016)  . Hypertension   . Pneumonia 2016  . Psoriasis   . Type I diabetes mellitus ([place])    Past Surgical History:  Procedure Laterality Date  . AV FISTULA PLACEMENT Right 12/05/2013   Procedure: RADIOCEPHALIC VS. BRACHIOCEPHALIC ARTERIOVENOUS (AV) FISTULA CREATION;  Surgeon: Conrad [place], MD;  Location: [place];  Service: Vascular;  Laterality: Right;  . AV FISTULA PLACEMENT Left 07/20/2016   Procedure: LEFT ARM RADIOCEPHALIC ARTERIOVENOUS (AV) FISTULA CREATION;  Surgeon: Waynetta Sandy, MD;  Location: [place];  Service: Vascular;  Laterality: Left;  . CATARACT EXTRACTION W/ INTRAOCULAR LENS  IMPLANT, BILATERAL Bilateral   . EYE SURGERY    . FISTULOGRAM Right 07/16/2014   Procedure: FISTULOGRAM;  Surgeon: Conrad Ashby, MD;  Location: [place];  Service: Vascular;  Laterality: Right;  . LIGATION OF COMPETING BRANCHES OF ARTERIOVENOUS FISTULA Right  07/16/2014   Procedure: LIGATION OF COMPETING BRANCHES OF ARTERIOVENOUS FISTULA;  Surgeon: Conrad [place], MD;  Location: [place];  Service: Vascular;  Laterality: Right;  . PERITONEAL CATHETER INSERTION Left ~ 08/2016  . PORTA CATH INSERTION Right 2017   "for hemodialysis"  . RETINAL DETACHMENT SURGERY Right    Family History  Problem Relation Age of Onset  . Hypertension Mother   . Heart attack Mother   . Chronic Renal Failure Neg Hx   . Diabetes Neg Hx   . Stroke Neg Hx   . Cancer Neg Hx    Social History:  reports that he has never smoked. He has never used smokeless tobacco. He reports that he uses drugs, including Other-see comments. He reports that he does not drink alcohol. Allergies  Allergen Reactions  . Penicillins Other (See Comments)    UNSPECIFIED REACTION FROM CHILDHOOD Has patient had a PCN reaction causing immediate rash, facial/tongue/throat swelling, SOB or lightheadedness with hypotension:Yes Has patient had a PCN reaction causing severe rash involving mucus membranes or skin necrosis:No Has patient had a PCN reaction that required hospitalization:Yes Has patient had a PCN reaction occurring within the last 10 years:No If all of the above answers are "NO", then may proceed with Cephalosporin use.     Prior to Admission medications   Medication Sig Start Date End Date Taking? Authorizing Provider  calcitRIOL (ROCALTROL) 0.25 MCG capsule Take 1 capsule (0.25 mcg total) by mouth daily. 11/28/16   Hosie Poisson, MD  calcium acetate (PHOSLO) 667 MG capsule Take 4 capsules (2,668 mg total) by mouth 3 (three)  times daily with meals. 11/27/16   Hosie Poisson, MD  carvedilol (COREG) 6.25 MG tablet Take 1 tablet (6.25 mg total) by mouth 2 (two) times daily with a meal. 11/27/16   Hosie Poisson, MD  divalproex (DEPAKOTE) 500 MG DR tablet Take 1 tablet (500 mg total) by mouth every 8 (eight) hours. Patient not taking: Reported on 01/13/2017 11/27/16   Hosie Poisson, MD  insulin  NPH-regular Human (NOVOLIN 70/30) (70-30) 100 UNIT/ML injection Inject 4 Units into the skin daily.     [provider]  multivitamin (RENA-VIT) TABS tablet Take 1 tablet by mouth at bedtime. 11/27/16   Hosie Poisson, MD  STELARA 45 MG/0.5ML SOSY Inject 45 mg into the skin See admin instructions. EVERY 12 WEEKS 07/04/14   [provider]  triamcinolone ointment (KENALOG) 0.1 % Apply 1 application topically as needed (psorasis).  10/10/13   [provider]   Current Facility-Administered Medications  Medication Dose Route Frequency Provider Last Rate Last Dose  . acetaminophen (TYLENOL) tablet 650 mg  650 mg Oral Q6H PRN Karmen Bongo, MD       Or  . acetaminophen (TYLENOL) suppository 650 mg  650 mg Rectal Q6H PRN Karmen Bongo, MD      . aztreonam (AZACTAM) 500 mg in dextrose 5 % 50 mL IVPB  500 mg Intravenous Lynne Logan, MD   Stopped at 04/20/17 [phone]  . calcitRIOL (ROCALTROL) capsule 0.25 mcg  0.25 mcg Oral Daily Karmen Bongo, MD   0.25 mcg at 04/20/17 0841  . calcium acetate (PHOSLO) capsule 2,668 mg  2,668 mg Oral TID WC Karmen Bongo, MD      . calcium carbonate (dosed in mg elemental calcium) suspension 500 mg of elemental calcium  500 mg of elemental calcium Oral Q6H PRN Karmen Bongo, MD      . camphor-menthol [facility]) lotion 1 application  1 application Topical T2I PRN Karmen Bongo, MD       And  . hydrOXYzine (ATARAX/VISTARIL) tablet 25 mg  25 mg Oral Q8H PRN Karmen Bongo, MD      . carvedilol (COREG) tablet 6.25 mg  6.25 mg Oral BID WC Karmen Bongo, MD   6.25 mg at 04/20/17 0841  . dialysis solution 2.5% low-MG/low-CA dianeal solution   Intraperitoneal Q24H Penninger, Ria Comment, [place]      . dialysis solution 2.5% low-MG/low-CA dianeal solution   Intraperitoneal Q24H Penninger, Ria Comment, [place]      . docusate sodium (ENEMEEZ) enema 283 mg  1 enema Rectal PRN Karmen Bongo, MD      . enoxaparin (LOVENOX) injection 30 mg  30 mg Subcutaneous  Q24H Karmen Bongo, MD      . feeding supplement (NEPRO CARB STEADY) liquid 237 mL  237 mL Oral TID PRN Karmen Bongo, MD      . gentamicin cream (GARAMYCIN) 0.1 % 1 application  1 application Topical Daily Penninger, [place], [place]      . heparin 1000 unit/ml injection 500 Units  500 Units Intraperitoneal PRN Penninger, Ria Comment, [place]      . hydrALAZINE (APRESOLINE) injection 5 mg  5 mg Intravenous Q4H PRN Karmen Bongo, MD      . insulin aspart (novoLOG) injection 0-5 Units  0-5 Units Subcutaneous QHS Karmen Bongo, MD      . insulin aspart (novoLOG) injection 0-9 Units  0-9 Units Subcutaneous TID WC Karmen Bongo, MD   2 Units at 04/20/17 [phone]  . insulin glargine (LANTUS) injection 5 Units  5 Units Subcutaneous QHS Karmen Bongo,  MD   5 Units at 04/20/17 0020  . multivitamin (RENA-VIT) tablet 1 tablet  1 tablet Oral QHS Karmen Bongo, MD      . ondansetron [facility]) tablet 4 mg  4 mg Oral Q6H PRN Karmen Bongo, MD       Or  . ondansetron [facility]) injection 4 mg  4 mg Intravenous Q6H PRN Karmen Bongo, MD      . predniSONE (DELTASONE) tablet 20 mg  20 mg Oral Q breakfast Karmen Bongo, MD   20 mg at 04/20/17 0019  . sorbitol 70 % solution 30 mL  30 mL Oral PRN Karmen Bongo, MD      . zolpidem [facility]) tablet 5 mg  5 mg Oral QHS PRN Karmen Bongo, MD       Labs: Basic Metabolic Panel:  Recent Labs Lab 04/19/17 1655 04/20/17 0417  NA 133* 134*  K 3.3* 2.9*  CL 96* 97*  CO2 26 21*  GLUCOSE 230* 149*  BUN 46* 46*  CREATININE 17.62* 17.97*  CALCIUM 7.0* 6.8*   Liver Function Tests:  Recent Labs Lab 04/19/17 1655  AST 17  ALT 22  ALKPHOS 89  BILITOT 0.5  PROT 7.6  ALBUMIN 2.2*   CBC:  Recent Labs Lab 04/19/17 1655 04/20/17 0417  WBC 13.8* 17.4*  NEUTROABS  --  16.0*  HGB 11.2* 11.5*  HCT 34.2* 35.1*  MCV 86.4 86.2  PLT 159 172   Cardiac Enzymes:  Recent Labs Lab 04/19/17 1655 04/20/17 0029 04/20/17 0417  TROPONINI 0.05* 0.04* 0.17*    CBG:  Recent Labs Lab 04/19/17 2340 04/20/17 0439 04/20/17 0807  GLUCAP 177* 153* 157*  Studies/Results: Dg Chest 2 View  Result Date: 04/19/2017 CLINICAL DATA:  Worsening left chest pain over the past few days EXAM: CHEST  2 VIEW COMPARISON:  01/13/2017 FINDINGS: Cardiac shadow is mildly enlarged. Previously seen dialysis catheter has been removed. Upper extremity stenting on the left is noted. Mild right basilar atelectasis is noted. More marked left basilar infiltrate with associated effusion is seen. This would correspond with the patient's given clinical history. IMPRESSION: Bibasilar changes worse on the left than the right with associated effusion. Followup PA and lateral chest X-ray is recommended in 3-4 weeks following trial of antibiotic therapy to ensure resolution and exclude underlying malignancy. Electronically Signed   By: Inez Catalina M.D.   On: 04/19/2017 16:48    ROS: All others negative except those listed in HPI.  Physical Exam: Vitals:   04/19/17 2000 04/19/17 2030 04/19/17 2214 04/20/17 0435  BP: (!) 159/91 (!) 171/95 (!) 157/89 (!) 141/82  Pulse: 75 75 78 (!) 110  Resp: 18 19 20    Temp:  99.4 F (37.4 C) 99.4 F (37.4 C) 99.2 F (37.3 C)  TempSrc:  Oral Oral Oral  SpO2: 96% 98% 96% 96%  Weight:   87.4 kg (192 lb 9.6 oz)   Height:   6\' 1"  (1.854 m)      General: WDWN NAD Head: NCAT sclera not icteric MMM Neck: Supple. No lymphadenopathy Lungs:LLL: Decreased breath sounds, +rub, +crackles b/l bases. CTA upper lobes b/l. Heart: RRR. No murmur, rubs or gallops.  Abdomen: soft, nontender, +BS, no guarding, no rebound tenderness M/S:  Equal strength b/l in upper and lower extremities.  Lower extremities:no edema, ischemic changes, or open wounds  Neuro: A&Ox3. Moves all extremities spontaneously. Psych:  Responds to questions appropriately with a normal affect. Dialysis Access:  Dialysis Orders:  CCPD - [place], EDW 88kg, #  exchange 6, fill vol &  last fill vol: 2734mL, Dwell time 39min, No Day exchange, Day Dwell time 4hrs  mircera 193mcg sub Q pre HD  Assessment/Plan: 1. LLL PNA with effusion - CXR indicated bibasilar infiltrates, L>R.  Influenza neg, Given prednisone, aztreonam and Vanc (PEN allergy) Per primary and pharm. 2. Hypokalemia - K falling 3.3>2.9. 40 meq KCl now, and again in 8hrs. Repeat RFP in AM. 3. ESRD -  PD patient. Orders written. 4.  Hypertension/volume  - BP elevated. Using 2.5 bags due to slightly increased volume. Monitor.  5.  Anemia  - Hgb 11.5 - no ESA needed. Monitor 6.  Secondary Hyperparathyroidism -  Cor Ca 8.2, monitor. Continue calcitriol and phoslo.  7.  Nutrition - Alb 2.2, Add prostat and Nepro BID, renavite. Heart healthy diet, so K is not limited.  8. DM - on insulin per primary  Jen Mow, PA-C [facility] Pager: [phone] 04/20/2017, 10:34 AM   Pt seen, examined and agree w A/P as above. ESRD pt with L sided chest pain found to have large LLL PNA/ effusion.  Admitted and starting on IV abx.  Vol status is stable, at dry wt, lytes OK.  Plan CCPD starting tonight.  Kelly Splinter MD [employer] pager [phone]   04/20/2017, 4:15 PM

## 2017-04-21 DIAGNOSIS — E876 Hypokalemia: Secondary | ICD-10-CM

## 2017-04-21 DIAGNOSIS — N2581 Secondary hyperparathyroidism of renal origin: Secondary | ICD-10-CM | POA: Diagnosis not present

## 2017-04-21 DIAGNOSIS — D631 Anemia in chronic kidney disease: Secondary | ICD-10-CM | POA: Diagnosis not present

## 2017-04-21 DIAGNOSIS — D630 Anemia in neoplastic disease: Secondary | ICD-10-CM | POA: Diagnosis not present

## 2017-04-21 DIAGNOSIS — D509 Iron deficiency anemia, unspecified: Secondary | ICD-10-CM | POA: Diagnosis not present

## 2017-04-21 DIAGNOSIS — N2589 Other disorders resulting from impaired renal tubular function: Secondary | ICD-10-CM | POA: Diagnosis not present

## 2017-04-21 DIAGNOSIS — N186 End stage renal disease: Secondary | ICD-10-CM | POA: Diagnosis not present

## 2017-04-21 LAB — RENAL FUNCTION PANEL
ALBUMIN: 1.8 g/dL — AB (ref 3.5–5.0)
ANION GAP: 15 (ref 5–15)
BUN: 55 mg/dL — ABNORMAL HIGH (ref 6–20)
CALCIUM: 6.7 mg/dL — AB (ref 8.9–10.3)
CO2: 22 mmol/L (ref 22–32)
Chloride: 95 mmol/L — ABNORMAL LOW (ref 101–111)
Creatinine, Ser: 18.71 mg/dL — ABNORMAL HIGH (ref 0.61–1.24)
GFR, EST AFRICAN AMERICAN: 3 mL/min — AB (ref >60)
GFR, EST NON AFRICAN AMERICAN: 2 mL/min — AB (ref >60)
GLUCOSE: 322 mg/dL — AB (ref 65–99)
PHOSPHORUS: 6.9 mg/dL — AB (ref 2.5–4.6)
Potassium: 3.4 mmol/L — ABNORMAL LOW (ref 3.5–5.1)
SODIUM: 132 mmol/L — AB (ref 135–145)

## 2017-04-21 LAB — CALCIUM, IONIZED: Calcium, Ionized, Serum: 3.5 mg/dL — ABNORMAL LOW (ref 4.5–5.6)

## 2017-04-21 LAB — MRSA PCR SCREENING: MRSA by PCR: POSITIVE — AB

## 2017-04-21 LAB — GLUCOSE, CAPILLARY
GLUCOSE-CAPILLARY: 353 mg/dL — AB (ref 65–99)
GLUCOSE-CAPILLARY: 155 mg/dL — AB (ref 65–99)
GLUCOSE-CAPILLARY: 98 mg/dL (ref 65–99)
GLUCOSE-CAPILLARY: 240 mg/dL — AB (ref 65–99)

## 2017-04-21 MED ORDER — INSULIN GLARGINE 100 UNIT/ML ~~LOC~~ SOLN
10.0000 [IU] | Freq: Every day | SUBCUTANEOUS | Status: DC
  Administered 2017-04-21: 10 [IU] via SUBCUTANEOUS
  Filled 2017-04-21 (×2): qty 0.1

## 2017-04-21 MED ORDER — CHLORHEXIDINE GLUCONATE CLOTH 2 % EX PADS
6.0000 | MEDICATED_PAD | Freq: Every day | CUTANEOUS | Status: DC
  Administered 2017-04-21 – 2017-04-22 (×2): 6 via TOPICAL

## 2017-04-21 MED ORDER — MUPIROCIN 2 % EX OINT
1.0000 "application " | TOPICAL_OINTMENT | Freq: Two times a day (BID) | CUTANEOUS | Status: DC
  Administered 2017-04-21 – 2017-04-22 (×3): 1 via NASAL
  Filled 2017-04-21: qty 22

## 2017-04-21 MED ORDER — POTASSIUM CHLORIDE 20 MEQ PO PACK
40.0000 meq | PACK | Freq: Two times a day (BID) | ORAL | Status: AC
  Administered 2017-04-21 – 2017-04-22 (×2): 40 meq via ORAL
  Filled 2017-04-21 (×2): qty 2

## 2017-04-21 NOTE — Progress Notes (Signed)
Results for SENECA, HOBACK (MRN [id number]) as of 04/21/2017 11:55  Ref. Range 04/20/2017 08:07 04/20/2017 12:16 04/20/2017 17:13 04/20/2017 21:17 04/21/2017 08:09  Glucose-Capillary Latest Ref Range: 65 - 99 mg/dL 157 (H) 207 (H) 230 (H) 182 (H) 353 (H)  Noted that blood sugar this am was greater than 300 mg/dl.  Recommend increasing Lantus to 10 units daily.  May need to increase dosage as needed. Will continue to monitor blood sugars while in the hospital.   Harvel Ricks RN BSN CDE Diabetes Coordinator Pager: [phone]  8am-5pm

## 2017-04-21 NOTE — Progress Notes (Signed)
[facility] Progress Note  Subjective: L chest pain is better, not coughing much.    Vitals:   04/21/17 0515 04/21/17 0729 04/21/17 0800 04/21/17 0941  BP: 136/74  (!) 177/92   Pulse: 79 80 72 77  Resp: 18   17  Temp: 99.1 F (37.3 C)   98.6 F (37 C)  TempSrc: Oral   Oral  SpO2: 96%   96%  Weight:      Height:        Inpatient medications: . calcitRIOL  0.5 mcg Oral Daily  . calcium acetate  2,668 mg Oral TID WC  . carvedilol  6.25 mg Oral BID WC  . Chlorhexidine Gluconate Cloth  6 each Topical Q0600  . enoxaparin (LOVENOX) injection  30 mg Subcutaneous Q24H  . feeding supplement (PRO-STAT SUGAR FREE 64)  30 mL Oral BID  . gentamicin cream  1 application Topical Daily  . insulin aspart  0-5 Units Subcutaneous QHS  . insulin aspart  0-9 Units Subcutaneous TID WC  . insulin glargine  5 Units Subcutaneous QHS  . multivitamin  1 tablet Oral QHS  . mupirocin ointment  1 application Nasal BID  . predniSONE  20 mg Oral Q breakfast   . aztreonam 500 mg (04/21/17 0913)  . dialysis solution 2.5% low-MG/low-CA     acetaminophen **OR** acetaminophen, calcium carbonate (dosed in mg elemental calcium), camphor-menthol **AND** hydrOXYzine, docusate sodium, feeding supplement (NEPRO CARB STEADY), dianeal solution for CAPD/CCPD with heparin, hydrALAZINE, ondansetron **OR** ondansetron (ZOFRAN) IV, sorbitol, zolpidem  Exam: Alert, no distress No jvd Chest bronchial BS L base 1/3 up, o/w clear RRR no mrg Abd soft ntnd PD cath w clean exit site Ext no LE edema NF, ox3  Dialysis: CCPD [facility] > 6 fills, 5 overnight and one day bag, all fill volumes are 2750 cc.  Day dwell is only 4 hrs.  No pauses.        Impression: 1. LLL PNA/ L chest pain - on IV pred, aztreonam and Vanc 2. ESRD on HD 3. Hypokalemia - better 4. DM 5. 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 HTN - bp's up, using all 2.5 %   Plan - per primary.  Cont nightly PD, all 2.5%   Kelly Splinter  MD [facility] pager [phone]   04/21/2017, 10:46 AM    Recent Labs Lab 04/19/17 1655 04/20/17 0417 04/21/17 0612  NA 133* 134* 132*  K 3.3* 2.9* 3.4*  CL 96* 97* 95*  CO2 26 21* 22  GLUCOSE 230* 149* 322*  BUN 46* 46* 55*  CREATININE 17.62* 17.97* 18.71*  CALCIUM 7.0* 6.8* 6.7*  PHOS  --   --  6.9*    Recent Labs Lab 04/19/17 1655 04/21/17 0612  AST 17  --   ALT 22  --   ALKPHOS 89  --   BILITOT 0.5  --   PROT 7.6  --   ALBUMIN 2.2* 1.8*    Recent Labs Lab 04/19/17 1655 04/20/17 0417  WBC 13.8* 17.4*  NEUTROABS  --  16.0*  HGB 11.2* 11.5*  HCT 34.2* 35.1*  MCV 86.4 86.2  PLT 159 172   Iron/TIBC/Ferritin/ %Sat    Component Value Date/Time   IRON 119 03/01/2014 0944   TIBC 200 (L) 03/01/2014 0944   FERRITIN 710 (H) 03/01/2014 0944   IRONPCTSAT 60 (H) 03/01/2014 2774

## 2017-04-21 NOTE — Progress Notes (Signed)
PROGRESS NOTE   Janice Seales  VQQ:[id number]    DOB: 05/03/1966    DOA: 04/19/2017  PCP: [name], MD   I have briefly reviewed patients previous medical records in [facility].  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 Exposed to family member with respiratory illness. In ED, hypoxic to 82% on room air, chest x-ray showed findings suggestive of left-sided pneumonia with associated pleural effusion. Admitted for suspected HCAP. Nephrology consulted for dialysis needs.   Assessment & Plan:   Principal Problem:   Pneumonia Active Problems:   CHF (congestive heart failure), NYHA class II (HCC)   Essential hypertension, benign   Anemia   ESRD on dialysis ([place])   Hypocalcemia   Diabetes mellitus due to underlying condition, uncontrolled, with stage 4 chronic kidney disease, with long-term current use of insulin (HCC)   Suspected left lobar (LLL) pneumonia/HCAP with pleural effusion/pleuritic chest pain - HIV screen nonreactive. Pro-calcitonin 1.35. Influenza panel PCR negative. Blood cultures 2: Negative to date. - Continue empirically started on aztreonam, vancomycin (penicillin allergy) and prednisone. - Recommend repeating chest x-ray in 4 weeks to ensure resolution of pneumonia findings. - Clinically improved. - Due to patient's atypical presentation (only had chest pain and dyspnea but no cough or fevers), discussed in detail with nephrology and will get CT chest without contrast to further evaluate. Patient agreeable.  Acute respiratory failure with hypoxia - Secondary to pneumonia, pleural effusion and pleuritic chest pain. Wean off of oxygen as tolerated. Discussed with RN.  Hypokalemia - Better than yesterday. Replace and follow BMP in  a.m.  ESRD on CCPD - Nephrology consulted for dialysis needs. Discussed with nephrology. Dialysis ongoing. Stable.  Essential hypertension - Mildly uncontrolled and fluctuating at times. Continue carvedilol. No change.  Anemia - Secondary to chronic kidney disease. Stable  Type I DM - Home insulin regimen changed to Lantus and SSI while hospitalized. Uncontrolled and fluctuating. Precipitated by prednisone. Limited prednisone use to 3 days and stop after 10/19 dose. Increase Lantus to 10 units daily at bedtime. Follow closely.  Secondary hyperparathyroidism  - management per nephrology.  Elevated troponin - Minimal with flat trend. Likely related to ESRD rather than ACS.  Chronic diastolic CHF  - Does not appear volume overloaded.    DVT prophylaxis: Lovenox Code Status: Full Family Communication: None at bedside Disposition: DC home in 48-72 hours.    Consultants:  Nephrology    Procedures:  CCPD per nephrology.  Antimicrobials:  Aztreonam and vancomycin per pharmacy    Subjective: Continues to feel better. No recurrence of chest pain or dyspnea. Denies cough or fever through the course of this illness. No other complaints reported.   ROS: As above  Objective:  Vitals:   04/21/17 0941 04/21/17 1100 04/21/17 1300 04/21/17 1400  BP:  (!) 143/82 (!) 146/83   Pulse: 77 79 80   Resp: 17  18   Temp: 98.6 F (37 C)  99.5 F (37.5 C)   TempSrc: Oral  Oral   SpO2: 96%  91% 98%  Weight:      Height:        Examination:  General exam: Pleasant young male, moderately built and nourished, lying comfortably supine in bed. Stable. Respiratory system: reduced breath sounds in left base  with occasional crackles but otherwise clear to auscultation. Respiratory effort normal. Not much change on physical exam compared to yesterday. Cardiovascular system: S1 & S2 heard, RRR. No JVD, murmurs, rubs, gallops or clicks. No pedal edema. Stable. Gastrointestinal system:  Abdomen is nondistended, soft and nontender. No organomegaly or masses felt. Normal bowel sounds heard. PD catheter site intact without acute findings. Stable. Central nervous system: Alert and oriented. No focal neurological deficits. Stable. Extremities: Symmetric 5 x 5 power. Skin: No rashes, lesions or ulcers Psychiatry: Judgement and insight appear normal. Mood & affect appropriate.     Data Reviewed: I have personally reviewed following labs and imaging studies  CBC:  Recent Labs Lab 04/19/17 1655 04/20/17 0417  WBC 13.8* 17.4*  NEUTROABS  --  16.0*  HGB 11.2* 11.5*  HCT 34.2* 35.1*  MCV 86.4 86.2  PLT 159 161   Basic Metabolic Panel:  Recent Labs Lab 04/19/17 1655 04/20/17 0417 04/21/17 0612  NA 133* 134* 132*  K 3.3* 2.9* 3.4*  CL 96* 97* 95*  CO2 26 21* 22  GLUCOSE 230* 149* 322*  BUN 46* 46* 55*  CREATININE 17.62* 17.97* 18.71*  CALCIUM 7.0* 6.8* 6.7*  PHOS  --   --  6.9*   Liver Function Tests:  Recent Labs Lab 04/19/17 1655 04/21/17 0612  AST 17  --   ALT 22  --   ALKPHOS 89  --   BILITOT 0.5  --   PROT 7.6  --   ALBUMIN 2.2* 1.8*   Cardiac Enzymes:  Recent Labs Lab 04/19/17 1655 04/20/17 0029 04/20/17 0417 04/20/17 1104  TROPONINI 0.05* 0.04* 0.17* 0.04*   CBG:  Recent Labs Lab 04/20/17 1216 04/20/17 1713 04/20/17 2117 04/21/17 0809 04/21/17 1219  GLUCAP 207* 230* 182* 353* 155*    Recent Results (from the past 240 hour(s))  Culture, blood (routine x 2) Call MD if unable to obtain prior to antibiotics being given     Status: None (Preliminary result)   Collection Time: 04/20/17 12:29 AM  Result Value Ref Range Status   Specimen Description BLOOD BLOOD LEFT FOREARM  Final   Special Requests   Final    BOTTLES DRAWN AEROBIC AND ANAEROBIC Blood Culture adequate volume   Culture NO GROWTH 1 DAY  Final   Report Status PENDING  Incomplete  Culture, blood (routine x 2) Call MD if unable to obtain prior to antibiotics being  given     Status: None (Preliminary result)   Collection Time: 04/20/17 12:31 AM  Result Value Ref Range Status   Specimen Description BLOOD RIGHT HAND  Final   Special Requests IN PEDIATRIC BOTTLE Blood Culture adequate volume  Final   Culture NO GROWTH 1 DAY  Final   Report Status PENDING  Incomplete  MRSA PCR Screening     Status: Abnormal   Collection Time: 04/20/17  7:48 PM  Result Value Ref Range Status   MRSA by PCR POSITIVE (A) NEGATIVE Final    Comment:        The GeneXpert MRSA Assay (FDA approved for NASAL specimens only), is one component of a comprehensive MRSA colonization surveillance program. It is not intended to diagnose MRSA infection nor to guide or monitor treatment for MRSA infections. RESULT CALLED TO, READ BACK BY AND VERIFIED WITH: C.TULIAO RN 04/21/17 0218 L.CHAMPION          Radiology Studies: Dg Chest 2 View  Result Date: 04/19/2017 CLINICAL DATA:  Worsening left chest pain over the past few  days EXAM: CHEST  2 VIEW COMPARISON:  01/13/2017 FINDINGS: Cardiac shadow is mildly enlarged. Previously seen dialysis catheter has been removed. Upper extremity stenting on the left is noted. Mild right basilar atelectasis is noted. More marked left basilar infiltrate with associated effusion is seen. This would correspond with the patient's given clinical history. IMPRESSION: Bibasilar changes worse on the left than the right with associated effusion. Followup PA and lateral chest X-ray is recommended in 3-4 weeks following trial of antibiotic therapy to ensure resolution and exclude underlying malignancy. Electronically Signed   By: Inez Catalina M.D.   On: 04/19/2017 16:48        Scheduled Meds: . calcitRIOL  0.5 mcg Oral Daily  . calcium acetate  2,668 mg Oral TID WC  . carvedilol  6.25 mg Oral BID WC  . Chlorhexidine Gluconate Cloth  6 each Topical Q0600  . enoxaparin (LOVENOX) injection  30 mg Subcutaneous Q24H  . feeding supplement (PRO-STAT SUGAR  FREE 64)  30 mL Oral BID  . gentamicin cream  1 application Topical Daily  . insulin aspart  0-5 Units Subcutaneous QHS  . insulin aspart  0-9 Units Subcutaneous TID WC  . insulin glargine  5 Units Subcutaneous QHS  . multivitamin  1 tablet Oral QHS  . mupirocin ointment  1 application Nasal BID  . potassium chloride  40 mEq Oral BID  . predniSONE  20 mg Oral Q breakfast   Continuous Infusions: . aztreonam 500 mg (04/21/17 1609)  . dialysis solution 2.5% low-MG/low-CA       LOS: 2 days     Mateya Torti, MD, FACP, FHM. [facility] Pager [phone]  If 7PM-7AM, please contact night-coverage [email or web address] Password [email or web address] 04/21/2017, 4:30 PM

## 2017-04-22 ENCOUNTER — Encounter (HOSPITAL_COMMUNITY): Payer: Self-pay | Admitting: *Deleted

## 2017-04-22 ENCOUNTER — Inpatient Hospital Stay (HOSPITAL_COMMUNITY): Payer: Medicare Other

## 2017-04-22 DIAGNOSIS — D630 Anemia in neoplastic disease: Secondary | ICD-10-CM | POA: Diagnosis not present

## 2017-04-22 DIAGNOSIS — N2581 Secondary hyperparathyroidism of renal origin: Secondary | ICD-10-CM | POA: Diagnosis not present

## 2017-04-22 DIAGNOSIS — D509 Iron deficiency anemia, unspecified: Secondary | ICD-10-CM | POA: Diagnosis not present

## 2017-04-22 DIAGNOSIS — J90 Pleural effusion, not elsewhere classified: Secondary | ICD-10-CM

## 2017-04-22 DIAGNOSIS — N186 End stage renal disease: Secondary | ICD-10-CM | POA: Diagnosis not present

## 2017-04-22 DIAGNOSIS — J918 Pleural effusion in other conditions classified elsewhere: Secondary | ICD-10-CM

## 2017-04-22 DIAGNOSIS — D631 Anemia in chronic kidney disease: Secondary | ICD-10-CM | POA: Diagnosis not present

## 2017-04-22 DIAGNOSIS — N2589 Other disorders resulting from impaired renal tubular function: Secondary | ICD-10-CM | POA: Diagnosis not present

## 2017-04-22 LAB — GLUCOSE, CAPILLARY
GLUCOSE-CAPILLARY: 456 mg/dL — AB (ref 65–99)
GLUCOSE-CAPILLARY: 184 mg/dL — AB (ref 65–99)

## 2017-04-22 LAB — VANCOMYCIN, RANDOM: Vancomycin Rm: 22

## 2017-04-22 LAB — HEPATITIS B SURFACE ANTIBODY,QUALITATIVE: Hep B S Ab: REACTIVE

## 2017-04-22 MED ORDER — DOXYCYCLINE HYCLATE 100 MG PO TABS: 100.0000 mg | ORAL_TABLET | Freq: Two times a day (BID) | ORAL | 0 refills | Status: DC

## 2017-04-22 MED ORDER — CALCITRIOL 0.25 MCG PO CAPS: 0.5000 ug | ORAL_CAPSULE | Freq: Every day | ORAL | 0 refills | Status: AC

## 2017-04-22 NOTE — Progress Notes (Signed)
Pharmacy Antibiotic Note  Bryan Wilkerson is a 51 y.o. male with h/o ESRD on peritoneal dialysis admitted on 04/19/2017 with pneumonia.  Pharmacy has been consulted for Vancomycin dosing.  Random vancomycin level was 22 this morning, goal <20 mcg/ml.   Plan: Vancomycin 1750 mg IV given 10/17 F/U random vancomycin level in am and re-dose once <20 mcg/ml   Height: 6\' 1"  (185.4 cm) Weight: 186 lb 15.2 oz (84.8 kg) IBW/kg (Calculated) : 79.9  Temp (24hrs), Avg:98.3 F (36.8 C), Min:97.8 F (36.6 C), Max:99.5 F (37.5 C)   Recent Labs Lab 04/19/17 1655 04/20/17 0417 04/21/17 0612 04/22/17 0548  WBC 13.8* 17.4*  --   --   CREATININE 17.62* 17.97* 18.71*  --   VANCORANDOM  --   --   --  22    Estimated Creatinine Clearance: 5.3 mL/min (A) (by C-G formula based on SCr of 18.71 mg/dL (H)).    Allergies  Allergen Reactions  . Penicillins Other (See Comments)    UNSPECIFIED REACTION FROM CHILDHOOD Has patient had a PCN reaction causing immediate rash, facial/tongue/throat swelling, SOB or lightheadedness with hypotension:Yes Has patient had a PCN reaction causing severe rash involving mucus membranes or skin necrosis:No Has patient had a PCN reaction that required hospitalization:Yes Has patient had a PCN reaction occurring within the last 10 years:No If all of the above answers are "NO", then may proceed with Cephalosporin use.      Renold Genta, PharmD, BCPS Clinical Pharmacist Phone for today - [place] - [phone] 04/22/2017 12:42 PM

## 2017-04-22 NOTE — Progress Notes (Addendum)
Dialysis RN on duty, Debbe Bales was made aware that patient's peritoneal dialysis is on-going and will be completed around 0530.  PD machine and hook up should be removed before 7am since patient is scheduled for CT chest early morning.  Will endorse to day shift RN appropriately.

## 2017-04-22 NOTE — Discharge Instructions (Signed)

## 2017-04-22 NOTE — Progress Notes (Signed)
Discharge home. Home discharge instryuction given, no question verbalized.

## 2017-04-22 NOTE — Discharge Summary (Addendum)
Physician Discharge Summary  Bryan Wilkerson ZJQ:[id number] DOB: [birth year]  PCP: Bryan Mclean, MD  Admit date: 04/19/2017 Discharge date: 04/22/2017  Recommendations for Outpatient Follow-up:  1. Dr. Lamar Wilkerson, PCP in one week with repeat labs (CBC & BMP). Please follow final blood culture results that were sent from the hospital. 2. Dr. Tera Wilkerson, Pulmonology on 04/27/17 at 10 AM. 3. Dr. Edrick Wilkerson, Nephrology  Home Health: None Equipment/Devices: None    Discharge Condition: Improved and stable  CODE STATUS: Full  Diet recommendation: Heart healthy and diabetic diet.  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 Exposed to family member with respiratory illness. In ED, hypoxic to 82% on room air, chest x-ray showed findings suggestive of left-sided pneumonia with associated pleural effusion. Admitted for suspected HCAP. Nephrology consulted for dialysis needs.   Assessment & Plan:   Suspected left lobar (LLL) pneumonia/HCAP with pleural effusion/pleuritic chest pain - HIV screen nonreactive. Pro-calcitonin 1.35. Influenza panel PCR negative. Blood cultures 2: Negative to date. - Treated empirically with aztreonam, vancomycin (penicillin allergy) and prednisone. - Recommend repeating chest x-ray in 4 weeks to ensure resolution of pneumonia findings. - Clinically improved. - Due to patient's  atypical presentation (only had chest pain and dyspnea but no cough or fevers), obtained CT chest without contrast to further evaluate.  - CT chest without contrast showed moderate left pleural effusion with compressive atelectasis and near complete left lower lobe collapse. Pulmonology was consulted. Diagnostic and therapeutic thoracentesis was offered but patient declines at this time. The pleural effusion is felt to be parapneumonic but cannot rule out CHF and/or hemodialysis fluid from peritoneal HD. I discussed with Dr. Creig Wilkerson, Pulmonology who recommended discharging patient on oral doxycycline to complete total 10 days course of antibiotics and has arranged for close outpatient follow-up.  Acute respiratory failure with hypoxia - Secondary to pneumonia, pleural effusion and pleuritic chest pain. Resolved.  Hypokalemia - Improved after replacement. Periodically follow BMP as outpatient.  ESRD on CCPD - Nephrology consulted for dialysis needs. I discussed with Nephrology regarding discharge plans and they are agreeable.  Essential hypertension - Mildly uncontrolled and fluctuating at times. Continue carvedilol. Resume amlodipine at discharge.  Anemia - Secondary to chronic kidney disease. Stable  Type I DM - Home insulin regimen changed to Lantus and SSI while hospitalized. Uncontrolled and fluctuating. Precipitated by prednisone. Prednisone was discontinued after 2 doses. Patient states that he monitors his CBGs at home with every meal and it usually fluctuates between 110-150 mg/dL. He seems to be well educated regarding management of home insulin (70/30) based upon sliding scale and follows Dr. Chalmers Wilkerson, Endocrinology. CBG had increased to 456 this morning but is back down to 180. Expect continued improvement now that steroids have been discontinued. Patient's leukocytosis is also likely related to steroids.  Secondary hyperparathyroidism  - management per nephrology.  Elevated  troponin - Minimal with flat trend. Likely related to ESRD rather than  ACS.  Chronic diastolic CHF  - Does not appear volume overloaded.   Psoriasis  - Patient states that Delsa Grana has been discontinued and his physicians are checking with insurance to transitioning him to some other medication.    Consultants:  Nephrology   Pulmonology  Procedures:  CCPD per nephrology.   Discharge Instructions  Discharge Instructions    Call MD for:  difficulty breathing, headache or visual disturbances    Complete by:  As directed    Call MD for:  extreme fatigue    Complete by:  As directed    Call MD for:  persistant dizziness or light-headedness    Complete by:  As directed    Call MD for:  severe uncontrolled pain    Complete by:  As directed    Call MD for:  temperature >100.4    Complete by:  As directed    Diet - low sodium heart healthy    Complete by:  As directed    Diet Carb Modified    Complete by:  As directed    Increase activity slowly    Complete by:  As directed        Medication List    STOP taking these medications   clobetasol cream 0.05 % Commonly known as:  TEMOVATE   divalproex 500 MG DR tablet Commonly known as:  DEPAKOTE   STELARA 45 MG/0.5ML Sosy injection Generic drug:  ustekinumab     TAKE these medications   amLODipine 5 MG tablet Commonly known as:  NORVASC Take 5 mg by mouth daily.   calcitRIOL 0.25 MCG capsule Commonly known as:  ROCALTROL Take 2 capsules (0.5 mcg total) by mouth daily. What changed:  how much to take   carvedilol 6.25 MG tablet Commonly known as:  COREG Take 1 tablet (6.25 mg total) by mouth 2 (two) times daily with a meal.   doxycycline 100 MG tablet Commonly known as:  VIBRA-TABS Take 1 tablet (100 mg total) by mouth 2 (two) times daily.   insulin NPH-regular Human (70-30) 100 UNIT/ML injection Commonly known as:  NOVOLIN 70/30 Inject 4 Units into the skin daily.   NEPHRO-VITE PO Take 1 tablet by  mouth every morning.   triamcinolone ointment 0.1 % Commonly known as:  KENALOG Apply 1 application topically as needed (psorasis).   VELPHORO 500 MG chewable tablet Generic drug:  sucroferric oxyhydroxide Chew 500 mg by mouth 3 (three) times daily with meals.      Follow-up Information    Javier Glazier, MD Follow up on 04/27/2017.   Specialty:  Pulmonary Disease Why:  10 am. Contact information: [address] [place] [address] [phone]        Copland, Gay Filler, MD. Schedule an appointment as soon as possible for a visit in 1 week(s).   Specialty:  Family Medicine Why:  To be seen with repeat labs (CBC & BMP). Contact information: [address] [place] [address] [phone]        Bryan Oh, MD. Schedule an appointment as soon as possible for a visit.   Specialty:  Nephrology Contact information: [place] [address] [phone]          Allergies  Allergen Reactions  . Penicillins Other (See Comments)    UNSPECIFIED REACTION FROM CHILDHOOD Has patient had a PCN reaction causing immediate rash, facial/tongue/throat swelling, SOB or lightheadedness with hypotension:Yes Has patient had a PCN reaction causing severe  rash involving mucus membranes or skin necrosis:No Has patient had a PCN reaction that required hospitalization:Yes Has patient had a PCN reaction occurring within the last 10 years:No If all of the above answers are "NO", then may proceed with Cephalosporin use.        Procedures/Studies: Dg Chest 2 View  Result Date: 04/19/2017 CLINICAL DATA:  Worsening left chest pain over the past few days EXAM: CHEST  2 VIEW COMPARISON:  01/13/2017 FINDINGS: Cardiac shadow is mildly enlarged. Previously seen dialysis catheter has been removed. Upper extremity stenting on the left is noted. Mild right basilar atelectasis is noted. More marked left basilar infiltrate with associated effusion is  seen. This would correspond with the patient's given clinical history. IMPRESSION: Bibasilar changes worse on the left than the right with associated effusion. Followup PA and lateral chest X-ray is recommended in 3-4 weeks following trial of antibiotic therapy to ensure resolution and exclude underlying malignancy. Electronically Signed   By: Inez Catalina M.D.   On: 04/19/2017 16:48   Ct Chest Wo Contrast  Result Date: 04/22/2017 CLINICAL DATA:  Shortness of breath.  On dialysis. EXAM: CT CHEST WITHOUT CONTRAST TECHNIQUE: Multidetector CT imaging of the chest was performed following the standard protocol without IV contrast. COMPARISON:  Chest radiographs 04/19/2017 FINDINGS: Cardiovascular: Normal caliber of the thoracic aorta with minimal calcified atherosclerotic plaque. Three-vessel coronary artery atherosclerosis. Mild cardiac enlargement. Trace pericardial effusion. Right axillary vascular stent. Mediastinum/Nodes: No enlarged axillary, mediastinal, or hilar lymph nodes. Unremarkable thyroid and esophagus. Lungs/Pleura: Moderate-sized left pleural effusion with compressive atelectasis in the left lower lobe and lingula. The left lower lobe is nearly completely collapsed. No right pleural effusion. Subsegmental atelectasis in the right middle lobe and right lower lobe. Upper Abdomen: Calcified granulomas in the spleen. Small volume ascites in the visualized upper abdomen and small volume pneumoperitoneum consistent with history of peritoneal dialysis. Musculoskeletal: No acute osseous abnormality or suspicious osseous lesion. IMPRESSION: 1. Moderate left pleural effusion with compressive atelectasis. Near complete left lower lobe collapse. 2. Aortic Atherosclerosis (ICD10-I70.0) and three-vessel coronary artery atherosclerosis. Electronically Signed   By: Logan Bores M.D.   On: 04/22/2017 07:32      Subjective: Seen this morning. Denies complaints. Denies dyspnea or chest pain even with activity.  As per RN, not hypoxic.  Discharge Exam:  Vitals:   04/22/17 0500 04/22/17 0516 04/22/17 0625 04/22/17 1350  BP:  135/83 (!) 158/110 125/74  Pulse:  75 76 71  Resp:  18 18 16   Temp:  97.9 F (36.6 C) 97.8 F (36.6 C)   TempSrc:   Oral   SpO2:  92% 98% 92%  Weight: 85.8 kg (189 lb 2.5 oz)  84.8 kg (186 lb 15.2 oz)   Height:        General exam: Pleasant young male, moderately built and nourished, lying comfortably supine in bed.  Respiratory system: reduced breath sounds in left base but no crackles and otherwise clear to auscultation. Respiratory effort normal.  Cardiovascular system: S1 & S2 heard, RRR. No JVD, murmurs, rubs, gallops or clicks. No pedal edema. . Gastrointestinal system: Abdomen is nondistended, soft and nontender. No organomegaly or masses felt. Normal bowel sounds heard. PD catheter site intact without acute findings.  Central nervous system: Alert and oriented. No focal neurological deficits.  Extremities: Symmetric 5 x 5 power. Skin: No rashes, lesions or ulcers Psychiatry: Judgement and insight appear normal. Mood & affect appropriate.    The results of significant diagnostics from this hospitalization (  including imaging, microbiology, ancillary and laboratory) are listed below for reference.     Microbiology: Recent Results (from the past 240 hour(s))  Culture, blood (routine x 2) Call MD if unable to obtain prior to antibiotics being given     Status: None (Preliminary result)   Collection Time: 04/20/17 12:29 AM  Result Value Ref Range Status   Specimen Description BLOOD BLOOD LEFT FOREARM  Final   Special Requests   Final    BOTTLES DRAWN AEROBIC AND ANAEROBIC Blood Culture adequate volume   Culture NO GROWTH 1 DAY  Final   Report Status PENDING  Incomplete  Culture, blood (routine x 2) Call MD if unable to obtain prior to antibiotics being given     Status: None (Preliminary result)   Collection Time: 04/20/17 12:31 AM  Result Value Ref Range  Status   Specimen Description BLOOD RIGHT HAND  Final   Special Requests IN PEDIATRIC BOTTLE Blood Culture adequate volume  Final   Culture NO GROWTH 1 DAY  Final   Report Status PENDING  Incomplete  MRSA PCR Screening     Status: Abnormal   Collection Time: 04/20/17  7:48 PM  Result Value Ref Range Status   MRSA by PCR POSITIVE (A) NEGATIVE Final    Comment:        The GeneXpert MRSA Assay (FDA approved for NASAL specimens only), is one component of a comprehensive MRSA colonization surveillance program. It is not intended to diagnose MRSA infection nor to guide or monitor treatment for MRSA infections. RESULT CALLED TO, READ BACK BY AND VERIFIED WITH: C.TULIAO RN 04/21/17 0218 L.CHAMPION      Labs: CBC:  Recent Labs Lab 04/19/17 1655 04/20/17 0417  WBC 13.8* 17.4*  NEUTROABS  --  16.0*  HGB 11.2* 11.5*  HCT 34.2* 35.1*  MCV 86.4 86.2  PLT 159 701   Basic Metabolic Panel:  Recent Labs Lab 04/19/17 1655 04/20/17 0417 04/21/17 0612  NA 133* 134* 132*  K 3.3* 2.9* 3.4*  CL 96* 97* 95*  CO2 26 21* 22  GLUCOSE 230* 149* 322*  BUN 46* 46* 55*  CREATININE 17.62* 17.97* 18.71*  CALCIUM 7.0* 6.8* 6.7*  PHOS  --   --  6.9*   Liver Function Tests:  Recent Labs Lab 04/19/17 1655 04/21/17 0612  AST 17  --   ALT 22  --   ALKPHOS 89  --   BILITOT 0.5  --   PROT 7.6  --   ALBUMIN 2.2* 1.8*   Cardiac Enzymes:  Recent Labs Lab 04/19/17 1655 04/20/17 0029 04/20/17 0417 04/20/17 1104  TROPONINI 0.05* 0.04* 0.17* 0.04*   CBG:  Recent Labs Lab 04/21/17 1219 04/21/17 1719 04/21/17 2050 04/22/17 0814 04/22/17 1305  GLUCAP 155* 98 240* 456* 184*     Time coordinating discharge: Over 30 minutes  SIGNED:  Vernell Leep, MD, FACP, FHM. [facility] Pager [phone]  If 7PM-7AM, please contact night-coverage [email or web address] Password [email or web address] 04/22/2017, 2:24 PM

## 2017-04-22 NOTE — Progress Notes (Signed)
[facility] Progress Note  Subjective:  CT chest showed > moderate L pleural effusion, w LLL collapse.  Patient says he feels much better, pain is gone, SOB is gone, he can walk around w/o severe L CP, etc.. Vitals:   04/21/17 2054 04/22/17 0500 04/22/17 0516 04/22/17 0625  BP: (!) 147/84  135/83 (!) 158/110  Pulse: 74  75 76  Resp: 18  18 18   Temp: 98 F (36.7 C)  97.9 F (36.6 C) 97.8 F (36.6 C)  TempSrc: Oral   Oral  SpO2: 94%  92% 98%  Weight: 90.9 kg (200 lb 6.4 oz) 85.8 kg (189 lb 2.5 oz)  84.8 kg (186 lb 15.2 oz)  Height:        Inpatient medications: . calcitRIOL  0.5 mcg Oral Daily  . calcium acetate  2,668 mg Oral TID WC  . carvedilol  6.25 mg Oral BID WC  . Chlorhexidine Gluconate Cloth  6 each Topical Q0600  . enoxaparin (LOVENOX) injection  30 mg Subcutaneous Q24H  . feeding supplement (PRO-STAT SUGAR FREE 64)  30 mL Oral BID  . gentamicin cream  1 application Topical Daily  . insulin aspart  0-5 Units Subcutaneous QHS  . insulin aspart  0-9 Units Subcutaneous TID WC  . insulin glargine  10 Units Subcutaneous QHS  . multivitamin  1 tablet Oral QHS  . mupirocin ointment  1 application Nasal BID  . potassium chloride  40 mEq Oral BID  . predniSONE  20 mg Oral Q breakfast   . aztreonam 500 mg (04/22/17 0838)  . dialysis solution 2.5% low-MG/low-CA     acetaminophen **OR** acetaminophen, calcium carbonate (dosed in mg elemental calcium), camphor-menthol **AND** hydrOXYzine, docusate sodium, feeding supplement (NEPRO CARB STEADY), dianeal solution for CAPD/CCPD with heparin, hydrALAZINE, ondansetron **OR** ondansetron (ZOFRAN) IV, sorbitol, zolpidem  Exam: Alert, no distress No jvd Chest bronchial BS L base 1/3 up, o/w clear RRR no mrg Abd soft ntnd PD cath w clean exit site Ext no LE edema NF, ox3  Dialysis: CCPD [facility] > 6 fills, 5 overnight and one day bag, all fill volumes are 2750 cc.  Day dwell is only 4 hrs.  No pauses.         Impression: 1. LLL effusion , possible PNA / L chest pain - on IV pred, aztreonam and Vanc. CT scan done. LG temps/ ^WBC/ pleuritic CP responding to IV abx, argues for PNA.  Feeling much better.  Wants to go home.   2. ESRD on CCPD. Cont CCPD, all 2.5% for now.  3. Hypokalemia - better 4. DM2 5. 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 HTN - bp's up, using all 2.5 % 8. Dispo - per primary   Plan - as above   Kelly Splinter MD [facility] pager [phone]   04/22/2017, 8:48 AM    Recent Labs Lab 04/19/17 1655 04/20/17 0417 04/21/17 0612  NA 133* 134* 132*  K 3.3* 2.9* 3.4*  CL 96* 97* 95*  CO2 26 21* 22  GLUCOSE 230* 149* 322*  BUN 46* 46* 55*  CREATININE 17.62* 17.97* 18.71*  CALCIUM 7.0* 6.8* 6.7*  PHOS  --   --  6.9*    Recent Labs Lab 04/19/17 1655 04/21/17 0612  AST 17  --   ALT 22  --   ALKPHOS 89  --   BILITOT 0.5  --   PROT 7.6  --   ALBUMIN  2.2* 1.8*    Recent Labs Lab 04/19/17 1655 04/20/17 0417  WBC 13.8* 17.4*  NEUTROABS  --  16.0*  HGB 11.2* 11.5*  HCT 34.2* 35.1*  MCV 86.4 86.2  PLT 159 172   Iron/TIBC/Ferritin/ %Sat    Component Value Date/Time   IRON 119 03/01/2014 0944   TIBC 200 (L) 03/01/2014 0944   FERRITIN 710 (H) 03/01/2014 0944   IRONPCTSAT 60 (H) 03/01/2014 9924

## 2017-04-22 NOTE — Consult Note (Signed)
Name: Bryan Wilkerson MRN: [id number] DOB: 07-15-1965    ADMISSION DATE:  04/19/2017 CONSULTATION DATE:  10/19  REFERRING MD :  [facility]  CHIEF COMPLAINT:  Pleuritic left chest pain  BRIEF PATIENT DESCRIPTION: 51 yo male in no distress   SIGNIFICANT EVENTS  10/16 admitted with fever, chest pain, hypoxia and malaise   STUDIES:  10/19 ct chest as noted 10/19 [place] pictures of left moderate effsion   HISTORY OF PRESENT ILLNESS:   51 yo with complaints of left pleuritic chest pain , onset 10/12, chest pain worse with coughing, deep breathing and movement. He was admitted 10/16 for presumed pna and left pleural effusion. He denies fever chills sweats or purulent sputum. He was hypoxic with sat in 80's in ED. He has ESRD and is on home peritoneal dialysis, noted to CHF, HTN, anemia,Szs, DM and psoriasis. Note he was exposed to resp illness at home. Currently 10/19 he is on room air, ambulates in hallway, no further chest pain or hypoxia. CT scan of chest 10/19  Reveals left moderate pleural effusion and PCCM asked to evaluate.  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 He does not want a thoracentesis at this time but will consider it and converse with his wife.  PAST MEDICAL HISTORY :   has a past medical history of Anemia; CHF (congestive heart failure) ([place]); Diabetic retinopathy ([place]); ESRD on peritoneal dialysis ([place]); Hypertension; Pneumonia (2016; 04/19/2017); Psoriasis; and Type I diabetes mellitus ([place]).  has a past surgical history that includes Cataract extraction w/ intraocular lens  implant, bilateral (Bilateral); AV fistula placement (Right, 12/05/2013); Fistulogram (Right, 07/16/2014); Ligation of competing branches of arteriovenous fistula (Right, 07/16/2014); AV fistula placement (Left, 07/20/2016); Retinal detachment surgery (Right); PORTA CATH INSERTION (Right); Peritoneal catheter insertion (Left, ~ 08/2016); Eye surgery; and Port-a-cath removal (2017). Prior to  Admission medications   Medication Sig Start Date End Date Taking? Authorizing Provider  amLODipine (NORVASC) 5 MG tablet Take 5 mg by mouth daily. 03/24/17  Yes [provider]  B Complex-C-Folic Acid (NEPHRO-VITE PO) Take 1 tablet by mouth every morning.   Yes [provider]  calcitRIOL (ROCALTROL) 0.25 MCG capsule Take 1 capsule (0.25 mcg total) by mouth daily. 11/28/16  Yes Hosie Poisson, MD  carvedilol (COREG) 6.25 MG tablet Take 1 tablet (6.25 mg total) by mouth 2 (two) times daily with a meal. 11/27/16  Yes Hosie Poisson, MD  clobetasol cream (TEMOVATE) 7.67 % Apply 1 application topically as needed. 04/04/16  Yes [provider]  insulin NPH-regular Human (NOVOLIN 70/30) (70-30) 100 UNIT/ML injection Inject 4 Units into the skin daily.    Yes [provider]  sucroferric oxyhydroxide (VELPHORO) 500 MG chewable tablet Chew 500 mg by mouth 3 (three) times daily with meals.   Yes [provider]  triamcinolone ointment (KENALOG) 0.1 % Apply 1 application topically as needed (psorasis).  10/10/13  Yes [provider]  divalproex (DEPAKOTE) 500 MG DR tablet Take 1 tablet (500 mg total) by mouth every 8 (eight) hours. Patient not taking: Reported on 01/13/2017 11/27/16   Hosie Poisson, MD  STELARA 45 MG/0.5ML SOSY Inject 45 mg into the skin See admin instructions. EVERY 12 WEEKS 07/04/14   [provider]   Allergies  Allergen Reactions  . Penicillins Other (See Comments)    UNSPECIFIED REACTION FROM CHILDHOOD Has patient had a PCN reaction causing immediate rash, facial/tongue/throat swelling, SOB or lightheadedness with hypotension:Yes Has patient had a PCN reaction causing severe  rash involving mucus membranes or skin necrosis:No Has patient had a PCN reaction that required hospitalization:Yes Has patient had a PCN reaction occurring within the last 10 years:No If all of the above answers are "NO", then may proceed with Cephalosporin  use.      FAMILY HISTORY:  family history includes Heart attack in his mother; Hypertension in his mother. SOCIAL HISTORY:  reports that he has never smoked. He has never used smokeless tobacco. He reports that he does not drink alcohol or use drugs.  REVIEW OF SYSTEMS:   10 point review of system taken, please see HPI for positives and negatives.   SUBJECTIVE:  b  VITAL SIGNS: Temp:  [97.8 F (36.6 C)-99.5 F (37.5 C)] 97.8 F (36.6 C) (10/19 0625) Pulse Rate:  [74-80] 76 (10/19 0625) Resp:  [18] 18 (10/19 0625) BP: (135-158)/(83-110) 158/110 (10/19 0625) SpO2:  [91 %-98 %] 98 % (10/19 0625) Weight:  [186 lb 15.2 oz (84.8 kg)-200 lb 6.4 oz (90.9 kg)] 186 lb 15.2 oz (84.8 kg) (10/19 8453)  PHYSICAL EXAMINATION: General:  WNWDWWM in no distress Neuro:  Inatct HEENT:  No JVD/LAN Cardiovascular:  HSR RRR Lungs:  Decreased left base with dull to percussion left base Abdomen: PD cath unremakable Musculoskeletal:  intact Skin:  Multiple psoriasis lesions in various stages of inflamtion.    Recent Labs Lab 04/19/17 1655 04/20/17 0417 04/21/17 0612  NA 133* 134* 132*  K 3.3* 2.9* 3.4*  CL 96* 97* 95*  CO2 26 21* 22  BUN 46* 46* 55*  CREATININE 17.62* 17.97* 18.71*  GLUCOSE 230* 149* 322*    Recent Labs Lab 04/19/17 1655 04/20/17 0417  HGB 11.2* 11.5*  HCT 34.2* 35.1*  WBC 13.8* 17.4*  PLT 159 172   Ct Chest Wo Contrast  Result Date: 04/22/2017 CLINICAL DATA:  Shortness of breath.  On dialysis. EXAM: CT CHEST WITHOUT CONTRAST TECHNIQUE: Multidetector CT imaging of the chest was performed following the standard protocol without IV contrast. COMPARISON:  Chest radiographs 04/19/2017 FINDINGS: Cardiovascular: Normal caliber of the thoracic aorta with minimal calcified atherosclerotic plaque. Three-vessel coronary artery atherosclerosis. Mild cardiac enlargement. Trace pericardial effusion. Right axillary vascular stent. Mediastinum/Nodes: No enlarged axillary,  mediastinal, or hilar lymph nodes. Unremarkable thyroid and esophagus. Lungs/Pleura: Moderate-sized left pleural effusion with compressive atelectasis in the left lower lobe and lingula. The left lower lobe is nearly completely collapsed. No right pleural effusion. Subsegmental atelectasis in the right middle lobe and right lower lobe. Upper Abdomen: Calcified granulomas in the spleen. Small volume ascites in the visualized upper abdomen and small volume pneumoperitoneum consistent with history of peritoneal dialysis. Musculoskeletal: No acute osseous abnormality or suspicious osseous lesion. IMPRESSION: 1. Moderate left pleural effusion with compressive atelectasis. Near complete left lower lobe collapse. 2. Aortic Atherosclerosis (ICD10-I70.0) and three-vessel coronary artery atherosclerosis. Electronically Signed   By: Logan Bores M.D.   On: 04/22/2017 07:32    Left effusion moderate, note trapped lung   ASSESSMENT / PLAN: Discussion:  51 yo with complaints of left pleuritic chest pain , onset 10/12, chest pain worse with coughing, deep breathing and movement. He was admitted 10/16 for presumed pna and left pleural effusion. He denies fever chills sweats or purulent sputum. He was hypoxic with sat in 80's in ED. He has ESRD and is on home peritoneal dialysis, noted to CHF, HTN, anemia,Szs, DM and psoriasis. Note he was exposed to resp illness at home. Currently 10/19 he is on room air, ambulates in hallway, no further chest  pain or hypoxia. CT scan of chest 10/19  Reveals left moderate pleural effusion and PCCM asked to evaluate.  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 He does not want a thoracentesis at this time but will consider it and converse with his wife. Pleural effusion left Most likely is para pneumonic but cannot rule out CHF and or hemodialysis fluid from peritoneal HD.  He needs thoracentesis , if this is infection we need to drain before it converts to empyema  and requires VATS. If from dialysis then peritoneal then further input from renal.  Agree with abx Await culture data Culture pleural fluid if allowed to do thoracentesis.   ESRD with peritoneal dialysis   Per Renal HTN Per [facility]ad DM2 Per triad Psoriasis  Per triad Szs Per triad  Richardson Landry Minor ACNP Maryanna Shape PCCM Pager [phone] till 3 pm If no answer page [phone] 04/22/2017, 11:33 AM

## 2017-04-23 DIAGNOSIS — D509 Iron deficiency anemia, unspecified: Secondary | ICD-10-CM | POA: Diagnosis not present

## 2017-04-23 DIAGNOSIS — D631 Anemia in chronic kidney disease: Secondary | ICD-10-CM | POA: Diagnosis not present

## 2017-04-23 DIAGNOSIS — N2581 Secondary hyperparathyroidism of renal origin: Secondary | ICD-10-CM | POA: Diagnosis not present

## 2017-04-23 DIAGNOSIS — N2589 Other disorders resulting from impaired renal tubular function: Secondary | ICD-10-CM | POA: Diagnosis not present

## 2017-04-23 DIAGNOSIS — D630 Anemia in neoplastic disease: Secondary | ICD-10-CM | POA: Diagnosis not present

## 2017-04-23 DIAGNOSIS — N186 End stage renal disease: Secondary | ICD-10-CM | POA: Diagnosis not present

## 2017-04-23 LAB — HEPATITIS B SURFACE ANTIGEN: Hepatitis B Surface Ag: NEGATIVE

## 2017-04-24 DIAGNOSIS — N2589 Other disorders resulting from impaired renal tubular function: Secondary | ICD-10-CM | POA: Diagnosis not present

## 2017-04-24 DIAGNOSIS — D631 Anemia in chronic kidney disease: Secondary | ICD-10-CM | POA: Diagnosis not present

## 2017-04-24 DIAGNOSIS — D509 Iron deficiency anemia, unspecified: Secondary | ICD-10-CM | POA: Diagnosis not present

## 2017-04-24 DIAGNOSIS — D630 Anemia in neoplastic disease: Secondary | ICD-10-CM | POA: Diagnosis not present

## 2017-04-24 DIAGNOSIS — N2581 Secondary hyperparathyroidism of renal origin: Secondary | ICD-10-CM | POA: Diagnosis not present

## 2017-04-24 DIAGNOSIS — N186 End stage renal disease: Secondary | ICD-10-CM | POA: Diagnosis not present

## 2017-04-25 DIAGNOSIS — N2589 Other disorders resulting from impaired renal tubular function: Secondary | ICD-10-CM | POA: Diagnosis not present

## 2017-04-25 DIAGNOSIS — D509 Iron deficiency anemia, unspecified: Secondary | ICD-10-CM | POA: Diagnosis not present

## 2017-04-25 DIAGNOSIS — D631 Anemia in chronic kidney disease: Secondary | ICD-10-CM | POA: Diagnosis not present

## 2017-04-25 DIAGNOSIS — N186 End stage renal disease: Secondary | ICD-10-CM | POA: Diagnosis not present

## 2017-04-25 DIAGNOSIS — D630 Anemia in neoplastic disease: Secondary | ICD-10-CM | POA: Diagnosis not present

## 2017-04-25 DIAGNOSIS — N2581 Secondary hyperparathyroidism of renal origin: Secondary | ICD-10-CM | POA: Diagnosis not present

## 2017-04-25 LAB — CULTURE, BLOOD (ROUTINE X 2)
CULTURE: NO GROWTH
CULTURE: NO GROWTH
Special Requests: ADEQUATE
Special Requests: ADEQUATE

## 2017-04-26 DIAGNOSIS — D631 Anemia in chronic kidney disease: Secondary | ICD-10-CM | POA: Diagnosis not present

## 2017-04-26 DIAGNOSIS — N186 End stage renal disease: Secondary | ICD-10-CM | POA: Diagnosis not present

## 2017-04-26 DIAGNOSIS — N2589 Other disorders resulting from impaired renal tubular function: Secondary | ICD-10-CM | POA: Diagnosis not present

## 2017-04-26 DIAGNOSIS — D630 Anemia in neoplastic disease: Secondary | ICD-10-CM | POA: Diagnosis not present

## 2017-04-26 DIAGNOSIS — N2581 Secondary hyperparathyroidism of renal origin: Secondary | ICD-10-CM | POA: Diagnosis not present

## 2017-04-26 DIAGNOSIS — D509 Iron deficiency anemia, unspecified: Secondary | ICD-10-CM | POA: Diagnosis not present

## 2017-04-27 ENCOUNTER — Encounter: Payer: Self-pay | Admitting: Acute Care

## 2017-04-27 ENCOUNTER — Ambulatory Visit (INDEPENDENT_AMBULATORY_CARE_PROVIDER_SITE_OTHER): Payer: Medicare Other | Admitting: Acute Care

## 2017-04-27 ENCOUNTER — Inpatient Hospital Stay: Payer: Medicare Other | Admitting: Pulmonary Disease

## 2017-04-27 ENCOUNTER — Inpatient Hospital Stay: Payer: Medicare Other | Admitting: Acute Care

## 2017-04-27 ENCOUNTER — Ambulatory Visit (INDEPENDENT_AMBULATORY_CARE_PROVIDER_SITE_OTHER)
Admission: RE | Admit: 2017-04-27 | Discharge: 2017-04-27 | Disposition: A | Discharge status: Home / Self Care | Payer: Medicare Other | Source: Ambulatory Visit | Attending: Acute Care | Admitting: Acute Care

## 2017-04-27 VITALS — BP 158/80 | HR 97 | Ht 74.0 in | Wt 192.6 lb

## 2017-04-27 DIAGNOSIS — D631 Anemia in chronic kidney disease: Secondary | ICD-10-CM | POA: Diagnosis not present

## 2017-04-27 DIAGNOSIS — D630 Anemia in neoplastic disease: Secondary | ICD-10-CM | POA: Diagnosis not present

## 2017-04-27 DIAGNOSIS — R1084 Generalized abdominal pain: Secondary | ICD-10-CM

## 2017-04-27 DIAGNOSIS — J189 Pneumonia, unspecified organism: Secondary | ICD-10-CM

## 2017-04-27 DIAGNOSIS — J90 Pleural effusion, not elsewhere classified: Secondary | ICD-10-CM

## 2017-04-27 DIAGNOSIS — J181 Lobar pneumonia, unspecified organism: Secondary | ICD-10-CM

## 2017-04-27 DIAGNOSIS — N2581 Secondary hyperparathyroidism of renal origin: Secondary | ICD-10-CM | POA: Diagnosis not present

## 2017-04-27 DIAGNOSIS — N2589 Other disorders resulting from impaired renal tubular function: Secondary | ICD-10-CM | POA: Diagnosis not present

## 2017-04-27 DIAGNOSIS — N186 End stage renal disease: Secondary | ICD-10-CM | POA: Diagnosis not present

## 2017-04-27 DIAGNOSIS — D509 Iron deficiency anemia, unspecified: Secondary | ICD-10-CM | POA: Diagnosis not present

## 2017-04-27 DIAGNOSIS — K5900 Constipation, unspecified: Secondary | ICD-10-CM | POA: Diagnosis not present

## 2017-04-27 NOTE — Progress Notes (Signed)
History of Present Illness Bryan Wilkerson is a 51 y.o. male never smoker here for hospital follow up. He was seen as an inpatient by Dr. Ashok Cordia for CAP with left ? Loculated pleural effusion..     04/27/2017 Hospital Follow Up- Admission 04/19/2017-04/22/2017 for CAP pneumonia with left pleural effusion.He declined thoracentesis as an inpatient.: HPI at that admission was as follows: 51 y.o. male with known history of end-stage renal disease on home peritoneal dialysis. Patient also has a known history of diastolic congestive heart failure. He endorses a sick contact over the weekend and then on Sunday began to develop pleuritic type chest pain on his left. He also noticed increasing dyspnea. Denies any productive cough, fever, chills, or sweats. Denies any abdominal pain or nausea. Denies any other arthralgias or myalgias. Patient's left-sided pleuritic pain has since resolved after admission. Denies any new rashes or bruising. Patient was subsequently admitted to hospital and placed on broad-spectrum antibiotic coverage. Initially patient did have hypoxia but has since weaned to room air. Chest imaging identified a moderate size left pleural effusion that prompted pulmonary consultation. Patient reports he did have the influenza vaccine this Fall.  Pt. Presents today for follow up. He was treated with 10 days of antibiotics to finish  up as outpatient on Doxycycline, and prednisone. He declined a thoracentesis at the time of hospitalization.He states at the time he was too overwhelmed to have it done. He is compliant with his doxycycline since discharge.He states he has no fever or cough, but he has pain across the lower half of his back, primarily left sided, but it " Moves around" He states the pain makes him nauseated. He states he  is having regular bowel movements, but is complaining of lower abdominal pain.He states this pain just started when he stopped using his heating pad his this morning .  He states he has no shortness of breath, just pain.[name]  He states he is compliant with his peritoneal dialysis.He denies fever, orthopnea, or hemoptysis.   Test Results: 04/27/2017  CXR The lungs are well aerated bilaterally with the exception of a large somewhat loculated appearing left pleural effusion. This has enlarged somewhat in the interval from the prior exam. Underlying left lower lobe consolidation is again seen. No new focal abnormality is noted. Vascular stenting on right is noted. IMPRESSION: Slight increase in degree of left pleural effusion when compared with the prior study. Underlying infiltrate is present as well.  04/27/2017 Abdominal 1 view Scattered large and small bowel gas is noted. Fecal material is noted throughout the colon consistent with a mild degree of constipation. A a peritoneal dialysis catheter is noted in the midline of the pelvis. No free air is seen. No acute bony abnormality is noted. IMPRESSION: Changes of mild constipation. No other definitive abnormality is seen.  IMAGING/STUDIES: CT CHEST W/O 04/22/17:  Moderate-sized left pleural effusion with adjacent compressive atelectasis versus consolidation. Air bronchograms are present with a left lower lobe. Questionable loculation on my review of imaging. No pathologic mediastinal adenopathy. No pericardial effusion.  MICROBIOLOGY: Blood Cultures x2 10/17 >>> NoGrowth Influenza A/B PCR 10/16:  Negative   ANTIBIOTICS: Azithromycin 10/16 (x1 dose) Rocephin 10/16 (x1 dose) Vancomycin 10/17 (x1 dose) Aztreonam 10/17 >>>04/22/2017 Doxycycline 1019/2018>>  CBC Latest Ref Rng & Units 04/20/2017 04/19/2017 11/24/2016  WBC 4.0 - 10.5 K/uL 17.4(H) 13.8(H) 6.9  Hemoglobin 13.0 - 17.0 g/dL 11.5(L) 11.2(L) 11.2(L)  Hematocrit 39.0 - 52.0 % 35.1(L) 34.2(L) 33.8(L)  Platelets 150 - 400 K/uL  172 159 176    BMP Latest Ref Rng & Units 04/21/2017 04/20/2017 04/19/2017  Glucose 65 - 99 mg/dL 322(H)  149(H) 230(H)  BUN 6 - 20 mg/dL 55(H) 46(H) 46(H)  Creatinine 0.61 - 1.24 mg/dL 18.71(H) 17.97(H) 17.62(H)  Sodium 135 - 145 mmol/L 132(L) 134(L) 133(L)  Potassium 3.5 - 5.1 mmol/L 3.4(L) 2.9(L) 3.3(L)  Chloride 101 - 111 mmol/L 95(L) 97(L) 96(L)  CO2 22 - 32 mmol/L 22 21(L) 26  Calcium 8.9 - 10.3 mg/dL 6.7(L) 6.8(L) 7.0(L)    BNP    Component Value Date/Time   BNP 461.9 (H) 10/31/2013 1208    ProBNP    Component Value Date/Time   PROBNP 5,926.0 (H) 01/01/2013 0522    PFT No results found for: FEV1PRE, FEV1POST, FVCPRE, FVCPOST, TLC, DLCOUNC, PREFEV1FVCRT, PSTFEV1FVCRT  Dg Chest 2 View  Result Date: 04/27/2017 CLINICAL DATA:  Follow-up left pleural effusion. EXAM: CHEST  2 VIEW COMPARISON:  04/22/2017, 04/19/2017 FINDINGS: Cardiac shadow is stable. The lungs are well aerated bilaterally with the exception of a large somewhat loculated appearing left pleural effusion. This has enlarged somewhat in the interval from the prior exam. Underlying left lower lobe consolidation is again seen. No new focal abnormality is noted. Vascular stenting on right is noted. IMPRESSION: Slight increase in degree of left pleural effusion when compared with the prior study. Underlying infiltrate is present as well. Electronically Signed   By: Inez Catalina M.D.   On: 04/27/2017 17:07   Dg Chest 2 View  Result Date: 04/19/2017 CLINICAL DATA:  Worsening left chest pain over the past few days EXAM: CHEST  2 VIEW COMPARISON:  01/13/2017 FINDINGS: Cardiac shadow is mildly enlarged. Previously seen dialysis catheter has been removed. Upper extremity stenting on the left is noted. Mild right basilar atelectasis is noted. More marked left basilar infiltrate with associated effusion is seen. This would correspond with the patient's given clinical history. IMPRESSION: Bibasilar changes worse on the left than the right with associated effusion. Followup PA and lateral chest X-ray is recommended in 3-4 weeks following  trial of antibiotic therapy to ensure resolution and exclude underlying malignancy. Electronically Signed   By: Inez Catalina M.D.   On: 04/19/2017 16:48   Dg Abd 1 View  Result Date: 04/27/2017 CLINICAL DATA:  Left-sided flank pain EXAM: ABDOMEN - 1 VIEW COMPARISON:  None. FINDINGS: Scattered large and small bowel gas is noted. Fecal material is noted throughout the colon consistent with a mild degree of constipation. A a peritoneal dialysis catheter is noted in the midline of the pelvis. No free air is seen. No acute bony abnormality is noted. IMPRESSION: Changes of mild constipation. No other definitive abnormality is seen. Electronically Signed   By: Inez Catalina M.D.   On: 04/27/2017 17:08   Ct Chest Wo Contrast  Result Date: 04/22/2017 CLINICAL DATA:  Shortness of breath.  On dialysis. EXAM: CT CHEST WITHOUT CONTRAST TECHNIQUE: Multidetector CT imaging of the chest was performed following the standard protocol without IV contrast. COMPARISON:  Chest radiographs 04/19/2017 FINDINGS: Cardiovascular: Normal caliber of the thoracic aorta with minimal calcified atherosclerotic plaque. Three-vessel coronary artery atherosclerosis. Mild cardiac enlargement. Trace pericardial effusion. Right axillary vascular stent. Mediastinum/Nodes: No enlarged axillary, mediastinal, or hilar lymph nodes. Unremarkable thyroid and esophagus. Lungs/Pleura: Moderate-sized left pleural effusion with compressive atelectasis in the left lower lobe and lingula. The left lower lobe is nearly completely collapsed. No right pleural effusion. Subsegmental atelectasis in the right middle lobe and right lower lobe. Upper Abdomen:  Calcified granulomas in the spleen. Small volume ascites in the visualized upper abdomen and small volume pneumoperitoneum consistent with history of peritoneal dialysis. Musculoskeletal: No acute osseous abnormality or suspicious osseous lesion. IMPRESSION: 1. Moderate left pleural effusion with compressive  atelectasis. Near complete left lower lobe collapse. 2. Aortic Atherosclerosis (ICD10-I70.0) and three-vessel coronary artery atherosclerosis. Electronically Signed   By: Logan Bores M.D.   On: 04/22/2017 07:32     Past medical hx Past Medical History:  Diagnosis Date  . Anemia   . CHF (congestive heart failure) ([place])   . Diabetic retinopathy ([place])   . ESRD on peritoneal dialysis ([place])    "7 days/week" (04/20/2017)  . Hypertension   . Pneumonia 2016; 04/19/2017  . Psoriasis   . Type I diabetes mellitus ([place])      Social History  Substance Use Topics  . Smoking status: Never Smoker  . Smokeless tobacco: Never Used  . Alcohol use No    Mr.Koc reports that he has never smoked. He has never used smokeless tobacco. He reports that he does not drink alcohol or use drugs.  Tobacco Cessation: Never smoker  Past surgical hx, Family hx, Social hx all reviewed.  Current Outpatient Prescriptions on File Prior to Visit  Medication Sig  . amLODipine (NORVASC) 5 MG tablet Take 5 mg by mouth daily.  . B Complex-C-Folic Acid (NEPHRO-VITE PO) Take 1 tablet by mouth every morning.  . calcitRIOL (ROCALTROL) 0.25 MCG capsule Take 2 capsules (0.5 mcg total) by mouth daily.  . carvedilol (COREG) 6.25 MG tablet Take 1 tablet (6.25 mg total) by mouth 2 (two) times daily with a meal.  . doxycycline (VIBRA-TABS) 100 MG tablet Take 1 tablet (100 mg total) by mouth 2 (two) times daily.  . insulin NPH-regular Human (NOVOLIN 70/30) (70-30) 100 UNIT/ML injection Inject 4 Units into the skin daily.   . sucroferric oxyhydroxide (VELPHORO) 500 MG chewable tablet Chew 500 mg by mouth 3 (three) times daily with meals.  . triamcinolone ointment (KENALOG) 0.1 % Apply 1 application topically as needed (psorasis).    No current facility-administered medications on file prior to visit.      Allergies  Allergen Reactions  . Penicillins Other (See Comments)    UNSPECIFIED REACTION FROM CHILDHOOD Has  patient had a PCN reaction causing immediate rash, facial/tongue/throat swelling, SOB or lightheadedness with hypotension:Yes Has patient had a PCN reaction causing severe rash involving mucus membranes or skin necrosis:No Has patient had a PCN reaction that required hospitalization:Yes Has patient had a PCN reaction occurring within the last 10 years:No If all of the above answers are "NO", then may proceed with Cephalosporin use.      Review Of Systems:  Constitutional:   No  weight loss, night sweats,  Fevers, chills, fatigue, or  lassitude.  HEENT:   No headaches,  Difficulty swallowing,  Tooth/dental problems, or  Sore throat,                No sneezing, itching, ear ache, nasal congestion, post nasal drip,   CV:  No chest pain,  Orthopnea, PND, swelling in lower extremities, anasarca, dizziness, palpitations, syncope.   GI  No heartburn, indigestion, abdominal pain, nausea, vomiting, diarrhea, change in bowel habits, loss of appetite, bloody stools.   Resp: No shortness of breath with exertion or at rest.  No excess mucus, no productive cough,  No non-productive cough,  No coughing up of blood.  No change in color of mucus.  No wheezing.  No chest wall deformity  Skin: no rash or lesions.  GU: no dysuria, change in color of urine, no urgency or frequency.  No flank pain, no hematuria   MS:  No joint pain or swelling.  No decreased range of motion.  + Left sided back pain .  Psych:  No change in mood or affect. No depression or anxiety.  No memory loss.   Vital Signs BP (!) 158/80 (BP Location: Left Arm, Cuff Size: Normal)   Pulse 97   Ht 6\' 2"  (1.88 m)   Wt 192 lb 9.6 oz (87.4 kg)   SpO2 97%   BMI 24.73 kg/m    Physical Exam:  General- No distress,  A&Ox3, states he has new abdominal pain. ENT: No sinus tenderness, TM clear, pale nasal mucosa, no oral exudate,no post nasal drip, no LAN Cardiac: S1, S2, regular rate and rhythm, no murmur Chest: No wheeze/ rales/  dullness; no accessory muscle use, no nasal flaring, no sternal retractions, breath sounds diminished left base Abd.: Soft + tender, slightly distended, bowel sounds positive, stool noted upon palpation.  Peritoneal dialysis catheter clean dry and intact, no drainage, no purulence, no heat to touch around insertion site.  Per patient peritoneal dialysis fluid is clear Ext: No clubbing cyanosis, edema Neuro:  normal strength Skin: No rashes, warm and dry Psych: Anxious, complaining about abdominal pain that coughed after KUB completed.   Assessment/Plan  Pneumonia Patient is compliant with doxycycline Test x-ray indicates continued infiltrate Plan We will get a CXR today.(STAT)> Increased size L loculated pleural effusion  We will call you with results. If your CXR shows continued or worsening effusion, we will refer you for procedure to draw the fluid off  Your lung 04/28/2017. Consider Thoracentesis .>> Will consult with Dr. Ashok Cordia Fluid for pH, Gram Stain and Culture, Blood Cell count with differential, Glucose level, protein levels, and LDH, cytology, creatinine level,amylase, and triglyceride Serum LDH,albumin,  glucose, CBC with diff, PT/PTT, CMET If you get worse through the night please seek emergency care. Follow up after procedure within 1 week for follow-up chest x-ray Please contact office for sooner follow up if symptoms do not improve or worsen or seek emergency care  Continue Doxycycline until gone. Will need follow-up imaging to assure resolution of infiltrate Consider extension of length  of anti-microbial therapy treatment    Generalized abdominal pain Patient presented for hospital follow-up with lower abdominal pain, new onset. Physical exam was unrevealing Peritoneal dialysis catheter did not appear infected Patient dialysate has been clear Patient has been afebrile Abdominal x-ray indicates constipation and gas Plan KUB today ( STAT) We will call you with  results. Please follow-up with PCP regarding any further abdominal pain Consider Colace or stool softener Please follow up with PCP if symptoms do not improve or worsen or seek emergency care    Loculated pleural effusion Increase in size of left loculated pleural effusion Patient had refused thoracentesis as inpatient 10/16 through 10/19 Plan Consider Thoracentesis .>> Will consult with Dr. Ashok Cordia Fluid for pH, Gram Stain and Culture, Blood Cell count with differential, Glucose level, protein levels, and LDH, cytology, creatinine level,amylase, and triglyceride Serum LDH,albumin,  glucose, CBC with diff, PT/PTT, CMET If you get worse through the night please seek emergency care. Follow up after procedure within 1 week for follow-up chest x-ray Please contact office for sooner follow up if symptoms do not improve or worsen or seek emergency care  Continue Doxycycline until gone.  Magdalen Spatz, NP 04/27/2017  9:01 PM

## 2017-04-27 NOTE — Assessment & Plan Note (Addendum)
Patient presented for hospital follow-up with lower abdominal pain, new onset. Physical exam was unrevealing Peritoneal dialysis catheter did not appear infected Patient dialysate has been clear Patient has been afebrile Abdominal x-ray indicates constipation and gas Plan KUB today ( STAT) We will call you with results. Please follow-up with PCP regarding any further abdominal pain Consider Colace or stool softener Please follow up with PCP if symptoms do not improve or worsen or seek emergency care

## 2017-04-27 NOTE — Assessment & Plan Note (Signed)
Increase in size of left loculated pleural effusion Patient had refused thoracentesis as inpatient 10/16 through 10/19 Plan Consider Thoracentesis .>> Will consult with Dr. Ashok Cordia Fluid for pH, Gram Stain and Culture, Blood Cell count with differential, Glucose level, protein levels, and LDH, cytology, creatinine level,amylase, and triglyceride Serum LDH,albumin,  glucose, CBC with diff, PT/PTT, CMET If you get worse through the night please seek emergency care. Follow up after procedure within 1 week for follow-up chest x-ray Please contact office for sooner follow up if symptoms do not improve or worsen or seek emergency care  Continue Doxycycline until gone.

## 2017-04-27 NOTE — Patient Instructions (Addendum)
It is good to see you today. We will get a CXR today.(STAT) KUB today ( STAT) We will call you with results. If your CXR shows continued or worsening effusion, we will refer you for procedure to draw the fluid off. Continue Doxycycline until gone. Follow up with Dr. Edilia Bo for continued abdominal pain, and for labs as recommended in your discharge paperwork..  Follow up with Dr. Ashok Cordia in 1 month to ensure continued improvement. Follow up within 1 week of thoracentesis for CXR Please contact office for sooner follow up if symptoms do not improve or worsen or seek emergency care

## 2017-04-27 NOTE — Assessment & Plan Note (Addendum)
Patient is compliant with doxycycline Test x-ray indicates continued infiltrate Plan We will get a CXR today.(STAT)> Increased size L loculated pleural effusion  We will call you with results. If your CXR shows continued or worsening effusion, we will refer you for procedure to draw the fluid off  Your lung 04/28/2017. Consider Thoracentesis .>> Will consult with Dr. Ashok Cordia Fluid for pH, Gram Stain and Culture, Blood Cell count with differential, Glucose level, protein levels, and LDH, cytology, creatinine level,amylase, and triglyceride Serum LDH,albumin,  glucose, CBC with diff, PT/PTT, CMET If you get worse through the night please seek emergency care. Follow up after procedure within 1 week for follow-up chest x-ray Please contact office for sooner follow up if symptoms do not improve or worsen or seek emergency care  Continue Doxycycline until gone. Will need follow-up imaging to assure resolution of infiltrate Consider extension of length  of anti-microbial therapy treatment

## 2017-04-28 DIAGNOSIS — N2581 Secondary hyperparathyroidism of renal origin: Secondary | ICD-10-CM | POA: Diagnosis not present

## 2017-04-28 DIAGNOSIS — D631 Anemia in chronic kidney disease: Secondary | ICD-10-CM | POA: Diagnosis not present

## 2017-04-28 DIAGNOSIS — N2589 Other disorders resulting from impaired renal tubular function: Secondary | ICD-10-CM | POA: Diagnosis not present

## 2017-04-28 DIAGNOSIS — N186 End stage renal disease: Secondary | ICD-10-CM | POA: Diagnosis not present

## 2017-04-28 DIAGNOSIS — D509 Iron deficiency anemia, unspecified: Secondary | ICD-10-CM | POA: Diagnosis not present

## 2017-04-28 DIAGNOSIS — D630 Anemia in neoplastic disease: Secondary | ICD-10-CM | POA: Diagnosis not present

## 2017-04-28 NOTE — Progress Notes (Signed)
Note reviewed.  Sonia Baller Ashok Cordia, M.D. [facility] Pager:  [phone] After 7pm or if no response, call [phone] 7:29 AM 04/28/17

## 2017-04-29 ENCOUNTER — Telehealth: Payer: Self-pay | Admitting: Pulmonary Disease

## 2017-04-29 DIAGNOSIS — D631 Anemia in chronic kidney disease: Secondary | ICD-10-CM | POA: Diagnosis not present

## 2017-04-29 DIAGNOSIS — D630 Anemia in neoplastic disease: Secondary | ICD-10-CM | POA: Diagnosis not present

## 2017-04-29 DIAGNOSIS — J90 Pleural effusion, not elsewhere classified: Secondary | ICD-10-CM

## 2017-04-29 DIAGNOSIS — N186 End stage renal disease: Secondary | ICD-10-CM | POA: Diagnosis not present

## 2017-04-29 DIAGNOSIS — N2589 Other disorders resulting from impaired renal tubular function: Secondary | ICD-10-CM | POA: Diagnosis not present

## 2017-04-29 DIAGNOSIS — D509 Iron deficiency anemia, unspecified: Secondary | ICD-10-CM | POA: Diagnosis not present

## 2017-04-29 DIAGNOSIS — N2581 Secondary hyperparathyroidism of renal origin: Secondary | ICD-10-CM | POA: Diagnosis not present

## 2017-04-29 NOTE — Addendum Note (Signed)
Addended by: Della Goo C on: 04/29/2017 03:40 PM   Modules accepted: Orders

## 2017-04-29 NOTE — Telephone Encounter (Signed)
Javier Glazier, MD  Emersen Mascari, Berline Lopes, CMA        This patient needs a left U/S thoracentesis ASAP by Radiology. Please send pleural fluid for the following:  LDH  Total Protein  Albumin  Glucose  Body fluid cell count & differential  Cytology  Body fluid culture    Order placed. Called patient unable to reach. Patient is aware that this is going to be ordered.

## 2017-04-29 NOTE — Telephone Encounter (Signed)
Bryan Glazier, MD  Cox, Berline Lopes, CMA        This patient needs a left U/S thoracentesis ASAP by Radiology. Please send pleural fluid for the following:  LDH  Total Protein  Albumin  Glucose  Body fluid cell count & differential  Cytology  Body fluid culture    Order placed. Called patient, unable to reach. Left message to give [place] a call back regarding this. Spoke with Tim Lair, NP she said that patient is aware that he will be having this done, we just need to call patient to let him know that we have ordered it.

## 2017-04-30 DIAGNOSIS — N2581 Secondary hyperparathyroidism of renal origin: Secondary | ICD-10-CM | POA: Diagnosis not present

## 2017-04-30 DIAGNOSIS — N2589 Other disorders resulting from impaired renal tubular function: Secondary | ICD-10-CM | POA: Diagnosis not present

## 2017-04-30 DIAGNOSIS — D631 Anemia in chronic kidney disease: Secondary | ICD-10-CM | POA: Diagnosis not present

## 2017-04-30 DIAGNOSIS — D509 Iron deficiency anemia, unspecified: Secondary | ICD-10-CM | POA: Diagnosis not present

## 2017-04-30 DIAGNOSIS — N186 End stage renal disease: Secondary | ICD-10-CM | POA: Diagnosis not present

## 2017-04-30 DIAGNOSIS — D630 Anemia in neoplastic disease: Secondary | ICD-10-CM | POA: Diagnosis not present

## 2017-05-01 DIAGNOSIS — N2589 Other disorders resulting from impaired renal tubular function: Secondary | ICD-10-CM | POA: Diagnosis not present

## 2017-05-01 DIAGNOSIS — N186 End stage renal disease: Secondary | ICD-10-CM | POA: Diagnosis not present

## 2017-05-01 DIAGNOSIS — D630 Anemia in neoplastic disease: Secondary | ICD-10-CM | POA: Diagnosis not present

## 2017-05-01 DIAGNOSIS — D631 Anemia in chronic kidney disease: Secondary | ICD-10-CM | POA: Diagnosis not present

## 2017-05-01 DIAGNOSIS — D509 Iron deficiency anemia, unspecified: Secondary | ICD-10-CM | POA: Diagnosis not present

## 2017-05-01 DIAGNOSIS — N2581 Secondary hyperparathyroidism of renal origin: Secondary | ICD-10-CM | POA: Diagnosis not present

## 2017-05-02 ENCOUNTER — Encounter (HOSPITAL_COMMUNITY): Payer: Self-pay | Admitting: General Surgery

## 2017-05-02 ENCOUNTER — Ambulatory Visit (HOSPITAL_COMMUNITY)
Admission: RE | Admit: 2017-05-02 | Discharge: 2017-05-02 | Disposition: A | Discharge status: Home / Self Care | Payer: Medicare Other | Source: Ambulatory Visit | Attending: Pulmonary Disease | Admitting: Pulmonary Disease

## 2017-05-02 ENCOUNTER — Other Ambulatory Visit (HOSPITAL_COMMUNITY): Payer: Self-pay | Admitting: General Surgery

## 2017-05-02 ENCOUNTER — Ambulatory Visit (HOSPITAL_COMMUNITY)
Admission: RE | Admit: 2017-05-02 | Discharge: 2017-05-02 | Disposition: A | Discharge status: Home / Self Care | Payer: Medicare Other | Source: Ambulatory Visit | Attending: General Surgery | Admitting: General Surgery

## 2017-05-02 DIAGNOSIS — N2581 Secondary hyperparathyroidism of renal origin: Secondary | ICD-10-CM | POA: Diagnosis not present

## 2017-05-02 DIAGNOSIS — D630 Anemia in neoplastic disease: Secondary | ICD-10-CM | POA: Diagnosis not present

## 2017-05-02 DIAGNOSIS — D631 Anemia in chronic kidney disease: Secondary | ICD-10-CM | POA: Diagnosis not present

## 2017-05-02 DIAGNOSIS — R091 Pleurisy: Secondary | ICD-10-CM | POA: Diagnosis not present

## 2017-05-02 DIAGNOSIS — J90 Pleural effusion, not elsewhere classified: Secondary | ICD-10-CM | POA: Diagnosis not present

## 2017-05-02 DIAGNOSIS — N186 End stage renal disease: Secondary | ICD-10-CM | POA: Diagnosis not present

## 2017-05-02 DIAGNOSIS — N2589 Other disorders resulting from impaired renal tubular function: Secondary | ICD-10-CM | POA: Diagnosis not present

## 2017-05-02 DIAGNOSIS — Z9889 Other specified postprocedural states: Secondary | ICD-10-CM

## 2017-05-02 DIAGNOSIS — D509 Iron deficiency anemia, unspecified: Secondary | ICD-10-CM | POA: Diagnosis not present

## 2017-05-02 DIAGNOSIS — Z992 Dependence on renal dialysis: Secondary | ICD-10-CM | POA: Insufficient documentation

## 2017-05-02 HISTORY — PX: IR THORACENTESIS ASP PLEURAL SPACE W/IMG GUIDE: IMG5380

## 2017-05-02 LAB — GRAM STAIN

## 2017-05-02 LAB — GLUCOSE, PLEURAL OR PERITONEAL FLUID: Glucose, Fluid: 414 mg/dL

## 2017-05-02 LAB — PROTEIN, PLEURAL OR PERITONEAL FLUID: Total protein, fluid: 4.3 g/dL

## 2017-05-02 LAB — BODY FLUID CELL COUNT WITH DIFFERENTIAL
EOS FL: 0 %
LYMPHS FL: 2 %
MONOCYTE-MACROPHAGE-SEROUS FLUID: 35 % — AB (ref 50–90)
Neutrophil Count, Fluid: 63 % — ABNORMAL HIGH (ref 0–25)
Total Nucleated Cell Count, Fluid: 605 cu mm (ref 0–1000)

## 2017-05-02 LAB — LACTATE DEHYDROGENASE, PLEURAL OR PERITONEAL FLUID: LD FL: 619 U/L — AB (ref 3–23)

## 2017-05-02 LAB — ALBUMIN, PLEURAL OR PERITONEAL FLUID: ALBUMIN FL: 1.2 g/dL

## 2017-05-02 MED ORDER — LIDOCAINE HCL 1 % IJ SOLN
INTRAMUSCULAR | Status: AC
  Filled 2017-05-02: qty 20

## 2017-05-02 MED ORDER — LIDOCAINE HCL (PF) 1 % IJ SOLN
INTRAMUSCULAR | Status: DC | PRN
  Administered 2017-05-02: 10 mL

## 2017-05-02 NOTE — Procedures (Signed)
Ultrasound-guided diagnostic and therapeutic left thoracentesis performed yielding 120cc of slightly cloudy serous colored fluid.  There were multiple small loculations noted within the fluid. No immediate complications. Follow-up chest x-ray pending.      Ladonna Vanorder E 4:01 PM 05/02/2017

## 2017-05-03 DIAGNOSIS — N2589 Other disorders resulting from impaired renal tubular function: Secondary | ICD-10-CM | POA: Diagnosis not present

## 2017-05-03 DIAGNOSIS — D631 Anemia in chronic kidney disease: Secondary | ICD-10-CM | POA: Diagnosis not present

## 2017-05-03 DIAGNOSIS — D509 Iron deficiency anemia, unspecified: Secondary | ICD-10-CM | POA: Diagnosis not present

## 2017-05-03 DIAGNOSIS — D630 Anemia in neoplastic disease: Secondary | ICD-10-CM | POA: Diagnosis not present

## 2017-05-03 DIAGNOSIS — N2581 Secondary hyperparathyroidism of renal origin: Secondary | ICD-10-CM | POA: Diagnosis not present

## 2017-05-03 DIAGNOSIS — N186 End stage renal disease: Secondary | ICD-10-CM | POA: Diagnosis not present

## 2017-05-04 ENCOUNTER — Telehealth: Payer: Self-pay | Admitting: Family Medicine

## 2017-05-04 ENCOUNTER — Other Ambulatory Visit: Payer: Self-pay | Admitting: Pulmonary Disease

## 2017-05-04 DIAGNOSIS — D630 Anemia in neoplastic disease: Secondary | ICD-10-CM | POA: Diagnosis not present

## 2017-05-04 DIAGNOSIS — N186 End stage renal disease: Secondary | ICD-10-CM | POA: Diagnosis not present

## 2017-05-04 DIAGNOSIS — D631 Anemia in chronic kidney disease: Secondary | ICD-10-CM | POA: Diagnosis not present

## 2017-05-04 DIAGNOSIS — J90 Pleural effusion, not elsewhere classified: Secondary | ICD-10-CM

## 2017-05-04 DIAGNOSIS — N2589 Other disorders resulting from impaired renal tubular function: Secondary | ICD-10-CM | POA: Diagnosis not present

## 2017-05-04 DIAGNOSIS — N2581 Secondary hyperparathyroidism of renal origin: Secondary | ICD-10-CM | POA: Diagnosis not present

## 2017-05-04 DIAGNOSIS — Z992 Dependence on renal dialysis: Secondary | ICD-10-CM | POA: Diagnosis not present

## 2017-05-04 DIAGNOSIS — E1129 Type 2 diabetes mellitus with other diabetic kidney complication: Secondary | ICD-10-CM | POA: Diagnosis not present

## 2017-05-04 DIAGNOSIS — D509 Iron deficiency anemia, unspecified: Secondary | ICD-10-CM | POA: Diagnosis not present

## 2017-05-04 NOTE — Telephone Encounter (Signed)
Patient is coming in tomorrow for [place] pt is requesting a TB test.

## 2017-05-05 ENCOUNTER — Telehealth: Payer: Self-pay | Admitting: *Deleted

## 2017-05-05 ENCOUNTER — Ambulatory Visit (INDEPENDENT_AMBULATORY_CARE_PROVIDER_SITE_OTHER): Payer: Medicare Other | Admitting: Family Medicine

## 2017-05-05 ENCOUNTER — Encounter: Payer: Self-pay | Admitting: Family Medicine

## 2017-05-05 ENCOUNTER — Inpatient Hospital Stay: Payer: Medicare Other | Admitting: Acute Care

## 2017-05-05 VITALS — BP 136/82 | HR 72 | Temp 98.3°F | Ht 74.0 in | Wt 196.0 lb

## 2017-05-05 DIAGNOSIS — N184 Chronic kidney disease, stage 4 (severe): Secondary | ICD-10-CM | POA: Diagnosis not present

## 2017-05-05 DIAGNOSIS — Z79899 Other long term (current) drug therapy: Secondary | ICD-10-CM | POA: Diagnosis not present

## 2017-05-05 DIAGNOSIS — Z23 Encounter for immunization: Secondary | ICD-10-CM | POA: Diagnosis not present

## 2017-05-05 DIAGNOSIS — Z09 Encounter for follow-up examination after completed treatment for conditions other than malignant neoplasm: Secondary | ICD-10-CM

## 2017-05-05 DIAGNOSIS — E0822 Diabetes mellitus due to underlying condition with diabetic chronic kidney disease: Secondary | ICD-10-CM | POA: Diagnosis not present

## 2017-05-05 DIAGNOSIS — N2581 Secondary hyperparathyroidism of renal origin: Secondary | ICD-10-CM | POA: Diagnosis not present

## 2017-05-05 DIAGNOSIS — J189 Pneumonia, unspecified organism: Secondary | ICD-10-CM | POA: Diagnosis not present

## 2017-05-05 DIAGNOSIS — Z992 Dependence on renal dialysis: Secondary | ICD-10-CM | POA: Diagnosis not present

## 2017-05-05 DIAGNOSIS — E0865 Diabetes mellitus due to underlying condition with hyperglycemia: Secondary | ICD-10-CM

## 2017-05-05 DIAGNOSIS — T82858A Stenosis of vascular prosthetic devices, implants and grafts, initial encounter: Secondary | ICD-10-CM | POA: Diagnosis not present

## 2017-05-05 DIAGNOSIS — N186 End stage renal disease: Secondary | ICD-10-CM

## 2017-05-05 DIAGNOSIS — I871 Compression of vein: Secondary | ICD-10-CM | POA: Diagnosis not present

## 2017-05-05 DIAGNOSIS — Z794 Long term (current) use of insulin: Secondary | ICD-10-CM | POA: Diagnosis not present

## 2017-05-05 DIAGNOSIS — IMO0002 Reserved for concepts with insufficient information to code with codable children: Secondary | ICD-10-CM

## 2017-05-05 LAB — BASIC METABOLIC PANEL
BUN: 50 mg/dL — AB (ref 6–23)
CALCIUM: 6.9 mg/dL — AB (ref 8.4–10.5)
CO2: 28 mEq/L (ref 19–32)
CREATININE: 16.33 mg/dL — AB (ref 0.40–1.50)
Chloride: 93 mEq/L — ABNORMAL LOW (ref 96–112)
GFR: 4.04 mL/min — CL (ref >60.00)
Glucose, Bld: 425 mg/dL — ABNORMAL HIGH (ref 70–99)
Potassium: 3.8 mEq/L (ref 3.5–5.1)
Sodium: 132 mEq/L — ABNORMAL LOW (ref 135–145)

## 2017-05-05 NOTE — Patient Instructions (Signed)
It was nice to see you again today- I am so glad you are feeling better!  I will be in touch with your labs from today Please come in for a chest x-ray only in about 2 weeks

## 2017-05-05 NOTE — Progress Notes (Addendum)
[facility] [address] [place], [place] [address] [phone]  Date:  05/05/2017   Name:  Bryan Wilkerson   DOB:  08/11/1965   MRN:  [id number]  PCP:  [name], MD    Chief Complaint: Hospitalization Follow-up (Pt here for hosp f/u. Pt states that he will need tetanus vaccine today. )   History of Present Illness:  Bryan Wilkerson is a 51 y.o. very pleasant male patient who presents with the following:  Here today for a follow-up visit from the hospital Admit date: 04/19/2017 Discharge date: 04/22/2017  Recommendations for Outpatient Follow-up:  1. Dr. Lamar Blinks, PCP in one week with repeat labs (CBC & BMP). Please follow final blood culture results that were sent from the hospital. 2. Dr. Tera Partridge, Pulmonology on 04/27/17 at 10 AM. 3. Dr. Edrick Oh, Nephrology  Discharge Diagnoses:  Principal Problem:   Pneumonia Active Problems:   CHF (congestive heart failure), NYHA class II ([place])   Essential hypertension, benign   Anemia   ESRD on dialysis ([place])   Hypocalcemia   Diabetes mellitus due to underlying condition, uncontrolled, with stage 4 chronic kidney disease, with long-term current use of insulin ([place])  Brief Summary: 51 year old male with a PMH of type I DM, HTN, ESRD due to DM on CCPD, anemia, chronic diastolic CHF, presented to ED with left-sided chest/back pain, worse with deep inspiration and chest wall movements, dyspnea but no significant cough or fevers. Exposed to family member with respiratory illness. In ED, hypoxic to 82% on room air, chest x-ray showed findings suggestive of left-sided pneumonia with associated pleural effusion. Admitted for suspected HCAP. Nephrology consulted for dialysis needs.  Assessment & Plan:  Suspected left lobar (LLL) pneumonia/HCAP with pleural effusion/pleuritic chest pain - HIV screen nonreactive. Pro-calcitonin 1.35. Influenza panel PCR  negative. Blood cultures 2: Negative to date. - Treated empirically with aztreonam, vancomycin (penicillin allergy) and prednisone. - Recommend repeating chest x-ray in 4 weeks to ensure resolution of pneumonia findings. - Clinically improved. - Due to patient's atypical presentation (only had chest pain and dyspnea but no cough or fevers), obtained CT chest without contrast to further evaluate.  - CT chest without contrast showed moderate left pleural effusion with compressive atelectasis and near complete left lower lobe collapse. Pulmonology was consulted. Diagnostic and therapeutic thoracentesis was offered but patient declines at this time. The pleural effusion is felt to be parapneumonic but cannot rule out CHF and/or hemodialysis fluid from peritoneal HD. I discussed with Dr. Creig Hines, Pulmonology who recommended discharging patient on oral doxycycline to complete total 10 days course of antibiotics and has arranged for close outpatient follow-up.  Acute respiratory failure with hypoxia - Secondary to pneumonia, pleural effusion and pleuritic chest pain. Resolved.  Hypokalemia - Improved after replacement. Periodically follow BMP as outpatient.  ESRD on CCPD - Nephrology consulted for dialysis needs. I discussed with Nephrology regarding discharge plans and they are agreeable.  Essential hypertension - Mildly uncontrolled and fluctuating at times. Continue carvedilol.Resume amlodipine at discharge.  Anemia - Secondary to chronic kidney disease. Stable  Type I DM - Home insulin regimen changed to Lantus and SSI while hospitalized. Uncontrolled and fluctuating. Precipitated by prednisone. Prednisone was discontinued after 2 doses. Patient states that he monitors his CBGs at home with every meal and it usually fluctuates between 110-150 mg/dL. He seems to be well educated regarding management of home insulin (70/30) based upon sliding scale and  follows Dr. Chalmers Cater, Endocrinology. CBG  had increased to 456 this morning but is back down to 180. Expect continued improvement now that steroids have been discontinued. Patient's leukocytosis is also likely related to steroids.  Secondary hyperparathyroidism  - management per nephrology.  Elevated troponin - Minimal with flat trend. Likely related to ESRD rather than ACS.  Chronic diastolic CHF  - Does not appear volume overloaded.   Psoriasis  - Patient states that Delsa Grana has been discontinued and his physicians are checking with insurance to transitioning him to some other medication.  He was admitted with pneumonia. After discharge he also had thoracentesis, and he is feeling much better.  He feels like his breathing is better, about back to baseline He is done with his doxycycline This am his glucose was 115 He has no fever His energy level is "up and down up and down."  No vomiting  He is somewhat anemic due to his renal disease but not severly  He was a bigger guy in years past when he was playing football- about 285 lbs.  He feels like he wants to get back up to this weight but knows that this may not be realistic   He has seen Dr. Justin Mend for follow-up already, a couple of days ago He is doing regular dialysis for 4 weeks and hold the PD to make sure all is well Her derm wants to put him on a biologic for his psoriasis so he needs a TB screening test today He has been on dialysis for about 3 years now, hopes to receive a transplant kidney some day  Patient Active Problem List   Diagnosis Date Noted  . Generalized abdominal pain 04/27/2017  . Loculated pleural effusion 04/27/2017  . Pneumonia 04/19/2017  . Diabetes mellitus due to underlying condition, uncontrolled, with stage 4 chronic kidney disease, with long-term current use of insulin ([place]) 01/13/2017  . Seizure ([place]) 11/23/2016  . Hypocalcemia 11/23/2016  . ESRD on dialysis ([place]) 11/16/2013  . Pre-operative cardiovascular examination 11/12/2013  .  Dyspnea 10/31/2013  . CHF (congestive heart failure), NYHA class II ([place]) 12/29/2012  . Essential hypertension, benign 12/29/2012  . Hyperkalemia 12/29/2012  . Anemia 12/29/2012    Past Medical History:  Diagnosis Date  . Anemia   . CHF (congestive heart failure) ([place])   . Diabetic retinopathy ([place])   . ESRD on peritoneal dialysis ([place])    "7 days/week" (04/20/2017)  . Hypertension   . Pneumonia 2016; 04/19/2017  . Psoriasis   . Type I diabetes mellitus ([place])     Past Surgical History:  Procedure Laterality Date  . AV FISTULA PLACEMENT Right 12/05/2013   Procedure: RADIOCEPHALIC VS. BRACHIOCEPHALIC ARTERIOVENOUS (AV) FISTULA CREATION;  Surgeon: Conrad [place], MD;  Location: [facility];  Service: Vascular;  Laterality: Right;  . AV FISTULA PLACEMENT Left 07/20/2016   Procedure: LEFT ARM RADIOCEPHALIC ARTERIOVENOUS (AV) FISTULA CREATION;  Surgeon: Waynetta Sandy, MD;  Location: [facility];  Service: Vascular;  Laterality: Left;  . CATARACT EXTRACTION W/ INTRAOCULAR LENS  IMPLANT, BILATERAL Bilateral   . EYE SURGERY    . FISTULOGRAM Right 07/16/2014   Procedure: FISTULOGRAM;  Surgeon: Conrad [place], MD;  Location: [facility];  Service: Vascular;  Laterality: Right;  . IR THORACENTESIS ASP PLEURAL SPACE W/IMG GUIDE  05/02/2017  . LIGATION OF COMPETING BRANCHES OF ARTERIOVENOUS FISTULA Right 07/16/2014   Procedure: LIGATION OF COMPETING BRANCHES OF ARTERIOVENOUS FISTULA;  Surgeon: Conrad [place], MD;  Location: [facility];  Service: Vascular;  Laterality: Right;  . PERITONEAL CATHETER INSERTION Left ~ 08/2016  . PORT-A-CATH REMOVAL  2017  . PORTA CATH INSERTION Right    "for hemodialysis"  . RETINAL DETACHMENT SURGERY Right     Social History  Substance Use Topics  . Smoking status: Never Smoker  . Smokeless tobacco: Never Used  . Alcohol use No    Family History  Problem Relation Age of Onset  . Hypertension Mother   . Heart attack Mother   . Chronic Renal Failure Neg Hx   . Diabetes Neg  Hx   . Stroke Neg Hx   . Cancer Neg Hx     Allergies  Allergen Reactions  . Penicillins Other (See Comments)    UNSPECIFIED REACTION FROM CHILDHOOD Has patient had a PCN reaction causing immediate rash, facial/tongue/throat swelling, SOB or lightheadedness with hypotension:Yes Has patient had a PCN reaction causing severe rash involving mucus membranes or skin necrosis:No Has patient had a PCN reaction that required hospitalization:Yes Has patient had a PCN reaction occurring within the last 10 years:No If all of the above answers are "NO", then may proceed with Cephalosporin use.      Medication list has been reviewed and updated.  Current Outpatient Prescriptions on File Prior to Visit  Medication Sig Dispense Refill  . amLODipine (NORVASC) 5 MG tablet Take 5 mg by mouth daily.    . B Complex-C-Folic Acid (NEPHRO-VITE PO) Take 1 tablet by mouth every morning.    . calcitRIOL (ROCALTROL) 0.25 MCG capsule Take 2 capsules (0.5 mcg total) by mouth daily. 30 capsule 0  . carvedilol (COREG) 6.25 MG tablet Take 1 tablet (6.25 mg total) by mouth 2 (two) times daily with a meal. 60 tablet 0  . insulin NPH-regular Human (NOVOLIN 70/30) (70-30) 100 UNIT/ML injection Inject 4 Units into the skin daily.     . sucroferric oxyhydroxide (VELPHORO) 500 MG chewable tablet Chew 500 mg by mouth 3 (three) times daily with meals.    . triamcinolone ointment (KENALOG) 0.1 % Apply 1 application topically as needed (psorasis).      No current facility-administered medications on file prior to visit.     Review of Systems:  As per HPI- otherwise negative. No fever or chills No CP or SOB He does have widespread psoriasis   Physical Examination: Vitals:   05/05/17 1427  BP: 136/82  Pulse: 72  Temp: 98.3 F (36.8 C)  SpO2: 92%   Vitals:   05/05/17 1427  Weight: 196 lb (88.9 kg)  Height: 6\' 2"  (1.88 m)   Body mass index is 25.16 kg/m. Ideal Body Weight: Weight in (lb) to have BMI = 25:  194.3  GEN: WDWN, NAD, Non-toxic, A & O x 3, normal weight, looks well HEENT: Atraumatic, Normocephalic. Neck supple. No masses, No LAD.  Bilateral TM wnl, oropharynx normal.  PEERL,EOMI.   Ears and Nose: No external deformity. CV: RRR, No M/G/R. No JVD. No thrill. No extra heart sounds. PULM: CTA B, no wheezes, crackles, rhonchi. No retractions. No resp. distress. No accessory muscle use. ABD: S, NT, ND, +BS. No rebound. No HSM.  Benign belly EXTR: No c/c/e NEURO Normal gait.  PSYCH: Normally interactive. Conversant. Not depressed or anxious appearing.  Calm demeanor.    Assessment and Plan: Hospital discharge follow-up  Diabetes mellitus due to underlying condition, uncontrolled, with stage 4 chronic kidney disease, with long-term current use of insulin ([place]) - Plan: Basic metabolic panel  ESRD on dialysis [facility])  Community  acquired pneumonia, unspecified laterality - Plan: DG Chest 2 View  High risk medication use - Plan: CANCELED: Quantiferon tb gold assay  Immunization due  Here today for a follow-up visit from recent hospital stay with CAP He is feeling much better His dermatologist wants to start him on a new biologic for his psoriasis.  Had ordered quantiferon gold.  However then got fax from derm- he needs a PPD due to inconclusive quantiferon gold.  Canceled the QG, will have him come back in for PPD He is continuing to follow-up with Dr. Chalmers Cater for his DM Will monitor his lytes today with BMP Continue dialysis per Dr. Justin Mend Signed Lamar Blinks, MD  Will need PPD results when done [facility] to [facility] .  Attn Dr. Tamala Julian, fax to [phone] Received his labs approx 8pm- glucose is quite high.  Called and LMOM with pt and with his wife- please monitor glucose and make sure this comes down. Will have to try him again tomorrow and hopefully will reach Results for orders placed or performed in visit on 39/53/20  Basic metabolic panel  Result Value Ref Range   Sodium  132 (L) 135 - 145 mEq/L   Potassium 3.8 3.5 - 5.1 mEq/L   Chloride 93 (L) 96 - 112 mEq/L   CO2 28 19 - 32 mEq/L   Glucose, Bld 425 (H) 70 - 99 mg/dL   BUN 50 (H) 6 - 23 mg/dL   Creatinine, Ser 16.33 (HH) 0.40 - 1.50 mg/dL   Calcium 6.9 (L) 8.4 - 10.5 mg/dL   GFR 4.04 (LL) >60.00 mL/min

## 2017-05-05 NOTE — Telephone Encounter (Signed)
Received request for Physician Orders from [facility] for PPD placement; forwarded to provider/SLS 11/01

## 2017-05-07 LAB — CULTURE, BODY FLUID W GRAM STAIN -BOTTLE: Culture: NO GROWTH

## 2017-05-07 LAB — CULTURE, BODY FLUID-BOTTLE

## 2017-05-09 ENCOUNTER — Encounter: Payer: Medicare Other | Admitting: Thoracic Surgery (Cardiothoracic Vascular Surgery)

## 2017-05-10 ENCOUNTER — Telehealth: Payer: Self-pay | Admitting: Pulmonary Disease

## 2017-05-10 DIAGNOSIS — T82858A Stenosis of vascular prosthetic devices, implants and grafts, initial encounter: Secondary | ICD-10-CM | POA: Diagnosis not present

## 2017-05-10 DIAGNOSIS — D630 Anemia in neoplastic disease: Secondary | ICD-10-CM | POA: Diagnosis not present

## 2017-05-10 DIAGNOSIS — R17 Unspecified jaundice: Secondary | ICD-10-CM | POA: Diagnosis not present

## 2017-05-10 DIAGNOSIS — E440 Moderate protein-calorie malnutrition: Secondary | ICD-10-CM | POA: Diagnosis not present

## 2017-05-10 DIAGNOSIS — D509 Iron deficiency anemia, unspecified: Secondary | ICD-10-CM | POA: Diagnosis not present

## 2017-05-10 DIAGNOSIS — Z79899 Other long term (current) drug therapy: Secondary | ICD-10-CM | POA: Diagnosis not present

## 2017-05-10 DIAGNOSIS — I871 Compression of vein: Secondary | ICD-10-CM | POA: Diagnosis not present

## 2017-05-10 DIAGNOSIS — N186 End stage renal disease: Secondary | ICD-10-CM | POA: Diagnosis not present

## 2017-05-10 DIAGNOSIS — Z992 Dependence on renal dialysis: Secondary | ICD-10-CM | POA: Diagnosis not present

## 2017-05-10 DIAGNOSIS — D631 Anemia in chronic kidney disease: Secondary | ICD-10-CM | POA: Diagnosis not present

## 2017-05-10 DIAGNOSIS — N2581 Secondary hyperparathyroidism of renal origin: Secondary | ICD-10-CM | POA: Diagnosis not present

## 2017-05-10 DIAGNOSIS — E1129 Type 2 diabetes mellitus with other diabetic kidney complication: Secondary | ICD-10-CM | POA: Diagnosis not present

## 2017-05-10 NOTE — Telephone Encounter (Signed)
lmtcb x1 for Bryan Wilkerson with [facility].

## 2017-05-11 ENCOUNTER — Ambulatory Visit: Payer: Medicare Other | Admitting: Vascular Surgery

## 2017-05-11 DIAGNOSIS — D509 Iron deficiency anemia, unspecified: Secondary | ICD-10-CM | POA: Diagnosis not present

## 2017-05-11 DIAGNOSIS — N2581 Secondary hyperparathyroidism of renal origin: Secondary | ICD-10-CM | POA: Diagnosis not present

## 2017-05-11 DIAGNOSIS — E440 Moderate protein-calorie malnutrition: Secondary | ICD-10-CM | POA: Diagnosis not present

## 2017-05-11 DIAGNOSIS — N186 End stage renal disease: Secondary | ICD-10-CM | POA: Diagnosis not present

## 2017-05-11 DIAGNOSIS — E1129 Type 2 diabetes mellitus with other diabetic kidney complication: Secondary | ICD-10-CM | POA: Diagnosis not present

## 2017-05-11 DIAGNOSIS — D631 Anemia in chronic kidney disease: Secondary | ICD-10-CM | POA: Diagnosis not present

## 2017-05-11 NOTE — Telephone Encounter (Signed)
Called and spoke with Bryan Wilkerson who stated that Bryan Wilkerson is in with a pt and she will have to call us back when she is done.

## 2017-05-12 ENCOUNTER — Encounter: Payer: Self-pay | Admitting: Family Medicine

## 2017-05-12 NOTE — Telephone Encounter (Signed)
Waiting for a call back from [name] about this pt to see what concerns she has.

## 2017-05-13 DIAGNOSIS — E1129 Type 2 diabetes mellitus with other diabetic kidney complication: Secondary | ICD-10-CM | POA: Diagnosis not present

## 2017-05-13 DIAGNOSIS — N2581 Secondary hyperparathyroidism of renal origin: Secondary | ICD-10-CM | POA: Diagnosis not present

## 2017-05-13 DIAGNOSIS — E440 Moderate protein-calorie malnutrition: Secondary | ICD-10-CM | POA: Diagnosis not present

## 2017-05-13 DIAGNOSIS — D631 Anemia in chronic kidney disease: Secondary | ICD-10-CM | POA: Diagnosis not present

## 2017-05-13 DIAGNOSIS — D509 Iron deficiency anemia, unspecified: Secondary | ICD-10-CM | POA: Diagnosis not present

## 2017-05-13 DIAGNOSIS — N186 End stage renal disease: Secondary | ICD-10-CM | POA: Diagnosis not present

## 2017-05-13 NOTE — Telephone Encounter (Signed)
Have left messages for [name] to call back.  Will close this message for now.

## 2017-05-16 DIAGNOSIS — D631 Anemia in chronic kidney disease: Secondary | ICD-10-CM | POA: Diagnosis not present

## 2017-05-16 DIAGNOSIS — N2581 Secondary hyperparathyroidism of renal origin: Secondary | ICD-10-CM | POA: Diagnosis not present

## 2017-05-16 DIAGNOSIS — E440 Moderate protein-calorie malnutrition: Secondary | ICD-10-CM | POA: Diagnosis not present

## 2017-05-16 DIAGNOSIS — D509 Iron deficiency anemia, unspecified: Secondary | ICD-10-CM | POA: Diagnosis not present

## 2017-05-16 DIAGNOSIS — N186 End stage renal disease: Secondary | ICD-10-CM | POA: Diagnosis not present

## 2017-05-16 DIAGNOSIS — E1129 Type 2 diabetes mellitus with other diabetic kidney complication: Secondary | ICD-10-CM | POA: Diagnosis not present

## 2017-05-18 DIAGNOSIS — E1129 Type 2 diabetes mellitus with other diabetic kidney complication: Secondary | ICD-10-CM | POA: Diagnosis not present

## 2017-05-18 DIAGNOSIS — N186 End stage renal disease: Secondary | ICD-10-CM | POA: Diagnosis not present

## 2017-05-18 DIAGNOSIS — D509 Iron deficiency anemia, unspecified: Secondary | ICD-10-CM | POA: Diagnosis not present

## 2017-05-18 DIAGNOSIS — E440 Moderate protein-calorie malnutrition: Secondary | ICD-10-CM | POA: Diagnosis not present

## 2017-05-18 DIAGNOSIS — D631 Anemia in chronic kidney disease: Secondary | ICD-10-CM | POA: Diagnosis not present

## 2017-05-18 DIAGNOSIS — N2581 Secondary hyperparathyroidism of renal origin: Secondary | ICD-10-CM | POA: Diagnosis not present

## 2017-05-22 DIAGNOSIS — E1129 Type 2 diabetes mellitus with other diabetic kidney complication: Secondary | ICD-10-CM | POA: Diagnosis not present

## 2017-05-22 DIAGNOSIS — N2581 Secondary hyperparathyroidism of renal origin: Secondary | ICD-10-CM | POA: Diagnosis not present

## 2017-05-22 DIAGNOSIS — D631 Anemia in chronic kidney disease: Secondary | ICD-10-CM | POA: Diagnosis not present

## 2017-05-22 DIAGNOSIS — D509 Iron deficiency anemia, unspecified: Secondary | ICD-10-CM | POA: Diagnosis not present

## 2017-05-22 DIAGNOSIS — E440 Moderate protein-calorie malnutrition: Secondary | ICD-10-CM | POA: Diagnosis not present

## 2017-05-22 DIAGNOSIS — N186 End stage renal disease: Secondary | ICD-10-CM | POA: Diagnosis not present

## 2017-05-23 ENCOUNTER — Ambulatory Visit (INDEPENDENT_AMBULATORY_CARE_PROVIDER_SITE_OTHER): Payer: Medicare Other

## 2017-05-23 DIAGNOSIS — Z23 Encounter for immunization: Secondary | ICD-10-CM

## 2017-05-23 DIAGNOSIS — Z111 Encounter for screening for respiratory tuberculosis: Secondary | ICD-10-CM

## 2017-05-23 NOTE — Addendum Note (Signed)
Addended by: Bunnie Domino on: 05/23/2017 04:36 PM   Modules accepted: Orders

## 2017-05-24 DIAGNOSIS — N2581 Secondary hyperparathyroidism of renal origin: Secondary | ICD-10-CM | POA: Diagnosis not present

## 2017-05-24 DIAGNOSIS — E1129 Type 2 diabetes mellitus with other diabetic kidney complication: Secondary | ICD-10-CM | POA: Diagnosis not present

## 2017-05-24 DIAGNOSIS — N186 End stage renal disease: Secondary | ICD-10-CM | POA: Diagnosis not present

## 2017-05-24 DIAGNOSIS — D631 Anemia in chronic kidney disease: Secondary | ICD-10-CM | POA: Diagnosis not present

## 2017-05-24 DIAGNOSIS — D509 Iron deficiency anemia, unspecified: Secondary | ICD-10-CM | POA: Diagnosis not present

## 2017-05-24 DIAGNOSIS — E440 Moderate protein-calorie malnutrition: Secondary | ICD-10-CM | POA: Diagnosis not present

## 2017-05-25 ENCOUNTER — Encounter: Payer: Self-pay | Admitting: Family Medicine

## 2017-05-25 ENCOUNTER — Ambulatory Visit: Payer: 59

## 2017-05-25 DIAGNOSIS — Z111 Encounter for screening for respiratory tuberculosis: Secondary | ICD-10-CM

## 2017-05-25 LAB — TB SKIN TEST
INDURATION: 0 mm
TB Skin Test: NEGATIVE

## 2017-05-25 NOTE — Progress Notes (Signed)
Pre visit review using our clinic tool,if applicable. No additional management support is needed unless otherwise documented below in the visit note.   Patient in for PPD reading.  PPD Negative  0 = induration

## 2017-05-27 DIAGNOSIS — D631 Anemia in chronic kidney disease: Secondary | ICD-10-CM | POA: Diagnosis not present

## 2017-05-27 DIAGNOSIS — N186 End stage renal disease: Secondary | ICD-10-CM | POA: Diagnosis not present

## 2017-05-27 DIAGNOSIS — D509 Iron deficiency anemia, unspecified: Secondary | ICD-10-CM | POA: Diagnosis not present

## 2017-05-27 DIAGNOSIS — E440 Moderate protein-calorie malnutrition: Secondary | ICD-10-CM | POA: Diagnosis not present

## 2017-05-27 DIAGNOSIS — E1129 Type 2 diabetes mellitus with other diabetic kidney complication: Secondary | ICD-10-CM | POA: Diagnosis not present

## 2017-05-27 DIAGNOSIS — N2581 Secondary hyperparathyroidism of renal origin: Secondary | ICD-10-CM | POA: Diagnosis not present

## 2017-05-30 DIAGNOSIS — R809 Proteinuria, unspecified: Secondary | ICD-10-CM | POA: Diagnosis not present

## 2017-05-30 DIAGNOSIS — E440 Moderate protein-calorie malnutrition: Secondary | ICD-10-CM | POA: Diagnosis not present

## 2017-05-30 DIAGNOSIS — N2581 Secondary hyperparathyroidism of renal origin: Secondary | ICD-10-CM | POA: Diagnosis not present

## 2017-05-30 DIAGNOSIS — N186 End stage renal disease: Secondary | ICD-10-CM | POA: Diagnosis not present

## 2017-05-30 DIAGNOSIS — D631 Anemia in chronic kidney disease: Secondary | ICD-10-CM | POA: Diagnosis not present

## 2017-05-30 DIAGNOSIS — E1129 Type 2 diabetes mellitus with other diabetic kidney complication: Secondary | ICD-10-CM | POA: Diagnosis not present

## 2017-05-30 DIAGNOSIS — E1165 Type 2 diabetes mellitus with hyperglycemia: Secondary | ICD-10-CM | POA: Diagnosis not present

## 2017-05-30 DIAGNOSIS — I10 Essential (primary) hypertension: Secondary | ICD-10-CM | POA: Diagnosis not present

## 2017-05-30 DIAGNOSIS — D509 Iron deficiency anemia, unspecified: Secondary | ICD-10-CM | POA: Diagnosis not present

## 2017-05-31 DIAGNOSIS — B351 Tinea unguium: Secondary | ICD-10-CM | POA: Diagnosis not present

## 2017-05-31 DIAGNOSIS — E1142 Type 2 diabetes mellitus with diabetic polyneuropathy: Secondary | ICD-10-CM | POA: Diagnosis not present

## 2017-06-01 DIAGNOSIS — N186 End stage renal disease: Secondary | ICD-10-CM | POA: Diagnosis not present

## 2017-06-01 DIAGNOSIS — N2581 Secondary hyperparathyroidism of renal origin: Secondary | ICD-10-CM | POA: Diagnosis not present

## 2017-06-01 DIAGNOSIS — E440 Moderate protein-calorie malnutrition: Secondary | ICD-10-CM | POA: Diagnosis not present

## 2017-06-01 DIAGNOSIS — D509 Iron deficiency anemia, unspecified: Secondary | ICD-10-CM | POA: Diagnosis not present

## 2017-06-01 DIAGNOSIS — D631 Anemia in chronic kidney disease: Secondary | ICD-10-CM | POA: Diagnosis not present

## 2017-06-01 DIAGNOSIS — E1129 Type 2 diabetes mellitus with other diabetic kidney complication: Secondary | ICD-10-CM | POA: Diagnosis not present

## 2017-06-02 ENCOUNTER — Ambulatory Visit
Admission: RE | Admit: 2017-06-02 | Discharge: 2017-06-02 | Disposition: A | Discharge status: Home / Self Care | Payer: 59 | Source: Ambulatory Visit | Attending: Family Medicine | Admitting: Family Medicine

## 2017-06-02 DIAGNOSIS — J189 Pneumonia, unspecified organism: Secondary | ICD-10-CM

## 2017-06-02 DIAGNOSIS — J90 Pleural effusion, not elsewhere classified: Secondary | ICD-10-CM | POA: Diagnosis not present

## 2017-06-03 ENCOUNTER — Encounter: Payer: Self-pay | Admitting: Family Medicine

## 2017-06-03 DIAGNOSIS — Z992 Dependence on renal dialysis: Secondary | ICD-10-CM | POA: Diagnosis not present

## 2017-06-03 DIAGNOSIS — D509 Iron deficiency anemia, unspecified: Secondary | ICD-10-CM | POA: Diagnosis not present

## 2017-06-03 DIAGNOSIS — N2581 Secondary hyperparathyroidism of renal origin: Secondary | ICD-10-CM | POA: Diagnosis not present

## 2017-06-03 DIAGNOSIS — D631 Anemia in chronic kidney disease: Secondary | ICD-10-CM | POA: Diagnosis not present

## 2017-06-03 DIAGNOSIS — E440 Moderate protein-calorie malnutrition: Secondary | ICD-10-CM | POA: Diagnosis not present

## 2017-06-03 DIAGNOSIS — N186 End stage renal disease: Secondary | ICD-10-CM | POA: Diagnosis not present

## 2017-06-03 DIAGNOSIS — E1129 Type 2 diabetes mellitus with other diabetic kidney complication: Secondary | ICD-10-CM | POA: Diagnosis not present

## 2017-06-05 DIAGNOSIS — E1142 Type 2 diabetes mellitus with diabetic polyneuropathy: Secondary | ICD-10-CM | POA: Insufficient documentation

## 2017-06-06 DIAGNOSIS — N186 End stage renal disease: Secondary | ICD-10-CM | POA: Diagnosis not present

## 2017-06-06 DIAGNOSIS — E1129 Type 2 diabetes mellitus with other diabetic kidney complication: Secondary | ICD-10-CM | POA: Diagnosis not present

## 2017-06-07 ENCOUNTER — Encounter: Payer: 59 | Admitting: Thoracic Surgery (Cardiothoracic Vascular Surgery)

## 2017-06-08 DIAGNOSIS — N2581 Secondary hyperparathyroidism of renal origin: Secondary | ICD-10-CM | POA: Diagnosis not present

## 2017-06-08 DIAGNOSIS — N186 End stage renal disease: Secondary | ICD-10-CM | POA: Diagnosis not present

## 2017-06-08 DIAGNOSIS — R82998 Other abnormal findings in urine: Secondary | ICD-10-CM | POA: Diagnosis not present

## 2017-06-08 DIAGNOSIS — Z4932 Encounter for adequacy testing for peritoneal dialysis: Secondary | ICD-10-CM | POA: Diagnosis not present

## 2017-06-08 DIAGNOSIS — K769 Liver disease, unspecified: Secondary | ICD-10-CM | POA: Diagnosis not present

## 2017-06-08 DIAGNOSIS — E7849 Other hyperlipidemia: Secondary | ICD-10-CM | POA: Diagnosis not present

## 2017-06-08 DIAGNOSIS — N2589 Other disorders resulting from impaired renal tubular function: Secondary | ICD-10-CM | POA: Diagnosis not present

## 2017-06-08 DIAGNOSIS — D509 Iron deficiency anemia, unspecified: Secondary | ICD-10-CM | POA: Diagnosis not present

## 2017-06-08 DIAGNOSIS — D630 Anemia in neoplastic disease: Secondary | ICD-10-CM | POA: Diagnosis not present

## 2017-06-09 DIAGNOSIS — N186 End stage renal disease: Secondary | ICD-10-CM | POA: Diagnosis not present

## 2017-06-09 DIAGNOSIS — N2581 Secondary hyperparathyroidism of renal origin: Secondary | ICD-10-CM | POA: Diagnosis not present

## 2017-06-09 DIAGNOSIS — D509 Iron deficiency anemia, unspecified: Secondary | ICD-10-CM | POA: Diagnosis not present

## 2017-06-09 DIAGNOSIS — Z4932 Encounter for adequacy testing for peritoneal dialysis: Secondary | ICD-10-CM | POA: Diagnosis not present

## 2017-06-09 DIAGNOSIS — D630 Anemia in neoplastic disease: Secondary | ICD-10-CM | POA: Diagnosis not present

## 2017-06-09 DIAGNOSIS — K769 Liver disease, unspecified: Secondary | ICD-10-CM | POA: Diagnosis not present

## 2017-06-10 DIAGNOSIS — K769 Liver disease, unspecified: Secondary | ICD-10-CM | POA: Diagnosis not present

## 2017-06-10 DIAGNOSIS — D630 Anemia in neoplastic disease: Secondary | ICD-10-CM | POA: Diagnosis not present

## 2017-06-10 DIAGNOSIS — D509 Iron deficiency anemia, unspecified: Secondary | ICD-10-CM | POA: Diagnosis not present

## 2017-06-10 DIAGNOSIS — Z4932 Encounter for adequacy testing for peritoneal dialysis: Secondary | ICD-10-CM | POA: Diagnosis not present

## 2017-06-10 DIAGNOSIS — N186 End stage renal disease: Secondary | ICD-10-CM | POA: Diagnosis not present

## 2017-06-10 DIAGNOSIS — N2581 Secondary hyperparathyroidism of renal origin: Secondary | ICD-10-CM | POA: Diagnosis not present

## 2017-06-11 DIAGNOSIS — N186 End stage renal disease: Secondary | ICD-10-CM | POA: Diagnosis not present

## 2017-06-11 DIAGNOSIS — D509 Iron deficiency anemia, unspecified: Secondary | ICD-10-CM | POA: Diagnosis not present

## 2017-06-11 DIAGNOSIS — Z4932 Encounter for adequacy testing for peritoneal dialysis: Secondary | ICD-10-CM | POA: Diagnosis not present

## 2017-06-11 DIAGNOSIS — K769 Liver disease, unspecified: Secondary | ICD-10-CM | POA: Diagnosis not present

## 2017-06-11 DIAGNOSIS — N2581 Secondary hyperparathyroidism of renal origin: Secondary | ICD-10-CM | POA: Diagnosis not present

## 2017-06-11 DIAGNOSIS — D630 Anemia in neoplastic disease: Secondary | ICD-10-CM | POA: Diagnosis not present

## 2017-06-12 DIAGNOSIS — N2581 Secondary hyperparathyroidism of renal origin: Secondary | ICD-10-CM | POA: Diagnosis not present

## 2017-06-12 DIAGNOSIS — Z4932 Encounter for adequacy testing for peritoneal dialysis: Secondary | ICD-10-CM | POA: Diagnosis not present

## 2017-06-12 DIAGNOSIS — D630 Anemia in neoplastic disease: Secondary | ICD-10-CM | POA: Diagnosis not present

## 2017-06-12 DIAGNOSIS — D509 Iron deficiency anemia, unspecified: Secondary | ICD-10-CM | POA: Diagnosis not present

## 2017-06-12 DIAGNOSIS — N186 End stage renal disease: Secondary | ICD-10-CM | POA: Diagnosis not present

## 2017-06-12 DIAGNOSIS — K769 Liver disease, unspecified: Secondary | ICD-10-CM | POA: Diagnosis not present

## 2017-06-13 ENCOUNTER — Telehealth: Payer: Self-pay | Admitting: *Deleted

## 2017-06-13 DIAGNOSIS — D630 Anemia in neoplastic disease: Secondary | ICD-10-CM | POA: Diagnosis not present

## 2017-06-13 DIAGNOSIS — D509 Iron deficiency anemia, unspecified: Secondary | ICD-10-CM | POA: Diagnosis not present

## 2017-06-13 DIAGNOSIS — N2581 Secondary hyperparathyroidism of renal origin: Secondary | ICD-10-CM | POA: Diagnosis not present

## 2017-06-13 DIAGNOSIS — K769 Liver disease, unspecified: Secondary | ICD-10-CM | POA: Diagnosis not present

## 2017-06-13 DIAGNOSIS — N186 End stage renal disease: Secondary | ICD-10-CM | POA: Diagnosis not present

## 2017-06-13 DIAGNOSIS — Z4932 Encounter for adequacy testing for peritoneal dialysis: Secondary | ICD-10-CM | POA: Diagnosis not present

## 2017-06-14 DIAGNOSIS — Z4932 Encounter for adequacy testing for peritoneal dialysis: Secondary | ICD-10-CM | POA: Diagnosis not present

## 2017-06-14 DIAGNOSIS — N186 End stage renal disease: Secondary | ICD-10-CM | POA: Diagnosis not present

## 2017-06-14 DIAGNOSIS — K769 Liver disease, unspecified: Secondary | ICD-10-CM | POA: Diagnosis not present

## 2017-06-14 DIAGNOSIS — D630 Anemia in neoplastic disease: Secondary | ICD-10-CM | POA: Diagnosis not present

## 2017-06-14 DIAGNOSIS — D509 Iron deficiency anemia, unspecified: Secondary | ICD-10-CM | POA: Diagnosis not present

## 2017-06-14 DIAGNOSIS — N2581 Secondary hyperparathyroidism of renal origin: Secondary | ICD-10-CM | POA: Diagnosis not present

## 2017-06-15 DIAGNOSIS — K769 Liver disease, unspecified: Secondary | ICD-10-CM | POA: Diagnosis not present

## 2017-06-15 DIAGNOSIS — D509 Iron deficiency anemia, unspecified: Secondary | ICD-10-CM | POA: Diagnosis not present

## 2017-06-15 DIAGNOSIS — N2581 Secondary hyperparathyroidism of renal origin: Secondary | ICD-10-CM | POA: Diagnosis not present

## 2017-06-15 DIAGNOSIS — D630 Anemia in neoplastic disease: Secondary | ICD-10-CM | POA: Diagnosis not present

## 2017-06-15 DIAGNOSIS — N186 End stage renal disease: Secondary | ICD-10-CM | POA: Diagnosis not present

## 2017-06-15 DIAGNOSIS — Z4932 Encounter for adequacy testing for peritoneal dialysis: Secondary | ICD-10-CM | POA: Diagnosis not present

## 2017-06-16 DIAGNOSIS — Z4932 Encounter for adequacy testing for peritoneal dialysis: Secondary | ICD-10-CM | POA: Diagnosis not present

## 2017-06-16 DIAGNOSIS — D509 Iron deficiency anemia, unspecified: Secondary | ICD-10-CM | POA: Diagnosis not present

## 2017-06-16 DIAGNOSIS — N186 End stage renal disease: Secondary | ICD-10-CM | POA: Diagnosis not present

## 2017-06-16 DIAGNOSIS — N2581 Secondary hyperparathyroidism of renal origin: Secondary | ICD-10-CM | POA: Diagnosis not present

## 2017-06-16 DIAGNOSIS — D630 Anemia in neoplastic disease: Secondary | ICD-10-CM | POA: Diagnosis not present

## 2017-06-16 DIAGNOSIS — K769 Liver disease, unspecified: Secondary | ICD-10-CM | POA: Diagnosis not present

## 2017-06-17 DIAGNOSIS — N186 End stage renal disease: Secondary | ICD-10-CM | POA: Diagnosis not present

## 2017-06-17 DIAGNOSIS — K769 Liver disease, unspecified: Secondary | ICD-10-CM | POA: Diagnosis not present

## 2017-06-17 DIAGNOSIS — D509 Iron deficiency anemia, unspecified: Secondary | ICD-10-CM | POA: Diagnosis not present

## 2017-06-17 DIAGNOSIS — N2581 Secondary hyperparathyroidism of renal origin: Secondary | ICD-10-CM | POA: Diagnosis not present

## 2017-06-17 DIAGNOSIS — D630 Anemia in neoplastic disease: Secondary | ICD-10-CM | POA: Diagnosis not present

## 2017-06-17 DIAGNOSIS — Z4932 Encounter for adequacy testing for peritoneal dialysis: Secondary | ICD-10-CM | POA: Diagnosis not present

## 2017-06-18 DIAGNOSIS — N186 End stage renal disease: Secondary | ICD-10-CM | POA: Diagnosis not present

## 2017-06-18 DIAGNOSIS — D630 Anemia in neoplastic disease: Secondary | ICD-10-CM | POA: Diagnosis not present

## 2017-06-18 DIAGNOSIS — Z4932 Encounter for adequacy testing for peritoneal dialysis: Secondary | ICD-10-CM | POA: Diagnosis not present

## 2017-06-18 DIAGNOSIS — K769 Liver disease, unspecified: Secondary | ICD-10-CM | POA: Diagnosis not present

## 2017-06-18 DIAGNOSIS — N2581 Secondary hyperparathyroidism of renal origin: Secondary | ICD-10-CM | POA: Diagnosis not present

## 2017-06-18 DIAGNOSIS — D509 Iron deficiency anemia, unspecified: Secondary | ICD-10-CM | POA: Diagnosis not present

## 2017-06-19 DIAGNOSIS — N2581 Secondary hyperparathyroidism of renal origin: Secondary | ICD-10-CM | POA: Diagnosis not present

## 2017-06-19 DIAGNOSIS — D509 Iron deficiency anemia, unspecified: Secondary | ICD-10-CM | POA: Diagnosis not present

## 2017-06-19 DIAGNOSIS — K769 Liver disease, unspecified: Secondary | ICD-10-CM | POA: Diagnosis not present

## 2017-06-19 DIAGNOSIS — Z4932 Encounter for adequacy testing for peritoneal dialysis: Secondary | ICD-10-CM | POA: Diagnosis not present

## 2017-06-19 DIAGNOSIS — N186 End stage renal disease: Secondary | ICD-10-CM | POA: Diagnosis not present

## 2017-06-19 DIAGNOSIS — D630 Anemia in neoplastic disease: Secondary | ICD-10-CM | POA: Diagnosis not present

## 2017-06-20 DIAGNOSIS — Z4932 Encounter for adequacy testing for peritoneal dialysis: Secondary | ICD-10-CM | POA: Diagnosis not present

## 2017-06-20 DIAGNOSIS — D630 Anemia in neoplastic disease: Secondary | ICD-10-CM | POA: Diagnosis not present

## 2017-06-20 DIAGNOSIS — D509 Iron deficiency anemia, unspecified: Secondary | ICD-10-CM | POA: Diagnosis not present

## 2017-06-20 DIAGNOSIS — K769 Liver disease, unspecified: Secondary | ICD-10-CM | POA: Diagnosis not present

## 2017-06-20 DIAGNOSIS — N2581 Secondary hyperparathyroidism of renal origin: Secondary | ICD-10-CM | POA: Diagnosis not present

## 2017-06-20 DIAGNOSIS — N186 End stage renal disease: Secondary | ICD-10-CM | POA: Diagnosis not present

## 2017-06-21 DIAGNOSIS — N186 End stage renal disease: Secondary | ICD-10-CM | POA: Diagnosis not present

## 2017-06-21 DIAGNOSIS — D630 Anemia in neoplastic disease: Secondary | ICD-10-CM | POA: Diagnosis not present

## 2017-06-21 DIAGNOSIS — D509 Iron deficiency anemia, unspecified: Secondary | ICD-10-CM | POA: Diagnosis not present

## 2017-06-21 DIAGNOSIS — N2581 Secondary hyperparathyroidism of renal origin: Secondary | ICD-10-CM | POA: Diagnosis not present

## 2017-06-21 DIAGNOSIS — Z4932 Encounter for adequacy testing for peritoneal dialysis: Secondary | ICD-10-CM | POA: Diagnosis not present

## 2017-06-21 DIAGNOSIS — K769 Liver disease, unspecified: Secondary | ICD-10-CM | POA: Diagnosis not present

## 2017-06-22 DIAGNOSIS — D509 Iron deficiency anemia, unspecified: Secondary | ICD-10-CM | POA: Diagnosis not present

## 2017-06-22 DIAGNOSIS — K769 Liver disease, unspecified: Secondary | ICD-10-CM | POA: Diagnosis not present

## 2017-06-22 DIAGNOSIS — N2581 Secondary hyperparathyroidism of renal origin: Secondary | ICD-10-CM | POA: Diagnosis not present

## 2017-06-22 DIAGNOSIS — Z4932 Encounter for adequacy testing for peritoneal dialysis: Secondary | ICD-10-CM | POA: Diagnosis not present

## 2017-06-22 DIAGNOSIS — D630 Anemia in neoplastic disease: Secondary | ICD-10-CM | POA: Diagnosis not present

## 2017-06-22 DIAGNOSIS — N186 End stage renal disease: Secondary | ICD-10-CM | POA: Diagnosis not present

## 2017-06-23 DIAGNOSIS — D509 Iron deficiency anemia, unspecified: Secondary | ICD-10-CM | POA: Diagnosis not present

## 2017-06-23 DIAGNOSIS — K769 Liver disease, unspecified: Secondary | ICD-10-CM | POA: Diagnosis not present

## 2017-06-23 DIAGNOSIS — D630 Anemia in neoplastic disease: Secondary | ICD-10-CM | POA: Diagnosis not present

## 2017-06-23 DIAGNOSIS — N186 End stage renal disease: Secondary | ICD-10-CM | POA: Diagnosis not present

## 2017-06-23 DIAGNOSIS — Z4932 Encounter for adequacy testing for peritoneal dialysis: Secondary | ICD-10-CM | POA: Diagnosis not present

## 2017-06-23 DIAGNOSIS — N2581 Secondary hyperparathyroidism of renal origin: Secondary | ICD-10-CM | POA: Diagnosis not present

## 2017-06-24 DIAGNOSIS — N186 End stage renal disease: Secondary | ICD-10-CM | POA: Diagnosis not present

## 2017-06-24 DIAGNOSIS — D509 Iron deficiency anemia, unspecified: Secondary | ICD-10-CM | POA: Diagnosis not present

## 2017-06-24 DIAGNOSIS — D630 Anemia in neoplastic disease: Secondary | ICD-10-CM | POA: Diagnosis not present

## 2017-06-24 DIAGNOSIS — K769 Liver disease, unspecified: Secondary | ICD-10-CM | POA: Diagnosis not present

## 2017-06-24 DIAGNOSIS — Z4932 Encounter for adequacy testing for peritoneal dialysis: Secondary | ICD-10-CM | POA: Diagnosis not present

## 2017-06-24 DIAGNOSIS — N2581 Secondary hyperparathyroidism of renal origin: Secondary | ICD-10-CM | POA: Diagnosis not present

## 2017-06-25 DIAGNOSIS — N2581 Secondary hyperparathyroidism of renal origin: Secondary | ICD-10-CM | POA: Diagnosis not present

## 2017-06-25 DIAGNOSIS — K769 Liver disease, unspecified: Secondary | ICD-10-CM | POA: Diagnosis not present

## 2017-06-25 DIAGNOSIS — D630 Anemia in neoplastic disease: Secondary | ICD-10-CM | POA: Diagnosis not present

## 2017-06-25 DIAGNOSIS — D509 Iron deficiency anemia, unspecified: Secondary | ICD-10-CM | POA: Diagnosis not present

## 2017-06-25 DIAGNOSIS — Z4932 Encounter for adequacy testing for peritoneal dialysis: Secondary | ICD-10-CM | POA: Diagnosis not present

## 2017-06-25 DIAGNOSIS — N186 End stage renal disease: Secondary | ICD-10-CM | POA: Diagnosis not present

## 2017-06-26 DIAGNOSIS — D509 Iron deficiency anemia, unspecified: Secondary | ICD-10-CM | POA: Diagnosis not present

## 2017-06-26 DIAGNOSIS — K769 Liver disease, unspecified: Secondary | ICD-10-CM | POA: Diagnosis not present

## 2017-06-26 DIAGNOSIS — N186 End stage renal disease: Secondary | ICD-10-CM | POA: Diagnosis not present

## 2017-06-26 DIAGNOSIS — Z4932 Encounter for adequacy testing for peritoneal dialysis: Secondary | ICD-10-CM | POA: Diagnosis not present

## 2017-06-26 DIAGNOSIS — D630 Anemia in neoplastic disease: Secondary | ICD-10-CM | POA: Diagnosis not present

## 2017-06-26 DIAGNOSIS — N2581 Secondary hyperparathyroidism of renal origin: Secondary | ICD-10-CM | POA: Diagnosis not present

## 2017-06-27 DIAGNOSIS — K769 Liver disease, unspecified: Secondary | ICD-10-CM | POA: Diagnosis not present

## 2017-06-27 DIAGNOSIS — N186 End stage renal disease: Secondary | ICD-10-CM | POA: Diagnosis not present

## 2017-06-27 DIAGNOSIS — Z4932 Encounter for adequacy testing for peritoneal dialysis: Secondary | ICD-10-CM | POA: Diagnosis not present

## 2017-06-27 DIAGNOSIS — D509 Iron deficiency anemia, unspecified: Secondary | ICD-10-CM | POA: Diagnosis not present

## 2017-06-27 DIAGNOSIS — N2581 Secondary hyperparathyroidism of renal origin: Secondary | ICD-10-CM | POA: Diagnosis not present

## 2017-06-27 DIAGNOSIS — D630 Anemia in neoplastic disease: Secondary | ICD-10-CM | POA: Diagnosis not present

## 2017-06-28 DIAGNOSIS — N2581 Secondary hyperparathyroidism of renal origin: Secondary | ICD-10-CM | POA: Diagnosis not present

## 2017-06-28 DIAGNOSIS — Z4932 Encounter for adequacy testing for peritoneal dialysis: Secondary | ICD-10-CM | POA: Diagnosis not present

## 2017-06-28 DIAGNOSIS — K769 Liver disease, unspecified: Secondary | ICD-10-CM | POA: Diagnosis not present

## 2017-06-28 DIAGNOSIS — D509 Iron deficiency anemia, unspecified: Secondary | ICD-10-CM | POA: Diagnosis not present

## 2017-06-28 DIAGNOSIS — N186 End stage renal disease: Secondary | ICD-10-CM | POA: Diagnosis not present

## 2017-06-28 DIAGNOSIS — D630 Anemia in neoplastic disease: Secondary | ICD-10-CM | POA: Diagnosis not present

## 2017-06-29 DIAGNOSIS — Z4932 Encounter for adequacy testing for peritoneal dialysis: Secondary | ICD-10-CM | POA: Diagnosis not present

## 2017-06-29 DIAGNOSIS — N2581 Secondary hyperparathyroidism of renal origin: Secondary | ICD-10-CM | POA: Diagnosis not present

## 2017-06-29 DIAGNOSIS — N186 End stage renal disease: Secondary | ICD-10-CM | POA: Diagnosis not present

## 2017-06-29 DIAGNOSIS — K769 Liver disease, unspecified: Secondary | ICD-10-CM | POA: Diagnosis not present

## 2017-06-29 DIAGNOSIS — D630 Anemia in neoplastic disease: Secondary | ICD-10-CM | POA: Diagnosis not present

## 2017-06-29 DIAGNOSIS — D509 Iron deficiency anemia, unspecified: Secondary | ICD-10-CM | POA: Diagnosis not present

## 2017-06-30 DIAGNOSIS — N186 End stage renal disease: Secondary | ICD-10-CM | POA: Diagnosis not present

## 2017-06-30 DIAGNOSIS — D630 Anemia in neoplastic disease: Secondary | ICD-10-CM | POA: Diagnosis not present

## 2017-06-30 DIAGNOSIS — K769 Liver disease, unspecified: Secondary | ICD-10-CM | POA: Diagnosis not present

## 2017-06-30 DIAGNOSIS — Z4932 Encounter for adequacy testing for peritoneal dialysis: Secondary | ICD-10-CM | POA: Diagnosis not present

## 2017-06-30 DIAGNOSIS — D509 Iron deficiency anemia, unspecified: Secondary | ICD-10-CM | POA: Diagnosis not present

## 2017-06-30 DIAGNOSIS — N2581 Secondary hyperparathyroidism of renal origin: Secondary | ICD-10-CM | POA: Diagnosis not present

## 2017-07-01 DIAGNOSIS — N186 End stage renal disease: Secondary | ICD-10-CM | POA: Diagnosis not present

## 2017-07-01 DIAGNOSIS — D630 Anemia in neoplastic disease: Secondary | ICD-10-CM | POA: Diagnosis not present

## 2017-07-01 DIAGNOSIS — Z4932 Encounter for adequacy testing for peritoneal dialysis: Secondary | ICD-10-CM | POA: Diagnosis not present

## 2017-07-01 DIAGNOSIS — D509 Iron deficiency anemia, unspecified: Secondary | ICD-10-CM | POA: Diagnosis not present

## 2017-07-01 DIAGNOSIS — K769 Liver disease, unspecified: Secondary | ICD-10-CM | POA: Diagnosis not present

## 2017-07-01 DIAGNOSIS — N2581 Secondary hyperparathyroidism of renal origin: Secondary | ICD-10-CM | POA: Diagnosis not present

## 2017-07-02 DIAGNOSIS — K769 Liver disease, unspecified: Secondary | ICD-10-CM | POA: Diagnosis not present

## 2017-07-02 DIAGNOSIS — N2581 Secondary hyperparathyroidism of renal origin: Secondary | ICD-10-CM | POA: Diagnosis not present

## 2017-07-02 DIAGNOSIS — D630 Anemia in neoplastic disease: Secondary | ICD-10-CM | POA: Diagnosis not present

## 2017-07-02 DIAGNOSIS — Z4932 Encounter for adequacy testing for peritoneal dialysis: Secondary | ICD-10-CM | POA: Diagnosis not present

## 2017-07-02 DIAGNOSIS — D509 Iron deficiency anemia, unspecified: Secondary | ICD-10-CM | POA: Diagnosis not present

## 2017-07-02 DIAGNOSIS — N186 End stage renal disease: Secondary | ICD-10-CM | POA: Diagnosis not present

## 2017-07-03 DIAGNOSIS — D509 Iron deficiency anemia, unspecified: Secondary | ICD-10-CM | POA: Diagnosis not present

## 2017-07-03 DIAGNOSIS — N2581 Secondary hyperparathyroidism of renal origin: Secondary | ICD-10-CM | POA: Diagnosis not present

## 2017-07-03 DIAGNOSIS — K769 Liver disease, unspecified: Secondary | ICD-10-CM | POA: Diagnosis not present

## 2017-07-03 DIAGNOSIS — N186 End stage renal disease: Secondary | ICD-10-CM | POA: Diagnosis not present

## 2017-07-03 DIAGNOSIS — D630 Anemia in neoplastic disease: Secondary | ICD-10-CM | POA: Diagnosis not present

## 2017-07-03 DIAGNOSIS — Z4932 Encounter for adequacy testing for peritoneal dialysis: Secondary | ICD-10-CM | POA: Diagnosis not present

## 2017-07-04 DIAGNOSIS — Z992 Dependence on renal dialysis: Secondary | ICD-10-CM | POA: Diagnosis not present

## 2017-07-04 DIAGNOSIS — Z4932 Encounter for adequacy testing for peritoneal dialysis: Secondary | ICD-10-CM | POA: Diagnosis not present

## 2017-07-04 DIAGNOSIS — D630 Anemia in neoplastic disease: Secondary | ICD-10-CM | POA: Diagnosis not present

## 2017-07-04 DIAGNOSIS — N186 End stage renal disease: Secondary | ICD-10-CM | POA: Diagnosis not present

## 2017-07-04 DIAGNOSIS — E1129 Type 2 diabetes mellitus with other diabetic kidney complication: Secondary | ICD-10-CM | POA: Diagnosis not present

## 2017-07-04 DIAGNOSIS — K769 Liver disease, unspecified: Secondary | ICD-10-CM | POA: Diagnosis not present

## 2017-07-04 DIAGNOSIS — D509 Iron deficiency anemia, unspecified: Secondary | ICD-10-CM | POA: Diagnosis not present

## 2017-07-04 DIAGNOSIS — N2581 Secondary hyperparathyroidism of renal origin: Secondary | ICD-10-CM | POA: Diagnosis not present

## 2017-07-05 DIAGNOSIS — N2589 Other disorders resulting from impaired renal tubular function: Secondary | ICD-10-CM | POA: Diagnosis not present

## 2017-07-05 DIAGNOSIS — Z4932 Encounter for adequacy testing for peritoneal dialysis: Secondary | ICD-10-CM | POA: Diagnosis not present

## 2017-07-05 DIAGNOSIS — D631 Anemia in chronic kidney disease: Secondary | ICD-10-CM | POA: Diagnosis not present

## 2017-07-05 DIAGNOSIS — D509 Iron deficiency anemia, unspecified: Secondary | ICD-10-CM | POA: Diagnosis not present

## 2017-07-05 DIAGNOSIS — K769 Liver disease, unspecified: Secondary | ICD-10-CM | POA: Diagnosis not present

## 2017-07-05 DIAGNOSIS — N2581 Secondary hyperparathyroidism of renal origin: Secondary | ICD-10-CM | POA: Diagnosis not present

## 2017-07-05 DIAGNOSIS — D630 Anemia in neoplastic disease: Secondary | ICD-10-CM | POA: Diagnosis not present

## 2017-07-05 DIAGNOSIS — N186 End stage renal disease: Secondary | ICD-10-CM | POA: Diagnosis not present

## 2017-07-06 DIAGNOSIS — N186 End stage renal disease: Secondary | ICD-10-CM | POA: Diagnosis not present

## 2017-07-06 DIAGNOSIS — D509 Iron deficiency anemia, unspecified: Secondary | ICD-10-CM | POA: Diagnosis not present

## 2017-07-06 DIAGNOSIS — E7849 Other hyperlipidemia: Secondary | ICD-10-CM | POA: Diagnosis not present

## 2017-07-06 DIAGNOSIS — N2581 Secondary hyperparathyroidism of renal origin: Secondary | ICD-10-CM | POA: Diagnosis not present

## 2017-07-06 DIAGNOSIS — K769 Liver disease, unspecified: Secondary | ICD-10-CM | POA: Diagnosis not present

## 2017-07-06 DIAGNOSIS — R82998 Other abnormal findings in urine: Secondary | ICD-10-CM | POA: Diagnosis not present

## 2017-07-06 DIAGNOSIS — D631 Anemia in chronic kidney disease: Secondary | ICD-10-CM | POA: Diagnosis not present

## 2017-07-06 DIAGNOSIS — E1129 Type 2 diabetes mellitus with other diabetic kidney complication: Secondary | ICD-10-CM | POA: Diagnosis not present

## 2017-07-06 DIAGNOSIS — Z4932 Encounter for adequacy testing for peritoneal dialysis: Secondary | ICD-10-CM | POA: Diagnosis not present

## 2017-07-07 DIAGNOSIS — N2581 Secondary hyperparathyroidism of renal origin: Secondary | ICD-10-CM | POA: Diagnosis not present

## 2017-07-07 DIAGNOSIS — K769 Liver disease, unspecified: Secondary | ICD-10-CM | POA: Diagnosis not present

## 2017-07-07 DIAGNOSIS — N186 End stage renal disease: Secondary | ICD-10-CM | POA: Diagnosis not present

## 2017-07-07 DIAGNOSIS — D509 Iron deficiency anemia, unspecified: Secondary | ICD-10-CM | POA: Diagnosis not present

## 2017-07-07 DIAGNOSIS — Z4932 Encounter for adequacy testing for peritoneal dialysis: Secondary | ICD-10-CM | POA: Diagnosis not present

## 2017-07-07 DIAGNOSIS — D631 Anemia in chronic kidney disease: Secondary | ICD-10-CM | POA: Diagnosis not present

## 2017-07-08 DIAGNOSIS — D509 Iron deficiency anemia, unspecified: Secondary | ICD-10-CM | POA: Diagnosis not present

## 2017-07-08 DIAGNOSIS — Z4932 Encounter for adequacy testing for peritoneal dialysis: Secondary | ICD-10-CM | POA: Diagnosis not present

## 2017-07-08 DIAGNOSIS — N2581 Secondary hyperparathyroidism of renal origin: Secondary | ICD-10-CM | POA: Diagnosis not present

## 2017-07-08 DIAGNOSIS — D631 Anemia in chronic kidney disease: Secondary | ICD-10-CM | POA: Diagnosis not present

## 2017-07-08 DIAGNOSIS — N186 End stage renal disease: Secondary | ICD-10-CM | POA: Diagnosis not present

## 2017-07-08 DIAGNOSIS — K769 Liver disease, unspecified: Secondary | ICD-10-CM | POA: Diagnosis not present

## 2017-07-09 DIAGNOSIS — D509 Iron deficiency anemia, unspecified: Secondary | ICD-10-CM | POA: Diagnosis not present

## 2017-07-09 DIAGNOSIS — N2581 Secondary hyperparathyroidism of renal origin: Secondary | ICD-10-CM | POA: Diagnosis not present

## 2017-07-09 DIAGNOSIS — D631 Anemia in chronic kidney disease: Secondary | ICD-10-CM | POA: Diagnosis not present

## 2017-07-09 DIAGNOSIS — Z4932 Encounter for adequacy testing for peritoneal dialysis: Secondary | ICD-10-CM | POA: Diagnosis not present

## 2017-07-09 DIAGNOSIS — K769 Liver disease, unspecified: Secondary | ICD-10-CM | POA: Diagnosis not present

## 2017-07-09 DIAGNOSIS — N186 End stage renal disease: Secondary | ICD-10-CM | POA: Diagnosis not present

## 2017-07-10 DIAGNOSIS — D631 Anemia in chronic kidney disease: Secondary | ICD-10-CM | POA: Diagnosis not present

## 2017-07-10 DIAGNOSIS — N2581 Secondary hyperparathyroidism of renal origin: Secondary | ICD-10-CM | POA: Diagnosis not present

## 2017-07-10 DIAGNOSIS — Z4932 Encounter for adequacy testing for peritoneal dialysis: Secondary | ICD-10-CM | POA: Diagnosis not present

## 2017-07-10 DIAGNOSIS — N186 End stage renal disease: Secondary | ICD-10-CM | POA: Diagnosis not present

## 2017-07-10 DIAGNOSIS — D509 Iron deficiency anemia, unspecified: Secondary | ICD-10-CM | POA: Diagnosis not present

## 2017-07-10 DIAGNOSIS — K769 Liver disease, unspecified: Secondary | ICD-10-CM | POA: Diagnosis not present

## 2017-07-11 DIAGNOSIS — D631 Anemia in chronic kidney disease: Secondary | ICD-10-CM | POA: Diagnosis not present

## 2017-07-11 DIAGNOSIS — D509 Iron deficiency anemia, unspecified: Secondary | ICD-10-CM | POA: Diagnosis not present

## 2017-07-11 DIAGNOSIS — N186 End stage renal disease: Secondary | ICD-10-CM | POA: Diagnosis not present

## 2017-07-11 DIAGNOSIS — Z4932 Encounter for adequacy testing for peritoneal dialysis: Secondary | ICD-10-CM | POA: Diagnosis not present

## 2017-07-11 DIAGNOSIS — N2581 Secondary hyperparathyroidism of renal origin: Secondary | ICD-10-CM | POA: Diagnosis not present

## 2017-07-11 DIAGNOSIS — K769 Liver disease, unspecified: Secondary | ICD-10-CM | POA: Diagnosis not present

## 2017-07-12 DIAGNOSIS — N186 End stage renal disease: Secondary | ICD-10-CM | POA: Diagnosis not present

## 2017-07-12 DIAGNOSIS — K769 Liver disease, unspecified: Secondary | ICD-10-CM | POA: Diagnosis not present

## 2017-07-12 DIAGNOSIS — Z4932 Encounter for adequacy testing for peritoneal dialysis: Secondary | ICD-10-CM | POA: Diagnosis not present

## 2017-07-12 DIAGNOSIS — N2581 Secondary hyperparathyroidism of renal origin: Secondary | ICD-10-CM | POA: Diagnosis not present

## 2017-07-12 DIAGNOSIS — D631 Anemia in chronic kidney disease: Secondary | ICD-10-CM | POA: Diagnosis not present

## 2017-07-12 DIAGNOSIS — D509 Iron deficiency anemia, unspecified: Secondary | ICD-10-CM | POA: Diagnosis not present

## 2017-07-13 DIAGNOSIS — D631 Anemia in chronic kidney disease: Secondary | ICD-10-CM | POA: Diagnosis not present

## 2017-07-13 DIAGNOSIS — K769 Liver disease, unspecified: Secondary | ICD-10-CM | POA: Diagnosis not present

## 2017-07-13 DIAGNOSIS — N186 End stage renal disease: Secondary | ICD-10-CM | POA: Diagnosis not present

## 2017-07-13 DIAGNOSIS — N2581 Secondary hyperparathyroidism of renal origin: Secondary | ICD-10-CM | POA: Diagnosis not present

## 2017-07-13 DIAGNOSIS — D509 Iron deficiency anemia, unspecified: Secondary | ICD-10-CM | POA: Diagnosis not present

## 2017-07-13 DIAGNOSIS — Z4932 Encounter for adequacy testing for peritoneal dialysis: Secondary | ICD-10-CM | POA: Diagnosis not present

## 2017-07-14 ENCOUNTER — Ambulatory Visit
Admission: RE | Admit: 2017-07-14 | Discharge: 2017-07-14 | Disposition: A | Discharge status: Home / Self Care | Payer: 59 | Source: Ambulatory Visit | Attending: Nephrology | Admitting: Nephrology

## 2017-07-14 ENCOUNTER — Other Ambulatory Visit: Payer: Self-pay | Admitting: Nephrology

## 2017-07-14 DIAGNOSIS — Z4932 Encounter for adequacy testing for peritoneal dialysis: Secondary | ICD-10-CM | POA: Diagnosis not present

## 2017-07-14 DIAGNOSIS — D631 Anemia in chronic kidney disease: Secondary | ICD-10-CM | POA: Diagnosis not present

## 2017-07-14 DIAGNOSIS — N186 End stage renal disease: Secondary | ICD-10-CM | POA: Diagnosis not present

## 2017-07-14 DIAGNOSIS — D509 Iron deficiency anemia, unspecified: Secondary | ICD-10-CM | POA: Diagnosis not present

## 2017-07-14 DIAGNOSIS — K769 Liver disease, unspecified: Secondary | ICD-10-CM | POA: Diagnosis not present

## 2017-07-14 DIAGNOSIS — J189 Pneumonia, unspecified organism: Secondary | ICD-10-CM | POA: Diagnosis not present

## 2017-07-14 DIAGNOSIS — K651 Peritoneal abscess: Secondary | ICD-10-CM

## 2017-07-14 DIAGNOSIS — N2581 Secondary hyperparathyroidism of renal origin: Secondary | ICD-10-CM | POA: Diagnosis not present

## 2017-07-15 DIAGNOSIS — D631 Anemia in chronic kidney disease: Secondary | ICD-10-CM | POA: Diagnosis not present

## 2017-07-15 DIAGNOSIS — D509 Iron deficiency anemia, unspecified: Secondary | ICD-10-CM | POA: Diagnosis not present

## 2017-07-15 DIAGNOSIS — N2581 Secondary hyperparathyroidism of renal origin: Secondary | ICD-10-CM | POA: Diagnosis not present

## 2017-07-15 DIAGNOSIS — Z4932 Encounter for adequacy testing for peritoneal dialysis: Secondary | ICD-10-CM | POA: Diagnosis not present

## 2017-07-15 DIAGNOSIS — K769 Liver disease, unspecified: Secondary | ICD-10-CM | POA: Diagnosis not present

## 2017-07-15 DIAGNOSIS — N186 End stage renal disease: Secondary | ICD-10-CM | POA: Diagnosis not present

## 2017-07-16 DIAGNOSIS — K769 Liver disease, unspecified: Secondary | ICD-10-CM | POA: Diagnosis not present

## 2017-07-16 DIAGNOSIS — D631 Anemia in chronic kidney disease: Secondary | ICD-10-CM | POA: Diagnosis not present

## 2017-07-16 DIAGNOSIS — Z4932 Encounter for adequacy testing for peritoneal dialysis: Secondary | ICD-10-CM | POA: Diagnosis not present

## 2017-07-16 DIAGNOSIS — N186 End stage renal disease: Secondary | ICD-10-CM | POA: Diagnosis not present

## 2017-07-16 DIAGNOSIS — D509 Iron deficiency anemia, unspecified: Secondary | ICD-10-CM | POA: Diagnosis not present

## 2017-07-16 DIAGNOSIS — N2581 Secondary hyperparathyroidism of renal origin: Secondary | ICD-10-CM | POA: Diagnosis not present

## 2017-07-17 DIAGNOSIS — N186 End stage renal disease: Secondary | ICD-10-CM | POA: Diagnosis not present

## 2017-07-17 DIAGNOSIS — D631 Anemia in chronic kidney disease: Secondary | ICD-10-CM | POA: Diagnosis not present

## 2017-07-17 DIAGNOSIS — K769 Liver disease, unspecified: Secondary | ICD-10-CM | POA: Diagnosis not present

## 2017-07-17 DIAGNOSIS — N2581 Secondary hyperparathyroidism of renal origin: Secondary | ICD-10-CM | POA: Diagnosis not present

## 2017-07-17 DIAGNOSIS — Z4932 Encounter for adequacy testing for peritoneal dialysis: Secondary | ICD-10-CM | POA: Diagnosis not present

## 2017-07-17 DIAGNOSIS — D509 Iron deficiency anemia, unspecified: Secondary | ICD-10-CM | POA: Diagnosis not present

## 2017-07-18 DIAGNOSIS — N2581 Secondary hyperparathyroidism of renal origin: Secondary | ICD-10-CM | POA: Diagnosis not present

## 2017-07-18 DIAGNOSIS — Z4932 Encounter for adequacy testing for peritoneal dialysis: Secondary | ICD-10-CM | POA: Diagnosis not present

## 2017-07-18 DIAGNOSIS — D509 Iron deficiency anemia, unspecified: Secondary | ICD-10-CM | POA: Diagnosis not present

## 2017-07-18 DIAGNOSIS — K769 Liver disease, unspecified: Secondary | ICD-10-CM | POA: Diagnosis not present

## 2017-07-18 DIAGNOSIS — D631 Anemia in chronic kidney disease: Secondary | ICD-10-CM | POA: Diagnosis not present

## 2017-07-18 DIAGNOSIS — N186 End stage renal disease: Secondary | ICD-10-CM | POA: Diagnosis not present

## 2017-07-19 DIAGNOSIS — D509 Iron deficiency anemia, unspecified: Secondary | ICD-10-CM | POA: Diagnosis not present

## 2017-07-19 DIAGNOSIS — N2581 Secondary hyperparathyroidism of renal origin: Secondary | ICD-10-CM | POA: Diagnosis not present

## 2017-07-19 DIAGNOSIS — N186 End stage renal disease: Secondary | ICD-10-CM | POA: Diagnosis not present

## 2017-07-19 DIAGNOSIS — K769 Liver disease, unspecified: Secondary | ICD-10-CM | POA: Diagnosis not present

## 2017-07-19 DIAGNOSIS — D631 Anemia in chronic kidney disease: Secondary | ICD-10-CM | POA: Diagnosis not present

## 2017-07-19 DIAGNOSIS — Z4932 Encounter for adequacy testing for peritoneal dialysis: Secondary | ICD-10-CM | POA: Diagnosis not present

## 2017-07-20 DIAGNOSIS — N186 End stage renal disease: Secondary | ICD-10-CM | POA: Diagnosis not present

## 2017-07-20 DIAGNOSIS — Z4932 Encounter for adequacy testing for peritoneal dialysis: Secondary | ICD-10-CM | POA: Diagnosis not present

## 2017-07-20 DIAGNOSIS — D631 Anemia in chronic kidney disease: Secondary | ICD-10-CM | POA: Diagnosis not present

## 2017-07-20 DIAGNOSIS — K769 Liver disease, unspecified: Secondary | ICD-10-CM | POA: Diagnosis not present

## 2017-07-20 DIAGNOSIS — D509 Iron deficiency anemia, unspecified: Secondary | ICD-10-CM | POA: Diagnosis not present

## 2017-07-20 DIAGNOSIS — N2581 Secondary hyperparathyroidism of renal origin: Secondary | ICD-10-CM | POA: Diagnosis not present

## 2017-07-21 DIAGNOSIS — D509 Iron deficiency anemia, unspecified: Secondary | ICD-10-CM | POA: Diagnosis not present

## 2017-07-21 DIAGNOSIS — N186 End stage renal disease: Secondary | ICD-10-CM | POA: Diagnosis not present

## 2017-07-21 DIAGNOSIS — N2581 Secondary hyperparathyroidism of renal origin: Secondary | ICD-10-CM | POA: Diagnosis not present

## 2017-07-21 DIAGNOSIS — Z4932 Encounter for adequacy testing for peritoneal dialysis: Secondary | ICD-10-CM | POA: Diagnosis not present

## 2017-07-21 DIAGNOSIS — K769 Liver disease, unspecified: Secondary | ICD-10-CM | POA: Diagnosis not present

## 2017-07-21 DIAGNOSIS — D631 Anemia in chronic kidney disease: Secondary | ICD-10-CM | POA: Diagnosis not present

## 2017-07-22 DIAGNOSIS — D631 Anemia in chronic kidney disease: Secondary | ICD-10-CM | POA: Diagnosis not present

## 2017-07-22 DIAGNOSIS — D509 Iron deficiency anemia, unspecified: Secondary | ICD-10-CM | POA: Diagnosis not present

## 2017-07-22 DIAGNOSIS — K769 Liver disease, unspecified: Secondary | ICD-10-CM | POA: Diagnosis not present

## 2017-07-22 DIAGNOSIS — N186 End stage renal disease: Secondary | ICD-10-CM | POA: Diagnosis not present

## 2017-07-22 DIAGNOSIS — Z4932 Encounter for adequacy testing for peritoneal dialysis: Secondary | ICD-10-CM | POA: Diagnosis not present

## 2017-07-22 DIAGNOSIS — N2581 Secondary hyperparathyroidism of renal origin: Secondary | ICD-10-CM | POA: Diagnosis not present

## 2017-07-23 DIAGNOSIS — N2581 Secondary hyperparathyroidism of renal origin: Secondary | ICD-10-CM | POA: Diagnosis not present

## 2017-07-23 DIAGNOSIS — N186 End stage renal disease: Secondary | ICD-10-CM | POA: Diagnosis not present

## 2017-07-23 DIAGNOSIS — Z4932 Encounter for adequacy testing for peritoneal dialysis: Secondary | ICD-10-CM | POA: Diagnosis not present

## 2017-07-23 DIAGNOSIS — K769 Liver disease, unspecified: Secondary | ICD-10-CM | POA: Diagnosis not present

## 2017-07-23 DIAGNOSIS — D509 Iron deficiency anemia, unspecified: Secondary | ICD-10-CM | POA: Diagnosis not present

## 2017-07-23 DIAGNOSIS — D631 Anemia in chronic kidney disease: Secondary | ICD-10-CM | POA: Diagnosis not present

## 2017-07-24 DIAGNOSIS — D631 Anemia in chronic kidney disease: Secondary | ICD-10-CM | POA: Diagnosis not present

## 2017-07-24 DIAGNOSIS — K769 Liver disease, unspecified: Secondary | ICD-10-CM | POA: Diagnosis not present

## 2017-07-24 DIAGNOSIS — N2581 Secondary hyperparathyroidism of renal origin: Secondary | ICD-10-CM | POA: Diagnosis not present

## 2017-07-24 DIAGNOSIS — D509 Iron deficiency anemia, unspecified: Secondary | ICD-10-CM | POA: Diagnosis not present

## 2017-07-24 DIAGNOSIS — N186 End stage renal disease: Secondary | ICD-10-CM | POA: Diagnosis not present

## 2017-07-24 DIAGNOSIS — Z4932 Encounter for adequacy testing for peritoneal dialysis: Secondary | ICD-10-CM | POA: Diagnosis not present

## 2017-07-25 DIAGNOSIS — Z4932 Encounter for adequacy testing for peritoneal dialysis: Secondary | ICD-10-CM | POA: Diagnosis not present

## 2017-07-25 DIAGNOSIS — D509 Iron deficiency anemia, unspecified: Secondary | ICD-10-CM | POA: Diagnosis not present

## 2017-07-25 DIAGNOSIS — N2581 Secondary hyperparathyroidism of renal origin: Secondary | ICD-10-CM | POA: Diagnosis not present

## 2017-07-25 DIAGNOSIS — D631 Anemia in chronic kidney disease: Secondary | ICD-10-CM | POA: Diagnosis not present

## 2017-07-25 DIAGNOSIS — K769 Liver disease, unspecified: Secondary | ICD-10-CM | POA: Diagnosis not present

## 2017-07-25 DIAGNOSIS — N186 End stage renal disease: Secondary | ICD-10-CM | POA: Diagnosis not present

## 2017-07-26 DIAGNOSIS — N186 End stage renal disease: Secondary | ICD-10-CM | POA: Diagnosis not present

## 2017-07-26 DIAGNOSIS — K769 Liver disease, unspecified: Secondary | ICD-10-CM | POA: Diagnosis not present

## 2017-07-26 DIAGNOSIS — D631 Anemia in chronic kidney disease: Secondary | ICD-10-CM | POA: Diagnosis not present

## 2017-07-26 DIAGNOSIS — Z4932 Encounter for adequacy testing for peritoneal dialysis: Secondary | ICD-10-CM | POA: Diagnosis not present

## 2017-07-26 DIAGNOSIS — N2581 Secondary hyperparathyroidism of renal origin: Secondary | ICD-10-CM | POA: Diagnosis not present

## 2017-07-26 DIAGNOSIS — D509 Iron deficiency anemia, unspecified: Secondary | ICD-10-CM | POA: Diagnosis not present

## 2017-07-27 DIAGNOSIS — D631 Anemia in chronic kidney disease: Secondary | ICD-10-CM | POA: Diagnosis not present

## 2017-07-27 DIAGNOSIS — K769 Liver disease, unspecified: Secondary | ICD-10-CM | POA: Diagnosis not present

## 2017-07-27 DIAGNOSIS — D509 Iron deficiency anemia, unspecified: Secondary | ICD-10-CM | POA: Diagnosis not present

## 2017-07-27 DIAGNOSIS — N2581 Secondary hyperparathyroidism of renal origin: Secondary | ICD-10-CM | POA: Diagnosis not present

## 2017-07-27 DIAGNOSIS — Z4932 Encounter for adequacy testing for peritoneal dialysis: Secondary | ICD-10-CM | POA: Diagnosis not present

## 2017-07-27 DIAGNOSIS — N186 End stage renal disease: Secondary | ICD-10-CM | POA: Diagnosis not present

## 2017-07-28 DIAGNOSIS — K769 Liver disease, unspecified: Secondary | ICD-10-CM | POA: Diagnosis not present

## 2017-07-28 DIAGNOSIS — N2581 Secondary hyperparathyroidism of renal origin: Secondary | ICD-10-CM | POA: Diagnosis not present

## 2017-07-28 DIAGNOSIS — D509 Iron deficiency anemia, unspecified: Secondary | ICD-10-CM | POA: Diagnosis not present

## 2017-07-28 DIAGNOSIS — N186 End stage renal disease: Secondary | ICD-10-CM | POA: Diagnosis not present

## 2017-07-28 DIAGNOSIS — Z4932 Encounter for adequacy testing for peritoneal dialysis: Secondary | ICD-10-CM | POA: Diagnosis not present

## 2017-07-28 DIAGNOSIS — D631 Anemia in chronic kidney disease: Secondary | ICD-10-CM | POA: Diagnosis not present

## 2017-07-29 DIAGNOSIS — D509 Iron deficiency anemia, unspecified: Secondary | ICD-10-CM | POA: Diagnosis not present

## 2017-07-29 DIAGNOSIS — Z4932 Encounter for adequacy testing for peritoneal dialysis: Secondary | ICD-10-CM | POA: Diagnosis not present

## 2017-07-29 DIAGNOSIS — N2581 Secondary hyperparathyroidism of renal origin: Secondary | ICD-10-CM | POA: Diagnosis not present

## 2017-07-29 DIAGNOSIS — K769 Liver disease, unspecified: Secondary | ICD-10-CM | POA: Diagnosis not present

## 2017-07-29 DIAGNOSIS — N186 End stage renal disease: Secondary | ICD-10-CM | POA: Diagnosis not present

## 2017-07-29 DIAGNOSIS — D631 Anemia in chronic kidney disease: Secondary | ICD-10-CM | POA: Diagnosis not present

## 2017-07-30 DIAGNOSIS — K769 Liver disease, unspecified: Secondary | ICD-10-CM | POA: Diagnosis not present

## 2017-07-30 DIAGNOSIS — N186 End stage renal disease: Secondary | ICD-10-CM | POA: Diagnosis not present

## 2017-07-30 DIAGNOSIS — D631 Anemia in chronic kidney disease: Secondary | ICD-10-CM | POA: Diagnosis not present

## 2017-07-30 DIAGNOSIS — Z4932 Encounter for adequacy testing for peritoneal dialysis: Secondary | ICD-10-CM | POA: Diagnosis not present

## 2017-07-30 DIAGNOSIS — D509 Iron deficiency anemia, unspecified: Secondary | ICD-10-CM | POA: Diagnosis not present

## 2017-07-30 DIAGNOSIS — N2581 Secondary hyperparathyroidism of renal origin: Secondary | ICD-10-CM | POA: Diagnosis not present

## 2017-07-31 DIAGNOSIS — D631 Anemia in chronic kidney disease: Secondary | ICD-10-CM | POA: Diagnosis not present

## 2017-07-31 DIAGNOSIS — N186 End stage renal disease: Secondary | ICD-10-CM | POA: Diagnosis not present

## 2017-07-31 DIAGNOSIS — K769 Liver disease, unspecified: Secondary | ICD-10-CM | POA: Diagnosis not present

## 2017-07-31 DIAGNOSIS — N2581 Secondary hyperparathyroidism of renal origin: Secondary | ICD-10-CM | POA: Diagnosis not present

## 2017-07-31 DIAGNOSIS — D509 Iron deficiency anemia, unspecified: Secondary | ICD-10-CM | POA: Diagnosis not present

## 2017-07-31 DIAGNOSIS — Z4932 Encounter for adequacy testing for peritoneal dialysis: Secondary | ICD-10-CM | POA: Diagnosis not present

## 2017-08-01 DIAGNOSIS — D631 Anemia in chronic kidney disease: Secondary | ICD-10-CM | POA: Diagnosis not present

## 2017-08-01 DIAGNOSIS — D509 Iron deficiency anemia, unspecified: Secondary | ICD-10-CM | POA: Diagnosis not present

## 2017-08-01 DIAGNOSIS — Z4932 Encounter for adequacy testing for peritoneal dialysis: Secondary | ICD-10-CM | POA: Diagnosis not present

## 2017-08-01 DIAGNOSIS — N2581 Secondary hyperparathyroidism of renal origin: Secondary | ICD-10-CM | POA: Diagnosis not present

## 2017-08-01 DIAGNOSIS — N186 End stage renal disease: Secondary | ICD-10-CM | POA: Diagnosis not present

## 2017-08-01 DIAGNOSIS — K769 Liver disease, unspecified: Secondary | ICD-10-CM | POA: Diagnosis not present

## 2017-08-02 ENCOUNTER — Emergency Department (HOSPITAL_BASED_OUTPATIENT_CLINIC_OR_DEPARTMENT_OTHER): Payer: Medicare Other

## 2017-08-02 ENCOUNTER — Encounter (HOSPITAL_BASED_OUTPATIENT_CLINIC_OR_DEPARTMENT_OTHER): Payer: Self-pay | Admitting: *Deleted

## 2017-08-02 ENCOUNTER — Other Ambulatory Visit: Payer: Self-pay

## 2017-08-02 ENCOUNTER — Inpatient Hospital Stay (HOSPITAL_BASED_OUTPATIENT_CLINIC_OR_DEPARTMENT_OTHER)
Admission: EM | Admit: 2017-08-02 | Discharge: 2017-08-08 | DRG: 871 | Disposition: A | Discharge status: Home / Self Care | Payer: Medicare Other | Attending: Family Medicine | Admitting: Family Medicine

## 2017-08-02 DIAGNOSIS — N184 Chronic kidney disease, stage 4 (severe): Secondary | ICD-10-CM

## 2017-08-02 DIAGNOSIS — L409 Psoriasis, unspecified: Secondary | ICD-10-CM | POA: Diagnosis present

## 2017-08-02 DIAGNOSIS — K652 Spontaneous bacterial peritonitis: Secondary | ICD-10-CM | POA: Diagnosis present

## 2017-08-02 DIAGNOSIS — G40909 Epilepsy, unspecified, not intractable, without status epilepticus: Secondary | ICD-10-CM | POA: Diagnosis present

## 2017-08-02 DIAGNOSIS — D631 Anemia in chronic kidney disease: Secondary | ICD-10-CM | POA: Diagnosis present

## 2017-08-02 DIAGNOSIS — Z880 Allergy status to penicillin: Secondary | ICD-10-CM | POA: Diagnosis not present

## 2017-08-02 DIAGNOSIS — E0865 Diabetes mellitus due to underlying condition with hyperglycemia: Secondary | ICD-10-CM | POA: Diagnosis not present

## 2017-08-02 DIAGNOSIS — A419 Sepsis, unspecified organism: Secondary | ICD-10-CM

## 2017-08-02 DIAGNOSIS — I132 Hypertensive heart and chronic kidney disease with heart failure and with stage 5 chronic kidney disease, or end stage renal disease: Secondary | ICD-10-CM | POA: Diagnosis present

## 2017-08-02 DIAGNOSIS — R197 Diarrhea, unspecified: Secondary | ICD-10-CM | POA: Diagnosis present

## 2017-08-02 DIAGNOSIS — Z992 Dependence on renal dialysis: Secondary | ICD-10-CM

## 2017-08-02 DIAGNOSIS — Z8249 Family history of ischemic heart disease and other diseases of the circulatory system: Secondary | ICD-10-CM

## 2017-08-02 DIAGNOSIS — E876 Hypokalemia: Secondary | ICD-10-CM | POA: Diagnosis present

## 2017-08-02 DIAGNOSIS — L03319 Cellulitis of trunk, unspecified: Secondary | ICD-10-CM | POA: Diagnosis present

## 2017-08-02 DIAGNOSIS — Z961 Presence of intraocular lens: Secondary | ICD-10-CM | POA: Diagnosis present

## 2017-08-02 DIAGNOSIS — E1022 Type 1 diabetes mellitus with diabetic chronic kidney disease: Secondary | ICD-10-CM | POA: Diagnosis present

## 2017-08-02 DIAGNOSIS — Z9842 Cataract extraction status, left eye: Secondary | ICD-10-CM | POA: Diagnosis not present

## 2017-08-02 DIAGNOSIS — K769 Liver disease, unspecified: Secondary | ICD-10-CM | POA: Diagnosis not present

## 2017-08-02 DIAGNOSIS — E1129 Type 2 diabetes mellitus with other diabetic kidney complication: Secondary | ICD-10-CM | POA: Diagnosis not present

## 2017-08-02 DIAGNOSIS — A408 Other streptococcal sepsis: Secondary | ICD-10-CM | POA: Diagnosis not present

## 2017-08-02 DIAGNOSIS — I10 Essential (primary) hypertension: Secondary | ICD-10-CM

## 2017-08-02 DIAGNOSIS — K650 Generalized (acute) peritonitis: Secondary | ICD-10-CM | POA: Diagnosis not present

## 2017-08-02 DIAGNOSIS — L02219 Cutaneous abscess of trunk, unspecified: Secondary | ICD-10-CM | POA: Diagnosis present

## 2017-08-02 DIAGNOSIS — E440 Moderate protein-calorie malnutrition: Secondary | ICD-10-CM | POA: Diagnosis not present

## 2017-08-02 DIAGNOSIS — D509 Iron deficiency anemia, unspecified: Secondary | ICD-10-CM | POA: Diagnosis not present

## 2017-08-02 DIAGNOSIS — Z7952 Long term (current) use of systemic steroids: Secondary | ICD-10-CM

## 2017-08-02 DIAGNOSIS — N186 End stage renal disease: Secondary | ICD-10-CM | POA: Diagnosis not present

## 2017-08-02 DIAGNOSIS — Z794 Long term (current) use of insulin: Secondary | ICD-10-CM | POA: Diagnosis not present

## 2017-08-02 DIAGNOSIS — L03311 Cellulitis of abdominal wall: Secondary | ICD-10-CM | POA: Diagnosis not present

## 2017-08-02 DIAGNOSIS — Z9841 Cataract extraction status, right eye: Secondary | ICD-10-CM | POA: Diagnosis not present

## 2017-08-02 DIAGNOSIS — R109 Unspecified abdominal pain: Secondary | ICD-10-CM | POA: Diagnosis not present

## 2017-08-02 DIAGNOSIS — Z8701 Personal history of pneumonia (recurrent): Secondary | ICD-10-CM | POA: Diagnosis not present

## 2017-08-02 DIAGNOSIS — E0822 Diabetes mellitus due to underlying condition with diabetic chronic kidney disease: Secondary | ICD-10-CM | POA: Diagnosis not present

## 2017-08-02 DIAGNOSIS — R509 Fever, unspecified: Secondary | ICD-10-CM

## 2017-08-02 DIAGNOSIS — E10319 Type 1 diabetes mellitus with unspecified diabetic retinopathy without macular edema: Secondary | ICD-10-CM | POA: Diagnosis present

## 2017-08-02 DIAGNOSIS — R0902 Hypoxemia: Secondary | ICD-10-CM | POA: Diagnosis present

## 2017-08-02 DIAGNOSIS — D630 Anemia in neoplastic disease: Secondary | ICD-10-CM | POA: Diagnosis not present

## 2017-08-02 DIAGNOSIS — I5032 Chronic diastolic (congestive) heart failure: Secondary | ICD-10-CM | POA: Diagnosis present

## 2017-08-02 DIAGNOSIS — K658 Other peritonitis: Secondary | ICD-10-CM | POA: Diagnosis not present

## 2017-08-02 DIAGNOSIS — N2581 Secondary hyperparathyroidism of renal origin: Secondary | ICD-10-CM | POA: Diagnosis present

## 2017-08-02 DIAGNOSIS — Z79899 Other long term (current) drug therapy: Secondary | ICD-10-CM | POA: Diagnosis not present

## 2017-08-02 DIAGNOSIS — R17 Unspecified jaundice: Secondary | ICD-10-CM | POA: Diagnosis not present

## 2017-08-02 DIAGNOSIS — R590 Localized enlarged lymph nodes: Secondary | ICD-10-CM | POA: Diagnosis present

## 2017-08-02 DIAGNOSIS — J984 Other disorders of lung: Secondary | ICD-10-CM | POA: Diagnosis present

## 2017-08-02 DIAGNOSIS — R1084 Generalized abdominal pain: Secondary | ICD-10-CM | POA: Diagnosis not present

## 2017-08-02 DIAGNOSIS — IMO0002 Reserved for concepts with insufficient information to code with codable children: Secondary | ICD-10-CM

## 2017-08-02 DIAGNOSIS — Z4932 Encounter for adequacy testing for peritoneal dialysis: Secondary | ICD-10-CM | POA: Diagnosis not present

## 2017-08-02 DIAGNOSIS — J90 Pleural effusion, not elsewhere classified: Secondary | ICD-10-CM | POA: Diagnosis not present

## 2017-08-02 DIAGNOSIS — R110 Nausea: Secondary | ICD-10-CM | POA: Diagnosis not present

## 2017-08-02 DIAGNOSIS — D649 Anemia, unspecified: Secondary | ICD-10-CM | POA: Diagnosis present

## 2017-08-02 LAB — COMPREHENSIVE METABOLIC PANEL
ALBUMIN: 1.7 g/dL — AB (ref 3.5–5.0)
ALK PHOS: 148 U/L — AB (ref 38–126)
ALT: 32 U/L (ref 17–63)
AST: 24 U/L (ref 15–41)
Anion gap: 10 (ref 5–15)
BILIRUBIN TOTAL: 0.3 mg/dL (ref 0.3–1.2)
BUN: 34 mg/dL — AB (ref 6–20)
CALCIUM: 7.2 mg/dL — AB (ref 8.9–10.3)
CO2: 26 mmol/L (ref 22–32)
Chloride: 95 mmol/L — ABNORMAL LOW (ref 101–111)
Creatinine, Ser: 12.41 mg/dL — ABNORMAL HIGH (ref 0.61–1.24)
GFR calc Af Amer: 5 mL/min — ABNORMAL LOW (ref >60)
GFR calc non Af Amer: 4 mL/min — ABNORMAL LOW (ref >60)
GLUCOSE: 186 mg/dL — AB (ref 65–99)
Potassium: 3 mmol/L — ABNORMAL LOW (ref 3.5–5.1)
SODIUM: 131 mmol/L — AB (ref 135–145)
Total Protein: 6.8 g/dL (ref 6.5–8.1)

## 2017-08-02 LAB — CBC WITH DIFFERENTIAL/PLATELET
BASOS PCT: 0 %
Basophils Absolute: 0 10*3/uL (ref 0.0–0.1)
EOS ABS: 0.2 10*3/uL (ref 0.0–0.7)
Eosinophils Relative: 1 %
HEMATOCRIT: 29.5 % — AB (ref 39.0–52.0)
HEMOGLOBIN: 9.6 g/dL — AB (ref 13.0–17.0)
LYMPHS PCT: 6 %
Lymphs Abs: 1.1 10*3/uL (ref 0.7–4.0)
MCH: 28.6 pg (ref 26.0–34.0)
MCHC: 32.5 g/dL (ref 30.0–36.0)
MCV: 87.8 fL (ref 78.0–100.0)
MONOS PCT: 7 %
Monocytes Absolute: 1.3 10*3/uL — ABNORMAL HIGH (ref 0.1–1.0)
NEUTROS PCT: 86 %
Neutro Abs: 15.3 10*3/uL — ABNORMAL HIGH (ref 1.7–7.7)
Platelets: 222 10*3/uL (ref 150–400)
RBC: 3.36 MIL/uL — ABNORMAL LOW (ref 4.22–5.81)
RDW: 15.6 % — ABNORMAL HIGH (ref 11.5–15.5)
WBC: 17.9 10*3/uL — ABNORMAL HIGH (ref 4.0–10.5)

## 2017-08-02 LAB — LIPASE, BLOOD: Lipase: 17 U/L (ref 11–51)

## 2017-08-02 LAB — I-STAT CG4 LACTIC ACID, ED: Lactic Acid, Venous: 1.1 mmol/L (ref 0.5–1.9)

## 2017-08-02 MED ORDER — BISACODYL 5 MG PO TBEC
5.0000 mg | DELAYED_RELEASE_TABLET | Freq: Every day | ORAL | Status: DC | PRN
  Administered 2017-08-06: 5 mg via ORAL
  Filled 2017-08-02: qty 1

## 2017-08-02 MED ORDER — LEVOFLOXACIN IN D5W 500 MG/100ML IV SOLN
500.0000 mg | INTRAVENOUS | Status: DC
  Administered 2017-08-04: 500 mg via INTRAVENOUS
  Filled 2017-08-02: qty 100

## 2017-08-02 MED ORDER — POTASSIUM CHLORIDE CRYS ER 20 MEQ PO TBCR
20.0000 meq | EXTENDED_RELEASE_TABLET | Freq: Once | ORAL | Status: AC
  Administered 2017-08-03: 20 meq via ORAL
  Filled 2017-08-02: qty 1

## 2017-08-02 MED ORDER — SUCROFERRIC OXYHYDROXIDE 500 MG PO CHEW
500.0000 mg | CHEWABLE_TABLET | Freq: Three times a day (TID) | ORAL | Status: DC
  Administered 2017-08-03 – 2017-08-08 (×12): 500 mg via ORAL
  Filled 2017-08-02 (×18): qty 1

## 2017-08-02 MED ORDER — ACETAMINOPHEN 325 MG PO TABS: 650.0000 mg | ORAL_TABLET | Freq: Four times a day (QID) | ORAL | Status: DC | PRN

## 2017-08-02 MED ORDER — SENNOSIDES-DOCUSATE SODIUM 8.6-50 MG PO TABS
1.0000 | ORAL_TABLET | Freq: Every evening | ORAL | Status: DC | PRN
  Administered 2017-08-06: 1 via ORAL
  Filled 2017-08-02: qty 1

## 2017-08-02 MED ORDER — ONDANSETRON HCL 4 MG/2ML IJ SOLN: 4.0000 mg | Freq: Four times a day (QID) | INTRAMUSCULAR | Status: DC | PRN

## 2017-08-02 MED ORDER — LEVOFLOXACIN IN D5W 750 MG/150ML IV SOLN: 750.0000 mg | Freq: Once | INTRAVENOUS | Status: DC

## 2017-08-02 MED ORDER — ONDANSETRON HCL 4 MG PO TABS: 4.0000 mg | ORAL_TABLET | Freq: Four times a day (QID) | ORAL | Status: DC | PRN

## 2017-08-02 MED ORDER — VANCOMYCIN HCL 10 G IV SOLR
2000.0000 mg | Freq: Once | INTRAVENOUS | Status: AC
  Administered 2017-08-03: 2000 mg via INTRAVENOUS
  Filled 2017-08-02: qty 2000

## 2017-08-02 MED ORDER — METRONIDAZOLE IN NACL 5-0.79 MG/ML-% IV SOLN
500.0000 mg | Freq: Once | INTRAVENOUS | Status: DC
Start: 2017-08-02 — End: 2017-08-02

## 2017-08-02 MED ORDER — TRIAMCINOLONE ACETONIDE 0.1 % EX OINT
1.0000 | TOPICAL_OINTMENT | CUTANEOUS | Status: DC | PRN
Start: 2017-08-02 — End: 2017-08-08

## 2017-08-02 MED ORDER — MAGNESIUM SULFATE IN D5W 1-5 GM/100ML-% IV SOLN
1.0000 g | Freq: Once | INTRAVENOUS | Status: AC
  Administered 2017-08-03: 1 g via INTRAVENOUS
  Filled 2017-08-02: qty 100

## 2017-08-02 MED ORDER — LEVOFLOXACIN IN D5W 750 MG/150ML IV SOLN
750.0000 mg | Freq: Once | INTRAVENOUS | Status: AC
  Administered 2017-08-02: 750 mg via INTRAVENOUS
  Filled 2017-08-02: qty 150

## 2017-08-02 MED ORDER — SODIUM CHLORIDE 0.9 % IV SOLN: 250.0000 mL | INTRAVENOUS | Status: DC | PRN

## 2017-08-02 MED ORDER — HYDROCODONE-ACETAMINOPHEN 5-325 MG PO TABS
1.0000 | ORAL_TABLET | ORAL | Status: DC | PRN
  Administered 2017-08-04: 2 via ORAL
  Administered 2017-08-04: 1 via ORAL
  Administered 2017-08-05 (×2): 2 via ORAL
  Administered 2017-08-06: 1 via ORAL
  Filled 2017-08-02 (×3): qty 2
  Filled 2017-08-02: qty 1
  Filled 2017-08-02 (×3): qty 2
  Filled 2017-08-02: qty 1

## 2017-08-02 MED ORDER — SODIUM CHLORIDE 0.9% FLUSH
3.0000 mL | Freq: Two times a day (BID) | INTRAVENOUS | Status: DC
  Administered 2017-08-03 – 2017-08-07 (×9): 3 mL via INTRAVENOUS

## 2017-08-02 MED ORDER — METRONIDAZOLE IN NACL 5-0.79 MG/ML-% IV SOLN
500.0000 mg | Freq: Three times a day (TID) | INTRAVENOUS | Status: DC
Start: 2017-08-03 — End: 2017-08-05
  Administered 2017-08-03 – 2017-08-05 (×7): 500 mg via INTRAVENOUS
  Filled 2017-08-02 (×7): qty 100

## 2017-08-02 MED ORDER — INSULIN ASPART 100 UNIT/ML ~~LOC~~ SOLN
0.0000 [IU] | Freq: Every day | SUBCUTANEOUS | Status: DC
  Administered 2017-08-06: 4 [IU] via SUBCUTANEOUS

## 2017-08-02 MED ORDER — ACETAMINOPHEN 325 MG PO TABS
650.0000 mg | ORAL_TABLET | Freq: Once | ORAL | Status: AC
  Administered 2017-08-02: 650 mg via ORAL
  Filled 2017-08-02: qty 2

## 2017-08-02 MED ORDER — INSULIN ASPART 100 UNIT/ML ~~LOC~~ SOLN
0.0000 [IU] | Freq: Three times a day (TID) | SUBCUTANEOUS | Status: DC
  Administered 2017-08-03 – 2017-08-04 (×3): 2 [IU] via SUBCUTANEOUS
  Administered 2017-08-04: 1 [IU] via SUBCUTANEOUS
  Administered 2017-08-05: 5 [IU] via SUBCUTANEOUS
  Administered 2017-08-05: 7 [IU] via SUBCUTANEOUS
  Administered 2017-08-06: 2 [IU] via SUBCUTANEOUS
  Administered 2017-08-06 (×2): 3 [IU] via SUBCUTANEOUS
  Administered 2017-08-07: 1 [IU] via SUBCUTANEOUS
  Administered 2017-08-07 (×2): 3 [IU] via SUBCUTANEOUS
  Administered 2017-08-08: 9 [IU] via SUBCUTANEOUS
  Administered 2017-08-08 (×2): 7 [IU] via SUBCUTANEOUS

## 2017-08-02 MED ORDER — CALCITRIOL 0.5 MCG PO CAPS
0.5000 ug | ORAL_CAPSULE | Freq: Every day | ORAL | Status: DC
  Administered 2017-08-03 – 2017-08-08 (×6): 0.5 ug via ORAL
  Filled 2017-08-02 (×6): qty 1

## 2017-08-02 MED ORDER — METRONIDAZOLE IN NACL 5-0.79 MG/ML-% IV SOLN
500.0000 mg | Freq: Once | INTRAVENOUS | Status: AC
  Administered 2017-08-02: 500 mg via INTRAVENOUS
  Filled 2017-08-02: qty 100

## 2017-08-02 MED ORDER — ACETAMINOPHEN 650 MG RE SUPP: 650.0000 mg | Freq: Four times a day (QID) | RECTAL | Status: DC | PRN

## 2017-08-02 MED ORDER — CARVEDILOL 6.25 MG PO TABS
6.2500 mg | ORAL_TABLET | Freq: Two times a day (BID) | ORAL | Status: DC
  Administered 2017-08-03 – 2017-08-08 (×12): 6.25 mg via ORAL
  Filled 2017-08-02 (×12): qty 1

## 2017-08-02 MED ORDER — SODIUM CHLORIDE 0.9% FLUSH: 3.0000 mL | INTRAVENOUS | Status: DC | PRN

## 2017-08-02 MED ORDER — AMLODIPINE BESYLATE 5 MG PO TABS
5.0000 mg | ORAL_TABLET | Freq: Every day | ORAL | Status: DC
  Administered 2017-08-03 – 2017-08-08 (×6): 5 mg via ORAL
  Filled 2017-08-02 (×6): qty 1

## 2017-08-02 NOTE — ED Provider Notes (Signed)
[place] EMERGENCY DEPARTMENT Provider Note   CSN: [id number] Arrival date & time: 08/02/17  1304     History   Chief Complaint Chief Complaint  Patient presents with  . Abdominal Pain    HPI Bryan Wilkerson is a 52 y.o. male.  The history is provided by the patient and the spouse. No language interpreter was used.  Abdominal Pain   This is a new problem. The current episode started more than 2 days ago. The problem occurs constantly. The problem has been gradually worsening. The pain is associated with an unknown factor. The pain is severe. Associated symptoms include fever, nausea and myalgias. Pertinent negatives include anorexia, diarrhea, vomiting, constipation and dysuria. The symptoms are aggravated by palpation. Nothing relieves the symptoms.    Past Medical History:  Diagnosis Date  . Anemia   . CHF (congestive heart failure) ([place])   . Diabetic retinopathy ([place])   . ESRD on peritoneal dialysis ([place])    "7 days/week" (04/20/2017)  . Hypertension   . Pneumonia 2016; 04/19/2017  . Psoriasis   . Type I diabetes mellitus [facility])     Patient Active Problem List   Diagnosis Date Noted  . Generalized abdominal pain 04/27/2017  . Loculated pleural effusion 04/27/2017  . Pneumonia 04/19/2017  . Diabetes mellitus due to underlying condition, uncontrolled, with stage 4 chronic kidney disease, with long-term current use of insulin ([place]) 01/13/2017  . Seizure ([place]) 11/23/2016  . Hypocalcemia 11/23/2016  . ESRD on dialysis ([place]) 11/16/2013  . Pre-operative cardiovascular examination 11/12/2013  . Dyspnea 10/31/2013  . CHF (congestive heart failure), NYHA class II ([place]) 12/29/2012  . Essential hypertension, benign 12/29/2012  . Hyperkalemia 12/29/2012  . Anemia 12/29/2012    Past Surgical History:  Procedure Laterality Date  . AV FISTULA PLACEMENT Right 12/05/2013   Procedure: RADIOCEPHALIC VS. BRACHIOCEPHALIC ARTERIOVENOUS (AV) FISTULA CREATION;  Surgeon:  Conrad [place], MD;  Location: [place];  Service: Vascular;  Laterality: Right;  . AV FISTULA PLACEMENT Left 07/20/2016   Procedure: LEFT ARM RADIOCEPHALIC ARTERIOVENOUS (AV) FISTULA CREATION;  Surgeon: Waynetta Sandy, MD;  Location: [place];  Service: Vascular;  Laterality: Left;  . CATARACT EXTRACTION W/ INTRAOCULAR LENS  IMPLANT, BILATERAL Bilateral   . EYE SURGERY    . FISTULOGRAM Right 07/16/2014   Procedure: FISTULOGRAM;  Surgeon: Conrad [place], MD;  Location: [place];  Service: Vascular;  Laterality: Right;  . IR THORACENTESIS ASP PLEURAL SPACE W/IMG GUIDE  05/02/2017  . LIGATION OF COMPETING BRANCHES OF ARTERIOVENOUS FISTULA Right 07/16/2014   Procedure: LIGATION OF COMPETING BRANCHES OF ARTERIOVENOUS FISTULA;  Surgeon: Conrad Granger, MD;  Location: [place];  Service: Vascular;  Laterality: Right;  . PERITONEAL CATHETER INSERTION Left ~ 08/2016  . PORT-A-CATH REMOVAL  2017  . PORTA CATH INSERTION Right    "for hemodialysis"  . RETINAL DETACHMENT SURGERY Right        Home Medications    Prior to Admission medications   Medication Sig Start Date End Date Taking? Authorizing Provider  amLODipine (NORVASC) 5 MG tablet Take 5 mg by mouth daily. 03/24/17  Yes [provider]  calcitRIOL (ROCALTROL) 0.25 MCG capsule Take 2 capsules (0.5 mcg total) by mouth daily. 04/22/17  Yes Hongalgi, Lenis Dickinson, MD  carvedilol (COREG) 6.25 MG tablet Take 1 tablet (6.25 mg total) by mouth 2 (two) times daily with a meal. 11/27/16  Yes Hosie Poisson, MD  insulin NPH-regular Human (NOVOLIN 70/30) (70-30) 100 UNIT/ML injection Inject 4 Units  into the skin daily.    Yes [provider]  sucroferric oxyhydroxide (VELPHORO) 500 MG chewable tablet Chew 500 mg by mouth 3 (three) times daily with meals.   Yes [provider]  triamcinolone ointment (KENALOG) 0.1 % Apply 1 application topically as needed (psorasis).  10/10/13  Yes [provider]  B Complex-C-Folic Acid (NEPHRO-VITE PO)  Take 1 tablet by mouth every morning.    [provider]    Family History Family History  Problem Relation Age of Onset  . Hypertension Mother   . Heart attack Mother   . Chronic Renal Failure Neg Hx   . Diabetes Neg Hx   . Stroke Neg Hx   . Cancer Neg Hx     Social History Social History   Tobacco Use  . Smoking status: Never Smoker  . Smokeless tobacco: Never Used  Substance Use Topics  . Alcohol use: No    Alcohol/week: 0.0 oz  . Drug use: No     Allergies   Penicillins   Review of Systems Review of Systems  Constitutional: Positive for chills, fatigue and fever. Negative for diaphoresis.  HENT: Negative for congestion.   Respiratory: Negative for cough, chest tightness, shortness of breath, wheezing and stridor.   Cardiovascular: Negative for chest pain and palpitations.  Gastrointestinal: Positive for abdominal pain and nausea. Negative for anorexia, constipation, diarrhea and vomiting.  Genitourinary: Negative for dysuria and flank pain.  Musculoskeletal: Positive for myalgias. Negative for back pain.  Skin: Negative for rash and wound.  Neurological: Negative for light-headedness.  All other systems reviewed and are negative.    Physical Exam Updated Vital Signs BP (!) 120/97 (BP Location: Left Arm)   Pulse 81   Temp (!) 100.4 F (38 C) (Oral)   Resp 18   Ht 6\' 3"  (1.905 m)   Wt 87.1 kg (192 lb)   SpO2 98%   BMI 24.00 kg/m   Physical Exam  Constitutional: He is oriented to person, place, and time. He appears well-developed and well-nourished. No distress.  HENT:  Head: Normocephalic.  Mouth/Throat: Oropharynx is clear and moist.  Cardiovascular: Normal rate and intact distal pulses.  No murmur heard. Pulmonary/Chest: Effort normal. No tachypnea. No respiratory distress. He has decreased breath sounds in the left lower field. He has no rales. He exhibits no tenderness.  Abdominal: He exhibits no distension. There is generalized  tenderness. There is no rigidity, no rebound, no guarding and no CVA tenderness.    Musculoskeletal: He exhibits tenderness.  Neurological: He is alert and oriented to person, place, and time. No sensory deficit. He exhibits normal muscle tone.  Skin: Capillary refill takes less than 2 seconds. He is not diaphoretic. No erythema. No pallor.  Nursing note and vitals reviewed.    ED Treatments / Results  Labs (all labs ordered are listed, but only abnormal results are displayed) Labs Reviewed  MRSA PCR SCREENING - Abnormal; Notable for the following components:      Result Value   MRSA by PCR POSITIVE (*)    All other components within normal limits  CBC WITH DIFFERENTIAL/PLATELET - Abnormal; Notable for the following components:   WBC 17.9 (*)    RBC 3.36 (*)    Hemoglobin 9.6 (*)    HCT 29.5 (*)    RDW 15.6 (*)    Neutro Abs 15.3 (*)    Monocytes Absolute 1.3 (*)    All other components within normal limits  COMPREHENSIVE METABOLIC PANEL - Abnormal;  Notable for the following components:   Sodium 131 (*)    Potassium 3.0 (*)    Chloride 95 (*)    Glucose, Bld 186 (*)    BUN 34 (*)    Creatinine, Ser 12.41 (*)    Calcium 7.2 (*)    Albumin 1.7 (*)    Alkaline Phosphatase 148 (*)    GFR calc non Af Amer 4 (*)    GFR calc Af Amer 5 (*)    All other components within normal limits  PROTIME-INR - Abnormal; Notable for the following components:   Prothrombin Time 17.6 (*)    All other components within normal limits  APTT - Abnormal; Notable for the following components:   aPTT 43 (*)    All other components within normal limits  BASIC METABOLIC PANEL - Abnormal; Notable for the following components:   Sodium 132 (*)    Potassium 2.8 (*)    Chloride 96 (*)    Glucose, Bld 213 (*)    BUN 32 (*)    Creatinine, Ser 12.13 (*)    Calcium 6.8 (*)    GFR calc non Af Amer 4 (*)    GFR calc Af Amer 5 (*)    All other components within normal limits  MAGNESIUM - Abnormal;  Notable for the following components:   Magnesium 3.4 (*)    All other components within normal limits  CBC WITH DIFFERENTIAL/PLATELET - Abnormal; Notable for the following components:   WBC 16.0 (*)    RBC 3.14 (*)    Hemoglobin 9.1 (*)    HCT 27.9 (*)    RDW 16.2 (*)    Neutro Abs 12.6 (*)    All other components within normal limits  VITAMIN B12 - Abnormal; Notable for the following components:   Vitamin B-12 995 (*)    All other components within normal limits  IRON AND TIBC - Abnormal; Notable for the following components:   Iron 9 (*)    All other components within normal limits  FERRITIN - Abnormal; Notable for the following components:   Ferritin 1,275 (*)    All other components within normal limits  RETICULOCYTES - Abnormal; Notable for the following components:   RBC. 3.23 (*)    All other components within normal limits  GLUCOSE, CAPILLARY - Abnormal; Notable for the following components:   Glucose-Capillary 122 (*)    All other components within normal limits  CULTURE, BLOOD (ROUTINE X 2)  CULTURE, BLOOD (ROUTINE X 2)  BODY FLUID CULTURE  GRAM STAIN  LIPASE, BLOOD  PROCALCITONIN  FOLATE  BODY FLUID CELL COUNT WITH DIFFERENTIAL  I-STAT CG4 LACTIC ACID, ED    EKG  EKG Interpretation  Date/Time:  Tuesday August 02 2017 16:43:56 EST Ventricular Rate:  88 PR Interval:    QRS Duration: 84 QT Interval:  381 QTC Calculation: 461 R Axis:   9 Text Interpretation:  Sinus rhythm Nonspecific T abnormalities, lateral leads Baseline wander in lead(s) V4 When compared to prior, less sharp t waves.  No STEMI Confirmed by Antony Blackbird [id number]) on 08/02/2017 5:05:12 PM       Radiology Ct Abdomen Pelvis Wo Contrast  Addendum Date: 08/02/2017   ADDENDUM REPORT: 08/02/2017 19:40 ADDENDUM: After speaking with the clinician, area concern is the anterior abdominal wall in the lower abdomen. There is a soft tissue subcutaneous nodule anterior to the rectus muscle in the  lower abdominal wall in the suprapubic region measuring up to 2.3 cm. Slight stranding  noted in the subcutaneous soft tissues adjacent to this area. Cannot exclude cellulitis with small developing abscess. Electronically Signed   By: Rolm Baptise M.D.   On: 08/02/2017 19:40   Result Date: 08/02/2017 EXAM: CT ABDOMEN AND PELVIS WITHOUT CONTRAST TECHNIQUE: Multidetector CT imaging of the abdomen and pelvis was performed following the standard protocol without IV contrast. COMPARISON:  None. FINDINGS: Lower chest: Small left pleural effusion with left lower lobe atelectasis. Heart is borderline in size. Right lung base clear. Hepatobiliary: No focal hepatic abnormality. Gallbladder unremarkable. Pancreas: No focal abnormality or ductal dilatation. Spleen: No focal abnormality.  Normal size. Adrenals/Urinary Tract: No adrenal abnormality. No focal renal abnormality. No stones or hydronephrosis. Urinary bladder is unremarkable. Stomach/Bowel: Bowel grossly unremarkable. No evidence of bowel obstruction. Appendix not visualized. Evaluation of the bowel is limited due to limited oral contrast and stranding/haziness throughout the mesentery. Vascular/Lymphatic: Scattered aortic and iliac calcifications. No aneurysm or adenopathy. Reproductive: No visible focal abnormality. Other: Small amount of free fluid in the pelvis. There are locules of free air noted. Peritoneal dialysis catheter noted in the pelvis. Free air is presumably related to recent peritoneal dialysis. Stranding/haziness noted throughout the mesentery, possibly related to 3rd spacing of fluids or fluid overload. Musculoskeletal: No acute bony abnormality. IMPRESSION: Small left pleural effusion with left base atelectasis. Peritoneal dialysis catheter in the pelvis with small amount of free fluid and locules of free air, presumably related to recent dialysis. Appendix not visualized. No definite acute process in the abdomen or pelvis. Electronically Signed:  By: Rolm Baptise M.D. On: 08/02/2017 18:00   Dg Chest 2 View  Result Date: 08/02/2017 CLINICAL DATA:  RLQ abdominal pain and knot in groin. Pt is on daily dialysis and has h/o CHF, PNA, DM. EXAM: CHEST  2 VIEW COMPARISON:  07/14/2017 FINDINGS: Cardiac silhouette is normal in size. No mediastinal or hilar masses. No convincing adenopathy. Right-sided tunneled dual lumen central venous catheter is stable, distal tip in the right atrium. Left pleural effusion and left mid to lower lung zones scarring is stable from the prior exam. Lungs otherwise clear with no evidence to suggest pneumonia or pulmonary edema. No right pleural effusion. No pneumothorax. Skeletal structures are intact. IMPRESSION: No acute cardiopulmonary disease. Stable chronic changes in the left lung and at the left lung base, with a chronic left pleural effusion. Electronically Signed   By: Lajean Manes M.D.   On: 08/02/2017 17:57    Procedures Procedures (including critical care time)  Medications Ordered in ED Medications  levofloxacin (LEVAQUIN) IVPB 500 mg (not administered)  metroNIDAZOLE (FLAGYL) IVPB 500 mg (not administered)  amLODipine (NORVASC) tablet 5 mg (not administered)  calcitRIOL (ROCALTROL) capsule 0.5 mcg (not administered)  carvedilol (COREG) tablet 6.25 mg (not administered)  sucroferric oxyhydroxide (VELPHORO) chewable tablet 500 mg (not administered)  triamcinolone ointment (KENALOG) 0.1 % 1 application (not administered)  sodium chloride flush (NS) 0.9 % injection 3 mL (3 mLs Intravenous Given 08/03/17 0008)  sodium chloride flush (NS) 0.9 % injection 3 mL (not administered)  0.9 %  sodium chloride infusion (not administered)  acetaminophen (TYLENOL) tablet 650 mg (not administered)    Or  acetaminophen (TYLENOL) suppository 650 mg (not administered)  HYDROcodone-acetaminophen (NORCO/VICODIN) 5-325 MG per tablet 1-2 tablet (not administered)  senna-docusate (Senokot-S) tablet 1 tablet (not  administered)  bisacodyl (DULCOLAX) EC tablet 5 mg (not administered)  ondansetron (ZOFRAN) tablet 4 mg (not administered)    Or  ondansetron (ZOFRAN) injection 4 mg (not administered)  insulin aspart (novoLOG) injection 0-9 Units (not administered)  insulin aspart (novoLOG) injection 0-5 Units (0 Units Subcutaneous Not Given 08/02/17 2344)  vancomycin (VANCOCIN) 2,000 mg in sodium chloride 0.9 % 500 mL IVPB (2,000 mg Intravenous New Bag/Given 08/03/17 0148)  levofloxacin (LEVAQUIN) IVPB 750 mg (0 mg Intravenous Stopped 08/02/17 2122)  metroNIDAZOLE (FLAGYL) IVPB 500 mg (0 mg Intravenous Stopped 08/02/17 2144)  acetaminophen (TYLENOL) tablet 650 mg (650 mg Oral Given 08/02/17 1929)  potassium chloride SA (K-DUR,KLOR-CON) CR tablet 20 mEq (20 mEq Oral Given 08/03/17 0007)  magnesium sulfate IVPB 1 g 100 mL (0 g Intravenous Stopped 08/03/17 0103)     Initial Impression / Assessment and Plan / ED Course  I have reviewed the triage vital signs and the nursing notes.  Pertinent labs & imaging results that were available during my care of the patient were reviewed by me and considered in my medical decision making (see chart for details).     Bryan Wilkerson is a 52 y.o. male with a past medical history significant for ESRD on peritoneal dialysis, hypertension, CHF, diabetes, seizures, and prior pneumonia who presents with fevers, chills, nausea, diarrhea, abdominal pain, and abdominal knot.  Patient was directed to come in by Dr. Justin Mend, his nephrologist.  Patient says that for the last week, he has had worsening abdominal pain.  He reports that it acutely worsened today.  He describes it as up to a 9 out of 10 in severity.  It is across his central abdomen.  He also reports that he is developed a small knot in his suprapubic area that also is tender.  He reports he does not make any urine but uses peritoneal dialysis.  He says that his peritoneal dialysis fluid has not appeared any different but he has been  having the abdominal pain with fevers.  He denies any emesis but reports the fevers and chills.  Patient has a temperature of over 100 today.  Patient has not had significant chest pain or shortness of breath and has not had significant cough however family reports that he had a pneumonia several months ago with had to take fluid off his lung.  Patient denies any recent trauma but has had some rhinorrhea and congestion for the last week.  On exam, patient has left lower lobe of the lung has decreased breath sounds compared to the other areas.  No significant rhonchi.  Patient's chest is nontender.  Abdomen is diffusely tender to palpation.  Patient also has some induration palpated in the subcu area of the skin in the central abdomen radiating down towards the groin.  Patient does have a palpated not to that is tender in the supra pubic area.  Patient has no CVA tenderness and no flank tenderness.  Back is nontender.  No significant rashes seen on exam.  Given patient's fever, abdominal pain, peritoneal dialysis use, and directed to come here by the patient's nephrologist, I am concerned that patient may need workup to rule out SBP.  We will however start with laboratory testing, chest x-ray given the asymmetric breath sounds, and imaging of the abdomen to help determine etiology of his abdominal pain.  I suspect we will touch base with the nephrology team and patient may require transfer to another facility where a peritoneal dialysis team can collect the needed samples.  Patient did not want pain medicine on arrival.  Anticipate reassessment after workup.  5:17 PM Patient placed on 2 L nasal cannula after he had persistent hypoxia.  Fever increased to 102.9.  7:16 PM Diagnostic testing returned as seen above.  Lactic acid was not elevated however, patient has a leukocytosis of 17.9.  Metabolic panel showed nonelevated AST and ALT but did show elevated creatinine as expected.  Potassium is 3.0.  Lipase  not elevated.  Next  Chest x-ray shows on changed appearance of the left pleural effusion and left lung scarring.  They did not see evidence of new pneumonia.  CT scan showed no acute intra-abdominal pathology but there was some free fluid and intraperitoneal air likely due to the dialysis.  Radiology was called who reviewed the CT scan and this obtained his area and they thought this is possibly a developing cellulitis or infection on his lower abdomen however it was difficult to fully evaluate without contrast.  The patient's abdominal pain was diffuse and not solely focused on the area of the knot.  Nephrology was called and a discussion was held of possible SBP given the patient's abdominal pain, peritoneal dialysis, leukocytosis, and fever.  They requested patient be given broad-spectrum antibiotics and be admitted to [facility] where he can have dialysis nurses obtain a sample of the dialysate.  They will see the patient on his arrival and request admission to hospital service.    Patient will be admitted.   Final Clinical Impressions(s) / ED Diagnoses   Final diagnoses:  Generalized abdominal pain  Fever, unspecified fever cause    ED Discharge Orders    None      Clinical Impression: 1. Generalized abdominal pain   2. Fever, unspecified fever cause     Disposition: Admit  This note was prepared with assistance of Dragon voice recognition software. Occasional wrong-word or sound-a-like substitutions may have occurred due to the inherent limitations of voice recognition software.     Kael Keetch, Gwenyth Allegra, MD 08/03/17 [id number]

## 2017-08-02 NOTE — Plan of Care (Signed)
Discussed case with Dr. Harrell Gave Tegeler. Bryan Wilkerson is a 52 y/o male with pmh of DM type I, ESRD on PD, CHF, and psoriasis; who presented with complaints of generalized abdominal pain. VS temperature 102.9 F, respiration 15-23, BP is maintained, and O2 saturations as low as 87% requiring patient to be placed on 2 L of nasal cannula oxygen with improvement of O2 saturations.  Labs WBC 17.9, potassium 3, BUN 34, creatinine 12.41, and lactic acid 1.1 . Patient noted to have his suprapubic knot on his abdomen.  CT scan of abdomen pelvis concerning for possible abscess/cellulitis.  Question also the possibility of SBP.  Sepsis protocol was initiated with blood cultures obtained and patient was placed on empiric antibiotics of Flagyl and Levaquin.  Nephrology consulted and want to be notified upon patient's arrival.  Admitted as inpatient to a med-surg bed.

## 2017-08-02 NOTE — ED Triage Notes (Signed)
Pt has RLQ abdominal Pain. He also has a knot on L his groin. Pt is a daily dialysis pt and has Diabetes. His PCP told him to come here. Pt all started last week.

## 2017-08-02 NOTE — Progress Notes (Signed)
Pharmacy Antibiotic Note  Bryan Wilkerson is a 52 y.o. male admitted on 08/02/2017 with cellulitis/abscess and intra-abdominal infection.  Pharmacy has been consulted for vancomycin dosing.  Also on Levaquin and Flagyl.  Plan: Vancomycin 2000mg  IV x1 and monitor levels for further dosing (ESRD on PD).  Goal trough 15-20 mcg/mL.  Height: 6\' 3"  (190.5 cm) Weight: 192 lb (87.1 kg) IBW/kg (Calculated) : 84.5  Temp (24hrs), Avg:101.1 F (38.4 C), Min:99.8 F (37.7 C), Max:102.9 F (39.4 C)  Recent Labs  Lab 08/02/17 1759 08/02/17 1811  WBC 17.9*  --   CREATININE 12.41*  --   LATICACIDVEN  --  1.10    Estimated Creatinine Clearance: 8.5 mL/min (A) (by C-G formula based on SCr of 12.41 mg/dL (H)).    Allergies  Allergen Reactions  . Penicillins Other (See Comments)    UNSPECIFIED REACTION FROM CHILDHOOD Has patient had a PCN reaction causing immediate rash, facial/tongue/throat swelling, SOB or lightheadedness with hypotension:Yes Has patient had a PCN reaction causing severe rash involving mucus membranes or skin necrosis:No Has patient had a PCN reaction that required hospitalization:Yes Has patient had a PCN reaction occurring within the last 10 years:No If all of the above answers are "NO", then may proceed with Cephalosporin use.       Thank you for allowing pharmacy to be a part of this patient's care.  Wynona Neat, PharmD, BCPS  08/02/2017 11:25 PM

## 2017-08-02 NOTE — ED Notes (Signed)
Placed Pt on nasal canula at 2lpm due to spo2 staying below 90. RN Diane informed

## 2017-08-02 NOTE — ED Notes (Signed)
Called [facility])  for consult for hospitalist @ [facility].

## 2017-08-02 NOTE — Progress Notes (Signed)
Pharmacy Antibiotic Note  Bryan Wilkerson is a 52 y.o. male admitted on 08/02/2017 with intra-abdominal infection.  Pharmacy has been consulted for Levaquin and Flagyl dosing (PCN allergy). Pt is febrile. Pt is ESRD on peritoneal dialysis (7 days a week per MD note). Elevated WBC at 17.9. LA WNL.  Plan: Levaquin 750mg  IV x1 Levaquin 500mg  IV q48h Flagyl 500mg  IV q8h F/u renal fxn, clinical resolution  Height: 6\' 3"  (190.5 cm) Weight: 192 lb (87.1 kg) IBW/kg (Calculated) : 84.5  Temp (24hrs), Avg:101.8 F (38.8 C), Min:100.4 F (38 C), Max:102.9 F (39.4 C)  Recent Labs  Lab 08/02/17 1759 08/02/17 1811  WBC 17.9*  --   CREATININE 12.41*  --   LATICACIDVEN  --  1.10    Estimated Creatinine Clearance: 8.5 mL/min (A) (by C-G formula based on SCr of 12.41 mg/dL (H)).    Allergies  Allergen Reactions  . Penicillins Other (See Comments)    UNSPECIFIED REACTION FROM CHILDHOOD Has patient had a PCN reaction causing immediate rash, facial/tongue/throat swelling, SOB or lightheadedness with hypotension:Yes Has patient had a PCN reaction causing severe rash involving mucus membranes or skin necrosis:No Has patient had a PCN reaction that required hospitalization:Yes Has patient had a PCN reaction occurring within the last 10 years:No If all of the above answers are "NO", then may proceed with Cephalosporin use.      Antimicrobials this admission: Levaquin 1/29 >> Flagyl 1/29 >>  Dose adjustments this admission: none  Microbiology results: Pending  Thank you for allowing pharmacy to be a part of this patient's care.  Heysel Lam 08/02/2017 7:21 PM    Tanice Petre D. Mina Marble, PharmD, BCPS Pager:  [phone] 08/02/2017, 7:40 PM

## 2017-08-02 NOTE — ED Notes (Signed)
Report attempted to receiving RN. States will call me back

## 2017-08-02 NOTE — H&P (Signed)
History and Physical    Bryan Wilkerson IRS:[id number] DOB: 1966/05/30 DOA: 08/02/2017  PCP: [name], MD   Patient coming from: Home  Chief Complaint: Abdominal pain, fever   HPI: Bryan Wilkerson is a 52 y.o. male with medical history significant for ESRD on peritoneal dialysis, hypertension, insulin-dependent diabetes mellitus, and psoriasis, now presenting to the emergency department for evaluation of abdominal pain and fever.  Patient reports that he had been in his usual state of health until approximately 1 week ago when he noted the insidious development of generalized abdominal pain.  Pain is described as moderate in severity, generalized, constant, worse with palpation and movement, and improved with a heating pad.  He also reports noting a tender nodule below the umbilicus approximately 1 week ago that feels better with a heating pad.  He denies any chest pain, shortness of breath, or cough.  ED Course: Upon arrival to the ED, patient is found to be febrile to 39.4 C, saturating low 90s on room air, and with vitals otherwise normal.  EKG features a sinus rhythm with nonspecific T wave abnormality in the lateral leads.  Chest x-ray is negative for acute cardiopulmonary disease.  Chemistry panel reveals a sodium of 131, potassium 3.0, and BUN of 34.  CBC is notable for a leukocytosis to 17,900 and hemoglobin of 9.6, down from 12.9 in February 2018.  CT of the abdomen and pelvis is negative for acute intra-abdominal or pelvic pathology, but notable for soft tissue nodule anterior to the rectus muscles in the lower abdominal stranding of the adjacent soft tissues.  Lactic acid is reassuringly normal.  Blood cultures were collected, nephrology was consulted by the ED physician, and the patient was started on empiric Levaquin and Flagyl.  Nephrology recommended medical admission for ongoing evaluation and management of abdominal pain and fever concerning for possible bacterial  peritonitis.  Review of Systems:  All other systems reviewed and apart from HPI, are negative.  Past Medical History:  Diagnosis Date  . Anemia   . CHF (congestive heart failure) ([place])   . Diabetic retinopathy ([place])   . ESRD on peritoneal dialysis ([place])    "7 days/week" (04/20/2017)  . Hypertension   . Pneumonia 2016; 04/19/2017  . Psoriasis   . Type I diabetes mellitus ([place])     Past Surgical History:  Procedure Laterality Date  . AV FISTULA PLACEMENT Right 12/05/2013   Procedure: RADIOCEPHALIC VS. BRACHIOCEPHALIC ARTERIOVENOUS (AV) FISTULA CREATION;  Surgeon: Conrad Enochville, MD;  Location: [place];  Service: Vascular;  Laterality: Right;  . AV FISTULA PLACEMENT Left 07/20/2016   Procedure: LEFT ARM RADIOCEPHALIC ARTERIOVENOUS (AV) FISTULA CREATION;  Surgeon: Waynetta Sandy, MD;  Location: [place];  Service: Vascular;  Laterality: Left;  . CATARACT EXTRACTION W/ INTRAOCULAR LENS  IMPLANT, BILATERAL Bilateral   . EYE SURGERY    . FISTULOGRAM Right 07/16/2014   Procedure: FISTULOGRAM;  Surgeon: Conrad Livingston, MD;  Location: [place];  Service: Vascular;  Laterality: Right;  . IR THORACENTESIS ASP PLEURAL SPACE W/IMG GUIDE  05/02/2017  . LIGATION OF COMPETING BRANCHES OF ARTERIOVENOUS FISTULA Right 07/16/2014   Procedure: LIGATION OF COMPETING BRANCHES OF ARTERIOVENOUS FISTULA;  Surgeon: Conrad Merrifield, MD;  Location: [place];  Service: Vascular;  Laterality: Right;  . PERITONEAL CATHETER INSERTION Left ~ 08/2016  . PORT-A-CATH REMOVAL  2017  . PORTA CATH INSERTION Right    "for hemodialysis"  . RETINAL DETACHMENT SURGERY Right      reports that  has never smoked. he has never used smokeless tobacco. He reports that he does not drink alcohol or use drugs.  Allergies  Allergen Reactions  . Penicillins Other (See Comments)    UNSPECIFIED REACTION FROM CHILDHOOD Has patient had a PCN reaction causing immediate rash, facial/tongue/throat swelling, SOB or lightheadedness with  hypotension:Yes Has patient had a PCN reaction causing severe rash involving mucus membranes or skin necrosis:No Has patient had a PCN reaction that required hospitalization:Yes Has patient had a PCN reaction occurring within the last 10 years:No If all of the above answers are "NO", then may proceed with Cephalosporin use.      Family History  Problem Relation Age of Onset  . Hypertension Mother   . Heart attack Mother   . Chronic Renal Failure Neg Hx   . Diabetes Neg Hx   . Stroke Neg Hx   . Cancer Neg Hx      Prior to Admission medications   Medication Sig Start Date End Date Taking? Authorizing Provider  amLODipine (NORVASC) 5 MG tablet Take 5 mg by mouth daily. 03/24/17  Yes [provider]  calcitRIOL (ROCALTROL) 0.25 MCG capsule Take 2 capsules (0.5 mcg total) by mouth daily. 04/22/17  Yes Hongalgi, Lenis Dickinson, MD  carvedilol (COREG) 6.25 MG tablet Take 1 tablet (6.25 mg total) by mouth 2 (two) times daily with a meal. 11/27/16  Yes Hosie Poisson, MD  insulin NPH-regular Human (NOVOLIN 70/30) (70-30) 100 UNIT/ML injection Inject 4 Units into the skin daily.    Yes [provider]  sucroferric oxyhydroxide (VELPHORO) 500 MG chewable tablet Chew 500 mg by mouth 3 (three) times daily with meals.   Yes [provider]  triamcinolone ointment (KENALOG) 0.1 % Apply 1 application topically as needed (psorasis).  10/10/13  Yes [provider]  B Complex-C-Folic Acid (NEPHRO-VITE PO) Take 1 tablet by mouth every morning.    [provider]    Physical Exam: Vitals:   08/02/17 2048 08/02/17 2100 08/02/17 2130 08/02/17 2314  BP:  (!) 166/94 (!) 152/64 (!) 149/70  Pulse:  84 80 76  Resp:  16 15 18   Temp: (!) 100.5 F (38.1 C)   99.8 F (37.7 C)  TempSrc: Oral   Oral  SpO2:  99% 100% 100%  Weight:      Height:          Constitutional: NAD, calm Eyes: PERTLA, lids and conjunctivae normal ENMT: Mucous membranes are moist. Posterior  pharynx clear of any exudate or lesions.   Neck: normal, supple, no masses, no thyromegaly Respiratory: clear to auscultation bilaterally, no wheezing, no crackles. Normal respiratory effort.    Cardiovascular: S1 & S2 heard, regular rate and rhythm. No extremity edema. No significant JVD. Abdomen: soft, mild generalized tenderness without rebound pain or guarding. Bowel sounds active.  Musculoskeletal: no clubbing / cyanosis. No joint deformity upper and lower extremities.    Skin: no significant rashes, lesions, ulcers. Warm, dry, well-perfused. Neurologic: CN 2-12 grossly intact. Sensation intact. Strength 5/5 in all 4 limbs.  Psychiatric: Alert and oriented x 3. Pleasant, cooperative.     Labs on Admission: I have personally reviewed following labs and imaging studies  CBC: Recent Labs  Lab 08/02/17 1759  WBC 17.9*  NEUTROABS 15.3*  HGB 9.6*  HCT 29.5*  MCV 87.8  PLT 702   Basic Metabolic Panel: Recent Labs  Lab 08/02/17 1759  NA 131*  K 3.0*  CL 95*  CO2 26  GLUCOSE 186*  BUN  34*  CREATININE 12.41*  CALCIUM 7.2*   GFR: Estimated Creatinine Clearance: 8.5 mL/min (A) (by C-G formula based on SCr of 12.41 mg/dL (H)). Liver Function Tests: Recent Labs  Lab 08/02/17 1759  AST 24  ALT 32  ALKPHOS 148*  BILITOT 0.3  PROT 6.8  ALBUMIN 1.7*   Recent Labs  Lab 08/02/17 1759  LIPASE 17   No results for input(s): AMMONIA in the last 168 hours. Coagulation Profile: No results for input(s): INR, PROTIME in the last 168 hours. Cardiac Enzymes: No results for input(s): CKTOTAL, CKMB, CKMBINDEX, TROPONINI in the last 168 hours. BNP (last 3 results) No results for input(s): PROBNP in the last 8760 hours. HbA1C: No results for input(s): HGBA1C in the last 72 hours. CBG: No results for input(s): GLUCAP in the last 168 hours. Lipid Profile: No results for input(s): CHOL, HDL, LDLCALC, TRIG, CHOLHDL, LDLDIRECT in the last 72 hours. Thyroid Function Tests: No  results for input(s): TSH, T4TOTAL, FREET4, T3FREE, THYROIDAB in the last 72 hours. Anemia Panel: No results for input(s): VITAMINB12, FOLATE, FERRITIN, TIBC, IRON, RETICCTPCT in the last 72 hours. Urine analysis:    Component Value Date/Time   COLORURINE YELLOW 12/30/2012 0241   APPEARANCEUR CLEAR 12/30/2012 0241   LABSPEC 1.010 12/30/2012 0241   PHURINE 6.5 12/30/2012 0241   GLUCOSEU NEGATIVE 12/30/2012 0241   HGBUR SMALL (A) 12/30/2012 0241   BILIRUBINUR NEGATIVE 12/30/2012 0241   KETONESUR NEGATIVE 12/30/2012 0241   PROTEINUR 100 (A) 12/30/2012 0241   UROBILINOGEN 1.0 12/30/2012 0241   NITRITE NEGATIVE 12/30/2012 0241   LEUKOCYTESUR NEGATIVE 12/30/2012 0241   Sepsis Labs: @LABRCNTIP (procalcitonin:4,lacticidven:4) )No results found for this or any previous visit (from the past 240 hour(s)).   Radiological Exams on Admission: Ct Abdomen Pelvis Wo Contrast  Addendum Date: 08/02/2017   ADDENDUM REPORT: 08/02/2017 19:40 ADDENDUM: After speaking with the clinician, area concern is the anterior abdominal wall in the lower abdomen. There is a soft tissue subcutaneous nodule anterior to the rectus muscle in the lower abdominal wall in the suprapubic region measuring up to 2.3 cm. Slight stranding noted in the subcutaneous soft tissues adjacent to this area. Cannot exclude cellulitis with small developing abscess. Electronically Signed   By: Rolm Baptise M.D.   On: 08/02/2017 19:40   Result Date: 08/02/2017 EXAM: CT ABDOMEN AND PELVIS WITHOUT CONTRAST TECHNIQUE: Multidetector CT imaging of the abdomen and pelvis was performed following the standard protocol without IV contrast. COMPARISON:  None. FINDINGS: Lower chest: Small left pleural effusion with left lower lobe atelectasis. Heart is borderline in size. Right lung base clear. Hepatobiliary: No focal hepatic abnormality. Gallbladder unremarkable. Pancreas: No focal abnormality or ductal dilatation. Spleen: No focal abnormality.  Normal  size. Adrenals/Urinary Tract: No adrenal abnormality. No focal renal abnormality. No stones or hydronephrosis. Urinary bladder is unremarkable. Stomach/Bowel: Bowel grossly unremarkable. No evidence of bowel obstruction. Appendix not visualized. Evaluation of the bowel is limited due to limited oral contrast and stranding/haziness throughout the mesentery. Vascular/Lymphatic: Scattered aortic and iliac calcifications. No aneurysm or adenopathy. Reproductive: No visible focal abnormality. Other: Small amount of free fluid in the pelvis. There are locules of free air noted. Peritoneal dialysis catheter noted in the pelvis. Free air is presumably related to recent peritoneal dialysis. Stranding/haziness noted throughout the mesentery, possibly related to 3rd spacing of fluids or fluid overload. Musculoskeletal: No acute bony abnormality. IMPRESSION: Small left pleural effusion with left base atelectasis. Peritoneal dialysis catheter in the pelvis with small amount of free fluid and  locules of free air, presumably related to recent dialysis. Appendix not visualized. No definite acute process in the abdomen or pelvis. Electronically Signed: By: Rolm Baptise M.D. On: 08/02/2017 18:00   Dg Chest 2 View  Result Date: 08/02/2017 CLINICAL DATA:  RLQ abdominal pain and knot in groin. Pt is on daily dialysis and has h/o CHF, PNA, DM. EXAM: CHEST  2 VIEW COMPARISON:  07/14/2017 FINDINGS: Cardiac silhouette is normal in size. No mediastinal or hilar masses. No convincing adenopathy. Right-sided tunneled dual lumen central venous catheter is stable, distal tip in the right atrium. Left pleural effusion and left mid to lower lung zones scarring is stable from the prior exam. Lungs otherwise clear with no evidence to suggest pneumonia or pulmonary edema. No right pleural effusion. No pneumothorax. Skeletal structures are intact. IMPRESSION: No acute cardiopulmonary disease. Stable chronic changes in the left lung and at the left  lung base, with a chronic left pleural effusion. Electronically Signed   By: Lajean Manes M.D.   On: 08/02/2017 17:57    EKG: Independently reviewed. Sinus rhythm, non-specific T-abnormality in lateral leads.   Assessment/Plan  1. Sepsis; cellulitis; ?peritonitis   - Presents with fever, leukocytosis, and abdominal pain  - There is a tender suprapubic nodule with CT-findings suggestive of cellulitis with developing abscess  - There is also generalized abdominal pain raising concern for peritonitis in this patient on PD  - Nephrology was consulted and recommended drawing peritoneal fluid for culture and gram stain, PD nurse contacted and requested to draw the sample; nurse states she can't be done tonight and will be done tomorrow   - Blood cultures were collected in ED  - Continue empiric broad-spectrum abx to cover cellulitis with abscess and possible SBP in penicillin-allergic patient    2. ESRD on PD  - No emergent indication for dialysis on admission  - Nephrology consulted   3. Hypertension  - BP at goal  - Continue Norvasc and Coreg    4. Insulin-dependent DM  - A1c was 11.9% in May 2018  - Managed at home with Novolin 70/30 4 units qD  - Follow CBG's and start a low-intensity SSI with Novolog    5. Hypokalemia  - Serum potassium is 3.0 on admission  - Replace cautiously with 20 mEq oral potassium in setting of ESRD  - Repeat chem panel in am    6. Anemia  - Hgb is 9.6 on admission, down from 12.9 last February  - Denies bleeding, will check anemia panel    DVT prophylaxis: SCD's Code Status: Full  Family Communication: Discussed with patient Disposition Plan: Admit to med-surg Consults called: Nephrology Admission status: Inpatient     Vianne Bulls, MD [facility] Pager [phone]  If 7PM-7AM, please contact night-coverage [email or web address] Password [email or web address]  08/02/2017, 11:19 PM

## 2017-08-03 DIAGNOSIS — D631 Anemia in chronic kidney disease: Secondary | ICD-10-CM | POA: Diagnosis not present

## 2017-08-03 DIAGNOSIS — K769 Liver disease, unspecified: Secondary | ICD-10-CM | POA: Diagnosis not present

## 2017-08-03 DIAGNOSIS — D509 Iron deficiency anemia, unspecified: Secondary | ICD-10-CM | POA: Diagnosis not present

## 2017-08-03 DIAGNOSIS — N186 End stage renal disease: Secondary | ICD-10-CM | POA: Diagnosis not present

## 2017-08-03 DIAGNOSIS — N2581 Secondary hyperparathyroidism of renal origin: Secondary | ICD-10-CM | POA: Diagnosis not present

## 2017-08-03 DIAGNOSIS — Z4932 Encounter for adequacy testing for peritoneal dialysis: Secondary | ICD-10-CM | POA: Diagnosis not present

## 2017-08-03 LAB — IRON AND TIBC: IRON: 9 ug/dL — AB (ref 45–182)

## 2017-08-03 LAB — CBC WITH DIFFERENTIAL/PLATELET
BAND NEUTROPHILS: 0 %
BASOS ABS: 0 10*3/uL (ref 0.0–0.1)
Basophils Relative: 0 %
Blasts: 0 %
EOS ABS: 0 10*3/uL (ref 0.0–0.7)
EOS PCT: 0 %
HCT: 27.9 % — ABNORMAL LOW (ref 39.0–52.0)
Hemoglobin: 9.1 g/dL — ABNORMAL LOW (ref 13.0–17.0)
Lymphocytes Relative: 15 %
Lymphs Abs: 2.4 10*3/uL (ref 0.7–4.0)
MCH: 29 pg (ref 26.0–34.0)
MCHC: 32.6 g/dL (ref 30.0–36.0)
MCV: 88.9 fL (ref 78.0–100.0)
METAMYELOCYTES PCT: 0 %
MONOS PCT: 6 %
Monocytes Absolute: 1 10*3/uL (ref 0.1–1.0)
Myelocytes: 0 %
NEUTROS ABS: 12.6 10*3/uL — AB (ref 1.7–7.7)
Neutrophils Relative %: 79 %
Other: 0 %
PLATELETS: 195 10*3/uL (ref 150–400)
Promyelocytes Absolute: 0 %
RBC: 3.14 MIL/uL — ABNORMAL LOW (ref 4.22–5.81)
RDW: 16.2 % — AB (ref 11.5–15.5)
WBC: 16 10*3/uL — ABNORMAL HIGH (ref 4.0–10.5)
nRBC: 0 /100 WBC

## 2017-08-03 LAB — PROCALCITONIN: PROCALCITONIN: 3.31 ng/mL

## 2017-08-03 LAB — BASIC METABOLIC PANEL
Anion gap: 11 (ref 5–15)
BUN: 32 mg/dL — AB (ref 6–20)
CHLORIDE: 96 mmol/L — AB (ref 101–111)
CO2: 25 mmol/L (ref 22–32)
CREATININE: 12.13 mg/dL — AB (ref 0.61–1.24)
Calcium: 6.8 mg/dL — ABNORMAL LOW (ref 8.9–10.3)
GFR calc Af Amer: 5 mL/min — ABNORMAL LOW (ref >60)
GFR calc non Af Amer: 4 mL/min — ABNORMAL LOW (ref >60)
GLUCOSE: 213 mg/dL — AB (ref 65–99)
Potassium: 2.8 mmol/L — ABNORMAL LOW (ref 3.5–5.1)
Sodium: 132 mmol/L — ABNORMAL LOW (ref 135–145)

## 2017-08-03 LAB — RETICULOCYTES
RBC.: 3.23 MIL/uL — AB (ref 4.22–5.81)
Retic Count, Absolute: 58.1 10*3/uL (ref 19.0–186.0)
Retic Ct Pct: 1.8 % (ref 0.4–3.1)

## 2017-08-03 LAB — GLUCOSE, CAPILLARY
GLUCOSE-CAPILLARY: 122 mg/dL — AB (ref 65–99)
GLUCOSE-CAPILLARY: 190 mg/dL — AB (ref 65–99)
GLUCOSE-CAPILLARY: 99 mg/dL (ref 65–99)
Glucose-Capillary: 93 mg/dL (ref 65–99)
Glucose-Capillary: 157 mg/dL — ABNORMAL HIGH (ref 65–99)

## 2017-08-03 LAB — MAGNESIUM: Magnesium: 3.4 mg/dL — ABNORMAL HIGH (ref 1.7–2.4)

## 2017-08-03 LAB — FERRITIN: FERRITIN: 1275 ng/mL — AB (ref 24–336)

## 2017-08-03 LAB — APTT: aPTT: 43 seconds — ABNORMAL HIGH (ref 24–36)

## 2017-08-03 LAB — FOLATE: Folate: 12 ng/mL (ref >5.9)

## 2017-08-03 LAB — VITAMIN B12: Vitamin B-12: 995 pg/mL — ABNORMAL HIGH (ref 180–914)

## 2017-08-03 LAB — MRSA PCR SCREENING: MRSA by PCR: POSITIVE — AB

## 2017-08-03 LAB — PROTIME-INR
INR: 1.46
PROTHROMBIN TIME: 17.6 s — AB (ref 11.4–15.2)

## 2017-08-03 MED ORDER — POTASSIUM CHLORIDE CRYS ER 20 MEQ PO TBCR
40.0000 meq | EXTENDED_RELEASE_TABLET | Freq: Once | ORAL | Status: AC
  Administered 2017-08-03: 40 meq via ORAL
  Filled 2017-08-03: qty 2

## 2017-08-03 MED ORDER — DELFLEX-LC/2.5% DEXTROSE 394 MOSM/L IP SOLN
INTRAPERITONEAL | Status: DC | PRN
  Filled 2017-08-03: qty 5000

## 2017-08-03 MED ORDER — DELFLEX-LC/2.5% DEXTROSE 394 MOSM/L IP SOLN: INTRAPERITONEAL | Status: DC

## 2017-08-03 MED ORDER — DELFLEX-LC/2.5% DEXTROSE 394 MOSM/L IP SOLN
INTRAPERITONEAL | Status: DC
  Administered 2017-08-04: 5000 mL via INTRAPERITONEAL

## 2017-08-03 MED ORDER — POTASSIUM CHLORIDE CRYS ER 20 MEQ PO TBCR
40.0000 meq | EXTENDED_RELEASE_TABLET | Freq: Two times a day (BID) | ORAL | Status: AC
  Administered 2017-08-03 (×2): 40 meq via ORAL
  Filled 2017-08-03 (×3): qty 2

## 2017-08-03 MED ORDER — HEPARIN 1000 UNIT/ML FOR PERITONEAL DIALYSIS
500.0000 [IU] | INTRAMUSCULAR | Status: DC | PRN
  Filled 2017-08-03: qty 0.5

## 2017-08-03 MED ORDER — CHLORHEXIDINE GLUCONATE CLOTH 2 % EX PADS
6.0000 | MEDICATED_PAD | Freq: Every day | CUTANEOUS | Status: AC
  Administered 2017-08-03 – 2017-08-07 (×3): 6 via TOPICAL

## 2017-08-03 MED ORDER — GENTAMICIN SULFATE 0.1 % EX OINT
TOPICAL_OINTMENT | Freq: Three times a day (TID) | CUTANEOUS | Status: DC
  Administered 2017-08-03: 11:00:00 via TOPICAL
  Filled 2017-08-03: qty 15

## 2017-08-03 MED ORDER — DELFLEX-LC/2.5% DEXTROSE 394 MOSM/L IP SOLN
Freq: Every day | INTRAPERITONEAL | Status: DC
  Administered 2017-08-03: 3000 mL via INTRAPERITONEAL

## 2017-08-03 MED ORDER — GENTAMICIN SULFATE 0.1 % EX CREA
1.0000 "application " | TOPICAL_CREAM | Freq: Every day | CUTANEOUS | Status: DC
  Administered 2017-08-03 – 2017-08-04 (×2): 1 via TOPICAL
  Filled 2017-08-03: qty 15

## 2017-08-03 MED ORDER — HEPARIN 1000 UNIT/ML FOR PERITONEAL DIALYSIS: 500.0000 [IU] | INTRAMUSCULAR | Status: DC | PRN

## 2017-08-03 MED ORDER — MUPIROCIN 2 % EX OINT
1.0000 "application " | TOPICAL_OINTMENT | Freq: Two times a day (BID) | CUTANEOUS | Status: AC
  Administered 2017-08-03 – 2017-08-07 (×10): 1 via NASAL
  Filled 2017-08-03 (×3): qty 22

## 2017-08-03 MED ORDER — CALCIUM ACETATE (PHOS BINDER) 667 MG PO CAPS
1334.0000 mg | ORAL_CAPSULE | Freq: Three times a day (TID) | ORAL | Status: DC
  Administered 2017-08-03 – 2017-08-08 (×13): 1334 mg via ORAL
  Filled 2017-08-03 (×14): qty 2

## 2017-08-03 MED ORDER — HEPARIN 1000 UNIT/ML FOR PERITONEAL DIALYSIS
INTRAPERITONEAL | Status: DC | PRN
  Administered 2017-08-03: 20:00:00 via INTRAPERITONEAL
  Filled 2017-08-03 (×17): qty 5000

## 2017-08-03 NOTE — Progress Notes (Signed)
Called HD an informed that patient will needs specimen collected from PD site. Spoke with Cornell Barman and she informed me specimen will be collected this AM

## 2017-08-03 NOTE — Consult Note (Signed)
[facility] Renal Consultation Note    Indication for Consultation:  Management of ESRD/hemodialysis; anemia, hypertension/volume and secondary hyperparathyroidism Primary Nephrologist: Justin Mend   HPI: Bryan Wilkerson is a 52 y.o. male. PMH DM, HTN, secondary HPT, anemia, history of hypocalcemia. Previously on HD, transitioned to peritoneal dialysis ([facility])~10/2016. Denies any history of peritonitis, and reports compliance with exchanges. Complains of intermittent diffuse lower quadrant abdominal pain. Some food triggers (lettuce, red sauce) Says will have loose stools and pain, then abates. Says episodes are becoming more frequent. Also developed a "knot" in his suprapubic area that was firm and tender. Has had some chills, fever. Went to [facility] ED earlier today. WBC elevated at 17,900, suprapubic knot had appearance on CT of possible abscess/cellulitus. Was dosed with Flagyl and Levaquin and transferred here. Pt says PD fluid has been clear.   Past Medical History:  Diagnosis Date  . Anemia   . CHF (congestive heart failure) ([place])   . Diabetic retinopathy ([place])   . ESRD on peritoneal dialysis ([place])    "7 days/week" (04/20/2017)  . Hypertension   . Pneumonia 2016; 04/19/2017  . Psoriasis   . Type I diabetes mellitus ([place])    Past Surgical History:  Procedure Laterality Date  . AV FISTULA PLACEMENT Right 12/05/2013   Procedure: RADIOCEPHALIC VS. BRACHIOCEPHALIC ARTERIOVENOUS (AV) FISTULA CREATION;  Surgeon: Conrad Nescopeck, MD;  Location: [place];  Service: Vascular;  Laterality: Right;  . AV FISTULA PLACEMENT Left 07/20/2016   Procedure: LEFT ARM RADIOCEPHALIC ARTERIOVENOUS (AV) FISTULA CREATION;  Surgeon: Waynetta Sandy, MD;  Location: [place];  Service: Vascular;  Laterality: Left;  . CATARACT EXTRACTION W/ INTRAOCULAR LENS  IMPLANT, BILATERAL Bilateral   . EYE SURGERY    . FISTULOGRAM Right 07/16/2014   Procedure: FISTULOGRAM;  Surgeon: Conrad Noel, MD;   Location: [place];  Service: Vascular;  Laterality: Right;  . IR THORACENTESIS ASP PLEURAL SPACE W/IMG GUIDE  05/02/2017  . LIGATION OF COMPETING BRANCHES OF ARTERIOVENOUS FISTULA Right 07/16/2014   Procedure: LIGATION OF COMPETING BRANCHES OF ARTERIOVENOUS FISTULA;  Surgeon: Conrad Towanda, MD;  Location: [place];  Service: Vascular;  Laterality: Right;  . PERITONEAL CATHETER INSERTION Left ~ 08/2016  . PORT-A-CATH REMOVAL  2017  . PORTA CATH INSERTION Right    "for hemodialysis"  . RETINAL DETACHMENT SURGERY Right    Family History  Problem Relation Age of Onset  . Hypertension Mother   . Heart attack Mother   . Chronic Renal Failure Neg Hx   . Diabetes Neg Hx   . Stroke Neg Hx   . Cancer Neg Hx    Social History:  reports that  has never smoked. he has never used smokeless tobacco. He reports that he does not drink alcohol or use drugs. Allergies  Allergen Reactions  . Penicillins Other (See Comments)    UNSPECIFIED REACTION FROM CHILDHOOD Has patient had a PCN reaction causing immediate rash, facial/tongue/throat swelling, SOB or lightheadedness with hypotension:Yes Has patient had a PCN reaction causing severe rash involving mucus membranes or skin necrosis:No Has patient had a PCN reaction that required hospitalization:Yes Has patient had a PCN reaction occurring within the last 10 years:No If all of the above answers are "NO", then may proceed with Cephalosporin use.     Prior to Admission medications   Medication Sig Start Date End Date Taking? Authorizing Provider  amLODipine (NORVASC) 5 MG tablet Take 5 mg by mouth daily. 03/24/17  Yes [provider]  calcitRIOL (ROCALTROL) 0.25 MCG capsule Take 2 capsules (0.5 mcg total) by mouth daily. 04/22/17  Yes Hongalgi, Lenis Dickinson, MD  carvedilol (COREG) 6.25 MG tablet Take 1 tablet (6.25 mg total) by mouth 2 (two) times daily with a meal. 11/27/16  Yes Hosie Poisson, MD  insulin NPH-regular Human (NOVOLIN 70/30) (70-30) 100  UNIT/ML injection Inject 4 Units into the skin daily.    Yes [provider]  sucroferric oxyhydroxide (VELPHORO) 500 MG chewable tablet Chew 500 mg by mouth 3 (three) times daily with meals.   Yes [provider]  triamcinolone ointment (KENALOG) 0.1 % Apply 1 application topically as needed (psorasis).  10/10/13  Yes [provider]  B Complex-C-Folic Acid (NEPHRO-VITE PO) Take 1 tablet by mouth every morning.    [provider]   . amLODipine  5 mg Oral Daily  . calcitRIOL  0.5 mcg Oral Daily  . carvedilol  6.25 mg Oral BID WC  . Chlorhexidine Gluconate Cloth  6 each Topical Q0600  . gentamicin ointment   Topical TID  . insulin aspart  0-5 Units Subcutaneous QHS  . insulin aspart  0-9 Units Subcutaneous TID WC  . mupirocin ointment  1 application Nasal BID  . sodium chloride flush  3 mL Intravenous Q12H  . sucroferric oxyhydroxide  500 mg Oral TID WC   . sodium chloride    . dialysis solution 2.5% low-MG/low-CA    . [START ON 08/04/2017] levofloxacin (LEVAQUIN) IV    . metronidazole Stopped (08/03/17 0622)    ROS:  As per HPI otherwise negative.  Physical Exam: Vitals:   08/02/17 2130 08/02/17 2314 08/03/17 0513 08/03/17 0854  BP: (!) 152/64 (!) 149/70 (!) 157/73 (!) 145/71  Pulse: 80 76 79 77  Resp: 15 18 16 16   Temp:  99.8 F (37.7 C) 99.7 F (37.6 C) 99.5 F (37.5 C)  TempSrc:  Oral Oral Oral  SpO2: 100% 100% 100% 100%  Weight:      Height:         General: WDWN NAD sitting up in bed Muddy sclerae, not icteric No JVD Lungs clear S1S2 No S3 Abd distended with PD fluid (manual exchange done in order to perform cell count) + BS Mild RLQ tenderness Irreg knot over suprapubic area, movable, about 1X3 cm, not fluctuant PD catheter exit sit dressed No LE edema Dialysis Access: PD cath and L upper AVF   Recent Labs  Lab 08/02/17 1759 08/03/17 0033  NA 131* 132*  K 3.0* 2.8*  CL 95* 96*  CO2 26 25  GLUCOSE 186* 213*  BUN 34*  32*  CREATININE 12.41* 12.13*  CALCIUM 7.2* 6.8*    Recent Labs  Lab 08/02/17 1759  AST 24  ALT 32  ALKPHOS 148*  BILITOT 0.3  PROT 6.8  ALBUMIN 1.7*   Recent Labs  Lab 08/02/17 1759  LIPASE 17    Recent Labs  Lab 08/02/17 1759 08/03/17 0033  WBC 17.9* 16.0*  NEUTROABS 15.3* 12.6*  HGB 9.6* 9.1*  HCT 29.5* 27.9*  MCV 87.8 88.9  PLT 222 195    Recent Labs  Lab 08/02/17 2343 08/03/17 0722 08/03/17 1141  GLUCAP 122* 93 157*    Recent Labs    08/03/17 0033  IRON 9*  TIBC NOT CALCULATED  FERRITIN 1,275*   Studies/Results: Ct Abdomen Pelvis Wo Contrast  Addendum Date: 08/02/2017   ADDENDUM REPORT: 08/02/2017 19:40 ADDENDUM: After speaking with the clinician, area concern is the anterior abdominal wall in the  lower abdomen. There is a soft tissue subcutaneous nodule anterior to the rectus muscle in the lower abdominal wall in the suprapubic region measuring up to 2.3 cm. Slight stranding noted in the subcutaneous soft tissues adjacent to this area. Cannot exclude cellulitis with small developing abscess. Electronically Signed   By: Rolm Baptise M.D.   On: 08/02/2017 19:40   Result Date: 08/02/2017 EXAM: CT ABDOMEN AND PELVIS WITHOUT CONTRAST TECHNIQUE: Multidetector CT imaging of the abdomen and pelvis was performed following the standard protocol without IV contrast. COMPARISON:  None. FINDINGS: Lower chest: Small left pleural effusion with left lower lobe atelectasis. Heart is borderline in size. Right lung base clear. Hepatobiliary: No focal hepatic abnormality. Gallbladder unremarkable. Pancreas: No focal abnormality or ductal dilatation. Spleen: No focal abnormality.  Normal size. Adrenals/Urinary Tract: No adrenal abnormality. No focal renal abnormality. No stones or hydronephrosis. Urinary bladder is unremarkable. Stomach/Bowel: Bowel grossly unremarkable. No evidence of bowel obstruction. Appendix not visualized. Evaluation of the bowel is limited due to limited  oral contrast and stranding/haziness throughout the mesentery. Vascular/Lymphatic: Scattered aortic and iliac calcifications. No aneurysm or adenopathy. Reproductive: No visible focal abnormality. Other: Small amount of free fluid in the pelvis. There are locules of free air noted. Peritoneal dialysis catheter noted in the pelvis. Free air is presumably related to recent peritoneal dialysis. Stranding/haziness noted throughout the mesentery, possibly related to 3rd spacing of fluids or fluid overload. Musculoskeletal: No acute bony abnormality. IMPRESSION: Small left pleural effusion with left base atelectasis. Peritoneal dialysis catheter in the pelvis with small amount of free fluid and locules of free air, presumably related to recent dialysis. Appendix not visualized. No definite acute process in the abdomen or pelvis. Electronically Signed: By: Rolm Baptise M.D. On: 08/02/2017 18:00   Dg Chest 2 View  Result Date: 08/02/2017 CLINICAL DATA:  RLQ abdominal pain and knot in groin. Pt is on daily dialysis and has h/o CHF, PNA, DM. EXAM: CHEST  2 VIEW COMPARISON:  07/14/2017 FINDINGS: Cardiac silhouette is normal in size. No mediastinal or hilar masses. No convincing adenopathy. Right-sided tunneled dual lumen central venous catheter is stable, distal tip in the right atrium. Left pleural effusion and left mid to lower lung zones scarring is stable from the prior exam. Lungs otherwise clear with no evidence to suggest pneumonia or pulmonary edema. No right pleural effusion. No pneumothorax. Skeletal structures are intact. IMPRESSION: No acute cardiopulmonary disease. Stable chronic changes in the left lung and at the left lung base, with a chronic left pleural effusion. Electronically Signed   By: Lajean Manes M.D.   On: 08/02/2017 17:57    Dialysis Orders:  [facility] 6 exchanges/day, fill volume 2.45 L, last fill 2.45, no daytime exchanges.  2.5 Ca 0.5 mg solution  Assessment/Plan:  1. Abdominal pain -  often post prandial, some food triggers, occurring more often per pt, associated with intermittent diarrhea. CT not informative. Has pending blood cultures. PD fluid has been clear, checking cell count for completeness with clear fluid would doubt PD associated. peritonitis. Additionally has "knot" on SP area that is suspicious for abscess (and probably unrelated to his recurrent abd pain). I would recommend getting GI consult for evaluation.  2. ESRD -  [facility] (only have low Ca solution here) Usual prescription 3. Hypokalemia - given 20 mEq on admission, K today lower at 2.8. Replete orally with 40 BID for 2 days, trend labs 4. Anemia - had Mircera 150 mcg on 07/28/17 5. Secondary HPT. Hypocalcemia. On Ca  acetate outpt. Resumed. Continue current calcitriol. Takes Velphoro AND ca acetate for phos   Jamal Maes, MD [place] [phone] Pager 08/03/2017, 1:35 PM

## 2017-08-03 NOTE — Progress Notes (Signed)
Hospitalist progress note   Bryan Wilkerson  GHW:[id number] DOB: 01/13/1966 DOA: 08/02/2017 PCP: [name], MD   Specialists:   Brief Narrative:  52 y/o ?  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 Seizure disorder on depakote, CHF NYHA cls iii-last EF 65% 09/2015 admit 1/30 2/2 to 1 wk h/o abd pain and fever--had a tender nodule in belly below umbilicus WBC on admit 78-LF abd pelvis? Nodule ant to rectus muscle Admitted for presumed peritonitis 2/2 vs cellutlits and developing abcess  Assessment & Plan:   Assessment:  The primary encounter diagnosis was Generalized abdominal pain. A diagnosis of Fever, unspecified fever cause was also pertinent to this visit.  Sepsis-continue vanc levaquin flagyll-narrow once dialysate can be obtained-trend cbc-appreciate renal input--follow dialysate cult HTn-cont amlodi[pine 5, coreg 6.25--might conisder placing abx in dilaysate as per nephro-probably needs 48 hours of Rx prior to this however DM ty 1-on NPH 4 u at home--holding, cont SSI-cbg 93-122 Chr Disat Hf Ef 65% HYHA iii-sable currently ESRD on PD-defer to renal planning-for AoRD cont Velphoro 500 tid Sz disorder-not currently on AED? Psoriasis-follow up with derm as OP   DVT prophylaxis: lovenox   Code Status:   full   Family Communication:    None   Disposition Plan: inpatient   Consultants:   renal  Procedures:   ct  Antimicrobials:   Vanc/leveaquin/flagyll   Subjective:  Awake alert no distress Feels improved compared to prior No cp No fever Overall no chills tells me has food related diarreha-inconsistent   Objective: Vitals:   08/02/17 2130 08/02/17 2314 08/03/17 0513 08/03/17 0854  BP: (!) 152/64 (!) 149/70 (!) 157/73 (!) 145/71  Pulse: 80 76 79 77  Resp: 15 18 16 16   Temp:  99.8 F (37.7 C) 99.7 F (37.6 C) 99.5 F (37.5 C)  TempSrc:  Oral Oral Oral  SpO2: 100% 100% 100% 100%  Weight:      Height:        Intake/Output  Summary (Last 24 hours) at 08/03/2017 1040 Last data filed at 08/03/2017 0857 Gross per 24 hour  Intake 490 ml  Output 0 ml  Net 490 ml   Filed Weights   08/02/17 1333  Weight: 87.1 kg (192 lb)    Examination: emi ncat cta b s1 s 2no m abd soft but tender nodule below [address] pioit between umbilicus and pubis Non fluctuant No le edema Neuro intact mving 4 limbs equally  Data Reviewed: I have personally reviewed following labs and imaging studies  CBC: Recent Labs  Lab 08/02/17 1759 08/03/17 0033  WBC 17.9* 16.0*  NEUTROABS 15.3* 12.6*  HGB 9.6* 9.1*  HCT 29.5* 27.9*  MCV 87.8 88.9  PLT 222 810   Basic Metabolic Panel: Recent Labs  Lab 08/02/17 1759 08/03/17 0033  NA 131* 132*  K 3.0* 2.8*  CL 95* 96*  CO2 26 25  GLUCOSE 186* 213*  BUN 34* 32*  CREATININE 12.41* 12.13*  CALCIUM 7.2* 6.8*  MG  --  3.4*   GFR: Estimated Creatinine Clearance: 8.7 mL/min (A) (by C-G formula based on SCr of 12.13 mg/dL (H)). Liver Function Tests: Recent Labs  Lab 08/02/17 1759  AST 24  ALT 32  ALKPHOS 148*  BILITOT 0.3  PROT 6.8  ALBUMIN 1.7*   Recent Labs  Lab 08/02/17 1759  LIPASE 17   No results for input(s): AMMONIA in the last 168 hours. Coagulation Profile: Recent Labs  Lab 08/03/17 0033  INR 1.46   Cardiac Enzymes: No results for input(s): CKTOTAL, CKMB, CKMBINDEX, TROPONINI in the last 168 hours. CBG: Recent Labs  Lab 08/02/17 2343 08/03/17 0722  GLUCAP 122* 93   Urine analysis:    Component Value Date/Time   COLORURINE YELLOW 12/30/2012 0241   APPEARANCEUR CLEAR 12/30/2012 0241   LABSPEC 1.010 12/30/2012 0241   PHURINE 6.5 12/30/2012 0241   GLUCOSEU NEGATIVE 12/30/2012 0241   HGBUR SMALL (A) 12/30/2012 0241   BILIRUBINUR NEGATIVE 12/30/2012 0241   KETONESUR NEGATIVE 12/30/2012 0241   PROTEINUR 100 (A) 12/30/2012 0241   UROBILINOGEN 1.0 12/30/2012 0241   NITRITE NEGATIVE 12/30/2012 0241   LEUKOCYTESUR NEGATIVE 12/30/2012 0241      Radiology Studies: Reviewed images personally in health database    Scheduled Meds: . amLODipine  5 mg Oral Daily  . calcitRIOL  0.5 mcg Oral Daily  . carvedilol  6.25 mg Oral BID WC  . Chlorhexidine Gluconate Cloth  6 each Topical Q0600  . insulin aspart  0-5 Units Subcutaneous QHS  . insulin aspart  0-9 Units Subcutaneous TID WC  . mupirocin ointment  1 application Nasal BID  . sodium chloride flush  3 mL Intravenous Q12H  . sucroferric oxyhydroxide  500 mg Oral TID WC   Continuous Infusions: . sodium chloride    . dialysis solution 2.5% low-MG/low-CA    . [START ON 08/04/2017] levofloxacin (LEVAQUIN) IV    . metronidazole Stopped (08/03/17 0622)     LOS: 1 day    Time spent: [place], MD [facility]   If 7PM-7AM, please contact night-coverage [email or web address] Password [email or web address] 08/03/2017, 10:40 AM

## 2017-08-04 DIAGNOSIS — N2581 Secondary hyperparathyroidism of renal origin: Secondary | ICD-10-CM | POA: Diagnosis not present

## 2017-08-04 DIAGNOSIS — E1129 Type 2 diabetes mellitus with other diabetic kidney complication: Secondary | ICD-10-CM | POA: Diagnosis not present

## 2017-08-04 DIAGNOSIS — K769 Liver disease, unspecified: Secondary | ICD-10-CM | POA: Diagnosis not present

## 2017-08-04 DIAGNOSIS — N186 End stage renal disease: Secondary | ICD-10-CM | POA: Diagnosis not present

## 2017-08-04 DIAGNOSIS — Z4932 Encounter for adequacy testing for peritoneal dialysis: Secondary | ICD-10-CM | POA: Diagnosis not present

## 2017-08-04 DIAGNOSIS — Z992 Dependence on renal dialysis: Secondary | ICD-10-CM | POA: Diagnosis not present

## 2017-08-04 DIAGNOSIS — D631 Anemia in chronic kidney disease: Secondary | ICD-10-CM | POA: Diagnosis not present

## 2017-08-04 DIAGNOSIS — D509 Iron deficiency anemia, unspecified: Secondary | ICD-10-CM | POA: Diagnosis not present

## 2017-08-04 LAB — BODY FLUID CELL COUNT WITH DIFFERENTIAL
EOS FL: 1 %
Lymphs, Fluid: NONE SEEN %
Monocyte-Macrophage-Serous Fluid: 2 % — ABNORMAL LOW (ref 50–90)
NEUTROPHIL FLUID: 97 % — AB (ref 0–25)
WBC FLUID: 5620 uL — AB (ref 0–1000)

## 2017-08-04 LAB — GLUCOSE, CAPILLARY
GLUCOSE-CAPILLARY: 105 mg/dL — AB (ref 65–99)
Glucose-Capillary: 160 mg/dL — ABNORMAL HIGH (ref 65–99)
Glucose-Capillary: 130 mg/dL — ABNORMAL HIGH (ref 65–99)
Glucose-Capillary: 151 mg/dL — ABNORMAL HIGH (ref 65–99)

## 2017-08-04 LAB — GRAM STAIN

## 2017-08-04 LAB — RENAL FUNCTION PANEL
Albumin: 1.5 g/dL — ABNORMAL LOW (ref 3.5–5.0)
Anion gap: 12 (ref 5–15)
BUN: 39 mg/dL — ABNORMAL HIGH (ref 6–20)
CHLORIDE: 98 mmol/L — AB (ref 101–111)
CO2: 24 mmol/L (ref 22–32)
CREATININE: 13.28 mg/dL — AB (ref 0.61–1.24)
Calcium: 7.2 mg/dL — ABNORMAL LOW (ref 8.9–10.3)
GFR, EST AFRICAN AMERICAN: 4 mL/min — AB (ref >60)
GFR, EST NON AFRICAN AMERICAN: 4 mL/min — AB (ref >60)
Glucose, Bld: 184 mg/dL — ABNORMAL HIGH (ref 65–99)
POTASSIUM: 3.8 mmol/L (ref 3.5–5.1)
Phosphorus: 4.9 mg/dL — ABNORMAL HIGH (ref 2.5–4.6)
Sodium: 134 mmol/L — ABNORMAL LOW (ref 135–145)

## 2017-08-04 LAB — CBC
HEMATOCRIT: 29.9 % — AB (ref 39.0–52.0)
HEMOGLOBIN: 9.9 g/dL — AB (ref 13.0–17.0)
MCH: 29.2 pg (ref 26.0–34.0)
MCHC: 33.1 g/dL (ref 30.0–36.0)
MCV: 88.2 fL (ref 78.0–100.0)
PLATELETS: 202 10*3/uL (ref 150–400)
RBC: 3.39 MIL/uL — ABNORMAL LOW (ref 4.22–5.81)
RDW: 16 % — ABNORMAL HIGH (ref 11.5–15.5)
WBC: 13.7 10*3/uL — AB (ref 4.0–10.5)

## 2017-08-04 MED ORDER — ALTEPLASE 2 MG IJ SOLR
INTRAMUSCULAR | Status: AC
  Administered 2017-08-04: 2 mg
  Filled 2017-08-04: qty 2

## 2017-08-04 MED ORDER — HEPARIN 1000 UNIT/ML FOR PERITONEAL DIALYSIS
2500.0000 [IU] | INTRAMUSCULAR | Status: DC | PRN
  Filled 2017-08-04: qty 2.5

## 2017-08-04 MED ORDER — GENTAMICIN SULFATE 0.1 % EX CREA
1.0000 "application " | TOPICAL_CREAM | Freq: Every day | CUTANEOUS | Status: DC
  Administered 2017-08-04 – 2017-08-06 (×2): 1 via TOPICAL
  Filled 2017-08-04: qty 15

## 2017-08-04 MED ORDER — ALTEPLASE 2 MG IJ SOLR
2.0000 mg | Freq: Once | INTRAMUSCULAR | Status: AC
  Administered 2017-08-04: 2 mg

## 2017-08-04 MED ORDER — HEPARIN 1000 UNIT/ML FOR PERITONEAL DIALYSIS
500.0000 [IU] | INTRAMUSCULAR | Status: DC | PRN
  Filled 2017-08-04: qty 0.5

## 2017-08-04 NOTE — Progress Notes (Signed)
PD tx attempted to start, pt hooked to machine, visible fibrin in tenckhoff line, machine push/pull but not draining, Dr. Posey Pronto called and made aware, he asked me to try a manual again to see if it would drain, it wouldn't, he was made aware, unable to get PD fluid sample, pt asked to stay hooked to machine in hopes he would start draining, report given to Rockie Neighbours, RN

## 2017-08-04 NOTE — Consult Note (Signed)
[facility] Consult/Admission Note  Bryan Wilkerson 10-18-1965  [id number].    Requesting MD: Dr. Verlon Au Chief Complaint/Reason for Consult: Mass in lower abdomen  HPI:   Pt is a 52 y.o. male with medical history significant for ESRD on peritoneal dialysis, hypertension, insulin-dependent type I diabetes mellitus, seizure disorder on depakote and psoriasis, who presented to the Ed for evaluation of abdominal pain for one week and fever on 01/29. Pt was admitted for presumed peritonitis. PD cath would not drain yesterday. Attempted to power flush, fluid goes in but no return of fluid. We were consulted for nodule in suprapubic region. It was seen on CT and described as There is a soft tissue subcutaneous nodule anterior to the rectus muscle in the lower abdominal wall in the suprapubic region measuring up to 2.3 cm. Slight stranding noted in the subcutaneous soft tissues adjacent to this area. Cannot exclude cellulitis with small developing abscess. Pt denies pain in this area. Pt noticed the bump 1 week ago. Not sure if it has gotten larger. No fevers currently. Pt does not make urine. He denies CP, SOB, changes in bowel habits.   ROS:  Review of Systems  Constitutional: Positive for fever (on arrival but not currenlty). Negative for chills.  Respiratory: Negative for cough and shortness of breath.   Cardiovascular: Negative for chest pain.  Gastrointestinal: Positive for abdominal pain. Negative for blood in stool, constipation, diarrhea and melena.  Genitourinary:       - pt does not make urine  Neurological: Negative for dizziness and loss of consciousness.  All other systems reviewed and are negative.    Family History  Problem Relation Age of Onset  . Hypertension Mother   . Heart attack Mother   . Chronic Renal Failure Neg Hx   . Diabetes Neg Hx   . Stroke Neg Hx   . Cancer Neg Hx     Past Medical History:  Diagnosis Date  . Anemia   . CHF (congestive heart  failure) ([place])   . Diabetic retinopathy ([place])   . ESRD on peritoneal dialysis ([place])    "7 days/week" (04/20/2017)  . Hypertension   . Pneumonia 2016; 04/19/2017  . Psoriasis   . Type I diabetes mellitus ([place])     Past Surgical History:  Procedure Laterality Date  . AV FISTULA PLACEMENT Right 12/05/2013   Procedure: RADIOCEPHALIC VS. BRACHIOCEPHALIC ARTERIOVENOUS (AV) FISTULA CREATION;  Surgeon: Conrad [place], MD;  Location: [facility];  Service: Vascular;  Laterality: Right;  . AV FISTULA PLACEMENT Left 07/20/2016   Procedure: LEFT ARM RADIOCEPHALIC ARTERIOVENOUS (AV) FISTULA CREATION;  Surgeon: Waynetta Sandy, MD;  Location: [facility];  Service: Vascular;  Laterality: Left;  . CATARACT EXTRACTION W/ INTRAOCULAR LENS  IMPLANT, BILATERAL Bilateral   . EYE SURGERY    . FISTULOGRAM Right 07/16/2014   Procedure: FISTULOGRAM;  Surgeon: Conrad [place], MD;  Location: [facility];  Service: Vascular;  Laterality: Right;  . IR THORACENTESIS ASP PLEURAL SPACE W/IMG GUIDE  05/02/2017  . LIGATION OF COMPETING BRANCHES OF ARTERIOVENOUS FISTULA Right 07/16/2014   Procedure: LIGATION OF COMPETING BRANCHES OF ARTERIOVENOUS FISTULA;  Surgeon: Conrad McFall, MD;  Location: [facility];  Service: Vascular;  Laterality: Right;  . PERITONEAL CATHETER INSERTION Left ~ 08/2016  . PORT-A-CATH REMOVAL  2017  . PORTA CATH INSERTION Right    "for hemodialysis"  . RETINAL DETACHMENT SURGERY Right     Social History:  reports that  has never smoked. he has  never used smokeless tobacco. He reports that he does not drink alcohol or use drugs.  Allergies:  Allergies  Allergen Reactions  . Penicillins Other (See Comments)    UNSPECIFIED REACTION FROM CHILDHOOD Has patient had a PCN reaction causing immediate rash, facial/tongue/throat swelling, SOB or lightheadedness with hypotension:Yes Has patient had a PCN reaction causing severe rash involving mucus membranes or skin necrosis:No Has patient had a PCN reaction that required  hospitalization:Yes Has patient had a PCN reaction occurring within the last 10 years:No If all of the above answers are "NO", then may proceed with Cephalosporin use.      Medications Prior to Admission  Medication Sig Dispense Refill  . amLODipine (NORVASC) 10 MG tablet Take 10 mg by mouth daily.    . B Complex-C-Folic Acid (NEPHRO-VITE PO) Take 1 tablet by mouth every morning.    . calcitRIOL (ROCALTROL) 0.25 MCG capsule Take 2 capsules (0.5 mcg total) by mouth daily. 30 capsule 0  . calcium acetate (PHOSLO) 667 MG capsule Take 1,334 mg by mouth 3 (three) times daily.    . carvedilol (COREG) 6.25 MG tablet Take 1 tablet (6.25 mg total) by mouth 2 (two) times daily with a meal. 60 tablet 0  . LEVEMIR FLEXTOUCH 100 UNIT/ML Pen Inject 2-8 Units into the skin daily at 10 pm. Sliding Scale    . sucroferric oxyhydroxide (VELPHORO) 500 MG chewable tablet Chew 500 mg by mouth 3 (three) times daily with meals.    . triamcinolone ointment (KENALOG) 0.1 % Apply 1 application topically as needed (psorasis).       Blood pressure 132/75, pulse 73, temperature 99 F (37.2 C), temperature source Oral, resp. rate 17, height _0  (1.905 m), weight 208 lb 1.8 oz (94.4 kg), SpO2 93 %.  Physical Exam  Constitutional: He is oriented to person, place, and time and well-developed, well-nourished, and in no distress. No distress.  HENT:  Head: Normocephalic and atraumatic.  Nose: Nose normal.  Eyes: Conjunctivae are normal. Right eye exhibits no discharge. Left eye exhibits no discharge. No scleral icterus.  Pupils are equal and round  Neck: Normal range of motion. Neck supple.  Cardiovascular: Normal rate, regular rhythm, normal heart sounds and intact distal pulses.  No murmur heard. Pulses:      Radial pulses are 2+ on the right side, and 2+ on the left side.  Pulmonary/Chest: Effort normal and breath sounds normal. No respiratory distress. He has no wheezes. He has no rhonchi. He has no rales.   Abdominal: Bowel sounds are normal. He exhibits distension and ascites. There is generalized tenderness and tenderness in the right lower quadrant and suprapubic area. There is no rigidity and no guarding. No hernia.  Unable to assess HSM, PD cath in place to left abdomen. Firm, roughly 5x3cm mass noted to abdomen in the suprapubic region that is nontender, mobile. No surrounding erythema. No fluctuance or induration noted.  Musculoskeletal: Normal range of motion. He exhibits no edema, tenderness or deformity.  Neurological: He is alert and oriented to person, place, and time.  Skin: Skin is warm and dry. He is not diaphoretic.  Psychiatric: Mood and affect normal.    Results for orders placed or performed during the hospital encounter of 08/02/17 (from the past 48 hour(s))  CBC with Differential     Status: Abnormal   Collection Time: 08/02/17  5:59 PM  Result Value Ref Range   WBC 17.9 (H) 4.0 - 10.5 K/uL   RBC 3.36 (L) 4.22 -  5.81 MIL/uL   Hemoglobin 9.6 (L) 13.0 - 17.0 g/dL   HCT 29.5 (L) 39.0 - 52.0 %   MCV 87.8 78.0 - 100.0 fL   MCH 28.6 26.0 - 34.0 pg   MCHC 32.5 30.0 - 36.0 g/dL   RDW 15.6 (H) 11.5 - 15.5 %   Platelets 222 150 - 400 K/uL   Neutrophils Relative % 86 %   Lymphocytes Relative 6 %   Monocytes Relative 7 %   Eosinophils Relative 1 %   Basophils Relative 0 %   Neutro Abs 15.3 (H) 1.7 - 7.7 K/uL   Lymphs Abs 1.1 0.7 - 4.0 K/uL   Monocytes Absolute 1.3 (H) 0.1 - 1.0 K/uL   Eosinophils Absolute 0.2 0.0 - 0.7 K/uL   Basophils Absolute 0.0 0.0 - 0.1 K/uL   Smear Review MORPHOLOGY UNREMARKABLE   Comprehensive metabolic panel     Status: Abnormal   Collection Time: 08/02/17  5:59 PM  Result Value Ref Range   Sodium 131 (L) 135 - 145 mmol/L   Potassium 3.0 (L) 3.5 - 5.1 mmol/L   Chloride 95 (L) 101 - 111 mmol/L   CO2 26 22 - 32 mmol/L   Glucose, Bld 186 (H) 65 - 99 mg/dL   BUN 34 (H) 6 - 20 mg/dL   Creatinine, Ser 12.41 (H) 0.61 - 1.24 mg/dL   Calcium 7.2 (L)  8.9 - 10.3 mg/dL   Total Protein 6.8 6.5 - 8.1 g/dL   Albumin 1.7 (L) 3.5 - 5.0 g/dL   AST 24 15 - 41 U/L   ALT 32 17 - 63 U/L   Alkaline Phosphatase 148 (H) 38 - 126 U/L   Total Bilirubin 0.3 0.3 - 1.2 mg/dL   GFR calc non Af Amer 4 (L) >60 mL/min   GFR calc Af Amer 5 (L) >60 mL/min    Comment: (NOTE) The eGFR has been calculated using the CKD EPI equation. This calculation has not been validated in all clinical situations. eGFR's persistently <60 mL/min signify possible Chronic Kidney Disease.    Anion gap 10 5 - 15  Lipase, blood     Status: None   Collection Time: 08/02/17  5:59 PM  Result Value Ref Range   Lipase 17 11 - 51 U/L  I-Stat CG4 Lactic Acid, ED  (not at  [facility])     Status: None   Collection Time: 08/02/17  6:11 PM  Result Value Ref Range   Lactic Acid, Venous 1.10 0.5 - 1.9 mmol/L  Blood Culture (routine x 2)     Status: None (Preliminary result)   Collection Time: 08/02/17  6:15 PM  Result Value Ref Range   Specimen Description BLOOD BLOOD RIGHT HAND    Special Requests      IN PEDIATRIC BOTTLE Blood Culture adequate volume Performed at [facility] [address], [place], [place] [address]    Culture PENDING    Report Status PENDING   MRSA PCR Screening     Status: Abnormal   Collection Time: 08/02/17 10:56 PM  Result Value Ref Range   MRSA by PCR POSITIVE (A) NEGATIVE    Comment:        The GeneXpert MRSA Assay (FDA approved for NASAL specimens only), is one component of a comprehensive MRSA colonization surveillance program. It is not intended to diagnose MRSA infection nor to guide or monitor treatment for MRSA infections. RESULT CALLED TO, READ BACK BY AND VERIFIED WITH: V. GYEKYE,RN [id number] 08/03/2017 T. TYSOR  Glucose, capillary     Status: Abnormal   Collection Time: 08/02/17 11:43 PM  Result Value Ref Range   Glucose-Capillary 122 (H) 65 - 99 mg/dL  Procalcitonin     Status: None   Collection Time: 08/03/17 12:33 AM  Result Value  Ref Range   Procalcitonin 3.31 ng/mL    Comment:        Interpretation: PCT > 2 ng/mL: Systemic infection (sepsis) is likely, unless other causes are known. (NOTE)       Sepsis PCT Algorithm           Lower Respiratory Tract                                      Infection PCT Algorithm    ----------------------------     ----------------------------         PCT < 0.25 ng/mL                PCT < 0.10 ng/mL         Strongly encourage             Strongly discourage   discontinuation of antibiotics    initiation of antibiotics    ----------------------------     -----------------------------       PCT 0.25 - 0.50 ng/mL            PCT 0.10 - 0.25 ng/mL               OR       >80% decrease in PCT            Discourage initiation of                                            antibiotics      Encourage discontinuation           of antibiotics    ----------------------------     -----------------------------         PCT >= 0.50 ng/mL              PCT 0.26 - 0.50 ng/mL               AND       <80% decrease in PCT              Encourage initiation of                                             antibiotics       Encourage continuation           of antibiotics    ----------------------------     -----------------------------        PCT >= 0.50 ng/mL                  PCT > 0.50 ng/mL               AND         increase in PCT                  Strongly encourage  initiation of antibiotics    Strongly encourage escalation           of antibiotics                                     -----------------------------                                           PCT <= 0.25 ng/mL                                                 OR                                        > 80% decrease in PCT                                     Discontinue / Do not initiate                                             antibiotics   Protime-INR     Status: Abnormal   Collection Time:  08/03/17 12:33 AM  Result Value Ref Range   Prothrombin Time 17.6 (H) 11.4 - 15.2 seconds   INR 1.46   APTT     Status: Abnormal   Collection Time: 08/03/17 12:33 AM  Result Value Ref Range   aPTT 43 (H) 24 - 36 seconds    Comment:        IF BASELINE aPTT IS ELEVATED, SUGGEST PATIENT RISK ASSESSMENT BE USED TO DETERMINE APPROPRIATE ANTICOAGULANT THERAPY.   Basic metabolic panel     Status: Abnormal   Collection Time: 08/03/17 12:33 AM  Result Value Ref Range   Sodium 132 (L) 135 - 145 mmol/L   Potassium 2.8 (L) 3.5 - 5.1 mmol/L   Chloride 96 (L) 101 - 111 mmol/L   CO2 25 22 - 32 mmol/L   Glucose, Bld 213 (H) 65 - 99 mg/dL   BUN 32 (H) 6 - 20 mg/dL   Creatinine, Ser 12.13 (H) 0.61 - 1.24 mg/dL   Calcium 6.8 (L) 8.9 - 10.3 mg/dL   GFR calc non Af Amer 4 (L) >60 mL/min   GFR calc Af Amer 5 (L) >60 mL/min    Comment: (NOTE) The eGFR has been calculated using the CKD EPI equation. This calculation has not been validated in all clinical situations. eGFR's persistently <60 mL/min signify possible Chronic Kidney Disease.    Anion gap 11 5 - 15  Magnesium     Status: Abnormal   Collection Time: 08/03/17 12:33 AM  Result Value Ref Range   Magnesium 3.4 (H) 1.7 - 2.4 mg/dL  CBC WITH DIFFERENTIAL     Status: Abnormal   Collection Time: 08/03/17 12:33 AM  Result Value Ref Range   WBC 16.0 (H) 4.0 - 10.5 K/uL   RBC 3.14 (  L) 4.22 - 5.81 MIL/uL   Hemoglobin 9.1 (L) 13.0 - 17.0 g/dL   HCT 27.9 (L) 39.0 - 52.0 %   MCV 88.9 78.0 - 100.0 fL   MCH 29.0 26.0 - 34.0 pg   MCHC 32.6 30.0 - 36.0 g/dL   RDW 16.2 (H) 11.5 - 15.5 %   Platelets 195 150 - 400 K/uL   Neutrophils Relative % 79 %   Lymphocytes Relative 15 %   Monocytes Relative 6 %   Eosinophils Relative 0 %   Basophils Relative 0 %   Band Neutrophils 0 %   Metamyelocytes Relative 0 %   Myelocytes 0 %   Promyelocytes Absolute 0 %   Blasts 0 %   nRBC 0 0 /100 WBC   Other 0 %   Neutro Abs 12.6 (H) 1.7 - 7.7 K/uL   Lymphs  Abs 2.4 0.7 - 4.0 K/uL   Monocytes Absolute 1.0 0.1 - 1.0 K/uL   Eosinophils Absolute 0.0 0.0 - 0.7 K/uL   Basophils Absolute 0.0 0.0 - 0.1 K/uL   Smear Review MORPHOLOGY UNREMARKABLE   Vitamin B12     Status: Abnormal   Collection Time: 08/03/17 12:33 AM  Result Value Ref Range   Vitamin B-12 995 (H) 180 - 914 pg/mL  Folate     Status: None   Collection Time: 08/03/17 12:33 AM  Result Value Ref Range   Folate 12.0 >5.9 ng/mL  Iron and TIBC     Status: Abnormal   Collection Time: 08/03/17 12:33 AM  Result Value Ref Range   Iron 9 (L) 45 - 182 ug/dL   TIBC NOT CALCULATED 250 - 450 ug/dL    Comment: DUE TO TRANSFERRIN RESUL LESS THAN 7m/dl   Saturation Ratios NOT CALCULATED 17.9 - 39.5 %   UIBC NOT CALCULATED ug/dL  Ferritin     Status: Abnormal   Collection Time: 08/03/17 12:33 AM  Result Value Ref Range   Ferritin 1,275 (H) 24 - 336 ng/mL  Reticulocytes     Status: Abnormal   Collection Time: 08/03/17 12:33 AM  Result Value Ref Range   Retic Ct Pct 1.8 0.4 - 3.1 %   RBC. 3.23 (L) 4.22 - 5.81 MIL/uL   Retic Count, Absolute 58.1 19.0 - 186.0 K/uL  Glucose, capillary     Status: None   Collection Time: 08/03/17  7:22 AM  Result Value Ref Range   Glucose-Capillary 93 65 - 99 mg/dL  Glucose, capillary     Status: Abnormal   Collection Time: 08/03/17 11:41 AM  Result Value Ref Range   Glucose-Capillary 157 (H) 65 - 99 mg/dL  Glucose, capillary     Status: Abnormal   Collection Time: 08/03/17  4:55 PM  Result Value Ref Range   Glucose-Capillary 190 (H) 65 - 99 mg/dL  Glucose, capillary     Status: None   Collection Time: 08/03/17 10:20 PM  Result Value Ref Range   Glucose-Capillary 99 65 - 99 mg/dL  Renal function panel     Status: Abnormal   Collection Time: 08/04/17  6:35 AM  Result Value Ref Range   Sodium 134 (L) 135 - 145 mmol/L   Potassium 3.8 3.5 - 5.1 mmol/L   Chloride 98 (L) 101 - 111 mmol/L   CO2 24 22 - 32 mmol/L   Glucose, Bld 184 (H) 65 - 99 mg/dL   BUN  39 (H) 6 - 20 mg/dL   Creatinine, Ser 13.28 (H) 0.61 - 1.24 mg/dL  Calcium 7.2 (L) 8.9 - 10.3 mg/dL   Phosphorus 4.9 (H) 2.5 - 4.6 mg/dL   Albumin 1.5 (L) 3.5 - 5.0 g/dL   GFR calc non Af Amer 4 (L) >60 mL/min   GFR calc Af Amer 4 (L) >60 mL/min    Comment: (NOTE) The eGFR has been calculated using the CKD EPI equation. This calculation has not been validated in all clinical situations. eGFR's persistently <60 mL/min signify possible Chronic Kidney Disease.    Anion gap 12 5 - 15  CBC     Status: Abnormal   Collection Time: 08/04/17  6:35 AM  Result Value Ref Range   WBC 13.7 (H) 4.0 - 10.5 K/uL   RBC 3.39 (L) 4.22 - 5.81 MIL/uL   Hemoglobin 9.9 (L) 13.0 - 17.0 g/dL   HCT 29.9 (L) 39.0 - 52.0 %   MCV 88.2 78.0 - 100.0 fL   MCH 29.2 26.0 - 34.0 pg   MCHC 33.1 30.0 - 36.0 g/dL   RDW 16.0 (H) 11.5 - 15.5 %   Platelets 202 150 - 400 K/uL  Glucose, capillary     Status: Abnormal   Collection Time: 08/04/17  7:48 AM  Result Value Ref Range   Glucose-Capillary 160 (H) 65 - 99 mg/dL   Ct Abdomen Pelvis Wo Contrast  Addendum Date: 08/02/2017   ADDENDUM REPORT: 08/02/2017 19:40 ADDENDUM: After speaking with the clinician, area concern is the anterior abdominal wall in the lower abdomen. There is a soft tissue subcutaneous nodule anterior to the rectus muscle in the lower abdominal wall in the suprapubic region measuring up to 2.3 cm. Slight stranding noted in the subcutaneous soft tissues adjacent to this area. Cannot exclude cellulitis with small developing abscess. Electronically Signed   By: Rolm Baptise M.D.   On: 08/02/2017 19:40   Result Date: 08/02/2017 EXAM: CT ABDOMEN AND PELVIS WITHOUT CONTRAST TECHNIQUE: Multidetector CT imaging of the abdomen and pelvis was performed following the standard protocol without IV contrast. COMPARISON:  None. FINDINGS: Lower chest: Small left pleural effusion with left lower lobe atelectasis. Heart is borderline in size. Right lung base clear.  Hepatobiliary: No focal hepatic abnormality. Gallbladder unremarkable. Pancreas: No focal abnormality or ductal dilatation. Spleen: No focal abnormality.  Normal size. Adrenals/Urinary Tract: No adrenal abnormality. No focal renal abnormality. No stones or hydronephrosis. Urinary bladder is unremarkable. Stomach/Bowel: Bowel grossly unremarkable. No evidence of bowel obstruction. Appendix not visualized. Evaluation of the bowel is limited due to limited oral contrast and stranding/haziness throughout the mesentery. Vascular/Lymphatic: Scattered aortic and iliac calcifications. No aneurysm or adenopathy. Reproductive: No visible focal abnormality. Other: Small amount of free fluid in the pelvis. There are locules of free air noted. Peritoneal dialysis catheter noted in the pelvis. Free air is presumably related to recent peritoneal dialysis. Stranding/haziness noted throughout the mesentery, possibly related to 3rd spacing of fluids or fluid overload. Musculoskeletal: No acute bony abnormality. IMPRESSION: Small left pleural effusion with left base atelectasis. Peritoneal dialysis catheter in the pelvis with small amount of free fluid and locules of free air, presumably related to recent dialysis. Appendix not visualized. No definite acute process in the abdomen or pelvis. Electronically Signed: By: Rolm Baptise M.D. On: 08/02/2017 18:00   Dg Chest 2 View  Result Date: 08/02/2017 CLINICAL DATA:  RLQ abdominal pain and knot in groin. Pt is on daily dialysis and has h/o CHF, PNA, DM. EXAM: CHEST  2 VIEW COMPARISON:  07/14/2017 FINDINGS: Cardiac silhouette is normal in size. No mediastinal  or hilar masses. No convincing adenopathy. Right-sided tunneled dual lumen central venous catheter is stable, distal tip in the right atrium. Left pleural effusion and left mid to lower lung zones scarring is stable from the prior exam. Lungs otherwise clear with no evidence to suggest pneumonia or pulmonary edema. No right  pleural effusion. No pneumothorax. Skeletal structures are intact. IMPRESSION: No acute cardiopulmonary disease. Stable chronic changes in the left lung and at the left lung base, with a chronic left pleural effusion. Electronically Signed   By: Lajean Manes M.D.   On: 08/02/2017 17:57      Assessment/Plan Principal Problem:   Sepsis ([place]) Active Problems:   Essential hypertension, benign   Hypokalemia   Anemia   ESRD on dialysis ([place])   Diabetes mellitus due to underlying condition, uncontrolled, with stage 4 chronic kidney disease, with long-term current use of insulin (H[facility])   Cellulitis and abscess of trunk  Mass in lower abdomen - CT showed There is a soft tissue subcutaneous nodule anterior to the rectus muscle in the lower abdominal wall in the suprapubic region measuring up to 2.3 cm. Slight stranding noted in the subcutaneous soft tissues adjacent to this area.  - likely a lymph node, is nontender, will monitor  We will follow for any arising surgical needs.   Kalman Drape, [facility] 08/04/2017, 11:11 AM Pager: [phone] Consults: [phone] Mon-Fri 7:00 am-4:30 pm Sat-Sun 7:00 am-11:30 am

## 2017-08-04 NOTE — Progress Notes (Signed)
[facility] Rounding Note  Subjective/Interval History:  PD fluid would not drain yesterday  Fibrin noted in tubing last night. For some reason despite not draining fluid, the machine continued to fill the pt - I don't know exactly how much fluid is actually in his abdomen... Attempted power flush - fluid goes in/no return.   Very uncomfortable (and not same abd pain he came in with)  Objective Vital signs in last 24 hours: Vitals:   08/03/17 2222 08/04/17 0600 08/04/17 0640 08/04/17 0700  BP: 139/70 135/77 132/75   Pulse: 74 75 73   Resp: 16 16 17    Temp: 98.4 F (36.9 C) 98 F (36.7 C) 99 F (37.2 C)   TempSrc: Oral Oral Oral   SpO2: 97% 93% 93%   Weight:    94.4 kg (208 lb 1.8 oz)  Height:       Weight change: 0.21 kg (7.4 oz)  Intake/Output Summary (Last 24 hours) at 08/04/2017 1022 Last data filed at 08/04/2017 0900 Gross per 24 hour  Intake 726 ml  Output 0 ml  Net 726 ml   Physical Exam:  Blood pressure 132/75, pulse 73, temperature 99 F (37.2 C), temperature source Oral, resp. rate 17, height 6\' 3"  (1.905 m), weight 94.4 kg (208 lb 1.8 oz), SpO2 93 %.   WDWN NAD sitting up in bed - VERY uncomfortable 2/2 excessive amount of PD fluid in abdomen Muddy sclerae, not icteric No JVD Lungs clear S1S2 No S3 Abd distended/tight with PD fluid PD cath left lower abd exit sit dressed Able to flush saline into catheter, unable to withdraw anything Irreg knot over suprapubic area, movable, about 1X3 cm, not fluctuant No LE edema Dialysis Access: PD cath and L upper AVF   Recent Labs  Lab 08/02/17 1759 08/03/17 0033 08/04/17 0635  NA 131* 132* 134*  K 3.0* 2.8* 3.8  CL 95* 96* 98*  CO2 26 25 24   GLUCOSE 186* 213* 184*  BUN 34* 32* 39*  CREATININE 12.41* 12.13* 13.28*  CALCIUM 7.2* 6.8* 7.2*  PHOS  --   --  4.9*    Recent Labs  Lab 08/02/17 1759 08/04/17 0635  AST 24  --   ALT 32  --   ALKPHOS 148*  --   BILITOT 0.3  --   PROT 6.8  --   ALBUMIN 1.7* 1.5*    Recent Labs  Lab 08/02/17 1759  LIPASE 17   Recent Labs  Lab 08/02/17 1759 08/03/17 0033 08/04/17 0635  WBC 17.9* 16.0* 13.7*  NEUTROABS 15.3* 12.6*  --   HGB 9.6* 9.1* 9.9*  HCT 29.5* 27.9* 29.9*  MCV 87.8 88.9 88.2  PLT 222 195 202    Recent Labs  Lab 08/03/17 0722 08/03/17 1141 08/03/17 1655 08/03/17 2220 08/04/17 0748  GLUCAP 93 157* 190* 99 160*     Recent Labs  Lab 08/03/17 0033  IRON 9*  TIBC NOT CALCULATED  FERRITIN 1,275*   Studies/Results: Ct Abdomen Pelvis Wo Contrast  Addendum Date: 08/02/2017   ADDENDUM REPORT: 08/02/2017 19:40 ADDENDUM: After speaking with the clinician, area concern is the anterior abdominal wall in the lower abdomen. There is a soft tissue subcutaneous nodule anterior to the rectus muscle in the lower abdominal wall in the suprapubic region measuring up to 2.3 cm. Slight stranding noted in the subcutaneous soft tissues adjacent to this area. Cannot exclude cellulitis with small developing abscess. Electronically Signed   By: Rolm Baptise M.D.   On: 08/02/2017 19:40  Result Date: 08/02/2017 EXAM: CT ABDOMEN AND PELVIS WITHOUT CONTRAST TECHNIQUE: Multidetector CT imaging of the abdomen and pelvis was performed following the standard protocol without IV contrast. COMPARISON:  None. FINDINGS: Lower chest: Small left pleural effusion with left lower lobe atelectasis. Heart is borderline in size. Right lung base clear. Hepatobiliary: No focal hepatic abnormality. Gallbladder unremarkable. Pancreas: No focal abnormality or ductal dilatation. Spleen: No focal abnormality.  Normal size. Adrenals/Urinary Tract: No adrenal abnormality. No focal renal abnormality. No stones or hydronephrosis. Urinary bladder is unremarkable. Stomach/Bowel: Bowel grossly unremarkable. No evidence of bowel obstruction. Appendix not visualized. Evaluation of the bowel is limited due to limited oral contrast and stranding/haziness throughout the mesentery.  Vascular/Lymphatic: Scattered aortic and iliac calcifications. No aneurysm or adenopathy. Reproductive: No visible focal abnormality. Other: Small amount of free fluid in the pelvis. There are locules of free air noted. Peritoneal dialysis catheter noted in the pelvis. Free air is presumably related to recent peritoneal dialysis. Stranding/haziness noted throughout the mesentery, possibly related to 3rd spacing of fluids or fluid overload. Musculoskeletal: No acute bony abnormality. IMPRESSION: Small left pleural effusion with left base atelectasis. Peritoneal dialysis catheter in the pelvis with small amount of free fluid and locules of free air, presumably related to recent dialysis. Appendix not visualized. No definite acute process in the abdomen or pelvis. Electronically Signed: By: Rolm Baptise M.D. On: 08/02/2017 18:00   Dg Chest 2 View  Result Date: 08/02/2017 CLINICAL DATA:  RLQ abdominal pain and knot in groin. Pt is on daily dialysis and has h/o CHF, PNA, DM. EXAM: CHEST  2 VIEW COMPARISON:  07/14/2017 FINDINGS: Cardiac silhouette is normal in size. No mediastinal or hilar masses. No convincing adenopathy. Right-sided tunneled dual lumen central venous catheter is stable, distal tip in the right atrium. Left pleural effusion and left mid to lower lung zones scarring is stable from the prior exam. Lungs otherwise clear with no evidence to suggest pneumonia or pulmonary edema. No right pleural effusion. No pneumothorax. Skeletal structures are intact. IMPRESSION: No acute cardiopulmonary disease. Stable chronic changes in the left lung and at the left lung base, with a chronic left pleural effusion. Electronically Signed   By: Lajean Manes M.D.   On: 08/02/2017 17:57   Medications: . sodium chloride    . dialysis solution 2.5% low-MG/low-CA    . levofloxacin (LEVAQUIN) IV    . metronidazole Stopped (08/04/17 0630)   . alteplase      . amLODipine  5 mg Oral Daily  . calcitRIOL  0.5 mcg Oral  Daily  . calcium acetate  1,334 mg Oral TID WC  . carvedilol  6.25 mg Oral BID WC  . Chlorhexidine Gluconate Cloth  6 each Topical Q0600  . gentamicin cream  1 application Topical Daily  . gentamicin ointment   Topical TID  . insulin aspart  0-5 Units Subcutaneous QHS  . insulin aspart  0-9 Units Subcutaneous TID WC  . mupirocin ointment  1 application Nasal BID  . sodium chloride flush  3 mL Intravenous Q12H  . sucroferric oxyhydroxide  500 mg Oral TID WC   Dialysis Orders:  [facility] 6 exchanges/day, fill volume 2.45 L, last fill 2.45, no daytime exchanges.  2.5 Ca 0.5 mg solution  Assessment/Plan:  1. Abdominal pain - often post prandial, some food triggers, occurring more often per pt, associated with intermittent diarrhea. CT not informative. Has pending blood cultures. PD fluid has been clear, checking cell count for completeness (but unable to send  cell count yet 2/2 drainage issues). PTA fluid clear.    Additionally has "knot" on SP area that is suspicious for abscess (and probably unrelated to his recurrent abd pain). I would recommend getting GI consult for evaluation.   2. ESRD -  [facility]. Unable to drain PD fluid yesterday. Fibrin noted in tubing last night. For some reason despite not draining fluid, the machine continued to fill the pt - I don't know exactly how much fluid is actually in his abdomen... Attempted power flush - fluid goes in/no return.  1. Plan activase 2 mg in 10 cc sterile water to dwell for 1 hour the re attempt to drain 3. Hypokalemia - given 20 mEq on admission, 40 last PM and this AM and K 3.8. Stop further supplements.  4. Anemia - had Mircera 150 mcg on 07/28/17. Iron extremely low. Will need IV Fe once off ATB's. 5. Secondary HPT. Hypocalcemia. On Ca acetate outpt. Resumed. Continue current calcitriol. Takes Velphoro AND ca acetate for phos  Jamal Maes, MD [facility] [phone] Pager 08/04/2017, 10:32 AM

## 2017-08-04 NOTE — Progress Notes (Addendum)
Hospitalist progress note   Bryan Wilkerson  IRC:[id number] DOB: 1965-08-26 DOA: 08/02/2017 PCP: [name], MD   Specialists:   Brief Narrative:  52 y/o ?  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 Seizure disorder on depakote, CHF NYHA cls iii-last EF 65% 09/2015 admit 1/30 2/2 to 1 wk h/o abd pain and fever--had a tender nodule in belly below umbilicus WBC on admit 52-WC abd pelvis? Nodule ant to rectus muscle Admitted for presumed peritonitis 2/2 vs cellutlits and developing abcess  Assessment & Plan:   Assessment:  The primary encounter diagnosis was Generalized abdominal pain. A diagnosis of Fever, unspecified fever cause was also pertinent to this visit.  Sepsis-continue vanc levaquin flagyll-narrow once dialysate cult trend cbc-appreciate renal input. abx in dialysate as per nephro--Fluid looked turbid to my exam and he is having some abd pain--medicated for this with opiates for now--Gen surg saw patient 1/31 re: knot in abdomen and no thougths of this being overtly infectious-defer decision re: surgery to them HTn-cont amlodipine 5, coreg 6.25 DM ty 1-on NPH 4 u at home--holding, cont SSI-cbg 99-130 Chr Disat Hf Ef 65% HYHA iii-stable currently ESRD on PD-defer to renal planning-for AoRD cont Velphoro 500 tid Sz disorder-not currently on AED? Hy[pokalemia-K2.8 -->3.8 with supp,ementation and this was stopped-rpt labs am Psoriasis-follow up with derm as OP Diarrhea-seems food dependant-Has been subacute/chronic-will need OP GI follow up    DVT prophylaxis: lovenox   Code Status:   full   Family Communication:    None   Disposition Plan: inpatient   Consultants:   renal  Procedures:   ct  Antimicrobials:   Vanc/leveaquin/flagyll   Subjective:  abd tense and PD not able to be done ealier Have been able to finally remove fluid Still having some pain No fever no chills-abd warm however   Objective: Vitals:   08/03/17 2222 08/04/17  0600 08/04/17 0640 08/04/17 0700  BP: 139/70 135/77 132/75   Pulse: 74 75 73   Resp: 16 16 17    Temp: 98.4 F (36.9 C) 98 F (36.7 C) 99 F (37.2 C)   TempSrc: Oral Oral Oral   SpO2: 97% 93% 93%   Weight:    94.4 kg (208 lb 1.8 oz)  Height:        Intake/Output Summary (Last 24 hours) at 08/04/2017 1356 Last data filed at 08/04/2017 0900 Gross per 24 hour  Intake 486 ml  Output 0 ml  Net 486 ml   Filed Weights   08/03/17 1645 08/03/17 1945 08/04/17 0700  Weight: 90.2 kg (198 lb 13.7 oz) 91.2 kg (201 lb 1 oz) 94.4 kg (208 lb 1.8 oz)    Examination:  eomi ncat-no ict no pallor s1 s 2 slght tachy abd tender and warm to touch no rebound no guard No le edema  Data Reviewed: I have personally reviewed following labs and imaging studies  CBC: Recent Labs  Lab 08/02/17 1759 08/03/17 0033 08/04/17 0635  WBC 17.9* 16.0* 13.7*  NEUTROABS 15.3* 12.6*  --   HGB 9.6* 9.1* 9.9*  HCT 29.5* 27.9* 29.9*  MCV 87.8 88.9 88.2  PLT 222 195 585   Basic Metabolic Panel: Recent Labs  Lab 08/02/17 1759 08/03/17 0033 08/04/17 0635  NA 131* 132* 134*  K 3.0* 2.8* 3.8  CL 95* 96* 98*  CO2 26 25 24   GLUCOSE 186* 213* 184*  BUN 34* 32* 39*  CREATININE 12.41* 12.13* 13.28*  CALCIUM 7.2* 6.8* 7.2*  MG  --  3.4*  --   PHOS  --   --  4.9*   GFR: Estimated Creatinine Clearance: 8 mL/min (A) (by C-G formula based on SCr of 13.28 mg/dL (H)). Liver Function Tests: Recent Labs  Lab 08/02/17 1759 08/04/17 0635  AST 24  --   ALT 32  --   ALKPHOS 148*  --   BILITOT 0.3  --   PROT 6.8  --   ALBUMIN 1.7* 1.5*   Recent Labs  Lab 08/02/17 1759  LIPASE 17   No results for input(s): AMMONIA in the last 168 hours. Coagulation Profile: Recent Labs  Lab 08/03/17 0033  INR 1.46   Cardiac Enzymes: No results for input(s): CKTOTAL, CKMB, CKMBINDEX, TROPONINI in the last 168 hours. CBG: Recent Labs  Lab 08/03/17 1141 08/03/17 1655 08/03/17 2220 08/04/17 0748 08/04/17 1234   GLUCAP 157* 190* 99 160* 130*   Urine analysis:    Component Value Date/Time   COLORURINE YELLOW 12/30/2012 [place] 12/30/2012 0241   LABSPEC 1.010 12/30/2012 0241   PHURINE 6.5 12/30/2012 0241   GLUCOSEU NEGATIVE 12/30/2012 0241   HGBUR SMALL (A) 12/30/2012 0241   BILIRUBINUR NEGATIVE 12/30/2012 0241   KETONESUR NEGATIVE 12/30/2012 0241   PROTEINUR 100 (A) 12/30/2012 0241   UROBILINOGEN 1.0 12/30/2012 0241   NITRITE NEGATIVE 12/30/2012 0241   LEUKOCYTESUR NEGATIVE 12/30/2012 0241     Radiology Studies: Reviewed images personally in health database    Scheduled Meds: . amLODipine  5 mg Oral Daily  . calcitRIOL  0.5 mcg Oral Daily  . calcium acetate  1,334 mg Oral TID WC  . carvedilol  6.25 mg Oral BID WC  . Chlorhexidine Gluconate Cloth  6 each Topical Q0600  . gentamicin cream  1 application Topical Daily  . gentamicin ointment   Topical TID  . insulin aspart  0-5 Units Subcutaneous QHS  . insulin aspart  0-9 Units Subcutaneous TID WC  . mupirocin ointment  1 application Nasal BID  . sodium chloride flush  3 mL Intravenous Q12H  . sucroferric oxyhydroxide  500 mg Oral TID WC   Continuous Infusions: . sodium chloride    . dialysis solution 2.5% low-MG/low-CA    . levofloxacin (LEVAQUIN) IV    . metronidazole Stopped (08/04/17 1308)     LOS: 2 days    Time spent: [place], MD [facility]   If 7PM-7AM, please contact night-coverage [email or web address] Password [email or web address] 08/04/2017, 1:56 PM

## 2017-08-05 DIAGNOSIS — N186 End stage renal disease: Secondary | ICD-10-CM | POA: Diagnosis not present

## 2017-08-05 DIAGNOSIS — D509 Iron deficiency anemia, unspecified: Secondary | ICD-10-CM | POA: Diagnosis not present

## 2017-08-05 DIAGNOSIS — R17 Unspecified jaundice: Secondary | ICD-10-CM | POA: Diagnosis not present

## 2017-08-05 DIAGNOSIS — K658 Other peritonitis: Secondary | ICD-10-CM | POA: Diagnosis not present

## 2017-08-05 DIAGNOSIS — K650 Generalized (acute) peritonitis: Secondary | ICD-10-CM | POA: Diagnosis not present

## 2017-08-05 DIAGNOSIS — Z992 Dependence on renal dialysis: Secondary | ICD-10-CM | POA: Diagnosis not present

## 2017-08-05 DIAGNOSIS — E440 Moderate protein-calorie malnutrition: Secondary | ICD-10-CM | POA: Diagnosis not present

## 2017-08-05 DIAGNOSIS — E1129 Type 2 diabetes mellitus with other diabetic kidney complication: Secondary | ICD-10-CM | POA: Diagnosis not present

## 2017-08-05 LAB — RENAL FUNCTION PANEL
ALBUMIN: 1.4 g/dL — AB (ref 3.5–5.0)
ANION GAP: 9 (ref 5–15)
BUN: 42 mg/dL — ABNORMAL HIGH (ref 6–20)
CALCIUM: 7.5 mg/dL — AB (ref 8.9–10.3)
CO2: 25 mmol/L (ref 22–32)
CREATININE: 14.2 mg/dL — AB (ref 0.61–1.24)
Chloride: 98 mmol/L — ABNORMAL LOW (ref 101–111)
GFR calc non Af Amer: 3 mL/min — ABNORMAL LOW (ref >60)
GFR, EST AFRICAN AMERICAN: 4 mL/min — AB (ref >60)
Glucose, Bld: 265 mg/dL — ABNORMAL HIGH (ref 65–99)
PHOSPHORUS: 4.8 mg/dL — AB (ref 2.5–4.6)
Potassium: 3.9 mmol/L (ref 3.5–5.1)
SODIUM: 132 mmol/L — AB (ref 135–145)

## 2017-08-05 LAB — CBC
HCT: 29.9 % — ABNORMAL LOW (ref 39.0–52.0)
Hemoglobin: 9.4 g/dL — ABNORMAL LOW (ref 13.0–17.0)
MCH: 27.8 pg (ref 26.0–34.0)
MCHC: 31.4 g/dL (ref 30.0–36.0)
MCV: 88.5 fL (ref 78.0–100.0)
Platelets: 220 10*3/uL (ref 150–400)
RBC: 3.38 MIL/uL — AB (ref 4.22–5.81)
RDW: 15.9 % — ABNORMAL HIGH (ref 11.5–15.5)
WBC: 15 10*3/uL — ABNORMAL HIGH (ref 4.0–10.5)

## 2017-08-05 LAB — GLUCOSE, CAPILLARY
GLUCOSE-CAPILLARY: 309 mg/dL — AB (ref 65–99)
GLUCOSE-CAPILLARY: 264 mg/dL — AB (ref 65–99)
GLUCOSE-CAPILLARY: 167 mg/dL — AB (ref 65–99)
Glucose-Capillary: 35 mg/dL — CL (ref 65–99)
Glucose-Capillary: 49 mg/dL — ABNORMAL LOW (ref 65–99)
Glucose-Capillary: 128 mg/dL — ABNORMAL HIGH (ref 65–99)

## 2017-08-05 LAB — HEPATITIS B SURFACE ANTIGEN: Hepatitis B Surface Ag: NEGATIVE

## 2017-08-05 MED ORDER — DEXTROSE 50 % IV SOLN
INTRAVENOUS | Status: AC
  Administered 2017-08-05: 50 mL
  Filled 2017-08-05: qty 50

## 2017-08-05 NOTE — Progress Notes (Signed)
Pharmacy Antibiotic Note  Bryan Wilkerson is a 52 y.o. male admitted on 08/02/2017 with cellulitis/abscess and intra-abdominal infection.  Pharmacy has been consulted for vancomycin dosing.  Also on Levaquin and Flagyl.  The patient is CCPD with peritoneal fluid now growing GPC. The patient received a Vancomycin loading dose on 1/30 of 2g. Will plan to check a Vancomycin random on 2/2 AM and re-dose if the level is <20 mcg/ml.   Discussed with renal on 2/1 and given the complexity of getting IP antibiotics over the weekend - will continue to dose Vancomycin IV for now.   Plan: - No standing Vancomycin doses for now - Will check a VR on 2/2 AM and re-dose Vanc if the level is <20 mcg/ml - D/c LVQ + Flagyl per discussion with MD - Will continue to follow PD schedule/duration, culture results, LOT, and antibiotic de-escalation plans   Height: 6\' 3"  (190.5 cm) Weight: 194 lb 7.1 oz (88.2 kg) IBW/kg (Calculated) : 84.5  Temp (24hrs), Avg:98.9 F (37.2 C), Min:98.6 F (37 C), Max:99.3 F (37.4 C)  Recent Labs  Lab 08/02/17 1759 08/02/17 1811 08/03/17 0033 08/04/17 0635 08/05/17 0430  WBC 17.9*  --  16.0* 13.7* 15.0*  CREATININE 12.41*  --  12.13* 13.28* 14.20*  LATICACIDVEN  --  1.10  --   --   --     Estimated Creatinine Clearance: 7.4 mL/min (A) (by C-G formula based on SCr of 14.2 mg/dL (H)).    Allergies  Allergen Reactions  . Penicillins Other (See Comments)    UNSPECIFIED REACTION FROM CHILDHOOD Has patient had a PCN reaction causing immediate rash, facial/tongue/throat swelling, SOB or lightheadedness with hypotension:Yes Has patient had a PCN reaction causing severe rash involving mucus membranes or skin necrosis:No Has patient had a PCN reaction that required hospitalization:Yes Has patient had a PCN reaction occurring within the last 10 years:No If all of the above answers are "NO", then may proceed with Cephalosporin use.      Vanc 1/30 >> LVQ 1/29 >> 2/1 Flagyl  1/29 >> 2/1  1/29 BCx >> 1/29 MRSA PCR >> positive 1/30 PD fluid >> gram positive cocci  Thank you for allowing pharmacy to be a part of this patient's care.  Alycia Rossetti, PharmD, BCPS Clinical Pharmacist Pager: [phone] Clinical phone for 08/05/2017 from 7a-3:30p: [phone] If after 3:30p, please call main pharmacy at: [phone] 08/05/2017 8:44 AM

## 2017-08-05 NOTE — Progress Notes (Signed)
[facility] Rounding Note  Subjective/Interval History:   Fibrin plug resolved post activase of PD cath Was able to get his PD last PM Cell count finally sent with 5620 WBC's, 97% polys GPC on gram stain ATB's narrowed to vanco Loaded 2 gm 1/30  For level on 2/2   Objective Vital signs in last 24 hours: Vitals:   08/04/17 0700 08/04/17 1315 08/04/17 1905 08/05/17 0440  BP:  140/81 (!) 105/49 117/66  Pulse:  72 80 73  Resp:  18 20 18   Temp:  98.8 F (37.1 C) 99.3 F (37.4 C) 98.6 F (37 C)  TempSrc:  Oral Oral Oral  SpO2:  98% 94% 92%  Weight: 94.4 kg (208 lb 1.8 oz)  88.2 kg (194 lb 7.1 oz)   Height:       Weight change: 0.9 kg (1 lb 15.7 oz)  Intake/Output Summary (Last 24 hours) at 08/05/2017 1218 Last data filed at 08/05/2017 1100 Gross per 24 hour  Intake 15629 ml  Output 15922 ml  Net -293 ml   Physical Exam:  Blood pressure 117/66, pulse 73, temperature 98.6 F (37 C), temperature source Oral, resp. rate 18, height 6\' 3"  (1.905 m), weight 88.2 kg (194 lb 7.1 oz), SpO2 92 %.   WDWN NAD sitting up in bed  NAD Muddy sclerae, not icteric No JVD Lungs clear S1S2 No S3 Abdomen only minimally tender now PD cath left lower abd exit sit dressed Irreg knot over suprapubic area, movable, about 1X3 cm, not fluctuant and not tender 1+ edema Dialysis Access: PD cath and L upper AVF   Recent Labs  Lab 08/02/17 1759 08/03/17 0033 08/04/17 0635 08/05/17 0430  NA 131* 132* 134* 132*  K 3.0* 2.8* 3.8 3.9  CL 95* 96* 98* 98*  CO2 26 25 24 25   GLUCOSE 186* 213* 184* 265*  BUN 34* 32* 39* 42*  CREATININE 12.41* 12.13* 13.28* 14.20*  CALCIUM 7.2* 6.8* 7.2* 7.5*  PHOS  --   --  4.9* 4.8*    Recent Labs  Lab 08/02/17 1759 08/04/17 0635 08/05/17 0430  AST 24  --   --   ALT 32  --   --   ALKPHOS 148*  --   --   BILITOT 0.3  --   --   PROT 6.8  --   --   ALBUMIN 1.7* 1.5* 1.4*   Recent Labs  Lab 08/02/17 1759  LIPASE 17   Recent Labs  Lab 08/02/17 1759  08/03/17 0033 08/04/17 0635 08/05/17 0430  WBC 17.9* 16.0* 13.7* 15.0*  NEUTROABS 15.3* 12.6*  --   --   HGB 9.6* 9.1* 9.9* 9.4*  HCT 29.5* 27.9* 29.9* 29.9*  MCV 87.8 88.9 88.2 88.5  PLT 222 195 202 220    Recent Labs  Lab 08/04/17 0748 08/04/17 1234 08/04/17 1647 08/04/17 2134 08/05/17 0727  GLUCAP 160* 130* 105* 151* 309*    Recent Labs  Lab 08/03/17 0033  IRON 9*  TIBC NOT CALCULATED  FERRITIN 1,275*   Results for LONEY, DOMINGO (MRN [id number]) as of 08/05/2017 12:13  08/03/2017 13:56  Color, Fluid COLORLESS (A)  WBC, Fluid 5,620 (H)  Lymphs, Fluid NONE SEEN  Eos, Fluid 1  Appearance, Fluid HAZY (A)  Neutrophil Count, Fluid 97 (H)  Monocyte-Macrophage-Serous Fluid 2 (L)   Gram stain PD fluid GPC  Medications: . sodium chloride    . dialysis solution 2.5% low-MG/low-CA     . amLODipine  5 mg Oral Daily  .  calcitRIOL  0.5 mcg Oral Daily  . calcium acetate  1,334 mg Oral TID WC  . carvedilol  6.25 mg Oral BID WC  . Chlorhexidine Gluconate Cloth  6 each Topical Q0600  . gentamicin cream  1 application Topical Daily  . insulin aspart  0-5 Units Subcutaneous QHS  . insulin aspart  0-9 Units Subcutaneous TID WC  . mupirocin ointment  1 application Nasal BID  . sodium chloride flush  3 mL Intravenous Q12H  . sucroferric oxyhydroxide  500 mg Oral TID WC   Dialysis Orders:  [facility] 6 exchanges/day, fill volume 2.45 L, last fill 2.45, no daytime exchanges.  2.5 Ca 0.5 mg solution  Assessment/Plan:  1. Abdominal pain - despite "clear" PD fluid, elevated call count, GPC on grams stain. Vanco loaded yesterday. For level tomorrow.  Continue IV in house, transition to IP at discharge. I have already discussed with the home training unit who will assist w/IP vanco continuation at home, and f/u cell counts 2. "Knot" on SP area  - evaluated by gen surg thought to be reactive lymph node 3. ESRD -  [facility]. Fibrin plug resolved post activase of PD catheter. Usual PD  tonight 4. Hypokalemia - stable post oral supplementation 5. Anemia - had Mircera 150 mcg on 07/28/17. Iron extremely low. Will need IV Fe once off ATB's. 6. Secondary HPT. Hypocalcemia. On Ca acetate outpt. Resumed. Continue current calcitriol. Takes Velphoro AND ca acetate for phos  Jamal Maes, MD [facility] [phone] Pager 08/05/2017, 12:18 PM

## 2017-08-05 NOTE — Progress Notes (Signed)
Inpatient Diabetes Program Recommendations  AACE/ADA: New Consensus Statement on Inpatient Glycemic Control (2015)  Target Ranges:  Prepandial:   less than 140 mg/dL      Peak postprandial:   less than 180 mg/dL (1-2 hours)      Critically ill patients:  140 - 180 mg/dL   Results for Bryan Wilkerson, Bryan Wilkerson (MRN [id number]) as of 08/05/2017 14:26  Ref. Range 08/05/2017 07:27 08/05/2017 12:06  Glucose-Capillary Latest Ref Range: 65 - 99 mg/dL 309 (H) 264 (H)    Admit with: Abd Pain, Fever, Sepsis  History: DM, ESRD  Home DM Meds: 70/30 Insulin 4 units daily  Current Insulin Orders: Novolog Sensitive Correction Scale/ SSI (0-9 units) TID AC + HS      MD- Please consider the following in-hospital insulin adjustments:  1. Start low dose basal insulin- Recommend Levemir 8 units daily (0.1 units/kg dosing)  2. Increase Novolog SSI to Moderate scale (0-15 units) TID AC + HS      --Will follow patient during hospitalization--  Wyn Quaker RN, MSN, CDE Diabetes Coordinator Inpatient Glycemic Control Team Team Pager: [phone] (8a-5p)

## 2017-08-05 NOTE — Progress Notes (Signed)
Hospitalist progress note   Bryan Wilkerson  UMP:[id number] DOB: 1966-04-08 DOA: 08/02/2017 PCP: [name], MD   Specialists:   Brief Narrative:  52 y/o ?  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 Seizure disorder on depakote, CHF NYHA cls iii-last EF 65% 09/2015 admit 1/30 2/2 to 1 wk h/o abd pain and fever--had a tender nodule in belly below umbilicus WBC on admit 40-GQ abd pelvis? Nodule ant to rectus muscle Admitted for presumed peritonitis 2/2 vs cellutlits and developing abcess  Assessment & Plan:   Assessment:  The primary encounter diagnosis was Generalized abdominal pain. A diagnosis of Fever, unspecified fever cause was also pertinent to this visit.  Sepsis 2/2 peritonitis- Gram + cocci in dialysate--narrow to vanc 08/05/17 and d/c levaquin flagyll--appreciate renal input. abx in dialysate as per nephro/pharm,D if able--Gen surg saw patient 1/31 re: knot in abdomen, felt reactive lymphadenopathy, gen surg available as prn HTn-cont amlodipine 5, coreg 6.25, well controlled DM ty 1-on NPH 4 u at home--holding, cont SSI-cbg 269-309--if tredns worsen add Lantus vs NPH in 24 h Chr Disat Hf Ef 65% HYHA iii-stable currently ESRD on PD-defer to renal planning-for AoRD cont Velphoro 500 tid Sz disorder-not currently on AED? Hypokalemia-K2.8 -->3.9 with supplementation and this was stopped-labs am Psoriasis-follow up with derm as OP Diarrhea-seems food dependant-Has been subacute/chronic-will need OP GI follow up    DVT prophylaxis: lovenox   Code Status:   full   Family Communication:    None   Disposition Plan: inpatient   Consultants:   renal  Procedures:   ct  Antimicrobials:   Vanc/leveaquin/flagyll   Subjective:  Awake alert appears much more comfortable and in nad no fever no chills no cp Wife states dialysate fluid looked much clearer this am Not needing any pain meds  Objective: Vitals:   08/04/17 0700 08/04/17 1315 08/04/17 1905  08/05/17 0440  BP:  140/81 (!) 105/49 117/66  Pulse:  72 80 73  Resp:  18 20 18   Temp:  98.8 F (37.1 C) 99.3 F (37.4 C) 98.6 F (37 C)  TempSrc:  Oral Oral Oral  SpO2:  98% 94% 92%  Weight: 94.4 kg (208 lb 1.8 oz)  88.2 kg (194 lb 7.1 oz)   Height:        Intake/Output Summary (Last 24 hours) at 08/05/2017 1033 Last data filed at 08/05/2017 1018 Gross per 24 hour  Intake 15389 ml  Output 15922 ml  Net -533 ml   Filed Weights   08/03/17 1945 08/04/17 0700 08/04/17 1905  Weight: 91.2 kg (201 lb 1 oz) 94.4 kg (208 lb 1.8 oz) 88.2 kg (194 lb 7.1 oz)    Examination:  Pleasant in less pain cta b no added sound And softt still small knot  No le edema s1 s 2no m/r/g   Data Reviewed: I have personally reviewed following labs and imaging studies  CBC: Recent Labs  Lab 08/02/17 1759 08/03/17 0033 08/04/17 0635 08/05/17 0430  WBC 17.9* 16.0* 13.7* 15.0*  NEUTROABS 15.3* 12.6*  --   --   HGB 9.6* 9.1* 9.9* 9.4*  HCT 29.5* 27.9* 29.9* 29.9*  MCV 87.8 88.9 88.2 88.5  PLT 222 195 202 676   Basic Metabolic Panel: Recent Labs  Lab 08/02/17 1759 08/03/17 0033 08/04/17 0635 08/05/17 0430  NA 131* 132* 134* 132*  K 3.0* 2.8* 3.8 3.9  CL 95* 96* 98* 98*  CO2 26 25 24 25   GLUCOSE 186* 213*  184* 265*  BUN 34* 32* 39* 42*  CREATININE 12.41* 12.13* 13.28* 14.20*  CALCIUM 7.2* 6.8* 7.2* 7.5*  MG  --  3.4*  --   --   PHOS  --   --  4.9* 4.8*   GFR: Estimated Creatinine Clearance: 7.4 mL/min (A) (by C-G formula based on SCr of 14.2 mg/dL (H)). Liver Function Tests: Recent Labs  Lab 08/02/17 1759 08/04/17 0635 08/05/17 0430  AST 24  --   --   ALT 32  --   --   ALKPHOS 148*  --   --   BILITOT 0.3  --   --   PROT 6.8  --   --   ALBUMIN 1.7* 1.5* 1.4*   Recent Labs  Lab 08/02/17 1759  LIPASE 17   No results for input(s): AMMONIA in the last 168 hours. Coagulation Profile: Recent Labs  Lab 08/03/17 0033  INR 1.46   Cardiac Enzymes: No results for input(s):  CKTOTAL, CKMB, CKMBINDEX, TROPONINI in the last 168 hours. CBG: Recent Labs  Lab 08/04/17 0748 08/04/17 1234 08/04/17 1647 08/04/17 2134 08/05/17 0727  GLUCAP 160* 130* 105* 151* 309*   Urine analysis:    Component Value Date/Time   COLORURINE YELLOW 12/30/2012 [place] 12/30/2012 0241   LABSPEC 1.010 12/30/2012 0241   PHURINE 6.5 12/30/2012 0241   GLUCOSEU NEGATIVE 12/30/2012 0241   HGBUR SMALL (A) 12/30/2012 0241   BILIRUBINUR NEGATIVE 12/30/2012 0241   KETONESUR NEGATIVE 12/30/2012 0241   PROTEINUR 100 (A) 12/30/2012 0241   UROBILINOGEN 1.0 12/30/2012 0241   NITRITE NEGATIVE 12/30/2012 0241   LEUKOCYTESUR NEGATIVE 12/30/2012 0241     Radiology Studies: Reviewed images personally in health database    Scheduled Meds: . amLODipine  5 mg Oral Daily  . calcitRIOL  0.5 mcg Oral Daily  . calcium acetate  1,334 mg Oral TID WC  . carvedilol  6.25 mg Oral BID WC  . Chlorhexidine Gluconate Cloth  6 each Topical Q0600  . gentamicin cream  1 application Topical Daily  . insulin aspart  0-5 Units Subcutaneous QHS  . insulin aspart  0-9 Units Subcutaneous TID WC  . mupirocin ointment  1 application Nasal BID  . sodium chloride flush  3 mL Intravenous Q12H  . sucroferric oxyhydroxide  500 mg Oral TID WC   Continuous Infusions: . sodium chloride    . dialysis solution 2.5% low-MG/low-CA       LOS: 3 days    Time spent: [place], MD [facility]   If 7PM-7AM, please contact night-coverage [email or web address] Password [facility] 08/05/2017, 10:33 AM

## 2017-08-06 DIAGNOSIS — D509 Iron deficiency anemia, unspecified: Secondary | ICD-10-CM | POA: Diagnosis not present

## 2017-08-06 DIAGNOSIS — K658 Other peritonitis: Secondary | ICD-10-CM | POA: Diagnosis not present

## 2017-08-06 DIAGNOSIS — K650 Generalized (acute) peritonitis: Secondary | ICD-10-CM | POA: Diagnosis not present

## 2017-08-06 DIAGNOSIS — R17 Unspecified jaundice: Secondary | ICD-10-CM | POA: Diagnosis not present

## 2017-08-06 DIAGNOSIS — N186 End stage renal disease: Secondary | ICD-10-CM | POA: Diagnosis not present

## 2017-08-06 DIAGNOSIS — E440 Moderate protein-calorie malnutrition: Secondary | ICD-10-CM | POA: Diagnosis not present

## 2017-08-06 LAB — CBC WITH DIFFERENTIAL/PLATELET
Basophils Absolute: 0 10*3/uL (ref 0.0–0.1)
Basophils Relative: 0 %
EOS PCT: 2 %
Eosinophils Absolute: 0.2 10*3/uL (ref 0.0–0.7)
HCT: 31.7 % — ABNORMAL LOW (ref 39.0–52.0)
Hemoglobin: 10.2 g/dL — ABNORMAL LOW (ref 13.0–17.0)
LYMPHS ABS: 1.4 10*3/uL (ref 0.7–4.0)
LYMPHS PCT: 12 %
MCH: 28.6 pg (ref 26.0–34.0)
MCHC: 32.2 g/dL (ref 30.0–36.0)
MCV: 88.8 fL (ref 78.0–100.0)
MONO ABS: 0.9 10*3/uL (ref 0.1–1.0)
MONOS PCT: 8 %
Neutro Abs: 9 10*3/uL — ABNORMAL HIGH (ref 1.7–7.7)
Neutrophils Relative %: 78 %
PLATELETS: 221 10*3/uL (ref 150–400)
RBC: 3.57 MIL/uL — ABNORMAL LOW (ref 4.22–5.81)
RDW: 15.9 % — AB (ref 11.5–15.5)
WBC: 11.6 10*3/uL — ABNORMAL HIGH (ref 4.0–10.5)

## 2017-08-06 LAB — RENAL FUNCTION PANEL
ALBUMIN: 1.5 g/dL — AB (ref 3.5–5.0)
Anion gap: 11 (ref 5–15)
BUN: 40 mg/dL — AB (ref 6–20)
CO2: 24 mmol/L (ref 22–32)
Calcium: 7.8 mg/dL — ABNORMAL LOW (ref 8.9–10.3)
Chloride: 97 mmol/L — ABNORMAL LOW (ref 101–111)
Creatinine, Ser: 14.44 mg/dL — ABNORMAL HIGH (ref 0.61–1.24)
GFR calc Af Amer: 4 mL/min — ABNORMAL LOW (ref >60)
GFR calc non Af Amer: 3 mL/min — ABNORMAL LOW (ref >60)
GLUCOSE: 220 mg/dL — AB (ref 65–99)
POTASSIUM: 3.7 mmol/L (ref 3.5–5.1)
Phosphorus: 4.7 mg/dL — ABNORMAL HIGH (ref 2.5–4.6)
Sodium: 132 mmol/L — ABNORMAL LOW (ref 135–145)

## 2017-08-06 LAB — VANCOMYCIN, RANDOM: Vancomycin Rm: 18

## 2017-08-06 LAB — GLUCOSE, CAPILLARY
GLUCOSE-CAPILLARY: 324 mg/dL — AB (ref 65–99)
Glucose-Capillary: 224 mg/dL — ABNORMAL HIGH (ref 65–99)
Glucose-Capillary: 182 mg/dL — ABNORMAL HIGH (ref 65–99)
Glucose-Capillary: 204 mg/dL — ABNORMAL HIGH (ref 65–99)

## 2017-08-06 MED ORDER — VANCOMYCIN HCL 10 G IV SOLR
1250.0000 mg | Freq: Once | INTRAVENOUS | Status: AC
  Administered 2017-08-06: 1250 mg via INTRAVENOUS
  Filled 2017-08-06: qty 1250

## 2017-08-06 MED ORDER — ALTEPLASE 2 MG IJ SOLR
2.0000 mg | Freq: Once | INTRAMUSCULAR | Status: AC
  Administered 2017-08-06: 2 mg

## 2017-08-06 MED ORDER — ALTEPLASE 2 MG IJ SOLR
INTRAMUSCULAR | Status: AC
  Administered 2017-08-06: 2 mg
  Filled 2017-08-06: qty 2

## 2017-08-06 NOTE — Progress Notes (Signed)
HD RN flushed PD cath with 60 mL, NS.  Unable to draw back post flush.  Catheter connected to manual drain bag, no output noted.  Plan for instillation of alteplase, 1 HR, if catheter cleared of fibrin will initiate new tx c heparinized dialysate.  Will continue to monitor.

## 2017-08-06 NOTE — Progress Notes (Signed)
Hospitalist progress note   Bryan Wilkerson  OXB:[id number] DOB: 1966-05-29 DOA: 08/02/2017 PCP: [name], MD   Specialists:   Brief Narrative:  52 y/o ?  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 Seizure disorder on depakote, CHF NYHA cls iii-last EF 65% 09/2015 admit 1/30 2/2 to 1 wk h/o abd pain and fever--had a tender nodule in belly below umbilicus WBC on admit 68-TM abd pelvis? Nodule ant to rectus muscle Admitted for presumed peritonitis 2/2 vs cellutlits and developing abcess  Assessment & Plan:   Assessment:  The primary encounter diagnosis was Generalized abdominal pain. A diagnosis of Fever, unspecified fever cause was also pertinent to this visit.  Sepsis 2/2 peritonitis- Gram + cocci in dialysate--narrow to vanc 08/05/17 and d/c levaquin flagyll--await cult data may be able to do IP Rx? as per nephro/pharm D if able--knot in abdomen, felt reactive lymphadenopathy, gen surg available as prn Htn-cont amlodipine 5, coreg 6.25, well controlled DM ty 1-on NPH 4 u at home--holding, cont SSI-cbg 182-224 Chr Disat Hf Ef 65% HYHA iii-stable currently ESRD on PD-defer to renal planning-for AoRD cont Velphoro 500 tid Sz disorder-not currently on AED? Hypokalemia-K2.8 -->3.9 with supplementation and this was stopped-labs am Psoriasis-follow up with derm as OP Diarrhea-seems food dependant-Has been subacute/chronic-will need OP GI follow up-0-no further work-up    DVT prophylaxis: lovenox   Code Status:   full   Family Communication:    None   Disposition Plan: inpatient   Consultants:   renal  Procedures:   ct  Antimicrobials:   Vanc/leveaquin/flagyll   Subjective:  uncomfortable again Fluid not draining No fever no chills no cp No cough  Objective: Vitals:   08/05/17 2143 08/05/17 2156 08/06/17 0550 08/06/17 0846  BP: 132/70 (!) 116/59 130/76 124/74  Pulse: 71 86 72 70  Resp: 18 17 20 18   Temp: 98.1 F (36.7 C) (!) 97.5 F (36.4  C) 98.2 F (36.8 C) 98.4 F (36.9 C)  TempSrc: Oral Axillary Oral Oral  SpO2:  100% 98% 99%  Weight: 95.3 kg (210 lb 1.6 oz)     Height:        Intake/Output Summary (Last 24 hours) at 08/06/2017 1458 Last data filed at 08/06/2017 1133 Gross per 24 hour  Intake 1759 ml  Output 1809 ml  Net -50 ml   Filed Weights   08/04/17 0700 08/04/17 1905 08/05/17 2143  Weight: 94.4 kg (208 lb 1.8 oz) 88.2 kg (194 lb 7.1 oz) 95.3 kg (210 lb 1.6 oz)    Examination:  Awake alert cta b no added sound And soft still small knot  No le edema s1 s 2no m/r/g   Data Reviewed: I have personally reviewed following labs and imaging studies  CBC: Recent Labs  Lab 08/02/17 1759 08/03/17 0033 08/04/17 0635 08/05/17 0430 08/06/17 0630  WBC 17.9* 16.0* 13.7* 15.0* 11.6*  NEUTROABS 15.3* 12.6*  --   --  9.0*  HGB 9.6* 9.1* 9.9* 9.4* 10.2*  HCT 29.5* 27.9* 29.9* 29.9* 31.7*  MCV 87.8 88.9 88.2 88.5 88.8  PLT 222 195 202 220 196   Basic Metabolic Panel: Recent Labs  Lab 08/02/17 1759 08/03/17 0033 08/04/17 0635 08/05/17 0430 08/06/17 0630  NA 131* 132* 134* 132* 132*  K 3.0* 2.8* 3.8 3.9 3.7  CL 95* 96* 98* 98* 97*  CO2 26 25 24 25 24   GLUCOSE 186* 213* 184* 265* 220*  BUN 34* 32* 39* 42* 40*  CREATININE 12.41*  12.13* 13.28* 14.20* 14.44*  CALCIUM 7.2* 6.8* 7.2* 7.5* 7.8*  MG  --  3.4*  --   --   --   PHOS  --   --  4.9* 4.8* 4.7*   GFR: Estimated Creatinine Clearance: 7.3 mL/min (A) (by C-G formula based on SCr of 14.44 mg/dL (H)). Liver Function Tests: Recent Labs  Lab 08/02/17 1759 08/04/17 0635 08/05/17 0430 08/06/17 0630  AST 24  --   --   --   ALT 32  --   --   --   ALKPHOS 148*  --   --   --   BILITOT 0.3  --   --   --   PROT 6.8  --   --   --   ALBUMIN 1.7* 1.5* 1.4* 1.5*   Recent Labs  Lab 08/02/17 1759  LIPASE 17   No results for input(s): AMMONIA in the last 168 hours. Coagulation Profile: Recent Labs  Lab 08/03/17 0033  INR 1.46   Cardiac  Enzymes: No results for input(s): CKTOTAL, CKMB, CKMBINDEX, TROPONINI in the last 168 hours. CBG: Recent Labs  Lab 08/05/17 1717 08/05/17 1811 08/05/17 2153 08/06/17 0729 08/06/17 1144  GLUCAP 35* 128* 167* 224* 182*   Urine analysis:    Component Value Date/Time   COLORURINE YELLOW 12/30/2012 [place] 12/30/2012 0241   LABSPEC 1.010 12/30/2012 0241   PHURINE 6.5 12/30/2012 0241   GLUCOSEU NEGATIVE 12/30/2012 0241   HGBUR SMALL (A) 12/30/2012 0241   BILIRUBINUR NEGATIVE 12/30/2012 0241   KETONESUR NEGATIVE 12/30/2012 0241   PROTEINUR 100 (A) 12/30/2012 0241   UROBILINOGEN 1.0 12/30/2012 0241   NITRITE NEGATIVE 12/30/2012 0241   LEUKOCYTESUR NEGATIVE 12/30/2012 0241     Radiology Studies: Reviewed images personally in health database    Scheduled Meds: . amLODipine  5 mg Oral Daily  . calcitRIOL  0.5 mcg Oral Daily  . calcium acetate  1,334 mg Oral TID WC  . carvedilol  6.25 mg Oral BID WC  . Chlorhexidine Gluconate Cloth  6 each Topical Q0600  . gentamicin cream  1 application Topical Daily  . insulin aspart  0-5 Units Subcutaneous QHS  . insulin aspart  0-9 Units Subcutaneous TID WC  . mupirocin ointment  1 application Nasal BID  . sodium chloride flush  3 mL Intravenous Q12H  . sucroferric oxyhydroxide  500 mg Oral TID WC   Continuous Infusions: . sodium chloride    . dialysis solution 2.5% low-MG/low-CA    . vancomycin       LOS: 4 days    Time spent: [place], MD [name]   If 7PM-7AM, please contact night-coverage [email or web address] Password [facility] 08/06/2017, 2:58 PM

## 2017-08-06 NOTE — Progress Notes (Signed)
Called to inform Hemo Dept.  Patient not draining  3rd  Cycle and small specks fibrin seen in catheter.Instructed patient to leave cath connected and Hemo will come reassess the machine and treatment cycles. No heparin was used in current bags per patient.

## 2017-08-06 NOTE — Progress Notes (Signed)
[facility] Progress Note   Subjective:    C/os abdominal discomfort.  PD fluid would not drain again last night, no heparin was in the bags, per patient machine continued to fill.  Unsure of exact amount of fluid in his abdomen.  Power flush unsuccessful.   Objective Vitals:   08/05/17 2143 08/05/17 2156 08/06/17 0550 08/06/17 0846  BP: 132/70 (!) 116/59 130/76 124/74  Pulse: 71 86 72 70  Resp: 18 17 20 18   Temp: 98.1 F (36.7 C) (!) 97.5 F (36.4 C) 98.2 F (36.8 C) 98.4 F (36.9 C)  TempSrc: Oral Axillary Oral Oral  SpO2:  100% 98% 99%  Weight: 95.3 kg (210 lb 1.6 oz)     Height:       Physical Exam General:NAD, sitting in bed in obvious discomfort Heart:RRR, no m/r/g Lungs:CTAB, BS diminished, nml WOB Abdomen:hard, distended & tender d/t PD fluid. Extremities: no LE edema Dialysis Access: PD cath LLQ, dressed, LU AVF +bruit   Filed Weights   08/04/17 0700 08/04/17 1905 08/05/17 2143  Weight: 94.4 kg (208 lb 1.8 oz) 88.2 kg (194 lb 7.1 oz) 95.3 kg (210 lb 1.6 oz)    Intake/Output Summary (Last 24 hours) at 08/06/2017 0929 Last data filed at 08/06/2017 0843 Gross per 24 hour  Intake 16249 ml  Output 15922 ml  Net 327 ml     Recent Labs  Lab 08/04/17 0635 08/05/17 0430 08/06/17 0630  NA 134* 132* 132*  K 3.8 3.9 3.7  CL 98* 98* 97*  CO2 24 25 24   GLUCOSE 184* 265* 220*  BUN 39* 42* 40*  CREATININE 13.28* 14.20* 14.44*  CALCIUM 7.2* 7.5* 7.8*  PHOS 4.9* 4.8* 4.7*    Recent Labs  Lab 08/02/17 1759 08/04/17 0635 08/05/17 0430 08/06/17 0630  AST 24  --   --   --   ALT 32  --   --   --   ALKPHOS 148*  --   --   --   BILITOT 0.3  --   --   --   PROT 6.8  --   --   --   ALBUMIN 1.7* 1.5* 1.4* 1.5*   Recent Labs  Lab 08/02/17 1759  LIPASE 17    Recent Labs  Lab 08/02/17 1759 08/03/17 0033 08/04/17 0635 08/05/17 0430 08/06/17 0630  WBC 17.9* 16.0* 13.7* 15.0* 11.6*  NEUTROABS 15.3* 12.6*  --   --  9.0*  HGB 9.6* 9.1* 9.9*  9.4* 10.2*  HCT 29.5* 27.9* 29.9* 29.9* 31.7*  MCV 87.8 88.9 88.2 88.5 88.8  PLT 222 195 202 220 221   Blood Culture    Component Value Date/Time   SDES PERITONEAL 08/04/2017 1520   SDES PERITONEAL 08/04/2017 1520   SPECREQUEST  08/04/2017 1520    BOTTLES DRAWN AEROBIC AND ANAEROBIC PERITONEAL WASHINGS   SPECREQUEST PERITONEAL WASHINGS 08/04/2017 1520   CULT  08/04/2017 1520    NO GROWTH < 24 HOURS Performed at [facility], [address], [place], [place] [address]    REPTSTATUS PENDING 08/04/2017 1520   REPTSTATUS 08/04/2017 FINAL 08/04/2017 1520     Recent Labs  Lab 08/05/17 1651 08/05/17 1717 08/05/17 1811 08/05/17 2153 08/06/17 0729  GLUCAP 49* 35* 128* 167* 224*    Medications: . sodium chloride    . dialysis solution 2.5% low-MG/low-CA     . amLODipine  5 mg Oral Daily  . calcitRIOL  0.5 mcg Oral Daily  . calcium acetate  1,334  mg Oral TID WC  . carvedilol  6.25 mg Oral BID WC  . Chlorhexidine Gluconate Cloth  6 each Topical Q0600  . gentamicin cream  1 application Topical Daily  . insulin aspart  0-5 Units Subcutaneous QHS  . insulin aspart  0-9 Units Subcutaneous TID WC  . mupirocin ointment  1 application Nasal BID  . sodium chloride flush  3 mL Intravenous Q12H  . sucroferric oxyhydroxide  500 mg Oral TID WC    Dialysis Orders: CCPD 6 exchanges/day, fill volume 2.45 L, last fill 2.45, no daytime exchanges.  2.5 Ca 0.5 mg solution   Assessment/Plan: 1.  Abdominal pain - Clear PD fluid, but elevated cell count, +GPC on gram stain, PD fluid and blood cultures negative.  Vanco loaded 1/30. Vanc trough 18, at goal. Continue IV in house, transition to IP at d/c. Has been discussed with the home training unit who will assist w/IP vanco continuation at home, and f/u cell counts 2. Knot on SP area - thought to be reactive lymph node by general surgery 3. ESRD - PD, issues with drainage, only 2 of 6 cycles completed. Orders written for tpa of PD cath  today & restart treatment this AM. Heparin needs to be in bags. 4. Anemia of CKD- Hgb 10.2, 150 mcg mircera on 1/24. Iron extremely low, will need IV Fe once off ATB's. 5. Secondary hyperparathyroidism - Cor Ca & P in goal.  Continue VDRA, binders.  6. HTN/volume - BP controlled. No edema.  7. Nutrition - Low albumin 1.5. Renal diet w/protein supplements. 8. DM - on insulin, per primary  Bryan Mow, PA-C [facility] Pager: [phone] 08/06/2017,9:29 AM  LOS: 4 days    I have seen and examined this patient and agree with plan and assessment in the above note with renal recommendations/intervention highlighted. Recurrent issue with PD cath drainage (repeating activase) and machine fill when not draining per pt. Resume PD today post activase. Peritonitis wise, WBC improved on vanco (+ gm stain, neg cultures so far).   Bryan Wilkerson B,MD 08/06/2017 11:38 AM

## 2017-08-07 DIAGNOSIS — D509 Iron deficiency anemia, unspecified: Secondary | ICD-10-CM | POA: Diagnosis not present

## 2017-08-07 DIAGNOSIS — N186 End stage renal disease: Secondary | ICD-10-CM | POA: Diagnosis not present

## 2017-08-07 DIAGNOSIS — K650 Generalized (acute) peritonitis: Secondary | ICD-10-CM | POA: Diagnosis not present

## 2017-08-07 DIAGNOSIS — K658 Other peritonitis: Secondary | ICD-10-CM | POA: Diagnosis not present

## 2017-08-07 DIAGNOSIS — E440 Moderate protein-calorie malnutrition: Secondary | ICD-10-CM | POA: Diagnosis not present

## 2017-08-07 DIAGNOSIS — R17 Unspecified jaundice: Secondary | ICD-10-CM | POA: Diagnosis not present

## 2017-08-07 LAB — RENAL FUNCTION PANEL
ALBUMIN: 1.4 g/dL — AB (ref 3.5–5.0)
ANION GAP: 12 (ref 5–15)
BUN: 37 mg/dL — AB (ref 6–20)
CO2: 26 mmol/L (ref 22–32)
Calcium: 8 mg/dL — ABNORMAL LOW (ref 8.9–10.3)
Chloride: 94 mmol/L — ABNORMAL LOW (ref 101–111)
Creatinine, Ser: 14.54 mg/dL — ABNORMAL HIGH (ref 0.61–1.24)
GFR, EST AFRICAN AMERICAN: 4 mL/min — AB (ref >60)
GFR, EST NON AFRICAN AMERICAN: 3 mL/min — AB (ref >60)
Glucose, Bld: 260 mg/dL — ABNORMAL HIGH (ref 65–99)
POTASSIUM: 3.5 mmol/L (ref 3.5–5.1)
Phosphorus: 4.9 mg/dL — ABNORMAL HIGH (ref 2.5–4.6)
Sodium: 132 mmol/L — ABNORMAL LOW (ref 135–145)

## 2017-08-07 LAB — CBC
HEMATOCRIT: 33.6 % — AB (ref 39.0–52.0)
HEMOGLOBIN: 10.6 g/dL — AB (ref 13.0–17.0)
MCH: 27.9 pg (ref 26.0–34.0)
MCHC: 31.5 g/dL (ref 30.0–36.0)
MCV: 88.4 fL (ref 78.0–100.0)
Platelets: 220 10*3/uL (ref 150–400)
RBC: 3.8 MIL/uL — ABNORMAL LOW (ref 4.22–5.81)
RDW: 16 % — AB (ref 11.5–15.5)
WBC: 9 10*3/uL (ref 4.0–10.5)

## 2017-08-07 LAB — GLUCOSE, CAPILLARY
GLUCOSE-CAPILLARY: 129 mg/dL — AB (ref 65–99)
Glucose-Capillary: 227 mg/dL — ABNORMAL HIGH (ref 65–99)
Glucose-Capillary: 240 mg/dL — ABNORMAL HIGH (ref 65–99)
Glucose-Capillary: 164 mg/dL — ABNORMAL HIGH (ref 65–99)

## 2017-08-07 MED ORDER — INSULIN GLARGINE 100 UNIT/ML ~~LOC~~ SOLN
4.0000 [IU] | Freq: Every day | SUBCUTANEOUS | Status: DC
  Filled 2017-08-07: qty 0.04

## 2017-08-07 NOTE — Progress Notes (Signed)
[facility] Progress Note   Subjective:    Feeling "great" today, "Better than I have felt in a while." PD went well overnight, no complications.  Denies SOB, n/v/d, abdominal pain, edema.  Required another dose of activase yesterday d/t fibrin occlusion of PD cath  Objective Vitals:   08/06/17 0846 08/06/17 1655 08/06/17 2112 08/07/17 0531  BP: 124/74 122/74 (!) 143/79 (!) 147/75  Pulse: 70 70 70 73  Resp: 18 18 17 17   Temp: 98.4 F (36.9 C) 97.8 F (36.6 C) 98.2 F (36.8 C) 98.2 F (36.8 C)  TempSrc: Oral Oral Oral Oral  SpO2: 99% 98% 98% 96%  Weight:   95.6 kg (210 lb 12.2 oz)   Height:       Physical Exam General:NAD, well appearing male sitting up in bed Heart:RRR, no m/r/g Lungs:CTAB, BS diminished, nml WOB Abdomen:soft, NTND, +BS Extremities:no LE edema Dialysis Access: PD cath dressed, LU AVF +b/t   Filed Weights   08/04/17 1905 08/05/17 2143 08/06/17 2112  Weight: 88.2 kg (194 lb 7.1 oz) 95.3 kg (210 lb 1.6 oz) 95.6 kg (210 lb 12.2 oz)    Intake/Output Summary (Last 24 hours) at 08/07/2017 1150 Last data filed at 08/07/2017 0920 Gross per 24 hour  Intake 15662 ml  Output 15475 ml  Net 187 ml     Recent Labs  Lab 08/05/17 0430 08/06/17 0630 08/07/17 0934  NA 132* 132* 132*  K 3.9 3.7 3.5  CL 98* 97* 94*  CO2 25 24 26   GLUCOSE 265* 220* 260*  BUN 42* 40* 37*  CREATININE 14.20* 14.44* 14.54*  CALCIUM 7.5* 7.8* 8.0*  PHOS 4.8* 4.7* 4.9*    Recent Labs  Lab 08/02/17 1759  08/05/17 0430 08/06/17 0630 08/07/17 0934  AST 24  --   --   --   --   ALT 32  --   --   --   --   ALKPHOS 148*  --   --   --   --   BILITOT 0.3  --   --   --   --   PROT 6.8  --   --   --   --   ALBUMIN 1.7*   < > 1.4* 1.5* 1.4*   < > = values in this interval not displayed.   Recent Labs  Lab 08/02/17 1759  LIPASE 17    Recent Labs  Lab 08/02/17 1759 08/03/17 0033 08/04/17 0635 08/05/17 0430 08/06/17 0630 08/07/17 0934  WBC 17.9* 16.0*  13.7* 15.0* 11.6* 9.0  NEUTROABS 15.3* 12.6*  --   --  9.0*  --   HGB 9.6* 9.1* 9.9* 9.4* 10.2* 10.6*  HCT 29.5* 27.9* 29.9* 29.9* 31.7* 33.6*  MCV 87.8 88.9 88.2 88.5 88.8 88.4  PLT 222 195 202 220 221 220       Component Value Date/Time   SDES PERITONEAL 08/04/2017 1520   SDES PERITONEAL 08/04/2017 1520   SPECREQUEST  08/04/2017 1520    BOTTLES DRAWN AEROBIC AND ANAEROBIC PERITONEAL WASHINGS   SPECREQUEST PERITONEAL WASHINGS 08/04/2017 1520   CULT  08/04/2017 1520    NO GROWTH 2 DAYS Performed at [facility], [place] [address] [place], [place] [address]    REPTSTATUS PENDING 08/04/2017 1520   REPTSTATUS 08/04/2017 FINAL 08/04/2017 1520    Recent Labs  Lab 08/06/17 0729 08/06/17 1144 08/06/17 1653 08/06/17 2110 08/07/17 0751  GLUCAP 224* 182* 204* 324* 227*    Medications: . sodium chloride    .  dialysis solution 2.5% low-MG/low-CA     . amLODipine  5 mg Oral Daily  . calcitRIOL  0.5 mcg Oral Daily  . calcium acetate  1,334 mg Oral TID WC  . carvedilol  6.25 mg Oral BID WC  . Chlorhexidine Gluconate Cloth  6 each Topical Q0600  . gentamicin cream  1 application Topical Daily  . insulin aspart  0-5 Units Subcutaneous QHS  . insulin aspart  0-9 Units Subcutaneous TID WC  . mupirocin ointment  1 application Nasal BID  . sodium chloride flush  3 mL Intravenous Q12H  . sucroferric oxyhydroxide  500 mg Oral TID WC    Dialysis Orders: CCPD 6 exchanges/day, fill volume 2.45 L, last fill 2.45, no daytime exchanges.  2.5 Ca 0.5 mg solution  Assessment/Plan:  1.  Culture negative peritonitis. Clear PD fluid, but elevated cell count, +GPC on gram stain neg culture. BC NGTD. Abdominal pain improving. WBC down. ATB's narrowed to vanco, 2 gm load 1/30. Vanc trough 18, at goal. Continue IV in house, transition to IP at d/c. Has been discussed with the home training unit who will assist w/IP vanco continuation at home, and f/u cell counts. If still here in the AM will  need vanco level to guide redosing. 2. Knot on SP area - per general surgery likely reactive lymph node  3. ESRD - PD, repeat drainage issues requiring activase (twice this admission). PD bags need to be heparinized. Successful PD last night without complications. 4. Anemia of CKD- Hgb 10.6, 150 mcg mircera on 1/24. Iron extremely low, will need IV Fe once off ABX's. 5. Secondary hyperparathyroidism - Cor Ca & P in goal.  Continue VDRA, binders.  6. HTN/volume - BP stable. No edema. Appears euvolemic on exam. 7. Nutrition - Low albumin 1.4. Renal diet w/protein supplements. Dr. Justin Mend can decide as outpt if pt needs transition to HD for a while until nutrition improved. 8. DM - on insulin, per primary  Jen Mow, PA-C [facility] Pager: [phone] 08/07/2017,11:50 AM  LOS: 5 days    I have seen and examined this patient and agree with plan and assessment in the above note with renal recommendations/intervention highlighted. No PD problems last night after epeat activase to PD catheter. Needs repeat vanco level in the AM to guide redosing. Continuing usual PD.  Rishik Tubby B,MD 08/07/2017 12:32 PM

## 2017-08-07 NOTE — Progress Notes (Signed)
Hospitalist progress note   Bryan Wilkerson  OIN:[id number] DOB: 1965-11-07 DOA: 08/02/2017 PCP: [name], MD   Specialists:   Brief Narrative:  52 y/o ?  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 Seizure disorder on depakote, CHF NYHA cls iii-last EF 65% 09/2015 admit 1/30 2/2 to 1 wk h/o abd pain and fever--had a tender nodule in belly below umbilicus WBC on admit 70-JG abd pelvis? Nodule ant to rectus muscle Admitted for presumed peritonitis   Assessment & Plan:   Assessment:  The primary encounter diagnosis was Generalized abdominal pain. A diagnosis of Fever, unspecified fever cause was also pertinent to this visit.  Sepsis 2/2 peritonitis- Gram + cocci in dialysate--narrow to vanc 08/05/17 and d/c levaquin flagyll--vancomycin to dose and then maybe IP RX as per nephro/pharm D--white count now normalized to 9 Reactive lymphadenopathy in abdomen- gen surg available as prn Htn-cont amlodipine 5, coreg 6.25, mod controlled DM ty 1-on NPH 4 u at home--holding, cont SSI-cbg 227-260-has been low this hospital stay--would not aggressively control CBG Chr Disat Hf Ef 65% HYHA iii-stable currently ESRD on PD-defer to renal planning-for AoRD cont Velphoro 500 tid Sz disorder-not currently on AED? Hypokalemia-K2.8 -->3.5--recheck periodically Psoriasis-follow up with derm as OP Diarrhea-seems food dependant-Has been subacute/chronic-will need OP GI follow up-no further work-up    DVT prophylaxis: lovenox   Code Status:   full   Family Communication:    None   Disposition Plan: inpatient   Consultants:   renal  Procedures:   ct  Antimicrobials:   Vanc/leveaquin/flagyll   Subjective:  Awake alert in no pain and has no discomfort I abd No cp No sob Eating ok   Objective: Vitals:   08/06/17 0846 08/06/17 1655 08/06/17 2112 08/07/17 0531  BP: 124/74 122/74 (!) 143/79 (!) 147/75  Pulse: 70 70 70 73  Resp: 18 18 17 17   Temp: 98.4 F (36.9 C)  97.8 F (36.6 C) 98.2 F (36.8 C) 98.2 F (36.8 C)  TempSrc: Oral Oral Oral Oral  SpO2: 99% 98% 98% 96%  Weight:   95.6 kg (210 lb 12.2 oz)   Height:        Intake/Output Summary (Last 24 hours) at 08/07/2017 1422 Last data filed at 08/07/2017 0920 Gross per 24 hour  Intake 15422 ml  Output 15475 ml  Net -53 ml   Filed Weights   08/04/17 1905 08/05/17 2143 08/06/17 2112  Weight: 88.2 kg (194 lb 7.1 oz) 95.3 kg (210 lb 1.6 oz) 95.6 kg (210 lb 12.2 oz)    Examination:  eomi ncat no pallor no ict cta b abd soft nt nd no discomfrot to deep palpation Neuro intact moves 4 limbs equally   Data Reviewed: I have personally reviewed following labs and imaging studies  CBC: Recent Labs  Lab 08/02/17 1759 08/03/17 0033 08/04/17 0635 08/05/17 0430 08/06/17 0630 08/07/17 0934  WBC 17.9* 16.0* 13.7* 15.0* 11.6* 9.0  NEUTROABS 15.3* 12.6*  --   --  9.0*  --   HGB 9.6* 9.1* 9.9* 9.4* 10.2* 10.6*  HCT 29.5* 27.9* 29.9* 29.9* 31.7* 33.6*  MCV 87.8 88.9 88.2 88.5 88.8 88.4  PLT 222 195 202 220 221 283   Basic Metabolic Panel: Recent Labs  Lab 08/03/17 0033 08/04/17 0635 08/05/17 0430 08/06/17 0630 08/07/17 0934  NA 132* 134* 132* 132* 132*  K 2.8* 3.8 3.9 3.7 3.5  CL 96* 98* 98* 97* 94*  CO2 25 24 25 24  26  GLUCOSE 213* 184* 265* 220* 260*  BUN 32* 39* 42* 40* 37*  CREATININE 12.13* 13.28* 14.20* 14.44* 14.54*  CALCIUM 6.8* 7.2* 7.5* 7.8* 8.0*  MG 3.4*  --   --   --   --   PHOS  --  4.9* 4.8* 4.7* 4.9*   GFR: Estimated Creatinine Clearance: 7.3 mL/min (A) (by C-G formula based on SCr of 14.54 mg/dL (H)). Liver Function Tests: Recent Labs  Lab 08/02/17 1759 08/04/17 0635 08/05/17 0430 08/06/17 0630 08/07/17 0934  AST 24  --   --   --   --   ALT 32  --   --   --   --   ALKPHOS 148*  --   --   --   --   BILITOT 0.3  --   --   --   --   PROT 6.8  --   --   --   --   ALBUMIN 1.7* 1.5* 1.4* 1.5* 1.4*   Recent Labs  Lab 08/02/17 1759  LIPASE 17   No results  for input(s): AMMONIA in the last 168 hours. Coagulation Profile: Recent Labs  Lab 08/03/17 0033  INR 1.46   Cardiac Enzymes: No results for input(s): CKTOTAL, CKMB, CKMBINDEX, TROPONINI in the last 168 hours. CBG: Recent Labs  Lab 08/06/17 1144 08/06/17 1653 08/06/17 2110 08/07/17 0751 08/07/17 1215  GLUCAP 182* 204* 324* 227* 240*   Urine analysis:    Component Value Date/Time   COLORURINE YELLOW 12/30/2012 0241   APPEARANCEUR CLEAR 12/30/2012 0241   LABSPEC 1.010 12/30/2012 0241   PHURINE 6.5 12/30/2012 0241   GLUCOSEU NEGATIVE 12/30/2012 0241   HGBUR SMALL (A) 12/30/2012 0241   BILIRUBINUR NEGATIVE 12/30/2012 0241   KETONESUR NEGATIVE 12/30/2012 0241   PROTEINUR 100 (A) 12/30/2012 0241   UROBILINOGEN 1.0 12/30/2012 0241   NITRITE NEGATIVE 12/30/2012 0241   LEUKOCYTESUR NEGATIVE 12/30/2012 0241     Radiology Studies: Reviewed images personally in health database    Scheduled Meds: . amLODipine  5 mg Oral Daily  . calcitRIOL  0.5 mcg Oral Daily  . calcium acetate  1,334 mg Oral TID WC  . carvedilol  6.25 mg Oral BID WC  . Chlorhexidine Gluconate Cloth  6 each Topical Q0600  . gentamicin cream  1 application Topical Daily  . insulin aspart  0-5 Units Subcutaneous QHS  . insulin aspart  0-9 Units Subcutaneous TID WC  . mupirocin ointment  1 application Nasal BID  . sodium chloride flush  3 mL Intravenous Q12H  . sucroferric oxyhydroxide  500 mg Oral TID WC   Continuous Infusions: . sodium chloride    . dialysis solution 2.5% low-MG/low-CA       LOS: 5 days    Time spent: [place], MD [facility]   If 7PM-7AM, please contact night-coverage [email or web address] Password [email or web address] 08/07/2017, 2:22 PM

## 2017-08-08 DIAGNOSIS — E440 Moderate protein-calorie malnutrition: Secondary | ICD-10-CM | POA: Diagnosis not present

## 2017-08-08 DIAGNOSIS — D509 Iron deficiency anemia, unspecified: Secondary | ICD-10-CM | POA: Diagnosis not present

## 2017-08-08 DIAGNOSIS — K658 Other peritonitis: Secondary | ICD-10-CM | POA: Diagnosis not present

## 2017-08-08 DIAGNOSIS — K650 Generalized (acute) peritonitis: Secondary | ICD-10-CM | POA: Diagnosis not present

## 2017-08-08 DIAGNOSIS — R17 Unspecified jaundice: Secondary | ICD-10-CM | POA: Diagnosis not present

## 2017-08-08 DIAGNOSIS — N186 End stage renal disease: Secondary | ICD-10-CM | POA: Diagnosis not present

## 2017-08-08 LAB — CBC WITH DIFFERENTIAL/PLATELET
Basophils Absolute: 0 10*3/uL (ref 0.0–0.1)
Basophils Relative: 0 %
EOS PCT: 3 %
Eosinophils Absolute: 0.3 10*3/uL (ref 0.0–0.7)
HCT: 32.3 % — ABNORMAL LOW (ref 39.0–52.0)
HEMOGLOBIN: 10.1 g/dL — AB (ref 13.0–17.0)
LYMPHS ABS: 1.4 10*3/uL (ref 0.7–4.0)
Lymphocytes Relative: 16 %
MCH: 27.7 pg (ref 26.0–34.0)
MCHC: 31.3 g/dL (ref 30.0–36.0)
MCV: 88.5 fL (ref 78.0–100.0)
MONOS PCT: 6 %
Monocytes Absolute: 0.5 10*3/uL (ref 0.1–1.0)
Neutro Abs: 6.2 10*3/uL (ref 1.7–7.7)
Neutrophils Relative %: 75 %
PLATELETS: 197 10*3/uL (ref 150–400)
RBC: 3.65 MIL/uL — ABNORMAL LOW (ref 4.22–5.81)
RDW: 16 % — ABNORMAL HIGH (ref 11.5–15.5)
WBC: 8.3 10*3/uL (ref 4.0–10.5)

## 2017-08-08 LAB — RENAL FUNCTION PANEL
Albumin: 1.5 g/dL — ABNORMAL LOW (ref 3.5–5.0)
Anion gap: 13 (ref 5–15)
BUN: 37 mg/dL — AB (ref 6–20)
CHLORIDE: 95 mmol/L — AB (ref 101–111)
CO2: 25 mmol/L (ref 22–32)
Calcium: 8.1 mg/dL — ABNORMAL LOW (ref 8.9–10.3)
Creatinine, Ser: 14.73 mg/dL — ABNORMAL HIGH (ref 0.61–1.24)
GFR calc Af Amer: 4 mL/min — ABNORMAL LOW (ref >60)
GFR calc non Af Amer: 3 mL/min — ABNORMAL LOW (ref >60)
Glucose, Bld: 348 mg/dL — ABNORMAL HIGH (ref 65–99)
Phosphorus: 4.6 mg/dL (ref 2.5–4.6)
Potassium: 3.6 mmol/L (ref 3.5–5.1)
Sodium: 133 mmol/L — ABNORMAL LOW (ref 135–145)

## 2017-08-08 LAB — GLUCOSE, CAPILLARY
GLUCOSE-CAPILLARY: 339 mg/dL — AB (ref 65–99)
Glucose-Capillary: 360 mg/dL — ABNORMAL HIGH (ref 65–99)
Glucose-Capillary: 303 mg/dL — ABNORMAL HIGH (ref 65–99)

## 2017-08-08 LAB — CULTURE, BLOOD (ROUTINE X 2)
CULTURE: NO GROWTH
Culture: NO GROWTH
SPECIAL REQUESTS: ADEQUATE
Special Requests: ADEQUATE

## 2017-08-08 LAB — VANCOMYCIN, RANDOM: VANCOMYCIN RM: 30

## 2017-08-08 LAB — PATHOLOGIST SMEAR REVIEW

## 2017-08-08 MED ORDER — ALTEPLASE 2 MG IJ SOLR
8.0000 mg | Freq: Once | INTRAMUSCULAR | Status: AC
  Administered 2017-08-08: 8 mg
  Filled 2017-08-08: qty 8

## 2017-08-08 NOTE — Progress Notes (Signed)
During the night, the patient complained that he was slow to drain during the drain cycle of his peritoneal dialysis.  Upon assessment, he had only drained 1810 at the 124 minute mark.  Usual drain time is 35 minutes according to patient.  The machine was not alarming at any point.  Upon assessment of the drain bags, a scant amount of fibrin was noticed in the bags.  The on-call HD nurse was consulted.  He consulted with the on call nephrologist.  Since the machine was not alarming and the patient was continuing to drain, albeit slowly, we would let the treatment continue without any intervention.  At [place], the patient was in drain 3 of 5 with no alarms.  Will continue to monitor patient.  Earleen Reaper RN-BC, [employer]

## 2017-08-08 NOTE — Discharge Summary (Signed)
Physician Discharge Summary  Bryan Wilkerson BSJ:[id number] DOB: [birth year] DOA: 08/02/2017  PCP: [name], MD  Admit date: 08/02/2017 Discharge date: 08/08/2017  Time spent: 45 minutes  Recommendations for Outpatient Follow-up:  1. Patient will need vancomycin and dialysate and nephrologist is aware of dosing and timing to give usually over 3 weeks 2. Needs renal panel in 1 week    Discharge Diagnoses:  Principal Problem:   Sepsis ([place]) Active Problems:   Essential hypertension, benign   Hypokalemia   Anemia   ESRD on dialysis ([place])   Diabetes mellitus due to underlying condition, uncontrolled, with stage 4 chronic kidney disease, with long-term current use of insulin ([facility])   Cellulitis and abscess of trunk   Discharge Condition: Improved  Diet recommendation: Renal  Filed Weights   08/06/17 2112 08/07/17 0920 08/07/17 2137  Weight: 95.6 kg (210 lb 12.2 oz) 95.3 kg (210 lb 1.6 oz) 95.8 kg (211 lb 3.2 oz)    History of present illness:  52 y/o ?  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 Seizure disorder on depakote, CHF NYHA cls iii-last EF 65% 09/2015 admit 1/30 2/2 to 1 wk h/o abd pain and fever--had a tender nodule in belly below umbilicus WBC on admit 77-LY abd pelvis? Nodule ant to rectus muscle Admitted for presumed peritonitis     Hospital Course:  Sepsis 2/2 peritonitis- Gram + cocci in dialysate--narrow to vanc on 2/1 and nephrology will see the patient in the outpatient setting and determine dialysate dosing of his vancomycin Htn-cont amlodipine 5, coreg 6.25, mod controlled DM ty 1-on NPH 4 u at home--holding, cont SSI-cbg  highly fluctuant this admission as low as the 40s but has has a 300s have resumed his long-acting insulin at home dosages on discharge Chr Disat Hf Ef 65% HYHA iii-stable currently ESRD on PD-defer to renal planning-for AoRD cont Velphoro 500 tid Sz disorder-not currently on AED? Hypokalemia-K2.8  -->3.5--recheck periodically as an outpatient Psoriasis-follow up with derm as OP Diarrhea-seems food dependant-Has been subacute/chronic-will need OP GI follow up-no further work-   DialysisConsultations:  From nephrology  Discharge Exam: Vitals:   08/08/17 0850 08/08/17 1425  BP: 102/65 (!) 142/84  Pulse: 70 75  Resp: 16 17  Temp: 98.3 F (36.8 C) (!) 97 F (36.1 C)  SpO2: 99% 98%    General: eomi ncat Cardiovascular: s1 s2 no m/r/g Respiratory: clear no adde dsound abd soft nodule smaller  Discharge Instructions   Discharge Instructions    Diet - low sodium heart healthy   Complete by:  As directed    Discharge instructions   Complete by:  As directed    The dialysis doctor will take care of treating your infection with antibiotics I will going to dialysis solution you do not need for you need 2-3 weeks of this treatment I would recommend you adhere to a diet which does not cause you to have diarrhea Please follow-up with your nephrologist as an outpatient   Increase activity slowly   Complete by:  As directed      Allergies as of 08/08/2017      Reactions   Penicillins Other (See Comments)   UNSPECIFIED REACTION FROM CHILDHOOD Has patient had a PCN reaction causing immediate rash, facial/tongue/throat swelling, SOB or lightheadedness with hypotension:Yes Has patient had a PCN reaction causing severe rash involving mucus membranes or skin necrosis:No Has patient had a PCN reaction that required hospitalization:Yes Has patient had a PCN reaction occurring  within the last 10 years:No If all of the above answers are "NO", then may proceed with Cephalosporin use.      Medication List    TAKE these medications   amLODipine 10 MG tablet Commonly known as:  NORVASC Take 10 mg by mouth daily.   calcitRIOL 0.25 MCG capsule Commonly known as:  ROCALTROL Take 2 capsules (0.5 mcg total) by mouth daily.   calcium acetate 667 MG capsule Commonly known as:   PHOSLO Take 1,334 mg by mouth 3 (three) times daily.   carvedilol 6.25 MG tablet Commonly known as:  COREG Take 1 tablet (6.25 mg total) by mouth 2 (two) times daily with a meal.   LEVEMIR FLEXTOUCH 100 UNIT/ML Pen Generic drug:  Insulin Detemir Inject 2-8 Units into the skin daily at 10 pm. Sliding Scale   NEPHRO-VITE PO Take 1 tablet by mouth every morning.   triamcinolone ointment 0.1 % Commonly known as:  KENALOG Apply 1 application topically as needed (psorasis).   VELPHORO 500 MG chewable tablet Generic drug:  sucroferric oxyhydroxide Chew 500 mg by mouth 3 (three) times daily with meals.      Allergies  Allergen Reactions  . Penicillins Other (See Comments)    UNSPECIFIED REACTION FROM CHILDHOOD Has patient had a PCN reaction causing immediate rash, facial/tongue/throat swelling, SOB or lightheadedness with hypotension:Yes Has patient had a PCN reaction causing severe rash involving mucus membranes or skin necrosis:No Has patient had a PCN reaction that required hospitalization:Yes Has patient had a PCN reaction occurring within the last 10 years:No If all of the above answers are "NO", then may proceed with Cephalosporin use.        The results of significant diagnostics from this hospitalization (including imaging, microbiology, ancillary and laboratory) are listed below for reference.    Significant Diagnostic Studies: Ct Abdomen Pelvis Wo Contrast  Addendum Date: 08/02/2017   ADDENDUM REPORT: 08/02/2017 19:40 ADDENDUM: After speaking with the clinician, area concern is the anterior abdominal wall in the lower abdomen. There is a soft tissue subcutaneous nodule anterior to the rectus muscle in the lower abdominal wall in the suprapubic region measuring up to 2.3 cm. Slight stranding noted in the subcutaneous soft tissues adjacent to this area. Cannot exclude cellulitis with small developing abscess. Electronically Signed   By: Rolm Baptise M.D.   On: 08/02/2017  19:40   Result Date: 08/02/2017 EXAM: CT ABDOMEN AND PELVIS WITHOUT CONTRAST TECHNIQUE: Multidetector CT imaging of the abdomen and pelvis was performed following the standard protocol without IV contrast. COMPARISON:  None. FINDINGS: Lower chest: Small left pleural effusion with left lower lobe atelectasis. Heart is borderline in size. Right lung base clear. Hepatobiliary: No focal hepatic abnormality. Gallbladder unremarkable. Pancreas: No focal abnormality or ductal dilatation. Spleen: No focal abnormality.  Normal size. Adrenals/Urinary Tract: No adrenal abnormality. No focal renal abnormality. No stones or hydronephrosis. Urinary bladder is unremarkable. Stomach/Bowel: Bowel grossly unremarkable. No evidence of bowel obstruction. Appendix not visualized. Evaluation of the bowel is limited due to limited oral contrast and stranding/haziness throughout the mesentery. Vascular/Lymphatic: Scattered aortic and iliac calcifications. No aneurysm or adenopathy. Reproductive: No visible focal abnormality. Other: Small amount of free fluid in the pelvis. There are locules of free air noted. Peritoneal dialysis catheter noted in the pelvis. Free air is presumably related to recent peritoneal dialysis. Stranding/haziness noted throughout the mesentery, possibly related to 3rd spacing of fluids or fluid overload. Musculoskeletal: No acute bony abnormality. IMPRESSION: Small left pleural effusion with left base  atelectasis. Peritoneal dialysis catheter in the pelvis with small amount of free fluid and locules of free air, presumably related to recent dialysis. Appendix not visualized. No definite acute process in the abdomen or pelvis. Electronically Signed: By: Rolm Baptise M.D. On: 08/02/2017 18:00   Dg Chest 2 View  Result Date: 08/02/2017 CLINICAL DATA:  RLQ abdominal pain and knot in groin. Pt is on daily dialysis and has h/o CHF, PNA, DM. EXAM: CHEST  2 VIEW COMPARISON:  07/14/2017 FINDINGS: Cardiac silhouette is  normal in size. No mediastinal or hilar masses. No convincing adenopathy. Right-sided tunneled dual lumen central venous catheter is stable, distal tip in the right atrium. Left pleural effusion and left mid to lower lung zones scarring is stable from the prior exam. Lungs otherwise clear with no evidence to suggest pneumonia or pulmonary edema. No right pleural effusion. No pneumothorax. Skeletal structures are intact. IMPRESSION: No acute cardiopulmonary disease. Stable chronic changes in the left lung and at the left lung base, with a chronic left pleural effusion. Electronically Signed   By: Lajean Manes M.D.   On: 08/02/2017 17:57   Dg Chest 2 View  Result Date: 07/14/2017 CLINICAL DATA:  Thoracentesis, recent pneumonia EXAM: CHEST  2 VIEW COMPARISON:  06/02/2017 FINDINGS: Dual-lumen right-sided central venous catheter projecting over the right cavoatrial junction. There is no focal parenchymal opacity. Small left pleural effusion. Persistent left lower lobe airspace disease likely reflecting atelectasis versus residual pneumonia. No new focal consolidation. No pneumothorax. Stable cardiomediastinal silhouette. Small amount air under the right diaphragm. Dilated loops of bowel partially visualize in the upper left abdomen. The osseous structures are unremarkable. IMPRESSION: 1. Persistent left lower lobe airspace disease likely reflecting atelectasis versus residual pneumonia. 2. Small amount air under the right diaphragm which may be secondary to instrumentation or peritoneal dialysis versus pneumoperitoneum. Correlate with clinical exam. If there persistent clinical concern recommend upright and supine radiographs of the abdomen. These results were called by telephone at the time of interpretation on 07/14/2017 at 2:07 pm to Dr. Moshe Cipro, who verbally acknowledged these results. Electronically Signed   By: Kathreen Devoid   On: 07/14/2017 14:10    Microbiology: Recent Results (from the past 240  hour(s))  Blood Culture (routine x 2)     Status: None   Collection Time: 08/02/17  6:05 PM  Result Value Ref Range Status   Specimen Description   Final    BLOOD BLOOD RIGHT HAND Performed at [facility] [address] [place], [place] [address]    Special Requests   Final    BOTTLES DRAWN AEROBIC AND ANAEROBIC Blood Culture adequate volume Performed at [facility], [address] [place], [place] [address]    Culture   Final    NO GROWTH 5 DAYS Performed at [facility], [place] [address] [place], [place] [address]    Report Status 08/08/2017 FINAL  Final  Blood Culture (routine x 2)     Status: None   Collection Time: 08/02/17  6:15 PM  Result Value Ref Range Status   Specimen Description   Final    BLOOD BLOOD RIGHT HAND Performed at [facility], [address] [place], [place] [address]    Special Requests IN PEDIATRIC BOTTLE Blood Culture adequate volume  Final   Culture   Final    NO GROWTH 5 DAYS Performed at [facility], [place] [address] [place], [place] [address]  Report Status 08/08/2017 FINAL  Final  MRSA PCR Screening     Status: Abnormal   Collection Time: 08/02/17 10:56 PM  Result Value Ref Range Status   MRSA by PCR POSITIVE (A) NEGATIVE Final    Comment:        The GeneXpert MRSA Assay (FDA approved for NASAL specimens only), is one component of a comprehensive MRSA colonization surveillance program. It is not intended to diagnose MRSA infection nor to guide or monitor treatment for MRSA infections. RESULT CALLED TO, READ BACK BY AND VERIFIED WITH: V. GYEKYE,RN 0301 08/03/2017 T. TYSOR   Stat Gram stain     Status: None   Collection Time: 08/04/17  1:30 PM  Result Value Ref Range Status   Specimen Description PERITONEAL  Final   Special Requests NONE  Final   Gram Stain   Final    MODERATE WBC PRESENT, PREDOMINANTLY PMN RARE GRAM POSITIVE COCCI    Report Status 08/04/2017 FINAL  Final   Culture, body fluid-bottle     Status: None (Preliminary result)   Collection Time: 08/04/17  1:30 PM  Result Value Ref Range Status   Specimen Description PERITONEAL  Final   Special Requests NONE  Final   Culture   Final    NO GROWTH 4 DAYS Performed at [facility], [place] [address], [place], [place] [address]    Report Status PENDING  Incomplete  Culture, body fluid-bottle     Status: None (Preliminary result)   Collection Time: 08/04/17  3:20 PM  Result Value Ref Range Status   Specimen Description PERITONEAL  Final   Special Requests   Final    BOTTLES DRAWN AEROBIC AND ANAEROBIC PERITONEAL WASHINGS   Culture   Final    NO GROWTH 4 DAYS Performed at [facility], [place] [address] [place], [place] [address]    Report Status PENDING  Incomplete  Gram stain     Status: None   Collection Time: 08/04/17  3:20 PM  Result Value Ref Range Status   Specimen Description PERITONEAL  Final   Special Requests PERITONEAL WASHINGS  Final   Gram Stain   Final    CYTOSPIN SMEAR WBC PRESENT, PREDOMINANTLY PMN GRAM POSITIVE COCCI    Report Status 08/04/2017 FINAL  Final     Labs: Basic Metabolic Panel: Recent Labs  Lab 08/03/17 0033 08/04/17 0635 08/05/17 0430 08/06/17 0630 08/07/17 0934 08/08/17 0710  NA 132* 134* 132* 132* 132* 133*  K 2.8* 3.8 3.9 3.7 3.5 3.6  CL 96* 98* 98* 97* 94* 95*  CO2 25 24 25 24 26 25   GLUCOSE 213* 184* 265* 220* 260* 348*  BUN 32* 39* 42* 40* 37* 37*  CREATININE 12.13* 13.28* 14.20* 14.44* 14.54* 14.73*  CALCIUM 6.8* 7.2* 7.5* 7.8* 8.0* 8.1*  MG 3.4*  --   --   --   --   --   PHOS  --  4.9* 4.8* 4.7* 4.9* 4.6   Liver Function Tests: Recent Labs  Lab 08/02/17 1759 08/04/17 0635 08/05/17 0430 08/06/17 0630 08/07/17 0934 08/08/17 0710  AST 24  --   --   --   --   --   ALT 32  --   --   --   --   --   ALKPHOS 148*  --   --   --   --   --   BILITOT 0.3  --   --   --   --   --  PROT 6.8  --   --   --   --   --   ALBUMIN 1.7*  1.5* 1.4* 1.5* 1.4* 1.5*   Recent Labs  Lab 08/02/17 1759  LIPASE 17   No results for input(s): AMMONIA in the last 168 hours. CBC: Recent Labs  Lab 08/02/17 1759 08/03/17 0033 08/04/17 6045 08/05/17 0430 08/06/17 0630 08/07/17 0934 08/08/17 0710  WBC 17.9* 16.0* 13.7* 15.0* 11.6* 9.0 8.3  NEUTROABS 15.3* 12.6*  --   --  9.0*  --  6.2  HGB 9.6* 9.1* 9.9* 9.4* 10.2* 10.6* 10.1*  HCT 29.5* 27.9* 29.9* 29.9* 31.7* 33.6* 32.3*  MCV 87.8 88.9 88.2 88.5 88.8 88.4 88.5  PLT 222 195 202 220 221 220 197   Cardiac Enzymes: No results for input(s): CKTOTAL, CKMB, CKMBINDEX, TROPONINI in the last 168 hours. BNP: BNP (last 3 results) No results for input(s): BNP in the last 8760 hours.  ProBNP (last 3 results) No results for input(s): PROBNP in the last 8760 hours.  CBG: Recent Labs  Lab 08/07/17 1637 08/07/17 2132 08/08/17 0735 08/08/17 1147 08/08/17 1643  GLUCAP 129* 164* 339* 360* 303*       Signed:  Nita Sells MD   [facility] 08/08/2017, 5:54 PM

## 2017-08-08 NOTE — Progress Notes (Signed)
Pharmacy Antibiotic Note  Bryan Wilkerson is a 52 y.o. male admitted on day #7 antibiotics for intra-abdominal infection, cellulitis and r/o peritonitis. Currently on Vancomycin alone, day #5.   1/30: Vanc 2 gm x 1 on 1/30 ~2am   2/2:  random vanc level 18 mcg/ml -> Vanc 125 mg IV given ~3pm   2/4:  random vanc level 30 mcg/ml   Plan:  Re-dose IV Vanc when level drops to 15-20 mcg/ml range.  Planning Vanc IP after discharge.  Shouldn't need a dose for a few days.  Height: 6\' 3"  (190.5 cm) Weight: 211 lb 3.2 oz (95.8 kg) IBW/kg (Calculated) : 84.5  Temp (24hrs), Avg:98 F (36.7 C), Min:97 F (36.1 C), Max:98.3 F (36.8 C)  Recent Labs  Lab 08/02/17 1811  08/04/17 0635 08/05/17 0430 08/06/17 0630 08/07/17 0934 08/08/17 0710  WBC  --    < > 13.7* 15.0* 11.6* 9.0 8.3  CREATININE  --    < > 13.28* 14.20* 14.44* 14.54* 14.73*  LATICACIDVEN 1.10  --   --   --   --   --   --   VANCORANDOM  --   --   --   --  18  --  30   < > = values in this interval not displayed.    Estimated Creatinine Clearance: 7.2 mL/min (A) (by C-G formula based on SCr of 14.73 mg/dL (H)).   CCPD  Allergies  Allergen Reactions  . Penicillins Other (See Comments)    UNSPECIFIED REACTION FROM CHILDHOOD Has patient had a PCN reaction causing immediate rash, facial/tongue/throat swelling, SOB or lightheadedness with hypotension:Yes Has patient had a PCN reaction causing severe rash involving mucus membranes or skin necrosis:No Has patient had a PCN reaction that required hospitalization:Yes Has patient had a PCN reaction occurring within the last 10 years:No If all of the above answers are "NO", then may proceed with Cephalosporin use.      Antimicrobials this admission:  Levaquin 1/29 >> 2/1  Flagyl 1/29 >> 2/1  Vancomycin 1/30>>  CHG/Bactroban 1/30>>2/3  Dose adjustments this admission:  n/a  Microbiology results:  1/31 peritoneal fluid x 2 - no growth x 4 days to date  1/31 PD fluid gram  stain - rare GPC  1/29 Blood x 2 - negative  1/29 MRSA PCR - positive   Thank you for allowing pharmacy to be a part of this patient's care.  Arty Baumgartner, [place] Pager: [phone] 08/08/2017 3:52 PM

## 2017-08-08 NOTE — Progress Notes (Signed)
Pt. discharged to home. Pt after visit summary reviewed and pt capable of re verbalizing medications and follow up appointments. Pt remains stable. No signs and symptoms of distress. Educated to return to ER in the event of SOB, dizziness, chest pain, or fainting. Pheobe Sandiford, RN  

## 2017-08-08 NOTE — Care Management Note (Signed)
Case Management Note  Patient Details  Name: Whitley Strycharz MRN: [id number] Date of Birth: 04-19-1966  Subjective/Objective:     ESRD on PD, abdominal pain, peritionitis               Action/Plan: Spoke to pt and wife at bedside. Pt goes to the [facility], he will follow up with dialysis center tomorrow. And they will order Vancomycin and do instruction on how to administer. NCM spoke to his [place] [facility] # [phone]. Will fax dc summary # [phone] when available.  Expected Discharge Date:  08/08/17               Expected Discharge Plan:  Home/Self Care  In-House Referral:  NA  Discharge planning Services  CM Consult  Post Acute Care Choice:  NA Choice offered to:  NA  DME Arranged:  N/A DME Agency:  NA  HH Arranged:  NA HH Agency:  NA  Status of Service:  Completed, signed off  If discussed at [place] of Stay Meetings, dates discussed:    Additional Comments:  Erenest Rasher, RN 08/08/2017, 5:42 PM

## 2017-08-08 NOTE — Progress Notes (Addendum)
Inpatient Diabetes Program Recommendations  AACE/ADA: New Consensus Statement on Inpatient Glycemic Control (2015)  Target Ranges:  Prepandial:   less than 140 mg/dL      Peak postprandial:   less than 180 mg/dL (1-2 hours)      Critically ill patients:  140 - 180 mg/dL   Results for Bryan Wilkerson, Bryan Wilkerson (MRN [id number]) as of 08/08/2017 11:24  Ref. Range 08/07/2017 07:51 08/07/2017 12:15 08/07/2017 16:37 08/07/2017 21:32 08/08/2017 07:35  Glucose-Capillary Latest Ref Range: 65 - 99 mg/dL 227 (H) 240 (H) 129 (H) 164 (H) 339 (H)   Review of Glycemic Control  Diabetes history: DM 2 Outpatient Diabetes medications: 70/30 Insulin 4 units daily Current orders for Inpatient glycemic control: Novolog Sensitive Correction Scale/ SSI (0-9 units) TID AC + HS  Inpatient Diabetes Program Recommendations:    Fasting glucose elevated. Glucose trends mostly above in patient goal. Patient takes 70/30 insulin with basal component at home. To control hyperglycemia spikes please consider Levemir 8 units daily (0.1 units/kg dosing).  Thanks,  Tama Headings RN, MSN, [facility] Inpatient Diabetes Coordinator Team Pager [phone] (8a-5p)

## 2017-08-08 NOTE — Progress Notes (Signed)
Seen pt in room to check PD cath patency post cath-flo. Draining site well approximated 464mls in 6 mins. No fibrin noted in the PD cath access.. Report given to primary nurse.and primary MD aware.

## 2017-08-08 NOTE — Progress Notes (Signed)
[facility] Progress Note   Subjective:    PD cath draining very slowly again. No abd pain, effluent is clear.  Wants to go home.   Objective Vitals:   08/07/17 1749 08/07/17 2137 08/08/17 0556 08/08/17 0850  BP: (!) 141/74 (!) 141/74 136/73 102/65  Pulse: 72 71 73 70  Resp: 18 17 16 16   Temp: 98.2 F (36.8 C) 98.2 F (36.8 C) 98.1 F (36.7 C) 98.3 F (36.8 C)  TempSrc: Oral Oral Oral Oral  SpO2: 100% 97% 100% 99%  Weight:  95.8 kg (211 lb 3.2 oz)    Height:       Physical Exam General:NAD, well appearing male sitting up in bed Heart:RRR, no m/r/g Lungs:CTAB, BS diminished, nml WOB Abdomen:soft, NTND, +BS Extremities:no LE edema Dialysis Access: PD cath dressed, LU AVF +b/t   [facility] Weights   08/06/17 2112 08/07/17 0920 08/07/17 2137  Weight: 95.6 kg (210 lb 12.2 oz) 95.3 kg (210 lb 1.6 oz) 95.8 kg (211 lb 3.2 oz)    Intake/Output Summary (Last 24 hours) at 08/08/2017 1327 Last data filed at 08/08/2017 0851 Gross per 24 hour  Intake 483 ml  Output 0 ml  Net 483 ml     Recent Labs  Lab 08/06/17 0630 08/07/17 0934 08/08/17 0710  NA 132* 132* 133*  K 3.7 3.5 3.6  CL 97* 94* 95*  CO2 24 26 25   GLUCOSE 220* 260* 348*  BUN 40* 37* 37*  CREATININE 14.44* 14.54* 14.73*  CALCIUM 7.8* 8.0* 8.1*  PHOS 4.7* 4.9* 4.6    Recent Labs  Lab 08/02/17 1759  08/06/17 0630 08/07/17 0934 08/08/17 0710  AST 24  --   --   --   --   ALT 32  --   --   --   --   ALKPHOS 148*  --   --   --   --   BILITOT 0.3  --   --   --   --   PROT 6.8  --   --   --   --   ALBUMIN 1.7*   < > 1.5* 1.4* 1.5*   < > = values in this interval not displayed.   Recent Labs  Lab 08/02/17 1759  LIPASE 17    Recent Labs  Lab 08/03/17 0033 08/04/17 0635 08/05/17 0430 08/06/17 0630 08/07/17 0934 08/08/17 0710  WBC 16.0* 13.7* 15.0* 11.6* 9.0 8.3  NEUTROABS 12.6*  --   --  9.0*  --  6.2  HGB 9.1* 9.9* 9.4* 10.2* 10.6* 10.1*  HCT 27.9* 29.9* 29.9* 31.7* 33.6* 32.3*  MCV  88.9 88.2 88.5 88.8 88.4 88.5  PLT 195 202 220 221 220 197       Component Value Date/Time   SDES PERITONEAL 08/04/2017 1520   SDES PERITONEAL 08/04/2017 1520   SPECREQUEST  08/04/2017 1520    BOTTLES DRAWN AEROBIC AND ANAEROBIC PERITONEAL WASHINGS   SPECREQUEST PERITONEAL WASHINGS 08/04/2017 1520   CULT  08/04/2017 1520    NO GROWTH 3 DAYS Performed at [facility], [place] [address], [place], [place] [address]    REPTSTATUS PENDING 08/04/2017 1520   REPTSTATUS 08/04/2017 FINAL 08/04/2017 1520    Recent Labs  Lab 08/07/17 1215 08/07/17 1637 08/07/17 2132 08/08/17 0735 08/08/17 1147  GLUCAP 240* 129* 164* 339* 360*    Medications: . sodium chloride    . dialysis solution 2.5% low-MG/low-CA     . amLODipine  5 mg Oral Daily  .  calcitRIOL  0.5 mcg Oral Daily  . calcium acetate  1,334 mg Oral TID WC  . carvedilol  6.25 mg Oral BID WC  . gentamicin cream  1 application Topical Daily  . insulin aspart  0-5 Units Subcutaneous QHS  . insulin aspart  0-9 Units Subcutaneous TID WC  . sodium chloride flush  3 mL Intravenous Q12H  . sucroferric oxyhydroxide  500 mg Oral TID WC    Dialysis Orders: CCPD 6 exchanges/day, fill volume 2.45 L, last fill 2.45, no daytime exchanges.  2.5 Ca 0.5 mg solution  Assessment: 1.  Culture negative peritonitis. Clear PD fluid, but elevated cell count, +GPC on gram stain neg culture. BC NGTD. Abdominal pain improving. WBC down. ATB's narrowed to vanco, 2 gm load 1/30 and 1.25 gm on 2/2.  Vanc trough 18, at goal.  Will repeat TPA the cath w/ 8 mg TPA today and if working good will dc home.   2. Knot on SP area - per general surgery likely reactive lymph node  3. ESRD - PD, repeat drainage issues requiring activase x 2, 3rd time pending this afternoon, will ^ dose to 8 mg in 10 mL.   4. Anemia of CKD- Hgb 10.6, 150 mcg mircera on 1/24. Iron extremely low, will need IV Fe once off ABX's. 5. Secondary hyperparathyroidism - Cor Ca & P in goal.   Continue VDRA, binders.  6. HTN/volume - BP stable. No edema. Appears euvolemic on exam. 7. Nutrition - Low albumin 1.4. Renal diet w/protein supplements. Dr. Justin Mend can decide as outpt if pt needs transition to HD for a while until nutrition improved. 8. DM - on insulin, per primary  P - if we can get PD cath working again with TPA, plan dc home. Complete AB course in OP setting for cx-negative peritonitis (gram stain showed gram+cocci).    Kelly Splinter MD [employer] pgr [phone]   08/08/2017, 1:37 PM

## 2017-08-09 DIAGNOSIS — N186 End stage renal disease: Secondary | ICD-10-CM | POA: Diagnosis not present

## 2017-08-09 DIAGNOSIS — K650 Generalized (acute) peritonitis: Secondary | ICD-10-CM | POA: Diagnosis not present

## 2017-08-09 DIAGNOSIS — D509 Iron deficiency anemia, unspecified: Secondary | ICD-10-CM | POA: Diagnosis not present

## 2017-08-09 DIAGNOSIS — E440 Moderate protein-calorie malnutrition: Secondary | ICD-10-CM | POA: Diagnosis not present

## 2017-08-09 DIAGNOSIS — K658 Other peritonitis: Secondary | ICD-10-CM | POA: Diagnosis not present

## 2017-08-09 DIAGNOSIS — R82998 Other abnormal findings in urine: Secondary | ICD-10-CM | POA: Diagnosis not present

## 2017-08-09 DIAGNOSIS — R17 Unspecified jaundice: Secondary | ICD-10-CM | POA: Diagnosis not present

## 2017-08-09 LAB — CULTURE, BODY FLUID-BOTTLE

## 2017-08-09 LAB — CULTURE, BODY FLUID W GRAM STAIN -BOTTLE
Culture: NO GROWTH
Culture: NO GROWTH

## 2017-08-10 DIAGNOSIS — K658 Other peritonitis: Secondary | ICD-10-CM | POA: Diagnosis not present

## 2017-08-10 DIAGNOSIS — E440 Moderate protein-calorie malnutrition: Secondary | ICD-10-CM | POA: Diagnosis not present

## 2017-08-10 DIAGNOSIS — R17 Unspecified jaundice: Secondary | ICD-10-CM | POA: Diagnosis not present

## 2017-08-10 DIAGNOSIS — D509 Iron deficiency anemia, unspecified: Secondary | ICD-10-CM | POA: Diagnosis not present

## 2017-08-10 DIAGNOSIS — K650 Generalized (acute) peritonitis: Secondary | ICD-10-CM | POA: Diagnosis not present

## 2017-08-10 DIAGNOSIS — N186 End stage renal disease: Secondary | ICD-10-CM | POA: Diagnosis not present

## 2017-08-11 DIAGNOSIS — R17 Unspecified jaundice: Secondary | ICD-10-CM | POA: Diagnosis not present

## 2017-08-11 DIAGNOSIS — K650 Generalized (acute) peritonitis: Secondary | ICD-10-CM | POA: Diagnosis not present

## 2017-08-11 DIAGNOSIS — D509 Iron deficiency anemia, unspecified: Secondary | ICD-10-CM | POA: Diagnosis not present

## 2017-08-11 DIAGNOSIS — E440 Moderate protein-calorie malnutrition: Secondary | ICD-10-CM | POA: Diagnosis not present

## 2017-08-11 DIAGNOSIS — K658 Other peritonitis: Secondary | ICD-10-CM | POA: Diagnosis not present

## 2017-08-11 DIAGNOSIS — N186 End stage renal disease: Secondary | ICD-10-CM | POA: Diagnosis not present

## 2017-08-12 DIAGNOSIS — K650 Generalized (acute) peritonitis: Secondary | ICD-10-CM | POA: Diagnosis not present

## 2017-08-12 DIAGNOSIS — N186 End stage renal disease: Secondary | ICD-10-CM | POA: Diagnosis not present

## 2017-08-12 DIAGNOSIS — D509 Iron deficiency anemia, unspecified: Secondary | ICD-10-CM | POA: Diagnosis not present

## 2017-08-12 DIAGNOSIS — E440 Moderate protein-calorie malnutrition: Secondary | ICD-10-CM | POA: Diagnosis not present

## 2017-08-12 DIAGNOSIS — R17 Unspecified jaundice: Secondary | ICD-10-CM | POA: Diagnosis not present

## 2017-08-12 DIAGNOSIS — K658 Other peritonitis: Secondary | ICD-10-CM | POA: Diagnosis not present

## 2017-08-13 DIAGNOSIS — N186 End stage renal disease: Secondary | ICD-10-CM | POA: Diagnosis not present

## 2017-08-13 DIAGNOSIS — D509 Iron deficiency anemia, unspecified: Secondary | ICD-10-CM | POA: Diagnosis not present

## 2017-08-13 DIAGNOSIS — R17 Unspecified jaundice: Secondary | ICD-10-CM | POA: Diagnosis not present

## 2017-08-13 DIAGNOSIS — E440 Moderate protein-calorie malnutrition: Secondary | ICD-10-CM | POA: Diagnosis not present

## 2017-08-13 DIAGNOSIS — K658 Other peritonitis: Secondary | ICD-10-CM | POA: Diagnosis not present

## 2017-08-13 DIAGNOSIS — K650 Generalized (acute) peritonitis: Secondary | ICD-10-CM | POA: Diagnosis not present

## 2017-08-14 DIAGNOSIS — N186 End stage renal disease: Secondary | ICD-10-CM | POA: Diagnosis not present

## 2017-08-14 DIAGNOSIS — D509 Iron deficiency anemia, unspecified: Secondary | ICD-10-CM | POA: Diagnosis not present

## 2017-08-14 DIAGNOSIS — K650 Generalized (acute) peritonitis: Secondary | ICD-10-CM | POA: Diagnosis not present

## 2017-08-14 DIAGNOSIS — E440 Moderate protein-calorie malnutrition: Secondary | ICD-10-CM | POA: Diagnosis not present

## 2017-08-14 DIAGNOSIS — K658 Other peritonitis: Secondary | ICD-10-CM | POA: Diagnosis not present

## 2017-08-14 DIAGNOSIS — R17 Unspecified jaundice: Secondary | ICD-10-CM | POA: Diagnosis not present

## 2017-08-15 DIAGNOSIS — K650 Generalized (acute) peritonitis: Secondary | ICD-10-CM | POA: Diagnosis not present

## 2017-08-15 DIAGNOSIS — K658 Other peritonitis: Secondary | ICD-10-CM | POA: Diagnosis not present

## 2017-08-15 DIAGNOSIS — D509 Iron deficiency anemia, unspecified: Secondary | ICD-10-CM | POA: Diagnosis not present

## 2017-08-15 DIAGNOSIS — E440 Moderate protein-calorie malnutrition: Secondary | ICD-10-CM | POA: Diagnosis not present

## 2017-08-15 DIAGNOSIS — R17 Unspecified jaundice: Secondary | ICD-10-CM | POA: Diagnosis not present

## 2017-08-15 DIAGNOSIS — N186 End stage renal disease: Secondary | ICD-10-CM | POA: Diagnosis not present

## 2017-08-16 DIAGNOSIS — E440 Moderate protein-calorie malnutrition: Secondary | ICD-10-CM | POA: Diagnosis not present

## 2017-08-16 DIAGNOSIS — K658 Other peritonitis: Secondary | ICD-10-CM | POA: Diagnosis not present

## 2017-08-16 DIAGNOSIS — N186 End stage renal disease: Secondary | ICD-10-CM | POA: Diagnosis not present

## 2017-08-16 DIAGNOSIS — D509 Iron deficiency anemia, unspecified: Secondary | ICD-10-CM | POA: Diagnosis not present

## 2017-08-16 DIAGNOSIS — R17 Unspecified jaundice: Secondary | ICD-10-CM | POA: Diagnosis not present

## 2017-08-16 DIAGNOSIS — K650 Generalized (acute) peritonitis: Secondary | ICD-10-CM | POA: Diagnosis not present

## 2017-08-17 DIAGNOSIS — K650 Generalized (acute) peritonitis: Secondary | ICD-10-CM | POA: Diagnosis not present

## 2017-08-17 DIAGNOSIS — R17 Unspecified jaundice: Secondary | ICD-10-CM | POA: Diagnosis not present

## 2017-08-17 DIAGNOSIS — K658 Other peritonitis: Secondary | ICD-10-CM | POA: Diagnosis not present

## 2017-08-17 DIAGNOSIS — E440 Moderate protein-calorie malnutrition: Secondary | ICD-10-CM | POA: Diagnosis not present

## 2017-08-17 DIAGNOSIS — D509 Iron deficiency anemia, unspecified: Secondary | ICD-10-CM | POA: Diagnosis not present

## 2017-08-17 DIAGNOSIS — N186 End stage renal disease: Secondary | ICD-10-CM | POA: Diagnosis not present

## 2017-08-18 DIAGNOSIS — K650 Generalized (acute) peritonitis: Secondary | ICD-10-CM | POA: Diagnosis not present

## 2017-08-18 DIAGNOSIS — D509 Iron deficiency anemia, unspecified: Secondary | ICD-10-CM | POA: Diagnosis not present

## 2017-08-18 DIAGNOSIS — K658 Other peritonitis: Secondary | ICD-10-CM | POA: Diagnosis not present

## 2017-08-18 DIAGNOSIS — R17 Unspecified jaundice: Secondary | ICD-10-CM | POA: Diagnosis not present

## 2017-08-18 DIAGNOSIS — N186 End stage renal disease: Secondary | ICD-10-CM | POA: Diagnosis not present

## 2017-08-18 DIAGNOSIS — E440 Moderate protein-calorie malnutrition: Secondary | ICD-10-CM | POA: Diagnosis not present

## 2017-08-19 DIAGNOSIS — K650 Generalized (acute) peritonitis: Secondary | ICD-10-CM | POA: Diagnosis not present

## 2017-08-19 DIAGNOSIS — N186 End stage renal disease: Secondary | ICD-10-CM | POA: Diagnosis not present

## 2017-08-19 DIAGNOSIS — D509 Iron deficiency anemia, unspecified: Secondary | ICD-10-CM | POA: Diagnosis not present

## 2017-08-19 DIAGNOSIS — E440 Moderate protein-calorie malnutrition: Secondary | ICD-10-CM | POA: Diagnosis not present

## 2017-08-19 DIAGNOSIS — K658 Other peritonitis: Secondary | ICD-10-CM | POA: Diagnosis not present

## 2017-08-19 DIAGNOSIS — R17 Unspecified jaundice: Secondary | ICD-10-CM | POA: Diagnosis not present

## 2017-08-20 DIAGNOSIS — N186 End stage renal disease: Secondary | ICD-10-CM | POA: Diagnosis not present

## 2017-08-20 DIAGNOSIS — K650 Generalized (acute) peritonitis: Secondary | ICD-10-CM | POA: Diagnosis not present

## 2017-08-20 DIAGNOSIS — E440 Moderate protein-calorie malnutrition: Secondary | ICD-10-CM | POA: Diagnosis not present

## 2017-08-20 DIAGNOSIS — R17 Unspecified jaundice: Secondary | ICD-10-CM | POA: Diagnosis not present

## 2017-08-20 DIAGNOSIS — D509 Iron deficiency anemia, unspecified: Secondary | ICD-10-CM | POA: Diagnosis not present

## 2017-08-20 DIAGNOSIS — K658 Other peritonitis: Secondary | ICD-10-CM | POA: Diagnosis not present

## 2017-08-21 DIAGNOSIS — K650 Generalized (acute) peritonitis: Secondary | ICD-10-CM | POA: Diagnosis not present

## 2017-08-21 DIAGNOSIS — E440 Moderate protein-calorie malnutrition: Secondary | ICD-10-CM | POA: Diagnosis not present

## 2017-08-21 DIAGNOSIS — D509 Iron deficiency anemia, unspecified: Secondary | ICD-10-CM | POA: Diagnosis not present

## 2017-08-21 DIAGNOSIS — N186 End stage renal disease: Secondary | ICD-10-CM | POA: Diagnosis not present

## 2017-08-21 DIAGNOSIS — K658 Other peritonitis: Secondary | ICD-10-CM | POA: Diagnosis not present

## 2017-08-21 DIAGNOSIS — R17 Unspecified jaundice: Secondary | ICD-10-CM | POA: Diagnosis not present

## 2017-08-22 ENCOUNTER — Other Ambulatory Visit (HOSPITAL_COMMUNITY): Payer: Self-pay | Admitting: Nephrology

## 2017-08-22 DIAGNOSIS — R17 Unspecified jaundice: Secondary | ICD-10-CM | POA: Diagnosis not present

## 2017-08-22 DIAGNOSIS — K650 Generalized (acute) peritonitis: Secondary | ICD-10-CM | POA: Diagnosis not present

## 2017-08-22 DIAGNOSIS — K658 Other peritonitis: Secondary | ICD-10-CM | POA: Diagnosis not present

## 2017-08-22 DIAGNOSIS — D509 Iron deficiency anemia, unspecified: Secondary | ICD-10-CM | POA: Diagnosis not present

## 2017-08-22 DIAGNOSIS — R609 Edema, unspecified: Secondary | ICD-10-CM

## 2017-08-22 DIAGNOSIS — N186 End stage renal disease: Secondary | ICD-10-CM | POA: Diagnosis not present

## 2017-08-22 DIAGNOSIS — E440 Moderate protein-calorie malnutrition: Secondary | ICD-10-CM | POA: Diagnosis not present

## 2017-08-23 ENCOUNTER — Ambulatory Visit (HOSPITAL_BASED_OUTPATIENT_CLINIC_OR_DEPARTMENT_OTHER)
Admission: RE | Admit: 2017-08-23 | Discharge: 2017-08-23 | Disposition: A | Discharge status: Home / Self Care | Payer: Medicare Other | Source: Ambulatory Visit

## 2017-08-23 DIAGNOSIS — K650 Generalized (acute) peritonitis: Secondary | ICD-10-CM | POA: Diagnosis not present

## 2017-08-23 DIAGNOSIS — R0902 Hypoxemia: Secondary | ICD-10-CM | POA: Diagnosis not present

## 2017-08-23 DIAGNOSIS — D509 Iron deficiency anemia, unspecified: Secondary | ICD-10-CM | POA: Diagnosis not present

## 2017-08-23 DIAGNOSIS — R17 Unspecified jaundice: Secondary | ICD-10-CM | POA: Diagnosis not present

## 2017-08-23 DIAGNOSIS — R609 Edema, unspecified: Secondary | ICD-10-CM | POA: Diagnosis not present

## 2017-08-23 DIAGNOSIS — R404 Transient alteration of awareness: Secondary | ICD-10-CM | POA: Diagnosis not present

## 2017-08-23 DIAGNOSIS — G9341 Metabolic encephalopathy: Secondary | ICD-10-CM | POA: Diagnosis not present

## 2017-08-23 DIAGNOSIS — R936 Abnormal findings on diagnostic imaging of limbs: Secondary | ICD-10-CM

## 2017-08-23 DIAGNOSIS — E440 Moderate protein-calorie malnutrition: Secondary | ICD-10-CM | POA: Diagnosis not present

## 2017-08-23 DIAGNOSIS — R4182 Altered mental status, unspecified: Secondary | ICD-10-CM | POA: Diagnosis not present

## 2017-08-23 DIAGNOSIS — I132 Hypertensive heart and chronic kidney disease with heart failure and with stage 5 chronic kidney disease, or end stage renal disease: Secondary | ICD-10-CM | POA: Diagnosis not present

## 2017-08-23 DIAGNOSIS — I120 Hypertensive chronic kidney disease with stage 5 chronic kidney disease or end stage renal disease: Secondary | ICD-10-CM | POA: Diagnosis not present

## 2017-08-23 DIAGNOSIS — I509 Heart failure, unspecified: Secondary | ICD-10-CM | POA: Diagnosis not present

## 2017-08-23 DIAGNOSIS — R0602 Shortness of breath: Secondary | ICD-10-CM | POA: Diagnosis not present

## 2017-08-23 DIAGNOSIS — N2581 Secondary hyperparathyroidism of renal origin: Secondary | ICD-10-CM | POA: Diagnosis not present

## 2017-08-23 DIAGNOSIS — N186 End stage renal disease: Secondary | ICD-10-CM | POA: Diagnosis not present

## 2017-08-23 DIAGNOSIS — K658 Other peritonitis: Secondary | ICD-10-CM | POA: Diagnosis not present

## 2017-08-23 NOTE — Progress Notes (Signed)
RUE venous duplex prelim: negative for DVT. Superficial thrombosis noted in the right cephalic vein at the antecubital fossa, just above occluded graft in forearm. Landry Mellow, RDMS, RVT Called results to Dr. Jason Nest office.

## 2017-08-24 DIAGNOSIS — E440 Moderate protein-calorie malnutrition: Secondary | ICD-10-CM | POA: Diagnosis not present

## 2017-08-24 DIAGNOSIS — D509 Iron deficiency anemia, unspecified: Secondary | ICD-10-CM | POA: Diagnosis not present

## 2017-08-24 DIAGNOSIS — R17 Unspecified jaundice: Secondary | ICD-10-CM | POA: Diagnosis not present

## 2017-08-24 DIAGNOSIS — K658 Other peritonitis: Secondary | ICD-10-CM | POA: Diagnosis not present

## 2017-08-24 DIAGNOSIS — K650 Generalized (acute) peritonitis: Secondary | ICD-10-CM | POA: Diagnosis not present

## 2017-08-24 DIAGNOSIS — N186 End stage renal disease: Secondary | ICD-10-CM | POA: Diagnosis not present

## 2017-08-25 ENCOUNTER — Encounter (HOSPITAL_COMMUNITY): Payer: Self-pay

## 2017-08-25 ENCOUNTER — Emergency Department (HOSPITAL_COMMUNITY): Payer: Medicare Other

## 2017-08-25 ENCOUNTER — Inpatient Hospital Stay (HOSPITAL_COMMUNITY)
Admission: EM | Admit: 2017-08-25 | Discharge: 2017-08-27 | DRG: 070 | Disposition: A | Discharge status: Home / Self Care | Payer: Medicare Other | Attending: Internal Medicine | Admitting: Internal Medicine

## 2017-08-25 ENCOUNTER — Inpatient Hospital Stay (HOSPITAL_COMMUNITY): Payer: Medicare Other

## 2017-08-25 DIAGNOSIS — I509 Heart failure, unspecified: Secondary | ICD-10-CM | POA: Diagnosis present

## 2017-08-25 DIAGNOSIS — Z9842 Cataract extraction status, left eye: Secondary | ICD-10-CM

## 2017-08-25 DIAGNOSIS — K650 Generalized (acute) peritonitis: Secondary | ICD-10-CM | POA: Diagnosis not present

## 2017-08-25 DIAGNOSIS — D509 Iron deficiency anemia, unspecified: Secondary | ICD-10-CM | POA: Diagnosis not present

## 2017-08-25 DIAGNOSIS — D631 Anemia in chronic kidney disease: Secondary | ICD-10-CM | POA: Diagnosis present

## 2017-08-25 DIAGNOSIS — R4182 Altered mental status, unspecified: Secondary | ICD-10-CM | POA: Diagnosis present

## 2017-08-25 DIAGNOSIS — R0602 Shortness of breath: Secondary | ICD-10-CM | POA: Diagnosis not present

## 2017-08-25 DIAGNOSIS — E10319 Type 1 diabetes mellitus with unspecified diabetic retinopathy without macular edema: Secondary | ICD-10-CM | POA: Diagnosis present

## 2017-08-25 DIAGNOSIS — E440 Moderate protein-calorie malnutrition: Secondary | ICD-10-CM | POA: Diagnosis not present

## 2017-08-25 DIAGNOSIS — E118 Type 2 diabetes mellitus with unspecified complications: Secondary | ICD-10-CM | POA: Diagnosis not present

## 2017-08-25 DIAGNOSIS — R404 Transient alteration of awareness: Secondary | ICD-10-CM | POA: Diagnosis not present

## 2017-08-25 DIAGNOSIS — Z880 Allergy status to penicillin: Secondary | ICD-10-CM | POA: Diagnosis not present

## 2017-08-25 DIAGNOSIS — L409 Psoriasis, unspecified: Secondary | ICD-10-CM | POA: Diagnosis present

## 2017-08-25 DIAGNOSIS — Z992 Dependence on renal dialysis: Secondary | ICD-10-CM

## 2017-08-25 DIAGNOSIS — Z9841 Cataract extraction status, right eye: Secondary | ICD-10-CM | POA: Diagnosis not present

## 2017-08-25 DIAGNOSIS — N2581 Secondary hyperparathyroidism of renal origin: Secondary | ICD-10-CM | POA: Diagnosis present

## 2017-08-25 DIAGNOSIS — M898X9 Other specified disorders of bone, unspecified site: Secondary | ICD-10-CM | POA: Diagnosis present

## 2017-08-25 DIAGNOSIS — I132 Hypertensive heart and chronic kidney disease with heart failure and with stage 5 chronic kidney disease, or end stage renal disease: Secondary | ICD-10-CM | POA: Diagnosis present

## 2017-08-25 DIAGNOSIS — R4020 Unspecified coma: Secondary | ICD-10-CM | POA: Diagnosis not present

## 2017-08-25 DIAGNOSIS — J9811 Atelectasis: Secondary | ICD-10-CM | POA: Diagnosis not present

## 2017-08-25 DIAGNOSIS — N186 End stage renal disease: Secondary | ICD-10-CM | POA: Diagnosis present

## 2017-08-25 DIAGNOSIS — E8770 Fluid overload, unspecified: Secondary | ICD-10-CM | POA: Diagnosis not present

## 2017-08-25 DIAGNOSIS — G9341 Metabolic encephalopathy: Secondary | ICD-10-CM | POA: Diagnosis present

## 2017-08-25 DIAGNOSIS — K658 Other peritonitis: Secondary | ICD-10-CM | POA: Diagnosis not present

## 2017-08-25 DIAGNOSIS — Z8249 Family history of ischemic heart disease and other diseases of the circulatory system: Secondary | ICD-10-CM | POA: Diagnosis not present

## 2017-08-25 DIAGNOSIS — N19 Unspecified kidney failure: Secondary | ICD-10-CM | POA: Diagnosis present

## 2017-08-25 DIAGNOSIS — Z794 Long term (current) use of insulin: Secondary | ICD-10-CM | POA: Diagnosis not present

## 2017-08-25 DIAGNOSIS — I120 Hypertensive chronic kidney disease with stage 5 chronic kidney disease or end stage renal disease: Secondary | ICD-10-CM | POA: Diagnosis not present

## 2017-08-25 DIAGNOSIS — R17 Unspecified jaundice: Secondary | ICD-10-CM | POA: Diagnosis not present

## 2017-08-25 DIAGNOSIS — R509 Fever, unspecified: Secondary | ICD-10-CM

## 2017-08-25 DIAGNOSIS — R0902 Hypoxemia: Secondary | ICD-10-CM | POA: Diagnosis not present

## 2017-08-25 DIAGNOSIS — E1029 Type 1 diabetes mellitus with other diabetic kidney complication: Secondary | ICD-10-CM | POA: Diagnosis not present

## 2017-08-25 DIAGNOSIS — R600 Localized edema: Secondary | ICD-10-CM | POA: Diagnosis not present

## 2017-08-25 LAB — CBC WITH DIFFERENTIAL/PLATELET
Basophils Absolute: 0 10*3/uL (ref 0.0–0.1)
Basophils Relative: 0 %
EOS ABS: 0.3 10*3/uL (ref 0.0–0.7)
Eosinophils Relative: 4 %
HEMATOCRIT: 30.1 % — AB (ref 39.0–52.0)
HEMOGLOBIN: 9.5 g/dL — AB (ref 13.0–17.0)
LYMPHS ABS: 1.8 10*3/uL (ref 0.7–4.0)
Lymphocytes Relative: 24 %
MCH: 27.9 pg (ref 26.0–34.0)
MCHC: 31.6 g/dL (ref 30.0–36.0)
MCV: 88.5 fL (ref 78.0–100.0)
MONO ABS: 0.6 10*3/uL (ref 0.1–1.0)
MONOS PCT: 7 %
NEUTROS ABS: 4.9 10*3/uL (ref 1.7–7.7)
NEUTROS PCT: 65 %
Platelets: 166 10*3/uL (ref 150–400)
RBC: 3.4 MIL/uL — ABNORMAL LOW (ref 4.22–5.81)
RDW: 15.9 % — ABNORMAL HIGH (ref 11.5–15.5)
WBC: 7.5 10*3/uL (ref 4.0–10.5)

## 2017-08-25 LAB — RENAL FUNCTION PANEL
Albumin: 1.8 g/dL — ABNORMAL LOW (ref 3.5–5.0)
Anion gap: 10 (ref 5–15)
BUN: 33 mg/dL — ABNORMAL HIGH (ref 6–20)
CO2: 23 mmol/L (ref 22–32)
Calcium: 7.4 mg/dL — ABNORMAL LOW (ref 8.9–10.3)
Chloride: 101 mmol/L (ref 101–111)
Creatinine, Ser: 12.61 mg/dL — ABNORMAL HIGH (ref 0.61–1.24)
GFR calc Af Amer: 5 mL/min — ABNORMAL LOW (ref >60)
GFR calc non Af Amer: 4 mL/min — ABNORMAL LOW (ref >60)
Glucose, Bld: 154 mg/dL — ABNORMAL HIGH (ref 65–99)
Phosphorus: 6.1 mg/dL — ABNORMAL HIGH (ref 2.5–4.6)
Potassium: 3.7 mmol/L (ref 3.5–5.1)
Sodium: 134 mmol/L — ABNORMAL LOW (ref 135–145)

## 2017-08-25 LAB — I-STAT CHEM 8, ED
BUN: 35 mg/dL — AB (ref 6–20)
CALCIUM ION: 0.97 mmol/L — AB (ref 1.15–1.40)
CREATININE: 12.5 mg/dL — AB (ref 0.61–1.24)
Chloride: 99 mmol/L — ABNORMAL LOW (ref 101–111)
Glucose, Bld: 145 mg/dL — ABNORMAL HIGH (ref 65–99)
HCT: 30 % — ABNORMAL LOW (ref 39.0–52.0)
Hemoglobin: 10.2 g/dL — ABNORMAL LOW (ref 13.0–17.0)
Potassium: 3.7 mmol/L (ref 3.5–5.1)
Sodium: 137 mmol/L (ref 135–145)
TCO2: 27 mmol/L (ref 22–32)

## 2017-08-25 LAB — CBG MONITORING, ED: GLUCOSE-CAPILLARY: 135 mg/dL — AB (ref 65–99)

## 2017-08-25 MED ORDER — LEVOFLOXACIN IN D5W 750 MG/150ML IV SOLN: 750.0000 mg | Freq: Once | INTRAVENOUS | Status: DC

## 2017-08-25 MED ORDER — LIDOCAINE-PRILOCAINE 2.5-2.5 % EX CREA
1.0000 "application " | TOPICAL_CREAM | CUTANEOUS | Status: DC | PRN
  Filled 2017-08-25: qty 5

## 2017-08-25 MED ORDER — SODIUM CHLORIDE 0.9 % IV SOLN: 100.0000 mL | INTRAVENOUS | Status: DC | PRN

## 2017-08-25 MED ORDER — LIDOCAINE HCL (PF) 1 % IJ SOLN: 5.0000 mL | INTRAMUSCULAR | Status: DC | PRN

## 2017-08-25 MED ORDER — SODIUM CHLORIDE 0.9 % IV SOLN
2000.0000 mg | Freq: Once | INTRAVENOUS | Status: DC
  Filled 2017-08-25: qty 2000

## 2017-08-25 MED ORDER — VANCOMYCIN HCL 10 G IV SOLR
2000.0000 mg | Freq: Once | INTRAVENOUS | Status: AC
  Administered 2017-08-26: 2000 mg via INTRAVENOUS
  Filled 2017-08-25 (×2): qty 2000

## 2017-08-25 MED ORDER — HEPARIN SODIUM (PORCINE) 1000 UNIT/ML DIALYSIS
2600.0000 [IU] | Freq: Once | INTRAMUSCULAR | Status: AC
  Administered 2017-08-25: 2600 [IU] via INTRAVENOUS_CENTRAL
  Filled 2017-08-25: qty 3

## 2017-08-25 MED ORDER — PENTAFLUOROPROP-TETRAFLUOROETH EX AERO: 1.0000 "application " | INHALATION_SPRAY | CUTANEOUS | Status: DC | PRN

## 2017-08-25 MED ORDER — LEVOFLOXACIN IN D5W 500 MG/100ML IV SOLN: 500.0000 mg | INTRAVENOUS | Status: DC

## 2017-08-25 MED ORDER — PENTAFLUOROPROP-TETRAFLUOROETH EX AERO
1.0000 "application " | INHALATION_SPRAY | CUTANEOUS | Status: DC | PRN
  Filled 2017-08-25: qty 30

## 2017-08-25 MED ORDER — ACETAMINOPHEN 325 MG PO TABS
650.0000 mg | ORAL_TABLET | Freq: Once | ORAL | Status: AC
  Administered 2017-08-25: 650 mg via ORAL
  Filled 2017-08-25: qty 2

## 2017-08-25 MED ORDER — LEVOFLOXACIN IN D5W 750 MG/150ML IV SOLN
750.0000 mg | Freq: Once | INTRAVENOUS | Status: AC
  Administered 2017-08-26: 750 mg via INTRAVENOUS
  Filled 2017-08-25: qty 150

## 2017-08-25 MED ORDER — LIDOCAINE-PRILOCAINE 2.5-2.5 % EX CREA: 1.0000 "application " | TOPICAL_CREAM | CUTANEOUS | Status: DC | PRN

## 2017-08-25 NOTE — ED Notes (Signed)
Attempted report x 2 

## 2017-08-25 NOTE — ED Notes (Signed)
Attempted report x1. 

## 2017-08-25 NOTE — Progress Notes (Signed)
HD tx initiated via HD cath w/o problem, AP: pull well, push/flush sluggish, VP: pull sluggish, push/flush well, VSS, will cont to monitor while on HD tx

## 2017-08-25 NOTE — ED Triage Notes (Signed)
Pt arrives from home via ems after he was found altered by wife. EMS report when they arrived to the scene pt spo2 <50% RA but gradually came up to96% on NRB. Pt oriented to self only. EMS report fever of 102, left sided facial droop, and hypoxia. PT is PD pt but was supposed to receive HD today d/t retaining fluid over the last few days.

## 2017-08-25 NOTE — ED Provider Notes (Signed)
[facility] EMERGENCY DEPARTMENT Provider Note   CSN: [id number] Arrival date & time: 08/25/17  1342     History   Chief Complaint Chief Complaint  Patient presents with  . Altered Mental Status    HPI Bryan Wilkerson is a 52 y.o. male.  Patient here for evaluation of altered mental status and hypoxia.  He was at home, when EMS was summoned for altered mental status.  They reportedly found him obtunded, with oxygen saturation in the 50s, requiring immediate resuscitation by placement of facemask oxygen.  This did improve his oxygen saturation to normal, and he became more alert.  I saw the patient, at 58: 03, and he was alert, conversant, and comfortable.  At this time he is on nasal cannula oxygen, 2 L, with saturation 99%.  He denies shortness of breath, chest pain, weakness or dizziness.  He last did peritoneal dialysis, 2 days ago and took the fluid out yesterday morning.  By report, his wife told EMS, that he was due to have hemodialysis, today.  Patient is unaware of this.  Patient denies fever although EMS reports that his temperature was 102 at his home.  He did not receive antipyretic medication.  There is been no recent nausea, vomiting, focal weakness or paresthesia.  There are no other known modifying factors.  HPI  Past Medical History:  Diagnosis Date  . Anemia   . CHF (congestive heart failure) ([place])   . Diabetic retinopathy ([place])   . ESRD on peritoneal dialysis ([place])    "7 days/week" (04/20/2017)  . Hypertension   . Pneumonia 2016; 04/19/2017  . Psoriasis   . Type I diabetes mellitus [facility])     Patient Active Problem List   Diagnosis Date Noted  . Sepsis ([place]) 08/02/2017  . Cellulitis and abscess of trunk 08/02/2017  . Generalized abdominal pain 04/27/2017  . Loculated pleural effusion 04/27/2017  . Pneumonia 04/19/2017  . Diabetes mellitus due to underlying condition, uncontrolled, with stage 4 chronic kidney disease, with long-term  current use of insulin ([place]) 01/13/2017  . Seizure ([place]) 11/23/2016  . Hypocalcemia 11/23/2016  . ESRD on dialysis ([place]) 11/16/2013  . Pre-operative cardiovascular examination 11/12/2013  . Dyspnea 10/31/2013  . CHF (congestive heart failure), NYHA class II ([place]) 12/29/2012  . Essential hypertension, benign 12/29/2012  . Hypokalemia 12/29/2012  . Anemia 12/29/2012    Past Surgical History:  Procedure Laterality Date  . AV FISTULA PLACEMENT Right 12/05/2013   Procedure: RADIOCEPHALIC VS. BRACHIOCEPHALIC ARTERIOVENOUS (AV) FISTULA CREATION;  Surgeon: Conrad Talco, MD;  Location: [facility];  Service: Vascular;  Laterality: Right;  . AV FISTULA PLACEMENT Left 07/20/2016   Procedure: LEFT ARM RADIOCEPHALIC ARTERIOVENOUS (AV) FISTULA CREATION;  Surgeon: Waynetta Sandy, MD;  Location: [facility];  Service: Vascular;  Laterality: Left;  . CATARACT EXTRACTION W/ INTRAOCULAR LENS  IMPLANT, BILATERAL Bilateral   . EYE SURGERY    . FISTULOGRAM Right 07/16/2014   Procedure: FISTULOGRAM;  Surgeon: Conrad La Homa, MD;  Location: [facility];  Service: Vascular;  Laterality: Right;  . IR THORACENTESIS ASP PLEURAL SPACE W/IMG GUIDE  05/02/2017  . LIGATION OF COMPETING BRANCHES OF ARTERIOVENOUS FISTULA Right 07/16/2014   Procedure: LIGATION OF COMPETING BRANCHES OF ARTERIOVENOUS FISTULA;  Surgeon: Conrad [place], MD;  Location: [facility];  Service: Vascular;  Laterality: Right;  . PERITONEAL CATHETER INSERTION Left ~ 08/2016  . PORT-A-CATH REMOVAL  2017  . PORTA CATH INSERTION Right    "for hemodialysis"  .  RETINAL DETACHMENT SURGERY Right        Home Medications    Prior to Admission medications   Medication Sig Start Date End Date Taking? Authorizing Provider  amLODipine (NORVASC) 5 MG tablet Take 5 mg by mouth daily.  07/19/17  Yes [provider]  B Complex-C-Folic Acid (NEPHRO-VITE PO) Take 1 tablet by mouth every morning.   Yes [provider]  calcitRIOL (ROCALTROL) 0.25 MCG capsule Take  2 capsules (0.5 mcg total) by mouth daily. Patient taking differently: Take 0.25 mcg by mouth 3 (three) times daily.  04/22/17  Yes Hongalgi, Lenis Dickinson, MD  carvedilol (COREG) 6.25 MG tablet Take 1 tablet (6.25 mg total) by mouth 2 (two) times daily with a meal. Patient taking differently: Take 25 mg by mouth 2 (two) times daily with a meal.  11/27/16  Yes Hosie Poisson, MD  NOVOLOG FLEXPEN 100 UNIT/ML FlexPen Sliding scale over  150-315 1 units 08/24/17  Yes [provider]  sucroferric oxyhydroxide (VELPHORO) 500 MG chewable tablet Chew 500 mg by mouth 3 (three) times daily with meals.   Yes [provider]  triamcinolone ointment (KENALOG) 0.1 % Apply 1 application topically as needed (psorasis).  10/10/13  Yes [provider]  LEVEMIR FLEXTOUCH 100 UNIT/ML Pen Inject 2-8 Units into the skin daily at 10 pm. Sliding Scale 05/30/17   [provider]    Family History Family History  Problem Relation Age of Onset  . Hypertension Mother   . Heart attack Mother   . Chronic Renal Failure Neg Hx   . Diabetes Neg Hx   . Stroke Neg Hx   . Cancer Neg Hx     Social History Social History   Tobacco Use  . Smoking status: Never Smoker  . Smokeless tobacco: Never Used  Substance Use Topics  . Alcohol use: No    Alcohol/week: 0.0 oz  . Drug use: No     Allergies   Penicillins   Review of Systems Review of Systems  All other systems reviewed and are negative.    Physical Exam Updated Vital Signs BP 128/62   Pulse 80   Temp 100.3 F (37.9 C) (Oral)   Resp 16   SpO2 100%   Physical Exam  Constitutional: He is oriented to person, place, and time. He appears well-developed and well-nourished. No distress.  HENT:  Head: Normocephalic and atraumatic.  Right Ear: External ear normal.  Left Ear: External ear normal.  Eyes: Conjunctivae and EOM are normal. Pupils are equal, round, and reactive to light.  Neck: Normal range of motion and phonation  normal. Neck supple.  Cardiovascular: Normal rate, regular rhythm and normal heart sounds.  Right upper chest wall dialysis catheter, site and appliance appears normal.  Pulmonary/Chest: Effort normal and breath sounds normal. No stridor. No respiratory distress. He has no wheezes. He exhibits no bony tenderness.  Abdominal: Soft. There is no tenderness.  Musculoskeletal: Normal range of motion.  Right arm is swollen as compared to left but the right arm is nontender.  Neurological: He is alert and oriented to person, place, and time. No cranial nerve deficit or sensory deficit. He exhibits normal muscle tone. Coordination normal.  No dysarthria, or aphasia.  Skin: Skin is warm, dry and intact.  Psychiatric: He has a normal mood and affect. His behavior is normal. Judgment and thought content normal.  Nursing note and vitals reviewed.    ED Treatments / Results  Labs (all labs ordered are listed, but  only abnormal results are displayed) Labs Reviewed  CBC WITH DIFFERENTIAL/PLATELET - Abnormal; Notable for the following components:      Result Value   RBC 3.40 (*)    Hemoglobin 9.5 (*)    HCT 30.1 (*)    RDW 15.9 (*)    All other components within normal limits  RENAL FUNCTION PANEL - Abnormal; Notable for the following components:   Sodium 134 (*)    Glucose, Bld 154 (*)    BUN 33 (*)    Creatinine, Ser 12.61 (*)    Calcium 7.4 (*)    Phosphorus 6.1 (*)    Albumin 1.8 (*)    GFR calc non Af Amer 4 (*)    GFR calc Af Amer 5 (*)    All other components within normal limits  CBG MONITORING, ED - Abnormal; Notable for the following components:   Glucose-Capillary 135 (*)    All other components within normal limits  I-STAT CHEM 8, ED - Abnormal; Notable for the following components:   Chloride 99 (*)    BUN 35 (*)    Creatinine, Ser 12.50 (*)    Glucose, Bld 145 (*)    Calcium, Ion 0.97 (*)    Hemoglobin 10.2 (*)    HCT 30.0 (*)    All other components within normal limits     EKG  EKG Interpretation None       Radiology Dg Chest Port 1 View  Result Date: 08/25/2017 CLINICAL DATA:  Shortness of breath. EXAM: PORTABLE CHEST 1 VIEW COMPARISON:  08/02/2017 FINDINGS: Right IJ central venous catheter with tip over the right atrium without significant change. Lungs are adequately inflated demonstrate worsening hazy opacification over the left base likely small effusion with atelectasis although infection is possible. There is cardiomegaly. Remainder the exam is unchanged. IMPRESSION: Worsening left base opacification likely effusion with atelectasis although infection is possible. Cardiomegaly. Right IJ central venous catheter unchanged with tip over the right atrium. Electronically Signed   By: Marin Olp M.D.   On: 08/25/2017 14:23    Procedures Procedures (including critical care time)  Medications Ordered in ED Medications  acetaminophen (TYLENOL) tablet 650 mg (650 mg Oral Given 08/25/17 1537)     Initial Impression / Assessment and Plan / ED Course  I have reviewed the triage vital signs and the nursing notes.  Pertinent labs & imaging results that were available during my care of the patient were reviewed by me and considered in my medical decision making (see chart for details).  Clinical Course as of Aug 26 1723  Thu Aug 25, 2017  1443 Case discussed with nephrology, Dr. Jonnie Finner, who will evaluate the patient and likely arrange for dialysis, today.  [EW]    Clinical Course User Index [EW] Daleen Bo, MD     Patient Vitals for the past 24 hrs:  BP Temp Temp src Pulse Resp SpO2  08/25/17 1515 128/62 - - 80 16 100 %  08/25/17 1430 (!) 138/59 - - 79 11 99 %  08/25/17 1400 130/65 - - 78 15 98 %  08/25/17 1359 - 100.3 F (37.9 C) Oral - - -  08/25/17 1353 133/67 - - 78 12 95 %  08/25/17 1352 - - - - - (!) 50 %         Final Clinical Impressions(s) / ED Diagnoses   Final diagnoses:  None   Confusion, with hypoxia.  Patient  clinically fluid overloaded.  Patient's creatinine is markedly elevated.  Low-grade temperature 100.3 is present.  Screening blood work indicates normal white count 7.5, hemoglobin low 9.5.  BUN high 33, creatinine high at 12.6.  Glucose somewhat elevated at 135.  Mental status, and oxygenation have improved.  Patient be maintained on nasal cannula oxygen with normal oxygen saturation.  Unclear cause of transient altered mental status.  Patient will require admission for dialysis and further treatment with monitoring.  Nursing Notes Reviewed/ Care Coordinated Applicable Imaging Reviewed Interpretation of Laboratory Data incorporated into ED treatment   Plan: [place]  ED Discharge Orders    None       Daleen Bo, MD 08/25/17 1728

## 2017-08-25 NOTE — Consult Note (Addendum)
[facility] Renal Consultation Note    Indication for Consultation:  Management of ESRD/hemodialysis; anemia, hypertension/volume and secondary hyperparathyroidism PCP:  HPI: Bryan Wilkerson is a 52 y.o. male with ESRD formally on PD now transitioned to hemodialysis S/P culture negative peritonitis, inability to remove adequate volume with PD. PMH of DMT1, HTN, AOCD, SHPT, CHF NYHA class II, hypocalcemia, psoriasis.   Patient presented to ED after being found with altered mental status this AM by wife. Wife states that he did not seem to know where he was, became agitated with jerking movements of UE. States that when she attempted to get him to go to dialysis, he told her that he was already there. Wife was concerned that he'd has stroke and called EMS. Upon arrival, EMS reported temperature of 102, L sided facial droop. Patient was brought to ED for evaluation. Temp on arrival to ED 100.3 BP 133/67 Scr 12.5 K+ 3.7 HGB 10.2. Patient is currently awake, alert, oriented X 3. Cannot recall events of earlier in day but does recall that he took his antihypertensive meds. ROS basically negative, says he has been feeling well, no issues since DC from hospital 08/08/2017.    Past Medical History:  Diagnosis Date  . Anemia   . CHF (congestive heart failure) ([place])   . Diabetic retinopathy ([place])   . ESRD on peritoneal dialysis ([place])    "7 days/week" (04/20/2017)  . Hypertension   . Pneumonia 2016; 04/19/2017  . Psoriasis   . Type I diabetes mellitus ([place])    Past Surgical History:  Procedure Laterality Date  . AV FISTULA PLACEMENT Right 12/05/2013   Procedure: RADIOCEPHALIC VS. BRACHIOCEPHALIC ARTERIOVENOUS (AV) FISTULA CREATION;  Surgeon: Conrad Omena, MD;  Location: [facility];  Service: Vascular;  Laterality: Right;  . AV FISTULA PLACEMENT Left 07/20/2016   Procedure: LEFT ARM RADIOCEPHALIC ARTERIOVENOUS (AV) FISTULA CREATION;  Surgeon: Waynetta Sandy, MD;  Location: [facility];   Service: Vascular;  Laterality: Left;  . CATARACT EXTRACTION W/ INTRAOCULAR LENS  IMPLANT, BILATERAL Bilateral   . EYE SURGERY    . FISTULOGRAM Right 07/16/2014   Procedure: FISTULOGRAM;  Surgeon: Conrad Comern­o, MD;  Location: [facility];  Service: Vascular;  Laterality: Right;  . IR THORACENTESIS ASP PLEURAL SPACE W/IMG GUIDE  05/02/2017  . LIGATION OF COMPETING BRANCHES OF ARTERIOVENOUS FISTULA Right 07/16/2014   Procedure: LIGATION OF COMPETING BRANCHES OF ARTERIOVENOUS FISTULA;  Surgeon: Conrad Nemaha, MD;  Location: [facility];  Service: Vascular;  Laterality: Right;  . PERITONEAL CATHETER INSERTION Left ~ 08/2016  . PORT-A-CATH REMOVAL  2017  . PORTA CATH INSERTION Right    "for hemodialysis"  . RETINAL DETACHMENT SURGERY Right    Family History  Problem Relation Age of Onset  . Hypertension Mother   . Heart attack Mother   . Chronic Renal Failure Neg Hx   . Diabetes Neg Hx   . Stroke Neg Hx   . Cancer Neg Hx    Social History:  reports that  has never smoked. he has never used smokeless tobacco. He reports that he does not drink alcohol or use drugs. Allergies  Allergen Reactions  . Penicillins Other (See Comments)    UNSPECIFIED REACTION FROM CHILDHOOD Has patient had a PCN reaction causing immediate rash, facial/tongue/throat swelling, SOB or lightheadedness with hypotension:Yes Has patient had a PCN reaction causing severe rash involving mucus membranes or skin necrosis:No Has patient had a PCN reaction that required hospitalization:Yes Has patient had a PCN reaction  occurring within the last 10 years:No If all of the above answers are "NO", then may proceed with Cephalosporin use.     Prior to Admission medications   Medication Sig Start Date End Date Taking? Authorizing Provider  amLODipine (NORVASC) 10 MG tablet Take 10 mg by mouth daily. 07/19/17   [provider]  B Complex-C-Folic Acid (NEPHRO-VITE PO) Take 1 tablet by mouth every morning.    [provider]   calcitRIOL (ROCALTROL) 0.25 MCG capsule Take 2 capsules (0.5 mcg total) by mouth daily. 04/22/17   Hongalgi, Lenis Dickinson, MD  calcium acetate (PHOSLO) 667 MG capsule Take 1,334 mg by mouth 3 (three) times daily. 08/01/17   [provider]  carvedilol (COREG) 6.25 MG tablet Take 1 tablet (6.25 mg total) by mouth 2 (two) times daily with a meal. 11/27/16   Hosie Poisson, MD  LEVEMIR FLEXTOUCH 100 UNIT/ML Pen Inject 2-8 Units into the skin daily at 10 pm. Sliding Scale 05/30/17   [provider]  sucroferric oxyhydroxide (VELPHORO) 500 MG chewable tablet Chew 500 mg by mouth 3 (three) times daily with meals.    [provider]  triamcinolone ointment (KENALOG) 0.1 % Apply 1 application topically as needed (psorasis).  10/10/13   [provider]   No current facility-administered medications for this encounter.    Current Outpatient Medications  Medication Sig Dispense Refill  . amLODipine (NORVASC) 10 MG tablet Take 10 mg by mouth daily.    . B Complex-C-Folic Acid (NEPHRO-VITE PO) Take 1 tablet by mouth every morning.    . calcitRIOL (ROCALTROL) 0.25 MCG capsule Take 2 capsules (0.5 mcg total) by mouth daily. 30 capsule 0  . calcium acetate (PHOSLO) 667 MG capsule Take 1,334 mg by mouth 3 (three) times daily.    . carvedilol (COREG) 6.25 MG tablet Take 1 tablet (6.25 mg total) by mouth 2 (two) times daily with a meal. 60 tablet 0  . LEVEMIR FLEXTOUCH 100 UNIT/ML Pen Inject 2-8 Units into the skin daily at 10 pm. Sliding Scale    . sucroferric oxyhydroxide (VELPHORO) 500 MG chewable tablet Chew 500 mg by mouth 3 (three) times daily with meals.    . triamcinolone ointment (KENALOG) 0.1 % Apply 1 application topically as needed (psorasis).      Labs: Basic Metabolic Panel: Recent Labs  Lab 08/25/17 1432  NA 137  K 3.7  CL 99*  GLUCOSE 145*  BUN 35*  CREATININE 12.50*   Liver Function Tests: No results for input(s): AST, ALT, ALKPHOS, BILITOT, PROT, ALBUMIN in  the last 168 hours. No results for input(s): LIPASE, AMYLASE in the last 168 hours. No results for input(s): AMMONIA in the last 168 hours. CBC: Recent Labs  Lab 08/25/17 1407 08/25/17 1432  WBC 7.5  --   NEUTROABS 4.9  --   HGB 9.5* 10.2*  HCT 30.1* 30.0*  MCV 88.5  --   PLT 166  --    Cardiac Enzymes: No results for input(s): CKTOTAL, CKMB, CKMBINDEX, TROPONINI in the last 168 hours. CBG: Recent Labs  Lab 08/25/17 1357  GLUCAP 135*   Iron Studies: No results for input(s): IRON, TIBC, TRANSFERRIN, FERRITIN in the last 72 hours. Studies/Results: Dg Chest Port 1 View  Result Date: 08/25/2017 CLINICAL DATA:  Shortness of breath. EXAM: PORTABLE CHEST 1 VIEW COMPARISON:  08/02/2017 FINDINGS: Right IJ central venous catheter with tip over the right atrium without significant change. Lungs are adequately inflated demonstrate worsening hazy opacification over the left base likely  small effusion with atelectasis although infection is possible. There is cardiomegaly. Remainder the exam is unchanged. IMPRESSION: Worsening left base opacification likely effusion with atelectasis although infection is possible. Cardiomegaly. Right IJ central venous catheter unchanged with tip over the right atrium. Electronically Signed   By: Marin Olp M.D.   On: 08/25/2017 14:23    ROS: As per HPI otherwise negative.  Physical Exam: Vitals:   08/25/17 1359 08/25/17 1400 08/25/17 1430 08/25/17 1515  BP:  130/65 (!) 138/59 128/62  Pulse:  78 79 80  Resp:  15 11 16   Temp: 100.3 F (37.9 C)     TempSrc: Oral     SpO2:  98% 99% 100%     General: Well developed, well nourished, in no acute distress. Head: Normocephalic, atraumatic, sclera non-icteric, mucus membranes are moist Neck: Supple. JVD 1/3 up to mandible.  Lungs: Bilateral breath sounds with few bibasilar crackles. No SOB.  Heart: RRR with S1 S2. No murmurs, rubs, or gallops appreciated. Abdomen: Soft, non-tender, non-distended with  normoactive bowel sounds. No rebound/guarding. No obvious abdominal masses. M-S:  Strength and tone appear normal for age. Lower extremities: Trace BLE edema, woody appearance BLE.  Neuro: Alert and oriented X 3. Moves all extremities spontaneously. Psych:  Responds to questions appropriately with a normal affect. Dialysis Access: LFA AVF + bruit RIJ TDC drsg CDI  Dialysis Orders: [facility] T,Th,S 4 hr 15 min 180 NRe 450/800 86 kg 2.0 K/2.25 Ca  -Heparin 2600 units IV TIW  BMD meds: -Velphoro 500 mg PO TID AC  Assessment/Plan: 1.  AMS likely 2/2 uremia. Altered earlier in day, now back to baseline. Scr 12.5. PD patient that has switched to HD temporarily D/T recent peritonitis. Serial HD for removal of urea/volume Progressive vol and solute overload suspected due to PD failure.  Was going to start tiw HD this week , had one Rx on Tuesday but now in hospital for confusion.  On exam sig vol overload and signs of uremia, will need serial hemodialysis to resolve these issues.  PD will likely have to be abandoned.  Will d/w further w/ pt's primary nephrologist tomorrow.  2.  ESRD - T,Th, S K+3.7 use 4.0 K bath No heparin  3.  Hypertension/volume  -BP well controlled-did take antihypertensive meds early this AM. May hamper volume removal. Left last HD session 11.9 kg over OP EDW. Attempt 5 liters but doubt we will be able to pull this much volume today.  4.  Anemia  - HGB 9.5 No OP ESA. Monitor HGB  5.  Metabolic bone disease - Continue binders.  6.  Nutrition - NPO at present. Last OP Albumin 2.0 7.  DMT1: per primary 8.    H/O Culture negative peritonitis.   Rita H. Owens Shark, NP-C 08/25/2017, 3:55 PM  [facility] Beeper [phone]  Pt seen, examined, agree w assess/plan as above with additions as indicated.  Kelly Splinter MD [employer] pager [phone]    cell [phone] 08/25/2017, 5:12 PM

## 2017-08-25 NOTE — Progress Notes (Signed)
Pharmacy Antibiotic Note  Bryan Wilkerson is a 52 y.o. male admitted on 08/25/2017 with pneumonia.  Pharmacy has been consulted for vancomycin and Levaquin dosing.  Planning on going to HD tonight and already off the floor. May just wait and give vanc 2g near end of HD session  Plan: Give vancomycin 2g IV x 1, then follow up rest of  HD schedule Give Levaquin 750mg  IV x 1, then start Levaquin 500mg  IV Q48h Monitor clinical picture, renal function, pre-HD VR F/U C&S, abx deescalation / LOT    Temp (24hrs), Avg:100.3 F (37.9 C), Min:100.3 F (37.9 C), Max:100.3 F (37.9 C)  Recent Labs  Lab 08/25/17 1407 08/25/17 1432  WBC 7.5  --   CREATININE 12.61* 12.50*    CrCl cannot be calculated (Unknown ideal weight.).    Allergies  Allergen Reactions  . Penicillins Other (See Comments)    UNSPECIFIED REACTION FROM CHILDHOOD Has patient had a PCN reaction causing immediate rash, facial/tongue/throat swelling, SOB or lightheadedness with hypotension:Yes Has patient had a PCN reaction causing severe rash involving mucus membranes or skin necrosis:No Has patient had a PCN reaction that required hospitalization:Yes Has patient had a PCN reaction occurring within the last 10 years:No If all of the above answers are "NO", then may proceed with Cephalosporin use.      Thank you for allowing pharmacy to be a part of this patient's care.  Reginia Naas 08/25/2017 9:06 PM

## 2017-08-25 NOTE — ED Notes (Signed)
Patient transported to CT 

## 2017-08-25 NOTE — H&P (Addendum)
TRH H&P   Patient Demographics:    Keahi Mccarney, is a 52 y.o. male  MRN: [id number]   DOB - 05-07-1966  Admit Date - 08/25/2017  Outpatient Primary MD for the patient is Copland, Gay Filler, MD  Referring MD/NP/PA: Daleen Bo  Outpatient Specialists:    Edrick Oh (nephrology)  Patient coming from:  home  Chief Complaint  Patient presents with  . Altered Mental Status      HPI:    Antino Mayabb  is a 52 y.o. male, w DM2, CHF (EF 60-65%),  ESRD on peritoneal dialysis up til this week, had hemodialysis on Tuesday.  Pt had some decrease in responsiveness earlier today. And didn't receive HD as scheduled.  Pt denies any recent medication change.  No narcotics. No hypoglycemia.  Pt was brought in by family for AMS.   In ED, T100.3   CT brain IMPRESSION: 1. No acute intracranial abnormality identified. 2. Stable chronic microvascular ischemic changes and parenchymal volume loss of the brain given differences in technique. 3. Progression of severe paranasal sinus disease with near complete opacification of frontal, maxillary, and ethmoid sinuses.  CXR IMPRESSION: Worsening left base opacification likely effusion with atelectasis although infection is possible.  Cardiomegaly.  Right IJ central venous catheter unchanged with tip over the right atrium.  Wbc 7.5, Hgb 9.5, Plt 166 Na 134 K 3.7, Bun 33, Creatinine 12.61 Alb 1.8 Phos 6.1 Calclium 7.4  Pt will be admitted for AMS possibly secondary to uremia.       Review of systems:    In addition to the HPI above,   No Fever-chills, No Headache, No changes with Vision or hearing, No problems swallowing food or Liquids, No Chest pain, Cough or Shortness of Breath, No Abdominal pain, No Nausea or Vommitting, Bowel movements are regular, No Blood in stool or Urine, No dysuria, No new skin rashes or  bruises, No new joints pains-aches,  No new weakness, tingling, numbness in any extremity, No recent weight gain or loss, No polyuria, polydypsia or polyphagia, No significant Mental Stressors.  A full 10 point Review of Systems was done, except as stated above, all other Review of Systems were negative.   With Past History of the following :    Past Medical History:  Diagnosis Date  . Anemia   . CHF (congestive heart failure) ([place])   . Diabetic retinopathy ([place])   . ESRD on peritoneal dialysis ([place])    "7 days/week" (04/20/2017)  . Hypertension   . Pneumonia 2016; 04/19/2017  . Psoriasis   . Type I diabetes mellitus ([place])       Past Surgical History:  Procedure Laterality Date  . AV FISTULA PLACEMENT Right 12/05/2013   Procedure: RADIOCEPHALIC VS. BRACHIOCEPHALIC ARTERIOVENOUS (AV) FISTULA CREATION;  Surgeon: Conrad Canalou, MD;  Location: [facility];  Service: Vascular;  Laterality: Right;  . AV FISTULA PLACEMENT  Left 07/20/2016   Procedure: LEFT ARM RADIOCEPHALIC ARTERIOVENOUS (AV) FISTULA CREATION;  Surgeon: Waynetta Sandy, MD;  Location: [facility];  Service: Vascular;  Laterality: Left;  . CATARACT EXTRACTION W/ INTRAOCULAR LENS  IMPLANT, BILATERAL Bilateral   . EYE SURGERY    . FISTULOGRAM Right 07/16/2014   Procedure: FISTULOGRAM;  Surgeon: Conrad Cass, MD;  Location: [facility];  Service: Vascular;  Laterality: Right;  . IR THORACENTESIS ASP PLEURAL SPACE W/IMG GUIDE  05/02/2017  . LIGATION OF COMPETING BRANCHES OF ARTERIOVENOUS FISTULA Right 07/16/2014   Procedure: LIGATION OF COMPETING BRANCHES OF ARTERIOVENOUS FISTULA;  Surgeon: Conrad Ridgecrest, MD;  Location: [facility];  Service: Vascular;  Laterality: Right;  . PERITONEAL CATHETER INSERTION Left ~ 08/2016  . PORT-A-CATH REMOVAL  2017  . PORTA CATH INSERTION Right    "for hemodialysis"  . RETINAL DETACHMENT SURGERY Right       Social History:     Social History   Tobacco Use  . Smoking status: Never Smoker  . Smokeless  tobacco: Never Used  Substance Use Topics  . Alcohol use: No    Alcohol/week: 0.0 oz     Lives - at home  Mobility - walks by self   Family History :     Family History  Problem Relation Age of Onset  . Hypertension Mother   . Heart attack Mother   . Chronic Renal Failure Neg Hx   . Diabetes Neg Hx   . Stroke Neg Hx   . Cancer Neg Hx       Home Medications:   Prior to Admission medications   Medication Sig Start Date End Date Taking? Authorizing Provider  amLODipine (NORVASC) 5 MG tablet Take 5 mg by mouth daily.  07/19/17  Yes [provider]  B Complex-C-Folic Acid (NEPHRO-VITE PO) Take 1 tablet by mouth every morning.   Yes [provider]  calcitRIOL (ROCALTROL) 0.25 MCG capsule Take 2 capsules (0.5 mcg total) by mouth daily. Patient taking differently: Take 0.25 mcg by mouth 3 (three) times daily.  04/22/17  Yes Hongalgi, Lenis Dickinson, MD  carvedilol (COREG) 6.25 MG tablet Take 1 tablet (6.25 mg total) by mouth 2 (two) times daily with a meal. Patient taking differently: Take 25 mg by mouth 2 (two) times daily with a meal.  11/27/16  Yes Hosie Poisson, MD  NOVOLOG FLEXPEN 100 UNIT/ML FlexPen Sliding scale over  150-315 1 units 08/24/17  Yes [provider]  sucroferric oxyhydroxide (VELPHORO) 500 MG chewable tablet Chew 500 mg by mouth 3 (three) times daily with meals.   Yes [provider]  triamcinolone ointment (KENALOG) 0.1 % Apply 1 application topically as needed (psorasis).  10/10/13  Yes [provider]  LEVEMIR FLEXTOUCH 100 UNIT/ML Pen Inject 2-8 Units into the skin daily at 10 pm. Sliding Scale 05/30/17   [provider]     Allergies:     Allergies  Allergen Reactions  . Penicillins Other (See Comments)    UNSPECIFIED REACTION FROM CHILDHOOD Has patient had a PCN reaction causing immediate rash, facial/tongue/throat swelling, SOB or lightheadedness with hypotension:Yes Has patient had a PCN reaction causing  severe rash involving mucus membranes or skin necrosis:No Has patient had a PCN reaction that required hospitalization:Yes Has patient had a PCN reaction occurring within the last 10 years:No If all of the above answers are "NO", then may proceed with Cephalosporin use.       Physical Exam:   Vitals  Blood pressure 132/69,  pulse 76, temperature 100.3 F (37.9 C), temperature source Oral, resp. rate 17, SpO2 97 %.   1. General  lying in bed in NAD,   2. Normal affect and insight, Not Suicidal or Homicidal, Awake Alert, Oriented X 3.  3. No F.N deficits, ALL C.Nerves Intact, Strength 5/5 all 4 extremities, Sensation intact all 4 extremities, Plantars down going.  4. Ears and Eyes appear Normal, Conjunctivae clear, PERRLA. Moist Oral Mucosa. Pupil 1.73m right and left 169m(difference old)  5. Supple Neck, No JVD, No cervical lymphadenopathy appriciated, No Carotid Bruits.  6. Symmetrical Chest wall movement, Good air movement bilaterally, CTAB.  7. RRR, s1, s2, 2/6 sem llsb 8. Positive Bowel Sounds, Abdomen Soft, No tenderness, No organomegaly appriciated,No rebound -guarding or rigidity.  9.  No Cyanosis, Normal Skin Turgor, No Skin Rash or Bruise.  10. Good muscle tone,  joints appear normal , no effusions, Normal ROM.  11. No Palpable Lymph Nodes in Neck or Axillae     Data Review:    CBC Recent Labs  Lab 08/25/17 1407 08/25/17 1432  WBC 7.5  --   HGB 9.5* 10.2*  HCT 30.1* 30.0*  PLT 166  --   MCV 88.5  --   MCH 27.9  --   MCHC 31.6  --   RDW 15.9*  --   LYMPHSABS 1.8  --   MONOABS 0.6  --   EOSABS 0.3  --   BASOSABS 0.0  --    ------------------------------------------------------------------------------------------------------------------  Chemistries  Recent Labs  Lab 08/25/17 1407 08/25/17 1432  NA 134* 137  K 3.7 3.7  CL 101 99*  CO2 23  --   GLUCOSE 154* 145*  BUN 33* 35*  CREATININE 12.61* 12.50*  CALCIUM 7.4*  --     ------------------------------------------------------------------------------------------------------------------ CrCl cannot be calculated (Unknown ideal weight.). ------------------------------------------------------------------------------------------------------------------ No results for input(s): TSH, T4TOTAL, T3FREE, THYROIDAB in the last 72 hours.  Invalid input(s): FREET3  Coagulation profile No results for input(s): INR, PROTIME in the last 168 hours. ------------------------------------------------------------------------------------------------------------------- No results for input(s): DDIMER in the last 72 hours. -------------------------------------------------------------------------------------------------------------------  Cardiac Enzymes No results for input(s): CKMB, TROPONINI, MYOGLOBIN in the last 168 hours.  Invalid input(s): CK ------------------------------------------------------------------------------------------------------------------    Component Value Date/Time   BNP 461.9 (H) 10/31/2013 1208     ---------------------------------------------------------------------------------------------------------------  Urinalysis    Component Value Date/Time   COLORURINE YELLOW 12/30/2012 0241   APPEARANCEUR CLEAR 12/30/2012 0241   LABSPEC 1.010 12/30/2012 0241   PHURINE 6.5 12/30/2012 0241   GLUCOSEU NEGATIVE 12/30/2012 0241   HGBUR SMALL (A) 12/30/2012 0241   BILIRUBINUR NEGATIVE 12/30/2012 0241   KETONESUR NEGATIVE 12/30/2012 0241   PROTEINUR 100 (A) 12/30/2012 0241   UROBILINOGEN 1.0 12/30/2012 0241   NITRITE NEGATIVE 12/30/2012 0241   LEUKOCYTESUR NEGATIVE 12/30/2012 0241    ----------------------------------------------------------------------------------------------------------------   Imaging Results:    Ct Head Wo Contrast  Result Date: 08/25/2017 CLINICAL DATA:  [age]/o M; found obtunded with hypoxia. Altered level of  consciousness. EXAM: CT HEAD WITHOUT CONTRAST TECHNIQUE: Contiguous axial images were obtained from the base of the skull through the vertex without intravenous contrast. COMPARISON:  11/22/2016 CT head.  11/23/2016 MRI head. FINDINGS: Brain: No evidence of acute infarction, hemorrhage, hydrocephalus, extra-axial collection or mass lesion/mass effect. 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 Vascular: No hyperdense vessel or unexpected calcification. Skull: Normal. Negative for fracture or focal lesion. Sinuses/Orbits: Interval worsening paranasal sinus disease  with near complete opacification of frontal, anterior ethmoid, right posterior ethmoid, and maxillary sinuses. Increased attenuation of sinus secretions may represent inspissation or fungal elements. Normal aeration of mastoid air cells. Bilateral intra-ocular lens replacement. Other: None. IMPRESSION: 1. No acute intracranial abnormality identified. 2. Stable chronic microvascular ischemic changes and parenchymal volume loss of the brain given differences in technique. 3. Progression of severe paranasal sinus disease with near complete opacification of frontal, maxillary, and ethmoid sinuses. Electronically Signed   By: Kristine Garbe M.D.   On: 08/25/2017 20:23   Dg Chest Port 1 View  Result Date: 08/25/2017 CLINICAL DATA:  Shortness of breath. EXAM: PORTABLE CHEST 1 VIEW COMPARISON:  08/02/2017 FINDINGS: Right IJ central venous catheter with tip over the right atrium without significant change. Lungs are adequately inflated demonstrate worsening hazy opacification over the left base likely small effusion with atelectasis although infection is possible. There is cardiomegaly. Remainder the exam is unchanged. IMPRESSION: Worsening left base opacification likely effusion with atelectasis although infection is possible. Cardiomegaly. Right IJ central  venous catheter unchanged with tip over the right atrium. Electronically Signed   By: Marin Olp M.D.   On: 08/25/2017 14:23       Assessment & Plan:    Principal Problem:   Altered mental status Active Problems:   ESRD on dialysis ([place])   Type II diabetes mellitus with manifestations ([place])   Uremia    AMS most likely due to uremia.  CT brain as above Check b12, folate, esr, ana, rpr, tsh  ESRD on HD Appreciate renal consult  Dm2 fsbs ac and qhs, ISS Cont levemir  Hypertension Cont norvasc Cont carvedilol  Fever ? Acute Pneumonia(hcap)  LLL w ? Pleural effusion Blood culture x2 Sputum culture Urine strep antigen,  Urine legionella antigen Influenza Respiratory pathogen panel Start vanco iv , levaquin iv pharmacy to dose Repeat CXR in am   DVT Prophylaxis Heparin  - SCD  AM Labs Ordered, also please review Full Orders  Family Communication: Admission, patients condition and plan of care including tests being ordered have been discussed with the patient who indicate understanding and agree with the plan and Code Status.  Code Status  FULL CODE  Likely DC to  home  Condition GUARDED    Consults called: nephrology by ED  Admission status: inpatient  Time spent in minutes : 45   Jani Gravel M.D on 08/25/2017 at 8:32 PM  Between 7am to 7pm - Pager - [phone]  . After 7pm go to [email or web address] - password [facility]  [facility] - Office  [phone]

## 2017-08-26 ENCOUNTER — Inpatient Hospital Stay (HOSPITAL_COMMUNITY): Payer: Medicare Other

## 2017-08-26 DIAGNOSIS — N186 End stage renal disease: Secondary | ICD-10-CM

## 2017-08-26 DIAGNOSIS — Z992 Dependence on renal dialysis: Secondary | ICD-10-CM

## 2017-08-26 DIAGNOSIS — R600 Localized edema: Secondary | ICD-10-CM

## 2017-08-26 DIAGNOSIS — R4182 Altered mental status, unspecified: Secondary | ICD-10-CM

## 2017-08-26 DIAGNOSIS — R0902 Hypoxemia: Secondary | ICD-10-CM

## 2017-08-26 DIAGNOSIS — Z794 Long term (current) use of insulin: Secondary | ICD-10-CM

## 2017-08-26 DIAGNOSIS — N19 Unspecified kidney failure: Secondary | ICD-10-CM

## 2017-08-26 DIAGNOSIS — E118 Type 2 diabetes mellitus with unspecified complications: Secondary | ICD-10-CM

## 2017-08-26 LAB — RENAL FUNCTION PANEL
Albumin: 1.9 g/dL — ABNORMAL LOW (ref 3.5–5.0)
Albumin: 2 g/dL — ABNORMAL LOW (ref 3.5–5.0)
Anion gap: 10 (ref 5–15)
Anion gap: 11 (ref 5–15)
BUN: 14 mg/dL (ref 6–20)
BUN: 14 mg/dL (ref 6–20)
CO2: 24 mmol/L (ref 22–32)
CO2: 24 mmol/L (ref 22–32)
Calcium: 7.3 mg/dL — ABNORMAL LOW (ref 8.9–10.3)
Calcium: 7.4 mg/dL — ABNORMAL LOW (ref 8.9–10.3)
Chloride: 102 mmol/L (ref 101–111)
Chloride: 102 mmol/L (ref 101–111)
Creatinine, Ser: 6.73 mg/dL — ABNORMAL HIGH (ref 0.61–1.24)
Creatinine, Ser: 6.77 mg/dL — ABNORMAL HIGH (ref 0.61–1.24)
GFR calc Af Amer: 10 mL/min — ABNORMAL LOW (ref >60)
GFR calc Af Amer: 10 mL/min — ABNORMAL LOW (ref >60)
GFR calc non Af Amer: 9 mL/min — ABNORMAL LOW (ref >60)
GFR calc non Af Amer: 9 mL/min — ABNORMAL LOW (ref >60)
Glucose, Bld: 102 mg/dL — ABNORMAL HIGH (ref 65–99)
Glucose, Bld: 102 mg/dL — ABNORMAL HIGH (ref 65–99)
Phosphorus: 3.2 mg/dL (ref 2.5–4.6)
Phosphorus: 3.3 mg/dL (ref 2.5–4.6)
Potassium: 3.5 mmol/L (ref 3.5–5.1)
Potassium: 3.7 mmol/L (ref 3.5–5.1)
Sodium: 136 mmol/L (ref 135–145)
Sodium: 137 mmol/L (ref 135–145)

## 2017-08-26 LAB — SEDIMENTATION RATE: SED RATE: 80 mm/h — AB (ref 0–16)

## 2017-08-26 LAB — RESPIRATORY PANEL BY PCR
Adenovirus: NOT DETECTED
Bordetella pertussis: NOT DETECTED
CHLAMYDOPHILA PNEUMONIAE-RVPPCR: NOT DETECTED
CORONAVIRUS OC43-RVPPCR: NOT DETECTED
Coronavirus 229E: NOT DETECTED
Coronavirus HKU1: NOT DETECTED
Coronavirus NL63: NOT DETECTED
INFLUENZA A-RVPPCR: NOT DETECTED
INFLUENZA B-RVPPCR: NOT DETECTED
MYCOPLASMA PNEUMONIAE-RVPPCR: NOT DETECTED
Metapneumovirus: NOT DETECTED
PARAINFLUENZA VIRUS 4-RVPPCR: NOT DETECTED
Parainfluenza Virus 1: NOT DETECTED
Parainfluenza Virus 2: NOT DETECTED
Parainfluenza Virus 3: NOT DETECTED
RESPIRATORY SYNCYTIAL VIRUS-RVPPCR: NOT DETECTED
Rhinovirus / Enterovirus: NOT DETECTED

## 2017-08-26 LAB — GLUCOSE, CAPILLARY: Glucose-Capillary: 108 mg/dL — ABNORMAL HIGH (ref 65–99)

## 2017-08-26 LAB — CBC
HCT: 31.8 % — ABNORMAL LOW (ref 39.0–52.0)
Hemoglobin: 10.1 g/dL — ABNORMAL LOW (ref 13.0–17.0)
MCH: 27.7 pg (ref 26.0–34.0)
MCHC: 31.8 g/dL (ref 30.0–36.0)
MCV: 87.1 fL (ref 78.0–100.0)
Platelets: 144 K/uL — ABNORMAL LOW (ref 150–400)
RBC: 3.65 MIL/uL — ABNORMAL LOW (ref 4.22–5.81)
RDW: 15.4 % (ref 11.5–15.5)
WBC: 6.2 K/uL (ref 4.0–10.5)

## 2017-08-26 LAB — CREATININE, SERUM
CREATININE: 6.8 mg/dL — AB (ref 0.61–1.24)
GFR, EST AFRICAN AMERICAN: 10 mL/min — AB (ref >60)
GFR, EST NON AFRICAN AMERICAN: 8 mL/min — AB (ref >60)

## 2017-08-26 LAB — INFLUENZA PANEL BY PCR (TYPE A & B)
Influenza A By PCR: NEGATIVE
Influenza B By PCR: NEGATIVE

## 2017-08-26 MED ORDER — CARVEDILOL 25 MG PO TABS
25.0000 mg | ORAL_TABLET | Freq: Two times a day (BID) | ORAL | Status: DC
  Administered 2017-08-26 – 2017-08-27 (×3): 25 mg via ORAL
  Filled 2017-08-26 (×3): qty 1

## 2017-08-26 MED ORDER — INSULIN DETEMIR 100 UNIT/ML ~~LOC~~ SOLN
5.0000 [IU] | Freq: Every day | SUBCUTANEOUS | Status: DC
  Administered 2017-08-26 – 2017-08-27 (×2): 5 [IU] via SUBCUTANEOUS
  Filled 2017-08-26 (×2): qty 0.05

## 2017-08-26 MED ORDER — VANCOMYCIN HCL IN DEXTROSE 1-5 GM/200ML-% IV SOLN
1000.0000 mg | Freq: Once | INTRAVENOUS | Status: AC
  Administered 2017-08-26: 1000 mg via INTRAVENOUS
  Filled 2017-08-26: qty 200

## 2017-08-26 MED ORDER — SODIUM CHLORIDE 0.9% FLUSH
3.0000 mL | Freq: Two times a day (BID) | INTRAVENOUS | Status: DC
  Administered 2017-08-26 – 2017-08-27 (×4): 3 mL via INTRAVENOUS

## 2017-08-26 MED ORDER — AMLODIPINE BESYLATE 5 MG PO TABS
5.0000 mg | ORAL_TABLET | Freq: Every day | ORAL | Status: DC
  Administered 2017-08-26: 5 mg via ORAL
  Filled 2017-08-26: qty 1

## 2017-08-26 MED ORDER — SUCROFERRIC OXYHYDROXIDE 500 MG PO CHEW
500.0000 mg | CHEWABLE_TABLET | Freq: Three times a day (TID) | ORAL | Status: DC
  Administered 2017-08-26 – 2017-08-27 (×2): 500 mg via ORAL
  Filled 2017-08-26 (×5): qty 1

## 2017-08-26 MED ORDER — CHLORHEXIDINE GLUCONATE CLOTH 2 % EX PADS: 6.0000 | MEDICATED_PAD | Freq: Every day | CUTANEOUS | Status: DC

## 2017-08-26 MED ORDER — SODIUM CHLORIDE 0.9 % IV SOLN: 250.0000 mL | INTRAVENOUS | Status: DC | PRN

## 2017-08-26 MED ORDER — RENA-VITE PO TABS
1.0000 | ORAL_TABLET | Freq: Every morning | ORAL | Status: DC
  Administered 2017-08-26 – 2017-08-27 (×2): 1 via ORAL
  Filled 2017-08-26 (×2): qty 1

## 2017-08-26 MED ORDER — HEPARIN SODIUM (PORCINE) 5000 UNIT/ML IJ SOLN
5000.0000 [IU] | Freq: Three times a day (TID) | INTRAMUSCULAR | Status: DC
  Administered 2017-08-26 – 2017-08-27 (×3): 5000 [IU] via SUBCUTANEOUS
  Filled 2017-08-26 (×3): qty 1

## 2017-08-26 MED ORDER — SODIUM CHLORIDE 0.9% FLUSH: 3.0000 mL | INTRAVENOUS | Status: DC | PRN

## 2017-08-26 MED ORDER — ACETAMINOPHEN 650 MG RE SUPP: 650.0000 mg | Freq: Four times a day (QID) | RECTAL | Status: DC | PRN

## 2017-08-26 MED ORDER — CALCITRIOL 0.25 MCG PO CAPS
0.2500 ug | ORAL_CAPSULE | Freq: Three times a day (TID) | ORAL | Status: DC
  Administered 2017-08-26 – 2017-08-27 (×3): 0.25 ug via ORAL
  Filled 2017-08-26 (×3): qty 1

## 2017-08-26 MED ORDER — INSULIN ASPART 100 UNIT/ML ~~LOC~~ SOLN: 0.0000 [IU] | Freq: Three times a day (TID) | SUBCUTANEOUS | Status: DC

## 2017-08-26 MED ORDER — VANCOMYCIN HCL IN DEXTROSE 1-5 GM/200ML-% IV SOLN
INTRAVENOUS | Status: AC
  Administered 2017-08-26: 1000 mg via INTRAVENOUS
  Filled 2017-08-26: qty 200

## 2017-08-26 MED ORDER — ACETAMINOPHEN 325 MG PO TABS: 650.0000 mg | ORAL_TABLET | Freq: Four times a day (QID) | ORAL | Status: DC | PRN

## 2017-08-26 MED ORDER — MUPIROCIN 2 % EX OINT
1.0000 "application " | TOPICAL_OINTMENT | Freq: Two times a day (BID) | CUTANEOUS | Status: DC
  Administered 2017-08-26 – 2017-08-27 (×3): 1 via NASAL
  Filled 2017-08-26 (×2): qty 22

## 2017-08-26 NOTE — Progress Notes (Signed)
Patient arrived to unit from hemodialysis, spouse at bedside. Patient has no complaints or concerns. Updated on plan of care. Droplet/contact precautions initiated. Safety measures in place and call bell within reach.

## 2017-08-26 NOTE — Progress Notes (Signed)
[facility] Progress Note  Subjective: had 4 hr HD last night, 5 L removed, feeling better today and wife says his mentation is back to normal.    Vitals:   08/26/17 0237 08/26/17 0238 08/26/17 0603 08/26/17 1032  BP: 135/62  (!) 128/59 (!) 153/60  Pulse: 81  77 78  Resp: 16  18   Temp: 98.5 F (36.9 C)  98.9 F (37.2 C)   TempSrc: Oral  Oral   SpO2: 99% 92% 95% 91%  Weight:        Inpatient medications: . amLODipine  5 mg Oral Daily  . calcitRIOL  0.25 mcg Oral TID  . carvedilol  25 mg Oral BID WC  . Chlorhexidine Gluconate Cloth  6 each Topical Q0600  . heparin  5,000 Units Subcutaneous Q8H  . insulin detemir  5 Units Subcutaneous Daily  . multivitamin  1 tablet Oral q morning - 10a  . mupirocin ointment  1 application Nasal BID  . sodium chloride flush  3 mL Intravenous Q12H  . sucroferric oxyhydroxide  500 mg Oral TID WC   . sodium chloride    . sodium chloride    . sodium chloride    . [START ON 08/27/2017] levofloxacin (LEVAQUIN) IV    . vancomycin     sodium chloride, sodium chloride, sodium chloride, acetaminophen **OR** acetaminophen, lidocaine (PF), lidocaine-prilocaine, pentafluoroprop-tetrafluoroeth, sodium chloride flush  Exam: Alert, nasal O2 No jvd Chest occ crackles at bases, mostly clear RRR no mrg Abd soft ntnd +BS  , PD cath clean exit Ext left forearm AVF, minimally matured +bruit R arm 2-3+ nonpitting edema throughout the arm R IJ TDC clean exit NF, Ox 3  Dialysis: TTS [facility] (also on PD) 4h 70min  450/800  86kg  2/2.25 bath  Hep 2600 RIJ TDC / maturing L RC AVG  BMD meds: -Velphoro 500 mg PO TID AC       Impression: 1  ESRD w uremia/ AMS - improving, ?PD failure after peritonitis episode 2  Vol overload still up 6kg 3  HTN cont coreg, will dc norvasc for now 4  PD related peritonitis - recent, cx neg, rx'd 3 wks IP vanc finished 2/19 5  Anemia ckd, stable, not on esa at center 6  DM1 7  R arm edema (w ipsilat TDC) -  calling [facility] 8  L arm poorly functioning L RC AV fistula - placed > 1 yr ago, may need new access as PD failure is a real possibility now - calling [facility]  Plan - HD again today and tomorrow   Kelly Splinter MD [facility] pager [phone]   08/26/2017, 1:04 PM   Recent Labs  Lab 08/25/17 1407 08/25/17 1432 08/26/17 0218  NA 134* 137 137  136  K 3.7 3.7 3.5  3.7  CL 101 99* 102  102  CO2 23  --  24  24  GLUCOSE 154* 145* 102*  102*  BUN 33* 35* 14  14  CREATININE 12.61* 12.50* 6.73*  6.77*  6.80*  CALCIUM 7.4*  --  7.4*  7.3*  PHOS 6.1*  --  3.2  3.3   Recent Labs  Lab 08/25/17 1407 08/26/17 0218  ALBUMIN 1.8* 1.9*  2.0*   Recent Labs  Lab 08/25/17 1407 08/25/17 1432 08/26/17 0218  WBC 7.5  --  6.2  NEUTROABS 4.9  --   --   HGB 9.5* 10.2* 10.1*  HCT 30.1* 30.0* 31.8*  MCV 88.5  --  87.1  PLT 166  --  144*   Iron/TIBC/Ferritin/ %Sat    Component Value Date/Time   IRON 9 (L) 08/03/2017 0033   TIBC NOT CALCULATED 08/03/2017 0033   FERRITIN 1,275 (H) 08/03/2017 0033   IRONPCTSAT NOT CALCULATED 08/03/2017 0033

## 2017-08-26 NOTE — Progress Notes (Signed)
Pharmacy Antibiotic Note  Bryan Wilkerson is a 52 y.o. male admitted on 08/25/2017 with pneumonia.  Pharmacy has been consulted for vancomycin and Levaquin dosing. Vancomycin load given at end of HD session overnight. Per Renal notes, planning serial HD, next for today. Afebrile, WBC wnl.  Plan: Vancomycin 1g IV post-HD today - then f/u Renal plans for HD schedule for further doses Levaquin 750mg  IV x 1, then 500mg  IV Q48h Monitor clinical picture, pre-HD VR as indicated F/U C&S, abx deescalation / LOT  Weight: 203 lb 12.8 oz (92.4 kg)  Temp (24hrs), Avg:99.1 F (37.3 C), Min:97.7 F (36.5 C), Max:100.3 F (37.9 C)  Recent Labs  Lab 08/25/17 1407 08/25/17 1432 08/26/17 0218  WBC 7.5  --  6.2  CREATININE 12.61* 12.50* 6.73*  6.77*  6.80*    Estimated Creatinine Clearance: 15.7 mL/min (A) (by C-G formula based on SCr of 6.73 mg/dL (H)).    Allergies  Allergen Reactions  . Penicillins Other (See Comments)    UNSPECIFIED REACTION FROM CHILDHOOD Has patient had a PCN reaction causing immediate rash, facial/tongue/throat swelling, SOB or lightheadedness with hypotension:Yes Has patient had a PCN reaction causing severe rash involving mucus membranes or skin necrosis:No Has patient had a PCN reaction that required hospitalization:Yes Has patient had a PCN reaction occurring within the last 10 years:No If all of the above answers are "NO", then may proceed with Cephalosporin use.      Elicia Lamp, PharmD, BCPS Clinical Pharmacist Clinical phone for 08/26/2017 until 3:30pm: [phone] If after 3:30pm, please call main pharmacy at: [phone] 08/26/2017 10:21 AM

## 2017-08-26 NOTE — Progress Notes (Signed)
Patient arrived to unit by bed.  Reviewed treatment plan and this RN agrees with plan.  Report received from bedside RN, Lamelva.  Consent obtained.  Patient A & O X 4.   Lung sounds diminished to ausculation in all fields. Generalized 2+ pitting edema. Cardiac:  NSR.  Removed caps and cleansed RIJ catheter with chlorhedxidine.  Aspirated ports of heparin and flushed them with saline per protocol.  Connected and secured lines, initiated treatment at 1611.  UF Goal of 5500 mL: and net fluid removal 5 L.  Will continue to monitor.   Pt refused to allow HD RN to access fistula.

## 2017-08-26 NOTE — Progress Notes (Deleted)
Patient arrived to unit by bed.  Reviewed treatment plan and this RN agrees with plan.  Report received from bedside RN, Lamelva.  Consent obtained.  Patient A & O X 4.   Lung sounds diminished to ausculation in all fields. Generalized 2+ pitting edema. Cardiac:  NSR.  Removed caps and cleansed RIJ catheter with chlorhedxidine.  Aspirated ports of heparin and flushed them with saline per protocol.  Connected and secured lines, initiated treatment at 1611.  UF Goal of 5500 mL: and net fluid removal 5 L.  Will continue to monitor.

## 2017-08-26 NOTE — Plan of Care (Signed)
  Health Behavior/Discharge Planning: Ability to manage health-related needs will improve 08/26/2017 0401 - Progressing by Tristan Schroeder, RN   Education: Knowledge of General Education information will improve 08/26/2017 0401 - Progressing by Tristan Schroeder, RN

## 2017-08-26 NOTE — Progress Notes (Signed)
Received pt from dialysis. AAOx4. Tele on. VS taken.SR up x3  Bed alarm on for safety

## 2017-08-26 NOTE — Progress Notes (Signed)
HD tx completed @ 0100 w/o problem, UF goal met, blood rinsed back. Found large clots in both Art and Ven chamber when rinsing back. Question if pt could have more Heparin during HD tx if appropriate? Report called to Magdalen Spatz, RN

## 2017-08-26 NOTE — Progress Notes (Signed)
Dialysis treatment completed.  5500 mL ultrafiltrated.  5000 mL net fluid removal.  Patient status unchanged. Lung sounds diminished to ausculation in all fields. RUE 3+ pitting edema. Cardiac: NSR.  Cleansed RIJ catheter with chlorhexidine.  Disconnected lines and flushed ports with saline per protocol.  Ports locked with heparin and capped per protocol.    Report given to bedside, RN Ronnie.

## 2017-08-26 NOTE — Consult Note (Addendum)
Hospital Consult    Reason for Consult:  Edema RUE, ESRD Requesting Physician:  Dr. Jonnie Finner MRN #:  [id number]  History of Present Illness: This is a 52 y.o. male with PMH significant for CHF, insulin dependent diabetes mellitus, and ESRD on dialysis.  Patient has been maintained on PD however was brought by EMS to [facility] due to uremia and volume overload.  He denies any known issues with PD catheter over the past weeks.    He has also developed an edematous R arm.  R arm negative for DVT.  He is currently receiving hemodialysis from [facility].  He believes R arm edema has improved since admission and denies discomfort.  Surgical history significant for previous R arm permanent access which is known to be occluded as well as non-maturing L arm radiocephalic fistula.  Past Medical History:  Diagnosis Date  . Anemia   . CHF (congestive heart failure) ([place])   . Diabetic retinopathy ([place])   . ESRD on peritoneal dialysis ([place])    "7 days/week" (04/20/2017)  . Hypertension   . Pneumonia 2016; 04/19/2017  . Psoriasis   . Type I diabetes mellitus ([place])     Past Surgical History:  Procedure Laterality Date  . AV FISTULA PLACEMENT Right 12/05/2013   Procedure: RADIOCEPHALIC VS. BRACHIOCEPHALIC ARTERIOVENOUS (AV) FISTULA CREATION;  Surgeon: Conrad Hungerford, MD;  Location: [place];  Service: Vascular;  Laterality: Right;  . AV FISTULA PLACEMENT Left 07/20/2016   Procedure: LEFT ARM RADIOCEPHALIC ARTERIOVENOUS (AV) FISTULA CREATION;  Surgeon: Waynetta Sandy, MD;  Location: [place];  Service: Vascular;  Laterality: Left;  . CATARACT EXTRACTION W/ INTRAOCULAR LENS  IMPLANT, BILATERAL Bilateral   . EYE SURGERY    . FISTULOGRAM Right 07/16/2014   Procedure: FISTULOGRAM;  Surgeon: Conrad Rea, MD;  Location: [place];  Service: Vascular;  Laterality: Right;  . IR THORACENTESIS ASP PLEURAL SPACE W/IMG GUIDE  05/02/2017  . LIGATION OF COMPETING BRANCHES OF ARTERIOVENOUS FISTULA Right 07/16/2014   Procedure: LIGATION OF COMPETING BRANCHES OF ARTERIOVENOUS FISTULA;  Surgeon: Conrad Cheyenne Wells, MD;  Location: [place];  Service: Vascular;  Laterality: Right;  . PERITONEAL CATHETER INSERTION Left ~ 08/2016  . PORT-A-CATH REMOVAL  2017  . PORTA CATH INSERTION Right    "for hemodialysis"  . RETINAL DETACHMENT SURGERY Right     Allergies  Allergen Reactions  . Penicillins Other (See Comments)    UNSPECIFIED REACTION FROM CHILDHOOD Has patient had a PCN reaction causing immediate rash, facial/tongue/throat swelling, SOB or lightheadedness with hypotension:Yes Has patient had a PCN reaction causing severe rash involving mucus membranes or skin necrosis:No Has patient had a PCN reaction that required hospitalization:Yes Has patient had a PCN reaction occurring within the last 10 years:No If all of the above answers are "NO", then may proceed with Cephalosporin use.      Prior to Admission medications   Medication Sig Start Date End Date Taking? Authorizing Provider  amLODipine (NORVASC) 5 MG tablet Take 5 mg by mouth daily.  07/19/17  Yes [provider]  B Complex-C-Folic Acid (NEPHRO-VITE PO) Take 1 tablet by mouth every morning.   Yes [provider]  calcitRIOL (ROCALTROL) 0.25 MCG capsule Take 2 capsules (0.5 mcg total) by mouth daily. Patient taking differently: Take 0.25 mcg by mouth 3 (three) times daily.  04/22/17  Yes Hongalgi, Lenis Dickinson, MD  carvedilol (COREG) 6.25 MG tablet Take 1 tablet (6.25 mg total) by mouth 2 (two) times daily with a meal.  Patient taking differently: Take 25 mg by mouth 2 (two) times daily with a meal.  11/27/16  Yes Hosie Poisson, MD  NOVOLOG FLEXPEN 100 UNIT/ML FlexPen Sliding scale over  150-315 1 units 08/24/17  Yes [provider]  sucroferric oxyhydroxide (VELPHORO) 500 MG chewable tablet Chew 500 mg by mouth 3 (three) times daily with meals.   Yes [provider]  triamcinolone ointment (KENALOG) 0.1 % Apply 1 application  topically as needed (psorasis).  10/10/13  Yes [provider]  LEVEMIR FLEXTOUCH 100 UNIT/ML Pen Inject 2-8 Units into the skin daily at 10 pm. Sliding Scale 05/30/17   [provider]    Social History   Socioeconomic History  . Marital status: Married    Spouse name: Not on file  . Number of children: Not on file  . Years of education: Not on file  . Highest education level: Not on file  Social Needs  . Financial resource strain: Not on file  . Food insecurity - worry: Not on file  . Food insecurity - inability: Not on file  . Transportation needs - medical: Not on file  . Transportation needs - non-medical: Not on file  Occupational History  . Occupation: logistics  Tobacco Use  . Smoking status: Never Smoker  . Smokeless tobacco: Never Used  Substance and Sexual Activity  . Alcohol use: No    Alcohol/week: 0.0 oz  . Drug use: No  . Sexual activity: Yes  Other Topics Concern  . Not on file  Social History Narrative   [facility] (04/22/17):   Lives with wife. Does have an indoor dog. No bird or mold exposure. No recent travel. He works as a Counsellor.     Family History  Problem Relation Age of Onset  . Hypertension Mother   . Heart attack Mother   . Chronic Renal Failure Neg Hx   . Diabetes Neg Hx   . Stroke Neg Hx   . Cancer Neg Hx     ROS: Otherwise negative unless mentioned in HPI  Physical Examination  Vitals:   08/26/17 0603 08/26/17 1032  BP: (!) 128/59 (!) 153/60  Pulse: 77 78  Resp: 18   Temp: 98.9 F (37.2 C)   SpO2: 95% 91%   Body mass index is 25.47 kg/m.  General:  WDWN in NAD Gait: Not observed HENT: WNL, normocephalic Pulmonary: normal non-labored breathing, without Rales, rhonchi,  wheezing Cardiac: regular Abdomen:  soft, NT/ND, no masses; unremarkable PD cath site Skin: without rashes Vascular Exam/Pulses: palpable thrill and audible bruit L radiocephalic fistula to level of mid forearm Extremities:  edema R arm compared to L; unremarkable R TDC site Musculoskeletal: no muscle wasting or atrophy  Neurologic: A&O X 3;  No focal weakness or paresthesias are detected; speech is fluent/normal Psychiatric:  The pt has Normal affect. Lymph:  Unremarkable  CBC    Component Value Date/Time   WBC 6.2 08/26/2017 0218   RBC 3.65 (L) 08/26/2017 0218   HGB 10.1 (L) 08/26/2017 0218   HCT 31.8 (L) 08/26/2017 0218   PLT 144 (L) 08/26/2017 0218   MCV 87.1 08/26/2017 0218   MCH 27.7 08/26/2017 0218   MCHC 31.8 08/26/2017 0218   RDW 15.4 08/26/2017 0218   LYMPHSABS 1.8 08/25/2017 1407   MONOABS 0.6 08/25/2017 1407   EOSABS 0.3 08/25/2017 1407   BASOSABS 0.0 08/25/2017 1407    BMET    Component Value Date/Time   NA 137 08/26/2017 0218   NA  136 08/26/2017 0218   K 3.5 08/26/2017 0218   K 3.7 08/26/2017 0218   CL 102 08/26/2017 0218   CL 102 08/26/2017 0218   CO2 24 08/26/2017 0218   CO2 24 08/26/2017 0218   GLUCOSE 102 (H) 08/26/2017 0218   GLUCOSE 102 (H) 08/26/2017 0218   BUN 14 08/26/2017 0218   BUN 14 08/26/2017 0218   CREATININE 6.73 (H) 08/26/2017 0218   CREATININE 6.77 (H) 08/26/2017 0218   CREATININE 6.80 (H) 08/26/2017 0218   CREATININE 7.03 (H) 10/31/2013 1208   CALCIUM 7.4 (L) 08/26/2017 0218   CALCIUM 7.3 (L) 08/26/2017 0218   CALCIUM 7.4 (L) 03/01/2014 0944   GFRNONAA 9 (L) 08/26/2017 0218   GFRNONAA 9 (L) 08/26/2017 0218   GFRNONAA 8 (L) 08/26/2017 0218   GFRAA 10 (L) 08/26/2017 0218   GFRAA 10 (L) 08/26/2017 0218   GFRAA 10 (L) 08/26/2017 0218    COAGS: Lab Results  Component Value Date   INR 1.46 08/03/2017   INR 1.05 12/29/2012     Non-Invasive Vascular Imaging:   Negative for R arm DVT  Statin:  No. Beta Blocker:  Yes.   Aspirin:  No. ACEI:  No. ARB:  No. CCB use:  Yes Other antiplatelets/anticoagulants:  No.    ASSESSMENT/PLAN: This is a 52 y.o. male with ESRD dialyzing from R IJ TDC usually maintained with peritoneal dialysis  [place] negative  for RUE DVT; per patient R arm edema improving since starting HD this admission  PD catheter functioning properly per patient Non-maturing L radiocephalic; can potentially salvage with outpatient fistulogram at some point Continue HD from R IJ [facility] for now; recommended elevation of R arm and exercising R hand    Dagoberto Ligas PA-C [facility] [phone]  I have independently interviewed and examined patient and agree with PA assessment and plan above. Will benefit from fistulogram of left radiocephalic avf for either intervention to plan left upper arm avf and can also evaluate central veins as probable cause of right arm swelling. If he is here Monday we can potentially do it then although he has indicated he would have procedure as outpatient which can also be arranged.   Jimena Wieczorek C. Donzetta Matters, MD [facility] [place] Office: [phone] Pager: [phone]

## 2017-08-26 NOTE — Plan of Care (Signed)
  Education: Knowledge of General Education information will improve 08/26/2017 1707 - Progressing by Rolm Baptise, RN   Education: Knowledge of General Education information will improve 08/26/2017 1707 - Progressing by Rolm Baptise, RN   Health Behavior/Discharge Planning: Ability to manage health-related needs will improve 08/26/2017 1707 - Progressing by Rolm Baptise, RN   Activity: Risk for activity intolerance will decrease 08/26/2017 1707 - Progressing by Rolm Baptise, RN   Nutrition: Adequate nutrition will be maintained 08/26/2017 1707 - Progressing by Rolm Baptise, RN   Coping: Level of anxiety will decrease 08/26/2017 1707 - Progressing by Rolm Baptise, RN   Elimination: Will not experience complications related to bowel motility 08/26/2017 1707 - Not Met (add Reason) by Rolm Baptise, RN Note Patient is anuric

## 2017-08-26 NOTE — Plan of Care (Signed)
  Health Behavior/Discharge Planning: Ability to manage health-related needs will improve 08/26/2017 2309 - Progressing by Trust Leh, Roma Kayser, RN   Clinical Measurements: Diagnostic test results will improve 08/26/2017 2309 - Progressing by Eryk Beavers A, RN   Activity: Risk for activity intolerance will decrease 08/26/2017 2309 - Progressing by Annakate Soulier A, RN   Nutrition: Adequate nutrition will be maintained 08/26/2017 2309 - Progressing by Jaretzi Droz, Roma Kayser, RN

## 2017-08-26 NOTE — Progress Notes (Signed)
Patient has generalized edema, dark, ashen skin. Patient has old AFV in right arm that is no longer patent, also has left AVF that he states "is still maturing"- present bruit/thrill. Patient has PD catheter on left abdomen that is secured in a waistband, also has HD catheter on right side of chest that was used for hemodialysis tonight. Also has an right AC peripheral IV. Skin assessment done with 2nd RN- Danae Chen.

## 2017-08-26 NOTE — Progress Notes (Signed)
PROGRESS NOTE    Bryan Wilkerson  VZC:[id number] DOB: 10-15-1965 DOA: 08/25/2017 PCP: [name], MD  Brief Narrative: Mr. Bryan Wilkerson is a 52 year old male with ESRD, type telephone diabetes, anemia of chronic disease, was recently hospitalized with culture-negative peritonitis related to PD catheter, he was treated with 3 weeks of intraperitoneal vancomycin which was completed on 2/19. -Just transitioned over to hemodialysis as outpatient was brought to the emergency room due to worsening confusion, lethargy and swelling, found to be febrile in the emergency room and started on broad-spectrum antibiotics   Assessment & Plan:   1. Metabolic encephalopathy -Secondary to uremia, improving with HD -Felt to have PD failure after peritonitis episode just transitioned to outpatient hemodialysis -Status post HD per renal last night, mentation improving, almost back to baseline -Also started on broad-spectrum antibiotics for low-grade fever 2/21, follow-up blood cultures  2. Fever -Etiology not clear at this time -Resolved, currently getting broad-spectrum antibiotics, follow-up blood cultures, he has an HD catheter -Also just completed a three-week course of intraperitoneal vancomycin for culture-negative catheter related peritonitis on 2/19  3. Type 1 diabetes mellitus -Continue Levemir, sliding scale insulin for now  4. Anemia of chronic disease -Epo with HD  5 ESRD -Now on hemodialysis as noted above -Per renal  6. Hypertension -Continue Coreg, Norvasc discontinued  7. Access issues/. Poorly Functioning AV fistula 1 year -per [facility]VS/Renal  DVT prophylaxis: Hep SQ Code Status: Full Code Family Communication:Wife at bedside Disposition Plan: Home  pending clinical improvement   Consultants:   Renal   Procedures:   Antimicrobials:  Antibiotics Given (last 72 hours)    Date/Time Action Medication Dose Rate   08/26/17 0000 New Bag/Given   vancomycin (VANCOCIN)  2,000 mg in sodium chloride 0.9 % 500 mL IVPB 2,000 mg 250 mL/hr   08/26/17 0310 New Bag/Given   levofloxacin (LEVAQUIN) IVPB 750 mg 750 mg 100 mL/hr      Subjective: -feels better, mentation back to baseline  Objective: Vitals:   08/26/17 0237 08/26/17 0238 08/26/17 0603 08/26/17 1032  BP: 135/62  (!) 128/59 (!) 153/60  Pulse: 81  77 78  Resp: 16  18   Temp: 98.5 F (36.9 C)  98.9 F (37.2 C)   TempSrc: Oral  Oral   SpO2: 99% 92% 95% 91%  Weight:        Intake/Output Summary (Last 24 hours) at 08/26/2017 1408 Last data filed at 08/26/2017 1001 Gross per 24 hour  Intake 980 ml  Output 5001 ml  Net -4021 ml   Filed Weights   08/26/17 0142  Weight: 92.4 kg (203 lb 12.8 oz)    Examination:  General exam: Appears calm and comfortable, no distress Respiratory system: Fine basilar crackles, mostly clear Cardiovascular system: S1 & S2 heard, RRR.  Gastrointestinal system: Abdomen is nondistended, soft and nontender.Normal bowel sounds heard, PD catheter noted, unremarkable appearance Central nervous system: Alert and oriented. No focal neurological deficits. Extremities: Right arm with 2+ edema, left forearm aVF Skin: No rashes, lesions or ulcers Psychiatry: Judgement and insight appear normal. Mood & affect appropriate.     Data Reviewed:   CBC: Recent Labs  Lab 08/25/17 1407 08/25/17 1432 08/26/17 0218  WBC 7.5  --  6.2  NEUTROABS 4.9  --   --   HGB 9.5* 10.2* 10.1*  HCT 30.1* 30.0* 31.8*  MCV 88.5  --  87.1  PLT 166  --  128*   Basic Metabolic Panel: Recent Labs  Lab 08/25/17 1407  08/25/17 1432 08/26/17 0218  NA 134* 137 137  136  K 3.7 3.7 3.5  3.7  CL 101 99* 102  102  CO2 23  --  24  24  GLUCOSE 154* 145* 102*  102*  BUN 33* 35* 14  14  CREATININE 12.61* 12.50* 6.73*  6.77*  6.80*  CALCIUM 7.4*  --  7.4*  7.3*  PHOS 6.1*  --  3.2  3.3   GFR: Estimated Creatinine Clearance: 15.7 mL/min (A) (by C-G formula based on SCr of 6.73 mg/dL  (H)). Liver Function Tests: Recent Labs  Lab 08/25/17 1407 08/26/17 0218  ALBUMIN 1.8* 1.9*  2.0*   No results for input(s): LIPASE, AMYLASE in the last 168 hours. No results for input(s): AMMONIA in the last 168 hours. Coagulation Profile: No results for input(s): INR, PROTIME in the last 168 hours. Cardiac Enzymes: No results for input(s): CKTOTAL, CKMB, CKMBINDEX, TROPONINI in the last 168 hours. BNP (last 3 results) No results for input(s): PROBNP in the last 8760 hours. HbA1C: No results for input(s): HGBA1C in the last 72 hours. CBG: Recent Labs  Lab 08/25/17 1357 08/26/17 0141  GLUCAP 135* 108*   Lipid Profile: No results for input(s): CHOL, HDL, LDLCALC, TRIG, CHOLHDL, LDLDIRECT in the last 72 hours. Thyroid Function Tests: No results for input(s): TSH, T4TOTAL, FREET4, T3FREE, THYROIDAB in the last 72 hours. Anemia Panel: No results for input(s): VITAMINB12, FOLATE, FERRITIN, TIBC, IRON, RETICCTPCT in the last 72 hours. Urine analysis:    Component Value Date/Time   COLORURINE YELLOW 12/30/2012 [place] 12/30/2012 0241   LABSPEC 1.010 12/30/2012 0241   PHURINE 6.5 12/30/2012 0241   GLUCOSEU NEGATIVE 12/30/2012 0241   HGBUR SMALL (A) 12/30/2012 0241   BILIRUBINUR NEGATIVE 12/30/2012 0241   KETONESUR NEGATIVE 12/30/2012 0241   PROTEINUR 100 (A) 12/30/2012 0241   UROBILINOGEN 1.0 12/30/2012 0241   NITRITE NEGATIVE 12/30/2012 0241   LEUKOCYTESUR NEGATIVE 12/30/2012 0241   Sepsis Labs: @LABRCNTIP (procalcitonin:4,lacticidven:4)  ) Recent Results (from the past 240 hour(s))  Respiratory Panel by PCR     Status: None   Collection Time: 08/26/17  1:37 AM  Result Value Ref Range Status   Adenovirus NOT DETECTED NOT DETECTED Final   Coronavirus 229E NOT DETECTED NOT DETECTED Final   Coronavirus HKU1 NOT DETECTED NOT DETECTED Final   Coronavirus NL63 NOT DETECTED NOT DETECTED Final   Coronavirus OC43 NOT DETECTED NOT DETECTED Final    Metapneumovirus NOT DETECTED NOT DETECTED Final   Rhinovirus / Enterovirus NOT DETECTED NOT DETECTED Final   Influenza A NOT DETECTED NOT DETECTED Final   Influenza B NOT DETECTED NOT DETECTED Final   Parainfluenza Virus 1 NOT DETECTED NOT DETECTED Final   Parainfluenza Virus 2 NOT DETECTED NOT DETECTED Final   Parainfluenza Virus 3 NOT DETECTED NOT DETECTED Final   Parainfluenza Virus 4 NOT DETECTED NOT DETECTED Final   Respiratory Syncytial Virus NOT DETECTED NOT DETECTED Final   Bordetella pertussis NOT DETECTED NOT DETECTED Final   Chlamydophila pneumoniae NOT DETECTED NOT DETECTED Final   Mycoplasma pneumoniae NOT DETECTED NOT DETECTED Final    Comment: Performed at [facility], [place] [address], [place], [place] [address]         Radiology Studies: Dg Chest 2 View  Result Date: 08/26/2017 CLINICAL DATA:  Fever EXAM: CHEST  2 VIEW COMPARISON:  08/25/2017 FINDINGS: Right dialysis catheter remains in place, unchanged. Mild cardiomegaly. Platelike density in the lingula, likely atelectasis. Small left pleural  effusion. Right lung clear. IMPRESSION: Lingular atelectasis.  Small left pleural effusion. Mild cardiomegaly. Electronically Signed   By: Rolm Baptise M.D.   On: 08/26/2017 07:33   Ct Head Wo Contrast  Result Date: 08/25/2017 CLINICAL DATA:  52 y/o M; found obtunded with hypoxia. Altered level of consciousness. EXAM: CT HEAD WITHOUT CONTRAST TECHNIQUE: Contiguous axial images were obtained from the base of the skull through the vertex without intravenous contrast. COMPARISON:  11/22/2016 CT head.  11/23/2016 MRI head. FINDINGS: Brain: No evidence of acute infarction, hemorrhage, hydrocephalus, extra-axial collection or mass lesion/mass effect. 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 Vascular: No hyperdense vessel or unexpected calcification. Skull: Normal. Negative for fracture or  focal lesion. Sinuses/Orbits: Interval worsening paranasal sinus disease with near complete opacification of frontal, anterior ethmoid, right posterior ethmoid, and maxillary sinuses. Increased attenuation of sinus secretions may represent inspissation or fungal elements. Normal aeration of mastoid air cells. Bilateral intra-ocular lens replacement. Other: None. IMPRESSION: 1. No acute intracranial abnormality identified. 2. Stable chronic microvascular ischemic changes and parenchymal volume loss of the brain given differences in technique. 3. Progression of severe paranasal sinus disease with near complete opacification of frontal, maxillary, and ethmoid sinuses. Electronically Signed   By: Kristine Garbe M.D.   On: 08/25/2017 20:23   Dg Chest Port 1 View  Result Date: 08/25/2017 CLINICAL DATA:  Shortness of breath. EXAM: PORTABLE CHEST 1 VIEW COMPARISON:  08/02/2017 FINDINGS: Right IJ central venous catheter with tip over the right atrium without significant change. Lungs are adequately inflated demonstrate worsening hazy opacification over the left base likely small effusion with atelectasis although infection is possible. There is cardiomegaly. Remainder the exam is unchanged. IMPRESSION: Worsening left base opacification likely effusion with atelectasis although infection is possible. Cardiomegaly. Right IJ central venous catheter unchanged with tip over the right atrium. Electronically Signed   By: Marin Olp M.D.   On: 08/25/2017 14:23        Scheduled Meds: . calcitRIOL  0.25 mcg Oral TID  . carvedilol  25 mg Oral BID WC  . Chlorhexidine Gluconate Cloth  6 each Topical Q0600  . heparin  5,000 Units Subcutaneous Q8H  . insulin detemir  5 Units Subcutaneous Daily  . multivitamin  1 tablet Oral q morning - 10a  . mupirocin ointment  1 application Nasal BID  . sodium chloride flush  3 mL Intravenous Q12H  . sucroferric oxyhydroxide  500 mg Oral TID WC   Continuous  Infusions: . sodium chloride    . sodium chloride    . sodium chloride    . [START ON 08/27/2017] levofloxacin (LEVAQUIN) IV    . vancomycin       LOS: 1 day    Time spent: 75min    Domenic Polite, MD [facility] Page via [email or web address], password [email or web address] After 7PM please contact night-coverage  08/26/2017, 2:08 PM

## 2017-08-27 ENCOUNTER — Other Ambulatory Visit: Payer: Self-pay

## 2017-08-27 DIAGNOSIS — N186 End stage renal disease: Secondary | ICD-10-CM | POA: Diagnosis not present

## 2017-08-27 DIAGNOSIS — K650 Generalized (acute) peritonitis: Secondary | ICD-10-CM | POA: Diagnosis not present

## 2017-08-27 DIAGNOSIS — K658 Other peritonitis: Secondary | ICD-10-CM | POA: Diagnosis not present

## 2017-08-27 DIAGNOSIS — Z992 Dependence on renal dialysis: Secondary | ICD-10-CM

## 2017-08-27 DIAGNOSIS — E440 Moderate protein-calorie malnutrition: Secondary | ICD-10-CM | POA: Diagnosis not present

## 2017-08-27 DIAGNOSIS — R17 Unspecified jaundice: Secondary | ICD-10-CM | POA: Diagnosis not present

## 2017-08-27 DIAGNOSIS — D509 Iron deficiency anemia, unspecified: Secondary | ICD-10-CM | POA: Diagnosis not present

## 2017-08-27 LAB — COMPREHENSIVE METABOLIC PANEL
ALBUMIN: 1.8 g/dL — AB (ref 3.5–5.0)
ALT: 21 U/L (ref 17–63)
AST: 24 U/L (ref 15–41)
Alkaline Phosphatase: 124 U/L (ref 38–126)
Anion gap: 9 (ref 5–15)
BUN: 10 mg/dL (ref 6–20)
CO2: 27 mmol/L (ref 22–32)
CREATININE: 6.02 mg/dL — AB (ref 0.61–1.24)
Calcium: 7.4 mg/dL — ABNORMAL LOW (ref 8.9–10.3)
Chloride: 98 mmol/L — ABNORMAL LOW (ref 101–111)
GFR calc Af Amer: 11 mL/min — ABNORMAL LOW (ref >60)
GFR calc non Af Amer: 10 mL/min — ABNORMAL LOW (ref >60)
Glucose, Bld: 110 mg/dL — ABNORMAL HIGH (ref 65–99)
POTASSIUM: 2.9 mmol/L — AB (ref 3.5–5.1)
Sodium: 134 mmol/L — ABNORMAL LOW (ref 135–145)
Total Bilirubin: 0.6 mg/dL (ref 0.3–1.2)
Total Protein: 7 g/dL (ref 6.5–8.1)

## 2017-08-27 LAB — CBC
HEMATOCRIT: 31.5 % — AB (ref 39.0–52.0)
Hemoglobin: 9.7 g/dL — ABNORMAL LOW (ref 13.0–17.0)
MCH: 27.2 pg (ref 26.0–34.0)
MCHC: 30.8 g/dL (ref 30.0–36.0)
MCV: 88.2 fL (ref 78.0–100.0)
PLATELETS: 154 10*3/uL (ref 150–400)
RBC: 3.57 MIL/uL — AB (ref 4.22–5.81)
RDW: 15.4 % (ref 11.5–15.5)
WBC: 5.5 10*3/uL (ref 4.0–10.5)

## 2017-08-27 LAB — VITAMIN B12: Vitamin B-12: 1079 pg/mL — ABNORMAL HIGH (ref 180–914)

## 2017-08-27 LAB — TSH: TSH: 3.765 u[IU]/mL (ref 0.350–4.500)

## 2017-08-27 LAB — GLUCOSE, CAPILLARY: GLUCOSE-CAPILLARY: 111 mg/dL — AB (ref 65–99)

## 2017-08-27 NOTE — Progress Notes (Signed)
  Progress Note    08/27/2017 10:48 AM * No surgery found *  Subjective:  No new complaints  Vitals:   08/27/17 0450 08/27/17 0825  BP: (!) 152/76   Pulse: 70   Resp: 18   Temp: 98 F (36.7 C)   SpO2: 97% 99%    Physical Exam: [email or web address] Right arm with stable edema  CBC    Component Value Date/Time   WBC 5.5 08/27/2017 0501   RBC 3.57 (L) 08/27/2017 0501   HGB 9.7 (L) 08/27/2017 0501   HCT 31.5 (L) 08/27/2017 0501   PLT 154 08/27/2017 0501   MCV 88.2 08/27/2017 0501   MCH 27.2 08/27/2017 0501   MCHC 30.8 08/27/2017 0501   RDW 15.4 08/27/2017 0501   LYMPHSABS 1.8 08/25/2017 1407   MONOABS 0.6 08/25/2017 1407   EOSABS 0.3 08/25/2017 1407   BASOSABS 0.0 08/25/2017 1407    BMET    Component Value Date/Time   NA 134 (L) 08/27/2017 0501   K 2.9 (L) 08/27/2017 0501   CL 98 (L) 08/27/2017 0501   CO2 27 08/27/2017 0501   GLUCOSE 110 (H) 08/27/2017 0501   BUN 10 08/27/2017 0501   CREATININE 6.02 (H) 08/27/2017 0501   CREATININE 7.03 (H) 10/31/2013 1208   CALCIUM 7.4 (L) 08/27/2017 0501   CALCIUM 7.4 (L) 03/01/2014 0944   GFRNONAA 10 (L) 08/27/2017 0501   GFRAA 11 (L) 08/27/2017 0501    INR    Component Value Date/Time   INR 1.46 08/03/2017 0033     Intake/Output Summary (Last 24 hours) at 08/27/2017 1048 Last data filed at 08/27/2017 0540 Gross per 24 hour  Intake 480 ml  Output 500 ml  Net -20 ml     Assessment:  52 y.o. male is here with fluid overload, previously has left arm avf that has failed to mature and new onset edema in right arm with tdc  Plan: Ok for dc from vascular standpoint Will set up as outpatient for left arm fistulogram with central vein evaluation.  He is likely going to need conversion to a left upper arm avf if he is going to continue hd rather than pd   Anye Brose C. Donzetta Matters, MD [facility] Office: [phone] Pager: [phone]  08/27/2017 10:48 AM

## 2017-08-27 NOTE — Progress Notes (Signed)
[facility] Progress Note  Subjective: had HD again last night, feeling much better.    Vitals:   08/26/17 2037 08/27/17 0450 08/27/17 0824 08/27/17 0825  BP: 138/60 (!) 152/76    Pulse: 74 70    Resp: 18 18    Temp: 99.6 F (37.6 C) 98 F (36.7 C)    TempSrc: Oral Oral    SpO2: 100% 97%  99%  Weight:  87.9 kg (193 lb 12.6 oz)    Height:   6\' 2"  (1.88 m)     Inpatient medications: . calcitRIOL  0.25 mcg Oral TID  . carvedilol  25 mg Oral BID WC  . Chlorhexidine Gluconate Cloth  6 each Topical Q0600  . heparin  5,000 Units Subcutaneous Q8H  . insulin aspart  0-9 Units Subcutaneous TID WC  . insulin detemir  5 Units Subcutaneous Daily  . multivitamin  1 tablet Oral q morning - 10a  . mupirocin ointment  1 application Nasal BID  . sodium chloride flush  3 mL Intravenous Q12H  . sucroferric oxyhydroxide  500 mg Oral TID WC   . sodium chloride    . sodium chloride    . sodium chloride     sodium chloride, sodium chloride, sodium chloride, acetaminophen **OR** acetaminophen, lidocaine (PF), lidocaine-prilocaine, pentafluoroprop-tetrafluoroeth, sodium chloride flush  Exam: Alert, nasal O2 No jvd Chest occ crackles at bases, mostly clear RRR no mrg Abd soft ntnd +BS  , PD cath clean exit Ext left forearm AVF, minimally matured +bruit R arm 2-3+ nonpitting edema throughout the arm R IJ TDC clean exit NF, Ox 3  Dialysis: TTS [facility] (also on PD) 4h 22min  450/800  86kg  2/2.25 bath  Hep 2600 RIJ TDC / maturing L RC AVG  BMD meds: -Velphoro 500 mg PO TID AC       Impression: 1  ESRD w uremia/ AMS - resolved. Question PD failure after peritonitis episode. For PET test next week.  2  Vol overload -resolved, up 2kg today 3  HTN cont meds 4  PD related peritonitis - recent, cx neg, rx'd 3 wks IP vanc finished 2/19 5  Anemia ckd, stable, not on esa at center 6  DM1 7  R arm edema (w ipsilat TDC) - calling [facility]VS 8  L arm poorly functioning L RC AV fistula -  placed > 1 yr ago. May need new access. Seen by [facility] for #7/8, they will arrange for L arm fistulogram in OP setting and will look at the R side at the same time for the RUE edema.    Plan - ok for dc today, he will go to his OP center for HD   Kelly Splinter MD [place] pager [phone]   08/27/2017, 1:17 PM   Recent Labs  Lab 08/25/17 1407 08/25/17 1432 08/26/17 0218 08/27/17 0501  NA 134* 137 137  136 134*  K 3.7 3.7 3.5  3.7 2.9*  CL 101 99* 102  102 98*  CO2 23  --  24  24 27   GLUCOSE 154* 145* 102*  102* 110*  BUN 33* 35* 14  14 10   CREATININE 12.61* 12.50* 6.73*  6.77*  6.80* 6.02*  CALCIUM 7.4*  --  7.4*  7.3* 7.4*  PHOS 6.1*  --  3.2  3.3  --    Recent Labs  Lab 08/25/17 1407 08/26/17 0218 08/27/17 0501  AST  --   --  24  ALT  --   --  21  ALKPHOS  --   --  124  BILITOT  --   --  0.6  PROT  --   --  7.0  ALBUMIN 1.8* 1.9*  2.0* 1.8*   Recent Labs  Lab 08/25/17 1407 08/25/17 1432 08/26/17 0218 08/27/17 0501  WBC 7.5  --  6.2 5.5  NEUTROABS 4.9  --   --   --   HGB 9.5* 10.2* 10.1* 9.7*  HCT 30.1* 30.0* 31.8* 31.5*  MCV 88.5  --  87.1 88.2  PLT 166  --  144* 154   Iron/TIBC/Ferritin/ %Sat    Component Value Date/Time   IRON 9 (L) 08/03/2017 0033   TIBC NOT CALCULATED 08/03/2017 0033   FERRITIN 1,275 (H) 08/03/2017 0033   IRONPCTSAT NOT CALCULATED 08/03/2017 0033

## 2017-08-27 NOTE — Progress Notes (Signed)
Patient given discharge instructions and all questions answered.  

## 2017-08-28 DIAGNOSIS — K658 Other peritonitis: Secondary | ICD-10-CM | POA: Diagnosis not present

## 2017-08-28 DIAGNOSIS — R17 Unspecified jaundice: Secondary | ICD-10-CM | POA: Diagnosis not present

## 2017-08-28 DIAGNOSIS — K650 Generalized (acute) peritonitis: Secondary | ICD-10-CM | POA: Diagnosis not present

## 2017-08-28 DIAGNOSIS — E440 Moderate protein-calorie malnutrition: Secondary | ICD-10-CM | POA: Diagnosis not present

## 2017-08-28 DIAGNOSIS — D509 Iron deficiency anemia, unspecified: Secondary | ICD-10-CM | POA: Diagnosis not present

## 2017-08-28 DIAGNOSIS — N186 End stage renal disease: Secondary | ICD-10-CM | POA: Diagnosis not present

## 2017-08-29 DIAGNOSIS — Z4682 Encounter for fitting and adjustment of non-vascular catheter: Secondary | ICD-10-CM | POA: Diagnosis not present

## 2017-08-29 DIAGNOSIS — D509 Iron deficiency anemia, unspecified: Secondary | ICD-10-CM | POA: Diagnosis not present

## 2017-08-29 DIAGNOSIS — R17 Unspecified jaundice: Secondary | ICD-10-CM | POA: Diagnosis not present

## 2017-08-29 DIAGNOSIS — N186 End stage renal disease: Secondary | ICD-10-CM | POA: Diagnosis not present

## 2017-08-29 DIAGNOSIS — K658 Other peritonitis: Secondary | ICD-10-CM | POA: Diagnosis not present

## 2017-08-29 DIAGNOSIS — E440 Moderate protein-calorie malnutrition: Secondary | ICD-10-CM | POA: Diagnosis not present

## 2017-08-29 DIAGNOSIS — T85611D Breakdown (mechanical) of intraperitoneal dialysis catheter, subsequent encounter: Secondary | ICD-10-CM | POA: Diagnosis not present

## 2017-08-29 DIAGNOSIS — K650 Generalized (acute) peritonitis: Secondary | ICD-10-CM | POA: Diagnosis not present

## 2017-08-30 DIAGNOSIS — R17 Unspecified jaundice: Secondary | ICD-10-CM | POA: Diagnosis not present

## 2017-08-30 DIAGNOSIS — K658 Other peritonitis: Secondary | ICD-10-CM | POA: Diagnosis not present

## 2017-08-30 DIAGNOSIS — K650 Generalized (acute) peritonitis: Secondary | ICD-10-CM | POA: Diagnosis not present

## 2017-08-30 DIAGNOSIS — E440 Moderate protein-calorie malnutrition: Secondary | ICD-10-CM | POA: Diagnosis not present

## 2017-08-30 DIAGNOSIS — D509 Iron deficiency anemia, unspecified: Secondary | ICD-10-CM | POA: Diagnosis not present

## 2017-08-30 DIAGNOSIS — N186 End stage renal disease: Secondary | ICD-10-CM | POA: Diagnosis not present

## 2017-08-30 NOTE — Discharge Summary (Addendum)
Physician Discharge Summary  Bryan Wilkerson:[id number] DOB: [birth year] DOA: 08/25/2017  PCP: Bryan Mclean, MD  Admit date: 08/25/2017 Discharge date: 08/27/2017  Time spent: 35 minutes  Recommendations for Outpatient Follow-up:  -VVS for outpatient eval to assess AVF PCP in 1 week  Discharge Diagnoses:    Metabolic encephalopathy   End stage renal disease ([place])   ESRD on dialysis ([place])   Type I diabetes mellitus with manifestations ([place])   Uremia   Discharge Condition: stable  Diet recommendation: Renal/diabetic  Filed Weights   08/26/17 1559 08/26/17 2011 08/27/17 0450  Weight: 93.6 kg (206 lb 5.6 oz) 91.8 kg (202 lb 6.1 oz) 87.9 kg (193 lb 12.6 oz)    History of present illness:  Mr. Bryan Wilkerson is a 52 year old male with ESRD, type 1 diabetes, anemia of chronic disease, was recently hospitalized with culture-negative peritonitis related to PD catheter, he was treated with 3 weeks of intraperitoneal vancomycin which was completed on 2/19. -Just transitioned over to hemodialysis as outpatient was brought to the emergency room due to worsening confusion, lethargy and swelling, found to be febrile in the emergency room and started on broad-spectrum antibiotics  Hospital Course:   1. Metabolic encephalopathy -Secondary to uremia, improved with HD -Felt to have PD failure after peritonitis episode just transitioned to outpatient hemodialysis -Status post extra HD per renal, mentation improving, almost back to baseline -Also started on broad-spectrum antibiotics for low-grade fever 2/21 in ED, afebrile since, Abx stopped and cultures negative  2. Transient Fever -Resolved, was started on broad-spectrum antibiotics on admission, blood cultures negative, Abx stopped -afebrile since and stable -Also just completed a three-week course of intraperitoneal vancomycin for culture-negative catheter related peritonitis on 2/19  3. Type 1 diabetes mellitus -Continue  Levemir  4. Anemia of chronic disease -continue Iron and Aranesp with HD  5. ESRD -Now on hemodialysis as noted above -HD per Renal -plan for assessing his AVF per VVS as outpatient  6. Hypertension -Continue Coreg, Norvasc discontinued  7. Access issues/. Poorly Functioning AV fistula 1 year -per VVS/Renal -FU with outpatient  Consultations:  Renal  Discharge Exam: Vitals:   08/27/17 0450 08/27/17 0825  BP: (!) 152/76   Pulse: 70   Resp: 18   Temp: 98 F (36.7 C)   SpO2: 97% 99%    General: AAOx3 Cardiovascular: S1S2/RRR Respiratory: CTAB  Discharge Instructions   Discharge Instructions    Discharge instructions   Complete by:  As directed    Renal Diabetic Diet   Increase activity slowly   Complete by:  As directed      Allergies as of 08/27/2017      Reactions   Penicillins Other (See Comments)   UNSPECIFIED REACTION FROM CHILDHOOD Has patient had a PCN reaction causing immediate rash, facial/tongue/throat swelling, SOB or lightheadedness with hypotension:Yes Has patient had a PCN reaction causing severe rash involving mucus membranes or skin necrosis:No Has patient had a PCN reaction that required hospitalization:Yes Has patient had a PCN reaction occurring within the last 10 years:No If all of the above answers are "NO", then may proceed with Cephalosporin use.      Medication List    TAKE these medications   amLODipine 5 MG tablet Commonly known as:  NORVASC Take 5 mg by mouth daily.   calcitRIOL 0.25 MCG capsule Commonly known as:  ROCALTROL Take 2 capsules (0.5 mcg total) by mouth daily. What changed:    how much to take  when to take this  carvedilol 6.25 MG tablet Commonly known as:  COREG Take 1 tablet (6.25 mg total) by mouth 2 (two) times daily with a meal. What changed:  how much to take   LEVEMIR FLEXTOUCH 100 UNIT/ML Pen Generic drug:  Insulin Detemir Inject 2-8 Units into the skin daily at 10 pm. Sliding Scale    NEPHRO-VITE PO Take 1 tablet by mouth every morning.   NOVOLOG FLEXPEN 100 UNIT/ML FlexPen Generic drug:  insulin aspart Sliding scale over  150-315 1 units   triamcinolone ointment 0.1 % Commonly known as:  KENALOG Apply 1 application topically as needed (psorasis).   VELPHORO 500 MG chewable tablet Generic drug:  sucroferric oxyhydroxide Chew 500 mg by mouth 3 (three) times daily with meals.      Allergies  Allergen Reactions  . Penicillins Other (See Comments)    UNSPECIFIED REACTION FROM CHILDHOOD Has patient had a PCN reaction causing immediate rash, facial/tongue/throat swelling, SOB or lightheadedness with hypotension:Yes Has patient had a PCN reaction causing severe rash involving mucus membranes or skin necrosis:No Has patient had a PCN reaction that required hospitalization:Yes Has patient had a PCN reaction occurring within the last 10 years:No If all of the above answers are "NO", then may proceed with Cephalosporin use.     Follow-up Information    Wilkerson, Bryan Filler, MD. Go on 09/07/2017.   Specialty:  Family Medicine Why:  @11 :30am Contact information: [place] [address] [phone]            The results of significant diagnostics from this hospitalization (including imaging, microbiology, ancillary and laboratory) are listed below for reference.    Significant Diagnostic Studies: Ct Abdomen Pelvis Wo Contrast  Addendum Date: 08/02/2017   ADDENDUM REPORT: 08/02/2017 19:40 ADDENDUM: After speaking with the clinician, area concern is the anterior abdominal wall in the lower abdomen. There is a soft tissue subcutaneous nodule anterior to the rectus muscle in the lower abdominal wall in the suprapubic region measuring up to 2.3 cm. Slight stranding noted in the subcutaneous soft tissues adjacent to this area. Cannot exclude cellulitis with small developing abscess. Electronically Signed   By: Rolm Baptise M.D.   On:  08/02/2017 19:40   Result Date: 08/02/2017 EXAM: CT ABDOMEN AND PELVIS WITHOUT CONTRAST TECHNIQUE: Multidetector CT imaging of the abdomen and pelvis was performed following the standard protocol without IV contrast. COMPARISON:  None. FINDINGS: Lower chest: Small left pleural effusion with left lower lobe atelectasis. Heart is borderline in size. Right lung base clear. Hepatobiliary: No focal hepatic abnormality. Gallbladder unremarkable. Pancreas: No focal abnormality or ductal dilatation. Spleen: No focal abnormality.  Normal size. Adrenals/Urinary Tract: No adrenal abnormality. No focal renal abnormality. No stones or hydronephrosis. Urinary bladder is unremarkable. Stomach/Bowel: Bowel grossly unremarkable. No evidence of bowel obstruction. Appendix not visualized. Evaluation of the bowel is limited due to limited oral contrast and stranding/haziness throughout the mesentery. Vascular/Lymphatic: Scattered aortic and iliac calcifications. No aneurysm or adenopathy. Reproductive: No visible focal abnormality. Other: Small amount of free fluid in the pelvis. There are locules of free air noted. Peritoneal dialysis catheter noted in the pelvis. Free air is presumably related to recent peritoneal dialysis. Stranding/haziness noted throughout the mesentery, possibly related to 3rd spacing of fluids or fluid overload. Musculoskeletal: No acute bony abnormality. IMPRESSION: Small left pleural effusion with left base atelectasis. Peritoneal dialysis catheter in the pelvis with small amount of free fluid and locules of free air, presumably related to recent dialysis. Appendix  not visualized. No definite acute process in the abdomen or pelvis. Electronically Signed: By: Rolm Baptise M.D. On: 08/02/2017 18:00   Dg Chest 2 View  Result Date: 08/26/2017 CLINICAL DATA:  Fever EXAM: CHEST  2 VIEW COMPARISON:  08/25/2017 FINDINGS: Right dialysis catheter remains in place, unchanged. Mild cardiomegaly. Platelike density in  the lingula, likely atelectasis. Small left pleural effusion. Right lung clear. IMPRESSION: Lingular atelectasis.  Small left pleural effusion. Mild cardiomegaly. Electronically Signed   By: Rolm Baptise M.D.   On: 08/26/2017 07:33   Dg Chest 2 View  Result Date: 08/02/2017 CLINICAL DATA:  RLQ abdominal pain and knot in groin. Pt is on daily dialysis and has h/o CHF, PNA, DM. EXAM: CHEST  2 VIEW COMPARISON:  07/14/2017 FINDINGS: Cardiac silhouette is normal in size. No mediastinal or hilar masses. No convincing adenopathy. Right-sided tunneled dual lumen central venous catheter is stable, distal tip in the right atrium. Left pleural effusion and left mid to lower lung zones scarring is stable from the prior exam. Lungs otherwise clear with no evidence to suggest pneumonia or pulmonary edema. No right pleural effusion. No pneumothorax. Skeletal structures are intact. IMPRESSION: No acute cardiopulmonary disease. Stable chronic changes in the left lung and at the left lung base, with a chronic left pleural effusion. Electronically Signed   By: Lajean Manes M.D.   On: 08/02/2017 17:57   Ct Head Wo Contrast  Result Date: 08/25/2017 CLINICAL DATA:  52 y/o M; found obtunded with hypoxia. Altered level of consciousness. EXAM: CT HEAD WITHOUT CONTRAST TECHNIQUE: Contiguous axial images were obtained from the base of the skull through the vertex without intravenous contrast. COMPARISON:  11/22/2016 CT head.  11/23/2016 MRI head. FINDINGS: Brain: No evidence of acute infarction, hemorrhage, hydrocephalus, extra-axial collection or mass lesion/mass effect. 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 Vascular: No hyperdense vessel or unexpected calcification. Skull: Normal. Negative for fracture or focal lesion. Sinuses/Orbits: Interval worsening paranasal sinus disease with near complete opacification of frontal, anterior  ethmoid, right posterior ethmoid, and maxillary sinuses. Increased attenuation of sinus secretions may represent inspissation or fungal elements. Normal aeration of mastoid air cells. Bilateral intra-ocular lens replacement. Other: None. IMPRESSION: 1. No acute intracranial abnormality identified. 2. Stable chronic microvascular ischemic changes and parenchymal volume loss of the brain given differences in technique. 3. Progression of severe paranasal sinus disease with near complete opacification of frontal, maxillary, and ethmoid sinuses. Electronically Signed   By: Kristine Garbe M.D.   On: 08/25/2017 20:23   Dg Chest Port 1 View  Result Date: 08/25/2017 CLINICAL DATA:  Shortness of breath. EXAM: PORTABLE CHEST 1 VIEW COMPARISON:  08/02/2017 FINDINGS: Right IJ central venous catheter with tip over the right atrium without significant change. Lungs are adequately inflated demonstrate worsening hazy opacification over the left base likely small effusion with atelectasis although infection is possible. There is cardiomegaly. Remainder the exam is unchanged. IMPRESSION: Worsening left base opacification likely effusion with atelectasis although infection is possible. Cardiomegaly. Right IJ central venous catheter unchanged with tip over the right atrium. Electronically Signed   By: Marin Olp M.D.   On: 08/25/2017 14:23    Microbiology: Recent Results (from the past 240 hour(s))  Respiratory Panel by PCR     Status: None   Collection Time: 08/26/17  1:37 AM  Result Value Ref Range Status   Adenovirus NOT DETECTED NOT DETECTED Final   Coronavirus 229E NOT  DETECTED NOT DETECTED Final   Coronavirus HKU1 NOT DETECTED NOT DETECTED Final   Coronavirus NL63 NOT DETECTED NOT DETECTED Final   Coronavirus OC43 NOT DETECTED NOT DETECTED Final   Metapneumovirus NOT DETECTED NOT DETECTED Final   Rhinovirus / Enterovirus NOT DETECTED NOT DETECTED Final   Influenza A NOT DETECTED NOT DETECTED Final    Influenza B NOT DETECTED NOT DETECTED Final   Parainfluenza Virus 1 NOT DETECTED NOT DETECTED Final   Parainfluenza Virus 2 NOT DETECTED NOT DETECTED Final   Parainfluenza Virus 3 NOT DETECTED NOT DETECTED Final   Parainfluenza Virus 4 NOT DETECTED NOT DETECTED Final   Respiratory Syncytial Virus NOT DETECTED NOT DETECTED Final   Bordetella pertussis NOT DETECTED NOT DETECTED Final   Chlamydophila pneumoniae NOT DETECTED NOT DETECTED Final   Mycoplasma pneumoniae NOT DETECTED NOT DETECTED Final    Comment: Performed at [facility], [place] [address] [place], [place] [address]  Culture, blood (routine x 2)     Status: None (Preliminary result)   Collection Time: 08/26/17  2:07 AM  Result Value Ref Range Status   Specimen Description BLOOD RIGHT ARM  Final   Special Requests   Final    BOTTLES DRAWN AEROBIC AND ANAEROBIC Blood Culture adequate volume   Culture   Final    NO GROWTH 4 DAYS Performed at [facility], [address] [place], [place] [address]    Report Status PENDING  Incomplete  Culture, blood (routine x 2)     Status: None (Preliminary result)   Collection Time: 08/26/17  8:24 AM  Result Value Ref Range Status   Specimen Description BLOOD RIGHT HAND  Final   Special Requests IN PEDIATRIC BOTTLE Blood Culture adequate volume  Final   Culture   Final    NO GROWTH 4 DAYS Performed at [facility], [place] [address], [place], [place] [address]    Report Status PENDING  Incomplete     Labs: Basic Metabolic Panel: Recent Labs  Lab 08/25/17 1407 08/25/17 1432 08/26/17 0218 08/27/17 0501  NA 134* 137 137  136 134*  K 3.7 3.7 3.5  3.7 2.9*  CL 101 99* 102  102 98*  CO2 23  --  24  24 27   GLUCOSE 154* 145* 102*  102* 110*  BUN 33* 35* 14  14 10   CREATININE 12.61* 12.50* 6.73*  6.77*  6.80* 6.02*  CALCIUM 7.4*  --  7.4*  7.3* 7.4*  PHOS 6.1*  --  3.2  3.3  --    Liver Function Tests: Recent Labs  Lab 08/25/17 1407 08/26/17 0218  08/27/17 0501  AST  --   --  24  ALT  --   --  21  ALKPHOS  --   --  124  BILITOT  --   --  0.6  PROT  --   --  7.0  ALBUMIN 1.8* 1.9*  2.0* 1.8*   No results for input(s): LIPASE, AMYLASE in the last 168 hours. No results for input(s): AMMONIA in the last 168 hours. CBC: Recent Labs  Lab 08/25/17 1407 08/25/17 1432 08/26/17 0218 08/27/17 0501  WBC 7.5  --  6.2 5.5  NEUTROABS 4.9  --   --   --   HGB 9.5* 10.2* 10.1* 9.7*  HCT 30.1* 30.0* 31.8* 31.5*  MCV 88.5  --  87.1 88.2  PLT 166  --  144* 154   Cardiac Enzymes: No results for input(s): CKTOTAL, CKMB,  CKMBINDEX, TROPONINI in the last 168 hours. BNP: BNP (last 3 results) No results for input(s): BNP in the last 8760 hours.  ProBNP (last 3 results) No results for input(s): PROBNP in the last 8760 hours.  CBG: Recent Labs  Lab 08/25/17 1357 08/26/17 0141 08/27/17 0745  GLUCAP 135* 108* 111*       Signed:  Domenic Polite MD.  [facility] 08/30/2017, 4:34 PM

## 2017-08-31 ENCOUNTER — Encounter (HOSPITAL_COMMUNITY): Admission: RE | Payer: Self-pay | Source: Ambulatory Visit

## 2017-08-31 ENCOUNTER — Ambulatory Visit (HOSPITAL_COMMUNITY): Admission: RE | Admit: 2017-08-31 | Payer: 59 | Source: Ambulatory Visit | Admitting: Vascular Surgery

## 2017-08-31 DIAGNOSIS — K650 Generalized (acute) peritonitis: Secondary | ICD-10-CM | POA: Diagnosis not present

## 2017-08-31 DIAGNOSIS — K658 Other peritonitis: Secondary | ICD-10-CM | POA: Diagnosis not present

## 2017-08-31 DIAGNOSIS — N186 End stage renal disease: Secondary | ICD-10-CM | POA: Diagnosis not present

## 2017-08-31 DIAGNOSIS — D509 Iron deficiency anemia, unspecified: Secondary | ICD-10-CM | POA: Diagnosis not present

## 2017-08-31 DIAGNOSIS — R17 Unspecified jaundice: Secondary | ICD-10-CM | POA: Diagnosis not present

## 2017-08-31 DIAGNOSIS — T85611D Breakdown (mechanical) of intraperitoneal dialysis catheter, subsequent encounter: Secondary | ICD-10-CM | POA: Diagnosis not present

## 2017-08-31 DIAGNOSIS — E440 Moderate protein-calorie malnutrition: Secondary | ICD-10-CM | POA: Diagnosis not present

## 2017-08-31 DIAGNOSIS — E1142 Type 2 diabetes mellitus with diabetic polyneuropathy: Secondary | ICD-10-CM | POA: Diagnosis not present

## 2017-08-31 LAB — CULTURE, BLOOD (ROUTINE X 2)
CULTURE: NO GROWTH
Culture: NO GROWTH
Special Requests: ADEQUATE
Special Requests: ADEQUATE

## 2017-08-31 SURGERY — A/V FISTULAGRAM
Anesthesia: LOCAL

## 2017-09-01 DIAGNOSIS — K658 Other peritonitis: Secondary | ICD-10-CM | POA: Diagnosis not present

## 2017-09-01 DIAGNOSIS — D509 Iron deficiency anemia, unspecified: Secondary | ICD-10-CM | POA: Diagnosis not present

## 2017-09-01 DIAGNOSIS — N186 End stage renal disease: Secondary | ICD-10-CM | POA: Diagnosis not present

## 2017-09-01 DIAGNOSIS — E440 Moderate protein-calorie malnutrition: Secondary | ICD-10-CM | POA: Diagnosis not present

## 2017-09-01 DIAGNOSIS — K650 Generalized (acute) peritonitis: Secondary | ICD-10-CM | POA: Diagnosis not present

## 2017-09-01 DIAGNOSIS — R17 Unspecified jaundice: Secondary | ICD-10-CM | POA: Diagnosis not present

## 2017-09-02 DIAGNOSIS — Z79899 Other long term (current) drug therapy: Secondary | ICD-10-CM | POA: Diagnosis not present

## 2017-09-02 DIAGNOSIS — Z4932 Encounter for adequacy testing for peritoneal dialysis: Secondary | ICD-10-CM | POA: Diagnosis not present

## 2017-09-02 DIAGNOSIS — R880 Cloudy (hemodialysis) (peritoneal) dialysis effluent: Secondary | ICD-10-CM | POA: Diagnosis not present

## 2017-09-02 DIAGNOSIS — K650 Generalized (acute) peritonitis: Secondary | ICD-10-CM | POA: Diagnosis not present

## 2017-09-02 DIAGNOSIS — N186 End stage renal disease: Secondary | ICD-10-CM | POA: Diagnosis not present

## 2017-09-02 DIAGNOSIS — E1129 Type 2 diabetes mellitus with other diabetic kidney complication: Secondary | ICD-10-CM | POA: Diagnosis not present

## 2017-09-02 DIAGNOSIS — E440 Moderate protein-calorie malnutrition: Secondary | ICD-10-CM | POA: Diagnosis not present

## 2017-09-02 DIAGNOSIS — R17 Unspecified jaundice: Secondary | ICD-10-CM | POA: Diagnosis not present

## 2017-09-02 DIAGNOSIS — Z992 Dependence on renal dialysis: Secondary | ICD-10-CM | POA: Diagnosis not present

## 2017-09-02 DIAGNOSIS — D630 Anemia in neoplastic disease: Secondary | ICD-10-CM | POA: Diagnosis not present

## 2017-09-02 DIAGNOSIS — K659 Peritonitis, unspecified: Secondary | ICD-10-CM | POA: Diagnosis not present

## 2017-09-02 DIAGNOSIS — K769 Liver disease, unspecified: Secondary | ICD-10-CM | POA: Diagnosis not present

## 2017-09-02 DIAGNOSIS — N2581 Secondary hyperparathyroidism of renal origin: Secondary | ICD-10-CM | POA: Diagnosis not present

## 2017-09-02 DIAGNOSIS — D509 Iron deficiency anemia, unspecified: Secondary | ICD-10-CM | POA: Diagnosis not present

## 2017-09-02 DIAGNOSIS — E876 Hypokalemia: Secondary | ICD-10-CM | POA: Diagnosis not present

## 2017-09-03 DIAGNOSIS — E876 Hypokalemia: Secondary | ICD-10-CM | POA: Diagnosis not present

## 2017-09-03 DIAGNOSIS — D509 Iron deficiency anemia, unspecified: Secondary | ICD-10-CM | POA: Diagnosis not present

## 2017-09-03 DIAGNOSIS — K650 Generalized (acute) peritonitis: Secondary | ICD-10-CM | POA: Diagnosis not present

## 2017-09-03 DIAGNOSIS — K659 Peritonitis, unspecified: Secondary | ICD-10-CM | POA: Diagnosis not present

## 2017-09-03 DIAGNOSIS — N186 End stage renal disease: Secondary | ICD-10-CM | POA: Diagnosis not present

## 2017-09-03 DIAGNOSIS — R880 Cloudy (hemodialysis) (peritoneal) dialysis effluent: Secondary | ICD-10-CM | POA: Diagnosis not present

## 2017-09-03 NOTE — Progress Notes (Deleted)
[place] at [facility] [address], [place], [place] [address] [phone]  Date:  09/07/2017   Name:  Bryan Wilkerson   DOB:  March 05, 1966   MRN:  [id number]  PCP:  Bryan Mclean, MD    Chief Complaint: No chief complaint on file.   History of Present Illness:  Bryan Wilkerson is a 52 y.o. very pleasant male patient who presents with the following:  Following up from a recent hospital stay for sepsis;  Admit date: 08/25/2017 Discharge date: 08/27/2017  Time spent: 35 minutes  Recommendations for Outpatient Follow-up:  -VVS for outpatient eval to assess AVF PCP in 1 week  Discharge Diagnoses:    Metabolic encephalopathy   End stage renal disease ([place])   ESRD on dialysis ([place])   Type I diabetes mellitus with manifestations ([place])   Uremia  History of present illness:  Bryan Wilkerson is a 52 year old male with ESRD, type 1 diabetes, anemia of chronic disease, was recently hospitalized with culture-negative peritonitis related to PD catheter, he was treated with 3 weeks of intraperitoneal vancomycin which was completed on 2/19. -Just transitioned over to hemodialysis as outpatient was brought to the emergency room due to worsening confusion, lethargy and swelling,found to be febrile in the emergency room and started on broad-spectrum antibiotics  Hospital Course:  1.Metabolic encephalopathy -Secondary to uremia, improved with HD -Felt to have PD failure after peritonitis episode just transitionedto outpatient hemodialysis -Status post extra HD per renal, mentation improving, almost back to baseline -Also started on broad-spectrum antibiotics for low-grade fever2/21 in ED, afebrile since, Abx stopped and cultures negative 2.Transient Fever -Resolved, was started on broad-spectrum antibiotics on admission, blood cultures negative, Abx stopped -afebrile since and stable -Also just completed a three-week  course of intraperitoneal vancomycin for culture-negative catheter related peritonitis on 2/19 3.Type 1 diabetes mellitus -Continue Levemir 4.Anemia of chronic disease -continue Iron and Aranesp with H 5.ESRD -Now on hemodialysis as noted above -HD per Renal -plan for assessing his AVF per VVS as outpatien 6.Hypertension -Continue Coreg, Norvasc discontinued 7.Access issues/.PoorlyFunctioning AV fistula 1 year -per VVS/Renal -FU with outpatient   Endocrinology- Bryan Wilkerson  Lab Results  Component Value Date   HGBA1C 11.9 (H) 11/23/2016     Patient Active Problem List   Diagnosis Date Noted  . Uremia 08/25/2017  . Altered mental status 08/25/2017  . Sepsis ([place]) 08/02/2017  . Cellulitis and abscess of trunk 08/02/2017  . Generalized abdominal pain 04/27/2017  . Loculated pleural effusion 04/27/2017  . Pneumonia 04/19/2017  . Diabetes mellitus due to underlying condition, uncontrolled, with stage 4 chronic kidney disease, with long-term current use of insulin ([place]) 01/13/2017  . Type II diabetes mellitus with manifestations ([place]) 11/23/2016  . Seizure ([place]) 11/23/2016  . Hypocalcemia 11/23/2016  . ESRD on dialysis ([place]) 11/16/2013  . Pre-operative cardiovascular examination 11/12/2013  . End stage renal disease ([place]) 11/12/2013  . Dyspnea 10/31/2013  . CHF (congestive heart failure), NYHA class II ([place]) 12/29/2012  . Essential hypertension, benign 12/29/2012  . Hypokalemia 12/29/2012  . Anemia 12/29/2012    Past Medical History:  Diagnosis Date  . Anemia   . CHF (congestive heart failure) ([place])   . Diabetic retinopathy ([place])   . ESRD on peritoneal dialysis ([place])    "7 days/week" (04/20/2017)  . Hypertension   . Pneumonia 2016; 04/19/2017  . Psoriasis   . Type I diabetes mellitus [facility])     Past Surgical  History:  Procedure Laterality Date  . AV FISTULA PLACEMENT Right 12/05/2013   Procedure: RADIOCEPHALIC VS. BRACHIOCEPHALIC ARTERIOVENOUS (AV) FISTULA  CREATION;  Surgeon: Conrad Bellwood, MD;  Location: [facility];  Service: Vascular;  Laterality: Right;  . AV FISTULA PLACEMENT Left 07/20/2016   Procedure: LEFT ARM RADIOCEPHALIC ARTERIOVENOUS (AV) FISTULA CREATION;  Surgeon: Waynetta Sandy, MD;  Location: [place];  Service: Vascular;  Laterality: Left;  . CATARACT EXTRACTION W/ INTRAOCULAR LENS  IMPLANT, BILATERAL Bilateral   . EYE SURGERY    . FISTULOGRAM Right 07/16/2014   Procedure: FISTULOGRAM;  Surgeon: Conrad Ramey, MD;  Location: [place];  Service: Vascular;  Laterality: Right;  . IR THORACENTESIS ASP PLEURAL SPACE W/IMG GUIDE  05/02/2017  . LIGATION OF COMPETING BRANCHES OF ARTERIOVENOUS FISTULA Right 07/16/2014   Procedure: LIGATION OF COMPETING BRANCHES OF ARTERIOVENOUS FISTULA;  Surgeon: Conrad Waleska, MD;  Location: [facility];  Service: Vascular;  Laterality: Right;  . PERITONEAL CATHETER INSERTION Left ~ 08/2016  . PORT-A-CATH REMOVAL  2017  . PORTA CATH INSERTION Right    "for hemodialysis"  . RETINAL DETACHMENT SURGERY Right     Social History   Tobacco Use  . Smoking status: Never Smoker  . Smokeless tobacco: Never Used  Substance Use Topics  . Alcohol use: No    Alcohol/week: 0.0 oz  . Drug use: No    Family History  Problem Relation Age of Onset  . Hypertension Mother   . Heart attack Mother   . Chronic Renal Failure Neg Hx   . Diabetes Neg Hx   . Stroke Neg Hx   . Cancer Neg Hx     Allergies  Allergen Reactions  . Penicillins Other (See Comments)    UNSPECIFIED REACTION FROM CHILDHOOD Has patient had a PCN reaction causing immediate rash, facial/tongue/throat swelling, SOB or lightheadedness with hypotension:Yes Has patient had a PCN reaction causing severe rash involving mucus membranes or skin necrosis:No Has patient had a PCN reaction that required hospitalization:Yes Has patient had a PCN reaction occurring within the last 10 years:No If all of the above answers are "NO", then may proceed with Cephalosporin  use.      Medication list has been reviewed and updated.  Current Outpatient Medications on File Prior to Visit  Medication Sig Dispense Refill  . amLODipine (NORVASC) 5 MG tablet Take 5 mg by mouth daily.     . B Complex-C-Folic Acid (NEPHRO-VITE PO) Take 1 tablet by mouth every morning.    . calcitRIOL (ROCALTROL) 0.25 MCG capsule Take 2 capsules (0.5 mcg total) by mouth daily. (Patient taking differently: Take 0.25 mcg by mouth 3 (three) times daily. ) 30 capsule 0  . carvedilol (COREG) 6.25 MG tablet Take 1 tablet (6.25 mg total) by mouth 2 (two) times daily with a meal. (Patient taking differently: Take 25 mg by mouth 2 (two) times daily with a meal. ) 60 tablet 0  . LEVEMIR FLEXTOUCH 100 UNIT/ML Pen Inject 2-8 Units into the skin daily at 10 pm. Sliding Scale    . NOVOLOG FLEXPEN 100 UNIT/ML FlexPen Sliding scale over  150-315 1 units    . sucroferric oxyhydroxide (VELPHORO) 500 MG chewable tablet Chew 500 mg by mouth 3 (three) times daily with meals.    . triamcinolone ointment (KENALOG) 0.1 % Apply 1 application topically as needed (psorasis).      No current facility-administered medications on file prior to visit.     Review of Systems:  As per HPI-  otherwise negative.    Physical Examination: There were no vitals filed for this visit. There were no vitals filed for this visit. There is no height or weight on file to calculate BMI. Ideal Body Weight:    GEN: WDWN, NAD, Non-toxic, A & O x 3 HEENT: Atraumatic, Normocephalic. Neck supple. No masses, No LAD. Ears and Nose: No external deformity. CV: RRR, No M/G/R. No JVD. No thrill. No extra heart sounds. PULM: CTA B, no wheezes, crackles, rhonchi. No retractions. No resp. distress. No accessory muscle use. ABD: S, NT, ND, +BS. No rebound. No HSM. EXTR: No c/c/e NEURO Normal gait.  PSYCH: Normally interactive. Conversant. Not depressed or anxious appearing.  Calm demeanor.    Assessment and  Plan: ***  Signed Lamar Blinks, MD

## 2017-09-04 DIAGNOSIS — K659 Peritonitis, unspecified: Secondary | ICD-10-CM | POA: Diagnosis not present

## 2017-09-04 DIAGNOSIS — K650 Generalized (acute) peritonitis: Secondary | ICD-10-CM | POA: Diagnosis not present

## 2017-09-04 DIAGNOSIS — E876 Hypokalemia: Secondary | ICD-10-CM | POA: Diagnosis not present

## 2017-09-04 DIAGNOSIS — D509 Iron deficiency anemia, unspecified: Secondary | ICD-10-CM | POA: Diagnosis not present

## 2017-09-04 DIAGNOSIS — R880 Cloudy (hemodialysis) (peritoneal) dialysis effluent: Secondary | ICD-10-CM | POA: Diagnosis not present

## 2017-09-04 DIAGNOSIS — N186 End stage renal disease: Secondary | ICD-10-CM | POA: Diagnosis not present

## 2017-09-05 DIAGNOSIS — K659 Peritonitis, unspecified: Secondary | ICD-10-CM | POA: Diagnosis not present

## 2017-09-05 DIAGNOSIS — R880 Cloudy (hemodialysis) (peritoneal) dialysis effluent: Secondary | ICD-10-CM | POA: Diagnosis not present

## 2017-09-05 DIAGNOSIS — K650 Generalized (acute) peritonitis: Secondary | ICD-10-CM | POA: Diagnosis not present

## 2017-09-05 DIAGNOSIS — N186 End stage renal disease: Secondary | ICD-10-CM | POA: Diagnosis not present

## 2017-09-05 DIAGNOSIS — E876 Hypokalemia: Secondary | ICD-10-CM | POA: Diagnosis not present

## 2017-09-05 DIAGNOSIS — D509 Iron deficiency anemia, unspecified: Secondary | ICD-10-CM | POA: Diagnosis not present

## 2017-09-06 DIAGNOSIS — E1122 Type 2 diabetes mellitus with diabetic chronic kidney disease: Secondary | ICD-10-CM | POA: Diagnosis not present

## 2017-09-06 DIAGNOSIS — K650 Generalized (acute) peritonitis: Secondary | ICD-10-CM | POA: Diagnosis not present

## 2017-09-06 DIAGNOSIS — K659 Peritonitis, unspecified: Secondary | ICD-10-CM | POA: Diagnosis not present

## 2017-09-06 DIAGNOSIS — R880 Cloudy (hemodialysis) (peritoneal) dialysis effluent: Secondary | ICD-10-CM | POA: Diagnosis not present

## 2017-09-06 DIAGNOSIS — I132 Hypertensive heart and chronic kidney disease with heart failure and with stage 5 chronic kidney disease, or end stage renal disease: Secondary | ICD-10-CM | POA: Diagnosis not present

## 2017-09-06 DIAGNOSIS — N186 End stage renal disease: Secondary | ICD-10-CM | POA: Diagnosis not present

## 2017-09-06 DIAGNOSIS — I509 Heart failure, unspecified: Secondary | ICD-10-CM | POA: Diagnosis not present

## 2017-09-06 DIAGNOSIS — Z4902 Encounter for fitting and adjustment of peritoneal dialysis catheter: Secondary | ICD-10-CM | POA: Diagnosis not present

## 2017-09-06 DIAGNOSIS — T85611A Breakdown (mechanical) of intraperitoneal dialysis catheter, initial encounter: Secondary | ICD-10-CM | POA: Diagnosis not present

## 2017-09-06 DIAGNOSIS — E876 Hypokalemia: Secondary | ICD-10-CM | POA: Diagnosis not present

## 2017-09-06 DIAGNOSIS — K660 Peritoneal adhesions (postprocedural) (postinfection): Secondary | ICD-10-CM | POA: Diagnosis not present

## 2017-09-06 DIAGNOSIS — T85691A Other mechanical complication of intraperitoneal dialysis catheter, initial encounter: Secondary | ICD-10-CM | POA: Diagnosis not present

## 2017-09-06 DIAGNOSIS — D509 Iron deficiency anemia, unspecified: Secondary | ICD-10-CM | POA: Diagnosis not present

## 2017-09-07 ENCOUNTER — Inpatient Hospital Stay: Payer: 59 | Admitting: Family Medicine

## 2017-09-07 DIAGNOSIS — E1142 Type 2 diabetes mellitus with diabetic polyneuropathy: Secondary | ICD-10-CM | POA: Diagnosis present

## 2017-09-07 DIAGNOSIS — T85611A Breakdown (mechanical) of intraperitoneal dialysis catheter, initial encounter: Secondary | ICD-10-CM | POA: Diagnosis present

## 2017-09-07 DIAGNOSIS — I509 Heart failure, unspecified: Secondary | ICD-10-CM | POA: Diagnosis present

## 2017-09-07 DIAGNOSIS — Z992 Dependence on renal dialysis: Secondary | ICD-10-CM | POA: Diagnosis not present

## 2017-09-07 DIAGNOSIS — R880 Cloudy (hemodialysis) (peritoneal) dialysis effluent: Secondary | ICD-10-CM | POA: Diagnosis not present

## 2017-09-07 DIAGNOSIS — K219 Gastro-esophageal reflux disease without esophagitis: Secondary | ICD-10-CM | POA: Diagnosis present

## 2017-09-07 DIAGNOSIS — E889 Metabolic disorder, unspecified: Secondary | ICD-10-CM | POA: Diagnosis present

## 2017-09-07 DIAGNOSIS — I120 Hypertensive chronic kidney disease with stage 5 chronic kidney disease or end stage renal disease: Secondary | ICD-10-CM | POA: Diagnosis not present

## 2017-09-07 DIAGNOSIS — E876 Hypokalemia: Secondary | ICD-10-CM | POA: Diagnosis not present

## 2017-09-07 DIAGNOSIS — K659 Peritonitis, unspecified: Secondary | ICD-10-CM | POA: Diagnosis not present

## 2017-09-07 DIAGNOSIS — M9080 Osteopathy in diseases classified elsewhere, unspecified site: Secondary | ICD-10-CM | POA: Diagnosis not present

## 2017-09-07 DIAGNOSIS — N186 End stage renal disease: Secondary | ICD-10-CM | POA: Diagnosis present

## 2017-09-07 DIAGNOSIS — K650 Generalized (acute) peritonitis: Secondary | ICD-10-CM | POA: Diagnosis not present

## 2017-09-07 DIAGNOSIS — I132 Hypertensive heart and chronic kidney disease with heart failure and with stage 5 chronic kidney disease, or end stage renal disease: Secondary | ICD-10-CM | POA: Diagnosis present

## 2017-09-07 DIAGNOSIS — D509 Iron deficiency anemia, unspecified: Secondary | ICD-10-CM | POA: Diagnosis not present

## 2017-09-07 DIAGNOSIS — R82998 Other abnormal findings in urine: Secondary | ICD-10-CM | POA: Diagnosis not present

## 2017-09-07 DIAGNOSIS — E1122 Type 2 diabetes mellitus with diabetic chronic kidney disease: Secondary | ICD-10-CM | POA: Diagnosis present

## 2017-09-09 DIAGNOSIS — K650 Generalized (acute) peritonitis: Secondary | ICD-10-CM | POA: Diagnosis not present

## 2017-09-09 DIAGNOSIS — D509 Iron deficiency anemia, unspecified: Secondary | ICD-10-CM | POA: Diagnosis not present

## 2017-09-09 DIAGNOSIS — K659 Peritonitis, unspecified: Secondary | ICD-10-CM | POA: Diagnosis not present

## 2017-09-09 DIAGNOSIS — N186 End stage renal disease: Secondary | ICD-10-CM | POA: Diagnosis not present

## 2017-09-09 DIAGNOSIS — E876 Hypokalemia: Secondary | ICD-10-CM | POA: Diagnosis not present

## 2017-09-09 DIAGNOSIS — R880 Cloudy (hemodialysis) (peritoneal) dialysis effluent: Secondary | ICD-10-CM | POA: Diagnosis not present

## 2017-09-09 MED ORDER — MORPHINE SULFATE 4 MG/ML IJ SOLN
2.00 | INTRAMUSCULAR | Status: DC
Start: ? — End: 2017-09-09

## 2017-09-09 MED ORDER — GLUCOSE 40 % PO GEL
15.00 g | ORAL | Status: DC
Start: ? — End: 2017-09-09

## 2017-09-09 MED ORDER — ONDANSETRON HCL 4 MG/2ML IJ SOLN
4.00 | INTRAMUSCULAR | Status: DC
Start: ? — End: 2017-09-09

## 2017-09-09 MED ORDER — SODIUM CHLORIDE 0.9 % IV SOLN
INTRAVENOUS | Status: DC
Start: ? — End: 2017-09-09

## 2017-09-09 MED ORDER — INSULIN LISPRO 100 UNIT/ML ~~LOC~~ SOLN
2.00 | SUBCUTANEOUS | Status: DC
Start: 2017-09-09 — End: 2017-09-09

## 2017-09-09 MED ORDER — CLONIDINE HCL 0.1 MG PO TABS
0.10 | ORAL_TABLET | ORAL | Status: DC
Start: ? — End: 2017-09-09

## 2017-09-09 MED ORDER — DICYCLOMINE HCL 20 MG PO TABS
20.00 | ORAL_TABLET | ORAL | Status: DC
Start: 2017-09-09 — End: 2017-09-09

## 2017-09-09 MED ORDER — GENERIC EXTERNAL MEDICATION
5.00 | Status: DC
Start: ? — End: 2017-09-09

## 2017-09-09 MED ORDER — DEXTROSE 50 % IV SOLN
12.00 g | INTRAVENOUS | Status: DC
Start: ? — End: 2017-09-09

## 2017-09-09 MED ORDER — HYDROCODONE-ACETAMINOPHEN 5-325 MG PO TABS
2.00 | ORAL_TABLET | ORAL | Status: DC
Start: ? — End: 2017-09-09

## 2017-09-09 MED ORDER — CALCITRIOL 0.25 MCG PO CAPS
0.25 | ORAL_CAPSULE | ORAL | Status: DC
Start: 2017-09-10 — End: 2017-09-09

## 2017-09-09 MED ORDER — GENERIC EXTERNAL MEDICATION
1.00 | Status: DC
Start: ? — End: 2017-09-09

## 2017-09-09 MED ORDER — HEPARIN SODIUM (PORCINE) 5000 UNIT/ML IJ SOLN
5000.00 | INTRAMUSCULAR | Status: DC
Start: 2017-09-09 — End: 2017-09-09

## 2017-09-09 MED ORDER — CALCIUM ACETATE (PHOS BINDER) 667 MG PO CAPS
1334.00 | ORAL_CAPSULE | ORAL | Status: DC
Start: 2017-09-09 — End: 2017-09-09

## 2017-09-09 MED ORDER — AMLODIPINE BESYLATE 5 MG PO TABS
10.00 | ORAL_TABLET | ORAL | Status: DC
Start: 2017-09-10 — End: 2017-09-09

## 2017-09-09 MED ORDER — CARVEDILOL 12.5 MG PO TABS
12.50 | ORAL_TABLET | ORAL | Status: DC
Start: 2017-09-09 — End: 2017-09-09

## 2017-09-10 DIAGNOSIS — N186 End stage renal disease: Secondary | ICD-10-CM | POA: Diagnosis not present

## 2017-09-10 DIAGNOSIS — K659 Peritonitis, unspecified: Secondary | ICD-10-CM | POA: Diagnosis not present

## 2017-09-10 DIAGNOSIS — R880 Cloudy (hemodialysis) (peritoneal) dialysis effluent: Secondary | ICD-10-CM | POA: Diagnosis not present

## 2017-09-10 DIAGNOSIS — D509 Iron deficiency anemia, unspecified: Secondary | ICD-10-CM | POA: Diagnosis not present

## 2017-09-10 DIAGNOSIS — K650 Generalized (acute) peritonitis: Secondary | ICD-10-CM | POA: Diagnosis not present

## 2017-09-10 DIAGNOSIS — E876 Hypokalemia: Secondary | ICD-10-CM | POA: Diagnosis not present

## 2017-09-11 DIAGNOSIS — E876 Hypokalemia: Secondary | ICD-10-CM | POA: Diagnosis not present

## 2017-09-11 DIAGNOSIS — K650 Generalized (acute) peritonitis: Secondary | ICD-10-CM | POA: Diagnosis not present

## 2017-09-11 DIAGNOSIS — R880 Cloudy (hemodialysis) (peritoneal) dialysis effluent: Secondary | ICD-10-CM | POA: Diagnosis not present

## 2017-09-11 DIAGNOSIS — N186 End stage renal disease: Secondary | ICD-10-CM | POA: Diagnosis not present

## 2017-09-11 DIAGNOSIS — K659 Peritonitis, unspecified: Secondary | ICD-10-CM | POA: Diagnosis not present

## 2017-09-11 DIAGNOSIS — D509 Iron deficiency anemia, unspecified: Secondary | ICD-10-CM | POA: Diagnosis not present

## 2017-09-12 DIAGNOSIS — D509 Iron deficiency anemia, unspecified: Secondary | ICD-10-CM | POA: Diagnosis not present

## 2017-09-12 DIAGNOSIS — K659 Peritonitis, unspecified: Secondary | ICD-10-CM | POA: Diagnosis not present

## 2017-09-12 DIAGNOSIS — E876 Hypokalemia: Secondary | ICD-10-CM | POA: Diagnosis not present

## 2017-09-12 DIAGNOSIS — N186 End stage renal disease: Secondary | ICD-10-CM | POA: Diagnosis not present

## 2017-09-12 DIAGNOSIS — R880 Cloudy (hemodialysis) (peritoneal) dialysis effluent: Secondary | ICD-10-CM | POA: Diagnosis not present

## 2017-09-12 DIAGNOSIS — K650 Generalized (acute) peritonitis: Secondary | ICD-10-CM | POA: Diagnosis not present

## 2017-09-13 DIAGNOSIS — N186 End stage renal disease: Secondary | ICD-10-CM | POA: Diagnosis not present

## 2017-09-13 DIAGNOSIS — K650 Generalized (acute) peritonitis: Secondary | ICD-10-CM | POA: Diagnosis not present

## 2017-09-13 DIAGNOSIS — R880 Cloudy (hemodialysis) (peritoneal) dialysis effluent: Secondary | ICD-10-CM | POA: Diagnosis not present

## 2017-09-13 DIAGNOSIS — E876 Hypokalemia: Secondary | ICD-10-CM | POA: Diagnosis not present

## 2017-09-13 DIAGNOSIS — K659 Peritonitis, unspecified: Secondary | ICD-10-CM | POA: Diagnosis not present

## 2017-09-13 DIAGNOSIS — D509 Iron deficiency anemia, unspecified: Secondary | ICD-10-CM | POA: Diagnosis not present

## 2017-09-14 DIAGNOSIS — K650 Generalized (acute) peritonitis: Secondary | ICD-10-CM | POA: Diagnosis not present

## 2017-09-14 DIAGNOSIS — E876 Hypokalemia: Secondary | ICD-10-CM | POA: Diagnosis not present

## 2017-09-14 DIAGNOSIS — D509 Iron deficiency anemia, unspecified: Secondary | ICD-10-CM | POA: Diagnosis not present

## 2017-09-14 DIAGNOSIS — K659 Peritonitis, unspecified: Secondary | ICD-10-CM | POA: Diagnosis not present

## 2017-09-14 DIAGNOSIS — N186 End stage renal disease: Secondary | ICD-10-CM | POA: Diagnosis not present

## 2017-09-14 DIAGNOSIS — R880 Cloudy (hemodialysis) (peritoneal) dialysis effluent: Secondary | ICD-10-CM | POA: Diagnosis not present

## 2017-09-15 DIAGNOSIS — N186 End stage renal disease: Secondary | ICD-10-CM | POA: Diagnosis not present

## 2017-09-15 DIAGNOSIS — D509 Iron deficiency anemia, unspecified: Secondary | ICD-10-CM | POA: Diagnosis not present

## 2017-09-15 DIAGNOSIS — K650 Generalized (acute) peritonitis: Secondary | ICD-10-CM | POA: Diagnosis not present

## 2017-09-15 DIAGNOSIS — K659 Peritonitis, unspecified: Secondary | ICD-10-CM | POA: Diagnosis not present

## 2017-09-15 DIAGNOSIS — R880 Cloudy (hemodialysis) (peritoneal) dialysis effluent: Secondary | ICD-10-CM | POA: Diagnosis not present

## 2017-09-15 DIAGNOSIS — E876 Hypokalemia: Secondary | ICD-10-CM | POA: Diagnosis not present

## 2017-09-16 DIAGNOSIS — N186 End stage renal disease: Secondary | ICD-10-CM | POA: Diagnosis not present

## 2017-09-16 DIAGNOSIS — K650 Generalized (acute) peritonitis: Secondary | ICD-10-CM | POA: Diagnosis not present

## 2017-09-16 DIAGNOSIS — R880 Cloudy (hemodialysis) (peritoneal) dialysis effluent: Secondary | ICD-10-CM | POA: Diagnosis not present

## 2017-09-16 DIAGNOSIS — D509 Iron deficiency anemia, unspecified: Secondary | ICD-10-CM | POA: Diagnosis not present

## 2017-09-16 DIAGNOSIS — K659 Peritonitis, unspecified: Secondary | ICD-10-CM | POA: Diagnosis not present

## 2017-09-16 DIAGNOSIS — E876 Hypokalemia: Secondary | ICD-10-CM | POA: Diagnosis not present

## 2017-09-17 DIAGNOSIS — K650 Generalized (acute) peritonitis: Secondary | ICD-10-CM | POA: Diagnosis not present

## 2017-09-17 DIAGNOSIS — K659 Peritonitis, unspecified: Secondary | ICD-10-CM | POA: Diagnosis not present

## 2017-09-17 DIAGNOSIS — D509 Iron deficiency anemia, unspecified: Secondary | ICD-10-CM | POA: Diagnosis not present

## 2017-09-17 DIAGNOSIS — N186 End stage renal disease: Secondary | ICD-10-CM | POA: Diagnosis not present

## 2017-09-17 DIAGNOSIS — R880 Cloudy (hemodialysis) (peritoneal) dialysis effluent: Secondary | ICD-10-CM | POA: Diagnosis not present

## 2017-09-17 DIAGNOSIS — E876 Hypokalemia: Secondary | ICD-10-CM | POA: Diagnosis not present

## 2017-09-18 DIAGNOSIS — D509 Iron deficiency anemia, unspecified: Secondary | ICD-10-CM | POA: Diagnosis not present

## 2017-09-18 DIAGNOSIS — K659 Peritonitis, unspecified: Secondary | ICD-10-CM | POA: Diagnosis not present

## 2017-09-18 DIAGNOSIS — R880 Cloudy (hemodialysis) (peritoneal) dialysis effluent: Secondary | ICD-10-CM | POA: Diagnosis not present

## 2017-09-18 DIAGNOSIS — K650 Generalized (acute) peritonitis: Secondary | ICD-10-CM | POA: Diagnosis not present

## 2017-09-18 DIAGNOSIS — N186 End stage renal disease: Secondary | ICD-10-CM | POA: Diagnosis not present

## 2017-09-18 DIAGNOSIS — E876 Hypokalemia: Secondary | ICD-10-CM | POA: Diagnosis not present

## 2017-09-19 DIAGNOSIS — K650 Generalized (acute) peritonitis: Secondary | ICD-10-CM | POA: Diagnosis not present

## 2017-09-19 DIAGNOSIS — K659 Peritonitis, unspecified: Secondary | ICD-10-CM | POA: Diagnosis not present

## 2017-09-19 DIAGNOSIS — E876 Hypokalemia: Secondary | ICD-10-CM | POA: Diagnosis not present

## 2017-09-19 DIAGNOSIS — R880 Cloudy (hemodialysis) (peritoneal) dialysis effluent: Secondary | ICD-10-CM | POA: Diagnosis not present

## 2017-09-19 DIAGNOSIS — N186 End stage renal disease: Secondary | ICD-10-CM | POA: Diagnosis not present

## 2017-09-19 DIAGNOSIS — D509 Iron deficiency anemia, unspecified: Secondary | ICD-10-CM | POA: Diagnosis not present

## 2017-09-20 DIAGNOSIS — R880 Cloudy (hemodialysis) (peritoneal) dialysis effluent: Secondary | ICD-10-CM | POA: Diagnosis not present

## 2017-09-20 DIAGNOSIS — K659 Peritonitis, unspecified: Secondary | ICD-10-CM | POA: Diagnosis not present

## 2017-09-20 DIAGNOSIS — N186 End stage renal disease: Secondary | ICD-10-CM | POA: Diagnosis not present

## 2017-09-20 DIAGNOSIS — E876 Hypokalemia: Secondary | ICD-10-CM | POA: Diagnosis not present

## 2017-09-20 DIAGNOSIS — K650 Generalized (acute) peritonitis: Secondary | ICD-10-CM | POA: Diagnosis not present

## 2017-09-20 DIAGNOSIS — D509 Iron deficiency anemia, unspecified: Secondary | ICD-10-CM | POA: Diagnosis not present

## 2017-09-21 DIAGNOSIS — E876 Hypokalemia: Secondary | ICD-10-CM | POA: Diagnosis not present

## 2017-09-21 DIAGNOSIS — N186 End stage renal disease: Secondary | ICD-10-CM | POA: Diagnosis not present

## 2017-09-21 DIAGNOSIS — K659 Peritonitis, unspecified: Secondary | ICD-10-CM | POA: Diagnosis not present

## 2017-09-21 DIAGNOSIS — D509 Iron deficiency anemia, unspecified: Secondary | ICD-10-CM | POA: Diagnosis not present

## 2017-09-21 DIAGNOSIS — K650 Generalized (acute) peritonitis: Secondary | ICD-10-CM | POA: Diagnosis not present

## 2017-09-21 DIAGNOSIS — R880 Cloudy (hemodialysis) (peritoneal) dialysis effluent: Secondary | ICD-10-CM | POA: Diagnosis not present

## 2017-09-22 DIAGNOSIS — K650 Generalized (acute) peritonitis: Secondary | ICD-10-CM | POA: Diagnosis not present

## 2017-09-22 DIAGNOSIS — R880 Cloudy (hemodialysis) (peritoneal) dialysis effluent: Secondary | ICD-10-CM | POA: Diagnosis not present

## 2017-09-22 DIAGNOSIS — K659 Peritonitis, unspecified: Secondary | ICD-10-CM | POA: Diagnosis not present

## 2017-09-22 DIAGNOSIS — E876 Hypokalemia: Secondary | ICD-10-CM | POA: Diagnosis not present

## 2017-09-22 DIAGNOSIS — N186 End stage renal disease: Secondary | ICD-10-CM | POA: Diagnosis not present

## 2017-09-22 DIAGNOSIS — D509 Iron deficiency anemia, unspecified: Secondary | ICD-10-CM | POA: Diagnosis not present

## 2017-09-23 DIAGNOSIS — N186 End stage renal disease: Secondary | ICD-10-CM | POA: Diagnosis not present

## 2017-09-23 DIAGNOSIS — D509 Iron deficiency anemia, unspecified: Secondary | ICD-10-CM | POA: Diagnosis not present

## 2017-09-23 DIAGNOSIS — K659 Peritonitis, unspecified: Secondary | ICD-10-CM | POA: Diagnosis not present

## 2017-09-23 DIAGNOSIS — R880 Cloudy (hemodialysis) (peritoneal) dialysis effluent: Secondary | ICD-10-CM | POA: Diagnosis not present

## 2017-09-23 DIAGNOSIS — E876 Hypokalemia: Secondary | ICD-10-CM | POA: Diagnosis not present

## 2017-09-23 DIAGNOSIS — K650 Generalized (acute) peritonitis: Secondary | ICD-10-CM | POA: Diagnosis not present

## 2017-09-24 DIAGNOSIS — K659 Peritonitis, unspecified: Secondary | ICD-10-CM | POA: Diagnosis not present

## 2017-09-24 DIAGNOSIS — R880 Cloudy (hemodialysis) (peritoneal) dialysis effluent: Secondary | ICD-10-CM | POA: Diagnosis not present

## 2017-09-24 DIAGNOSIS — E876 Hypokalemia: Secondary | ICD-10-CM | POA: Diagnosis not present

## 2017-09-24 DIAGNOSIS — K650 Generalized (acute) peritonitis: Secondary | ICD-10-CM | POA: Diagnosis not present

## 2017-09-24 DIAGNOSIS — N186 End stage renal disease: Secondary | ICD-10-CM | POA: Diagnosis not present

## 2017-09-24 DIAGNOSIS — D509 Iron deficiency anemia, unspecified: Secondary | ICD-10-CM | POA: Diagnosis not present

## 2017-09-25 DIAGNOSIS — R880 Cloudy (hemodialysis) (peritoneal) dialysis effluent: Secondary | ICD-10-CM | POA: Diagnosis not present

## 2017-09-25 DIAGNOSIS — K650 Generalized (acute) peritonitis: Secondary | ICD-10-CM | POA: Diagnosis not present

## 2017-09-25 DIAGNOSIS — K659 Peritonitis, unspecified: Secondary | ICD-10-CM | POA: Diagnosis not present

## 2017-09-25 DIAGNOSIS — E876 Hypokalemia: Secondary | ICD-10-CM | POA: Diagnosis not present

## 2017-09-25 DIAGNOSIS — N186 End stage renal disease: Secondary | ICD-10-CM | POA: Diagnosis not present

## 2017-09-25 DIAGNOSIS — D509 Iron deficiency anemia, unspecified: Secondary | ICD-10-CM | POA: Diagnosis not present

## 2017-09-26 DIAGNOSIS — E876 Hypokalemia: Secondary | ICD-10-CM | POA: Diagnosis not present

## 2017-09-26 DIAGNOSIS — K659 Peritonitis, unspecified: Secondary | ICD-10-CM | POA: Diagnosis not present

## 2017-09-26 DIAGNOSIS — N186 End stage renal disease: Secondary | ICD-10-CM | POA: Diagnosis not present

## 2017-09-26 DIAGNOSIS — R880 Cloudy (hemodialysis) (peritoneal) dialysis effluent: Secondary | ICD-10-CM | POA: Diagnosis not present

## 2017-09-26 DIAGNOSIS — K650 Generalized (acute) peritonitis: Secondary | ICD-10-CM | POA: Diagnosis not present

## 2017-09-26 DIAGNOSIS — D509 Iron deficiency anemia, unspecified: Secondary | ICD-10-CM | POA: Diagnosis not present

## 2017-09-27 DIAGNOSIS — E876 Hypokalemia: Secondary | ICD-10-CM | POA: Diagnosis not present

## 2017-09-27 DIAGNOSIS — R880 Cloudy (hemodialysis) (peritoneal) dialysis effluent: Secondary | ICD-10-CM | POA: Diagnosis not present

## 2017-09-27 DIAGNOSIS — D509 Iron deficiency anemia, unspecified: Secondary | ICD-10-CM | POA: Diagnosis not present

## 2017-09-27 DIAGNOSIS — N186 End stage renal disease: Secondary | ICD-10-CM | POA: Diagnosis not present

## 2017-09-27 DIAGNOSIS — K659 Peritonitis, unspecified: Secondary | ICD-10-CM | POA: Diagnosis not present

## 2017-09-27 DIAGNOSIS — K650 Generalized (acute) peritonitis: Secondary | ICD-10-CM | POA: Diagnosis not present

## 2017-09-28 ENCOUNTER — Encounter (HOSPITAL_COMMUNITY): Payer: Self-pay

## 2017-09-28 ENCOUNTER — Ambulatory Visit (HOSPITAL_COMMUNITY): Admit: 2017-09-28 | Payer: 59 | Admitting: Vascular Surgery

## 2017-09-28 DIAGNOSIS — E876 Hypokalemia: Secondary | ICD-10-CM | POA: Diagnosis not present

## 2017-09-28 DIAGNOSIS — D509 Iron deficiency anemia, unspecified: Secondary | ICD-10-CM | POA: Diagnosis not present

## 2017-09-28 DIAGNOSIS — R880 Cloudy (hemodialysis) (peritoneal) dialysis effluent: Secondary | ICD-10-CM | POA: Diagnosis not present

## 2017-09-28 DIAGNOSIS — K650 Generalized (acute) peritonitis: Secondary | ICD-10-CM | POA: Diagnosis not present

## 2017-09-28 DIAGNOSIS — N186 End stage renal disease: Secondary | ICD-10-CM | POA: Diagnosis not present

## 2017-09-28 DIAGNOSIS — K659 Peritonitis, unspecified: Secondary | ICD-10-CM | POA: Diagnosis not present

## 2017-09-28 SURGERY — A/V FISTULAGRAM
Anesthesia: LOCAL | Laterality: Left

## 2017-09-29 DIAGNOSIS — K659 Peritonitis, unspecified: Secondary | ICD-10-CM | POA: Diagnosis not present

## 2017-09-29 DIAGNOSIS — N186 End stage renal disease: Secondary | ICD-10-CM | POA: Diagnosis not present

## 2017-09-29 DIAGNOSIS — E876 Hypokalemia: Secondary | ICD-10-CM | POA: Diagnosis not present

## 2017-09-29 DIAGNOSIS — K650 Generalized (acute) peritonitis: Secondary | ICD-10-CM | POA: Diagnosis not present

## 2017-09-29 DIAGNOSIS — R880 Cloudy (hemodialysis) (peritoneal) dialysis effluent: Secondary | ICD-10-CM | POA: Diagnosis not present

## 2017-09-29 DIAGNOSIS — D509 Iron deficiency anemia, unspecified: Secondary | ICD-10-CM | POA: Diagnosis not present

## 2017-09-30 DIAGNOSIS — D509 Iron deficiency anemia, unspecified: Secondary | ICD-10-CM | POA: Diagnosis not present

## 2017-09-30 DIAGNOSIS — N186 End stage renal disease: Secondary | ICD-10-CM | POA: Diagnosis not present

## 2017-09-30 DIAGNOSIS — K650 Generalized (acute) peritonitis: Secondary | ICD-10-CM | POA: Diagnosis not present

## 2017-09-30 DIAGNOSIS — E876 Hypokalemia: Secondary | ICD-10-CM | POA: Diagnosis not present

## 2017-09-30 DIAGNOSIS — R880 Cloudy (hemodialysis) (peritoneal) dialysis effluent: Secondary | ICD-10-CM | POA: Diagnosis not present

## 2017-09-30 DIAGNOSIS — K659 Peritonitis, unspecified: Secondary | ICD-10-CM | POA: Diagnosis not present

## 2017-10-01 DIAGNOSIS — K659 Peritonitis, unspecified: Secondary | ICD-10-CM | POA: Diagnosis not present

## 2017-10-01 DIAGNOSIS — N186 End stage renal disease: Secondary | ICD-10-CM | POA: Diagnosis not present

## 2017-10-01 DIAGNOSIS — D509 Iron deficiency anemia, unspecified: Secondary | ICD-10-CM | POA: Diagnosis not present

## 2017-10-01 DIAGNOSIS — R880 Cloudy (hemodialysis) (peritoneal) dialysis effluent: Secondary | ICD-10-CM | POA: Diagnosis not present

## 2017-10-01 DIAGNOSIS — K650 Generalized (acute) peritonitis: Secondary | ICD-10-CM | POA: Diagnosis not present

## 2017-10-01 DIAGNOSIS — E876 Hypokalemia: Secondary | ICD-10-CM | POA: Diagnosis not present

## 2017-10-02 DIAGNOSIS — D509 Iron deficiency anemia, unspecified: Secondary | ICD-10-CM | POA: Diagnosis not present

## 2017-10-02 DIAGNOSIS — E876 Hypokalemia: Secondary | ICD-10-CM | POA: Diagnosis not present

## 2017-10-02 DIAGNOSIS — N186 End stage renal disease: Secondary | ICD-10-CM | POA: Diagnosis not present

## 2017-10-02 DIAGNOSIS — K659 Peritonitis, unspecified: Secondary | ICD-10-CM | POA: Diagnosis not present

## 2017-10-02 DIAGNOSIS — K650 Generalized (acute) peritonitis: Secondary | ICD-10-CM | POA: Diagnosis not present

## 2017-10-02 DIAGNOSIS — R880 Cloudy (hemodialysis) (peritoneal) dialysis effluent: Secondary | ICD-10-CM | POA: Diagnosis not present

## 2017-10-03 DIAGNOSIS — N2581 Secondary hyperparathyroidism of renal origin: Secondary | ICD-10-CM | POA: Diagnosis not present

## 2017-10-03 DIAGNOSIS — Z4932 Encounter for adequacy testing for peritoneal dialysis: Secondary | ICD-10-CM | POA: Diagnosis not present

## 2017-10-03 DIAGNOSIS — E1129 Type 2 diabetes mellitus with other diabetic kidney complication: Secondary | ICD-10-CM | POA: Diagnosis not present

## 2017-10-03 DIAGNOSIS — K659 Peritonitis, unspecified: Secondary | ICD-10-CM | POA: Diagnosis not present

## 2017-10-03 DIAGNOSIS — K769 Liver disease, unspecified: Secondary | ICD-10-CM | POA: Diagnosis not present

## 2017-10-03 DIAGNOSIS — D630 Anemia in neoplastic disease: Secondary | ICD-10-CM | POA: Diagnosis not present

## 2017-10-03 DIAGNOSIS — K650 Generalized (acute) peritonitis: Secondary | ICD-10-CM | POA: Diagnosis not present

## 2017-10-03 DIAGNOSIS — N186 End stage renal disease: Secondary | ICD-10-CM | POA: Diagnosis not present

## 2017-10-03 DIAGNOSIS — D631 Anemia in chronic kidney disease: Secondary | ICD-10-CM | POA: Diagnosis not present

## 2017-10-03 DIAGNOSIS — D509 Iron deficiency anemia, unspecified: Secondary | ICD-10-CM | POA: Diagnosis not present

## 2017-10-03 DIAGNOSIS — Z992 Dependence on renal dialysis: Secondary | ICD-10-CM | POA: Diagnosis not present

## 2017-10-03 DIAGNOSIS — N2589 Other disorders resulting from impaired renal tubular function: Secondary | ICD-10-CM | POA: Diagnosis not present

## 2017-10-04 DIAGNOSIS — N2581 Secondary hyperparathyroidism of renal origin: Secondary | ICD-10-CM | POA: Diagnosis not present

## 2017-10-04 DIAGNOSIS — D509 Iron deficiency anemia, unspecified: Secondary | ICD-10-CM | POA: Diagnosis not present

## 2017-10-04 DIAGNOSIS — R82998 Other abnormal findings in urine: Secondary | ICD-10-CM | POA: Diagnosis not present

## 2017-10-04 DIAGNOSIS — E1129 Type 2 diabetes mellitus with other diabetic kidney complication: Secondary | ICD-10-CM | POA: Diagnosis not present

## 2017-10-04 DIAGNOSIS — N186 End stage renal disease: Secondary | ICD-10-CM | POA: Diagnosis not present

## 2017-10-04 DIAGNOSIS — K650 Generalized (acute) peritonitis: Secondary | ICD-10-CM | POA: Diagnosis not present

## 2017-10-04 DIAGNOSIS — K659 Peritonitis, unspecified: Secondary | ICD-10-CM | POA: Diagnosis not present

## 2017-10-04 DIAGNOSIS — E7849 Other hyperlipidemia: Secondary | ICD-10-CM | POA: Diagnosis not present

## 2017-10-04 DIAGNOSIS — Z4932 Encounter for adequacy testing for peritoneal dialysis: Secondary | ICD-10-CM | POA: Diagnosis not present

## 2017-10-05 DIAGNOSIS — K659 Peritonitis, unspecified: Secondary | ICD-10-CM | POA: Diagnosis not present

## 2017-10-05 DIAGNOSIS — K650 Generalized (acute) peritonitis: Secondary | ICD-10-CM | POA: Diagnosis not present

## 2017-10-05 DIAGNOSIS — N2581 Secondary hyperparathyroidism of renal origin: Secondary | ICD-10-CM | POA: Diagnosis not present

## 2017-10-05 DIAGNOSIS — Z4932 Encounter for adequacy testing for peritoneal dialysis: Secondary | ICD-10-CM | POA: Diagnosis not present

## 2017-10-05 DIAGNOSIS — D509 Iron deficiency anemia, unspecified: Secondary | ICD-10-CM | POA: Diagnosis not present

## 2017-10-05 DIAGNOSIS — N186 End stage renal disease: Secondary | ICD-10-CM | POA: Diagnosis not present

## 2017-10-06 DIAGNOSIS — N2581 Secondary hyperparathyroidism of renal origin: Secondary | ICD-10-CM | POA: Diagnosis not present

## 2017-10-06 DIAGNOSIS — D509 Iron deficiency anemia, unspecified: Secondary | ICD-10-CM | POA: Diagnosis not present

## 2017-10-06 DIAGNOSIS — K650 Generalized (acute) peritonitis: Secondary | ICD-10-CM | POA: Diagnosis not present

## 2017-10-06 DIAGNOSIS — K659 Peritonitis, unspecified: Secondary | ICD-10-CM | POA: Diagnosis not present

## 2017-10-06 DIAGNOSIS — Z4932 Encounter for adequacy testing for peritoneal dialysis: Secondary | ICD-10-CM | POA: Diagnosis not present

## 2017-10-06 DIAGNOSIS — N186 End stage renal disease: Secondary | ICD-10-CM | POA: Diagnosis not present

## 2017-10-07 DIAGNOSIS — D509 Iron deficiency anemia, unspecified: Secondary | ICD-10-CM | POA: Diagnosis not present

## 2017-10-07 DIAGNOSIS — N2581 Secondary hyperparathyroidism of renal origin: Secondary | ICD-10-CM | POA: Diagnosis not present

## 2017-10-07 DIAGNOSIS — K659 Peritonitis, unspecified: Secondary | ICD-10-CM | POA: Diagnosis not present

## 2017-10-07 DIAGNOSIS — Z4932 Encounter for adequacy testing for peritoneal dialysis: Secondary | ICD-10-CM | POA: Diagnosis not present

## 2017-10-07 DIAGNOSIS — K650 Generalized (acute) peritonitis: Secondary | ICD-10-CM | POA: Diagnosis not present

## 2017-10-07 DIAGNOSIS — N186 End stage renal disease: Secondary | ICD-10-CM | POA: Diagnosis not present

## 2017-10-08 DIAGNOSIS — D509 Iron deficiency anemia, unspecified: Secondary | ICD-10-CM | POA: Diagnosis not present

## 2017-10-08 DIAGNOSIS — Z4932 Encounter for adequacy testing for peritoneal dialysis: Secondary | ICD-10-CM | POA: Diagnosis not present

## 2017-10-08 DIAGNOSIS — K650 Generalized (acute) peritonitis: Secondary | ICD-10-CM | POA: Diagnosis not present

## 2017-10-08 DIAGNOSIS — K659 Peritonitis, unspecified: Secondary | ICD-10-CM | POA: Diagnosis not present

## 2017-10-08 DIAGNOSIS — N2581 Secondary hyperparathyroidism of renal origin: Secondary | ICD-10-CM | POA: Diagnosis not present

## 2017-10-08 DIAGNOSIS — N186 End stage renal disease: Secondary | ICD-10-CM | POA: Diagnosis not present

## 2017-10-09 DIAGNOSIS — K650 Generalized (acute) peritonitis: Secondary | ICD-10-CM | POA: Diagnosis not present

## 2017-10-09 DIAGNOSIS — K659 Peritonitis, unspecified: Secondary | ICD-10-CM | POA: Diagnosis not present

## 2017-10-09 DIAGNOSIS — D509 Iron deficiency anemia, unspecified: Secondary | ICD-10-CM | POA: Diagnosis not present

## 2017-10-09 DIAGNOSIS — N2581 Secondary hyperparathyroidism of renal origin: Secondary | ICD-10-CM | POA: Diagnosis not present

## 2017-10-09 DIAGNOSIS — N186 End stage renal disease: Secondary | ICD-10-CM | POA: Diagnosis not present

## 2017-10-09 DIAGNOSIS — Z4932 Encounter for adequacy testing for peritoneal dialysis: Secondary | ICD-10-CM | POA: Diagnosis not present

## 2017-10-10 DIAGNOSIS — N186 End stage renal disease: Secondary | ICD-10-CM | POA: Diagnosis not present

## 2017-10-10 DIAGNOSIS — N2581 Secondary hyperparathyroidism of renal origin: Secondary | ICD-10-CM | POA: Diagnosis not present

## 2017-10-10 DIAGNOSIS — Z4932 Encounter for adequacy testing for peritoneal dialysis: Secondary | ICD-10-CM | POA: Diagnosis not present

## 2017-10-10 DIAGNOSIS — D509 Iron deficiency anemia, unspecified: Secondary | ICD-10-CM | POA: Diagnosis not present

## 2017-10-10 DIAGNOSIS — K650 Generalized (acute) peritonitis: Secondary | ICD-10-CM | POA: Diagnosis not present

## 2017-10-10 DIAGNOSIS — K659 Peritonitis, unspecified: Secondary | ICD-10-CM | POA: Diagnosis not present

## 2017-10-11 DIAGNOSIS — D509 Iron deficiency anemia, unspecified: Secondary | ICD-10-CM | POA: Diagnosis not present

## 2017-10-11 DIAGNOSIS — N186 End stage renal disease: Secondary | ICD-10-CM | POA: Diagnosis not present

## 2017-10-11 DIAGNOSIS — N2581 Secondary hyperparathyroidism of renal origin: Secondary | ICD-10-CM | POA: Diagnosis not present

## 2017-10-11 DIAGNOSIS — K650 Generalized (acute) peritonitis: Secondary | ICD-10-CM | POA: Diagnosis not present

## 2017-10-11 DIAGNOSIS — K659 Peritonitis, unspecified: Secondary | ICD-10-CM | POA: Diagnosis not present

## 2017-10-11 DIAGNOSIS — Z4932 Encounter for adequacy testing for peritoneal dialysis: Secondary | ICD-10-CM | POA: Diagnosis not present

## 2017-10-12 DIAGNOSIS — N186 End stage renal disease: Secondary | ICD-10-CM | POA: Diagnosis not present

## 2017-10-12 DIAGNOSIS — N2581 Secondary hyperparathyroidism of renal origin: Secondary | ICD-10-CM | POA: Diagnosis not present

## 2017-10-12 DIAGNOSIS — K659 Peritonitis, unspecified: Secondary | ICD-10-CM | POA: Diagnosis not present

## 2017-10-12 DIAGNOSIS — K650 Generalized (acute) peritonitis: Secondary | ICD-10-CM | POA: Diagnosis not present

## 2017-10-12 DIAGNOSIS — D509 Iron deficiency anemia, unspecified: Secondary | ICD-10-CM | POA: Diagnosis not present

## 2017-10-12 DIAGNOSIS — Z4932 Encounter for adequacy testing for peritoneal dialysis: Secondary | ICD-10-CM | POA: Diagnosis not present

## 2017-10-13 DIAGNOSIS — D509 Iron deficiency anemia, unspecified: Secondary | ICD-10-CM | POA: Diagnosis not present

## 2017-10-13 DIAGNOSIS — Z4932 Encounter for adequacy testing for peritoneal dialysis: Secondary | ICD-10-CM | POA: Diagnosis not present

## 2017-10-13 DIAGNOSIS — N186 End stage renal disease: Secondary | ICD-10-CM | POA: Diagnosis not present

## 2017-10-13 DIAGNOSIS — K650 Generalized (acute) peritonitis: Secondary | ICD-10-CM | POA: Diagnosis not present

## 2017-10-13 DIAGNOSIS — K659 Peritonitis, unspecified: Secondary | ICD-10-CM | POA: Diagnosis not present

## 2017-10-13 DIAGNOSIS — N2581 Secondary hyperparathyroidism of renal origin: Secondary | ICD-10-CM | POA: Diagnosis not present

## 2017-10-14 DIAGNOSIS — K659 Peritonitis, unspecified: Secondary | ICD-10-CM | POA: Diagnosis not present

## 2017-10-14 DIAGNOSIS — Z4932 Encounter for adequacy testing for peritoneal dialysis: Secondary | ICD-10-CM | POA: Diagnosis not present

## 2017-10-14 DIAGNOSIS — N186 End stage renal disease: Secondary | ICD-10-CM | POA: Diagnosis not present

## 2017-10-14 DIAGNOSIS — D509 Iron deficiency anemia, unspecified: Secondary | ICD-10-CM | POA: Diagnosis not present

## 2017-10-14 DIAGNOSIS — K650 Generalized (acute) peritonitis: Secondary | ICD-10-CM | POA: Diagnosis not present

## 2017-10-14 DIAGNOSIS — N2581 Secondary hyperparathyroidism of renal origin: Secondary | ICD-10-CM | POA: Diagnosis not present

## 2017-10-15 DIAGNOSIS — K650 Generalized (acute) peritonitis: Secondary | ICD-10-CM | POA: Diagnosis not present

## 2017-10-15 DIAGNOSIS — Z4932 Encounter for adequacy testing for peritoneal dialysis: Secondary | ICD-10-CM | POA: Diagnosis not present

## 2017-10-15 DIAGNOSIS — D509 Iron deficiency anemia, unspecified: Secondary | ICD-10-CM | POA: Diagnosis not present

## 2017-10-15 DIAGNOSIS — K659 Peritonitis, unspecified: Secondary | ICD-10-CM | POA: Diagnosis not present

## 2017-10-15 DIAGNOSIS — N186 End stage renal disease: Secondary | ICD-10-CM | POA: Diagnosis not present

## 2017-10-15 DIAGNOSIS — N2581 Secondary hyperparathyroidism of renal origin: Secondary | ICD-10-CM | POA: Diagnosis not present

## 2017-10-16 DIAGNOSIS — K650 Generalized (acute) peritonitis: Secondary | ICD-10-CM | POA: Diagnosis not present

## 2017-10-16 DIAGNOSIS — N186 End stage renal disease: Secondary | ICD-10-CM | POA: Diagnosis not present

## 2017-10-16 DIAGNOSIS — N2581 Secondary hyperparathyroidism of renal origin: Secondary | ICD-10-CM | POA: Diagnosis not present

## 2017-10-16 DIAGNOSIS — K659 Peritonitis, unspecified: Secondary | ICD-10-CM | POA: Diagnosis not present

## 2017-10-16 DIAGNOSIS — D509 Iron deficiency anemia, unspecified: Secondary | ICD-10-CM | POA: Diagnosis not present

## 2017-10-16 DIAGNOSIS — Z4932 Encounter for adequacy testing for peritoneal dialysis: Secondary | ICD-10-CM | POA: Diagnosis not present

## 2017-10-17 DIAGNOSIS — K659 Peritonitis, unspecified: Secondary | ICD-10-CM | POA: Diagnosis not present

## 2017-10-17 DIAGNOSIS — N186 End stage renal disease: Secondary | ICD-10-CM | POA: Diagnosis not present

## 2017-10-17 DIAGNOSIS — D509 Iron deficiency anemia, unspecified: Secondary | ICD-10-CM | POA: Diagnosis not present

## 2017-10-17 DIAGNOSIS — N2581 Secondary hyperparathyroidism of renal origin: Secondary | ICD-10-CM | POA: Diagnosis not present

## 2017-10-17 DIAGNOSIS — K650 Generalized (acute) peritonitis: Secondary | ICD-10-CM | POA: Diagnosis not present

## 2017-10-17 DIAGNOSIS — Z4932 Encounter for adequacy testing for peritoneal dialysis: Secondary | ICD-10-CM | POA: Diagnosis not present

## 2017-10-18 DIAGNOSIS — N186 End stage renal disease: Secondary | ICD-10-CM | POA: Diagnosis not present

## 2017-10-18 DIAGNOSIS — K650 Generalized (acute) peritonitis: Secondary | ICD-10-CM | POA: Diagnosis not present

## 2017-10-18 DIAGNOSIS — D509 Iron deficiency anemia, unspecified: Secondary | ICD-10-CM | POA: Diagnosis not present

## 2017-10-18 DIAGNOSIS — N2581 Secondary hyperparathyroidism of renal origin: Secondary | ICD-10-CM | POA: Diagnosis not present

## 2017-10-18 DIAGNOSIS — Z4932 Encounter for adequacy testing for peritoneal dialysis: Secondary | ICD-10-CM | POA: Diagnosis not present

## 2017-10-18 DIAGNOSIS — K659 Peritonitis, unspecified: Secondary | ICD-10-CM | POA: Diagnosis not present

## 2017-10-19 DIAGNOSIS — Z4932 Encounter for adequacy testing for peritoneal dialysis: Secondary | ICD-10-CM | POA: Diagnosis not present

## 2017-10-19 DIAGNOSIS — N186 End stage renal disease: Secondary | ICD-10-CM | POA: Diagnosis not present

## 2017-10-19 DIAGNOSIS — D509 Iron deficiency anemia, unspecified: Secondary | ICD-10-CM | POA: Diagnosis not present

## 2017-10-19 DIAGNOSIS — K650 Generalized (acute) peritonitis: Secondary | ICD-10-CM | POA: Diagnosis not present

## 2017-10-19 DIAGNOSIS — K659 Peritonitis, unspecified: Secondary | ICD-10-CM | POA: Diagnosis not present

## 2017-10-19 DIAGNOSIS — N2581 Secondary hyperparathyroidism of renal origin: Secondary | ICD-10-CM | POA: Diagnosis not present

## 2017-10-20 DIAGNOSIS — K650 Generalized (acute) peritonitis: Secondary | ICD-10-CM | POA: Diagnosis not present

## 2017-10-20 DIAGNOSIS — D509 Iron deficiency anemia, unspecified: Secondary | ICD-10-CM | POA: Diagnosis not present

## 2017-10-20 DIAGNOSIS — N186 End stage renal disease: Secondary | ICD-10-CM | POA: Diagnosis not present

## 2017-10-20 DIAGNOSIS — N2581 Secondary hyperparathyroidism of renal origin: Secondary | ICD-10-CM | POA: Diagnosis not present

## 2017-10-20 DIAGNOSIS — K659 Peritonitis, unspecified: Secondary | ICD-10-CM | POA: Diagnosis not present

## 2017-10-20 DIAGNOSIS — Z4932 Encounter for adequacy testing for peritoneal dialysis: Secondary | ICD-10-CM | POA: Diagnosis not present

## 2017-10-21 DIAGNOSIS — D509 Iron deficiency anemia, unspecified: Secondary | ICD-10-CM | POA: Diagnosis not present

## 2017-10-21 DIAGNOSIS — Z4932 Encounter for adequacy testing for peritoneal dialysis: Secondary | ICD-10-CM | POA: Diagnosis not present

## 2017-10-21 DIAGNOSIS — N2581 Secondary hyperparathyroidism of renal origin: Secondary | ICD-10-CM | POA: Diagnosis not present

## 2017-10-21 DIAGNOSIS — N186 End stage renal disease: Secondary | ICD-10-CM | POA: Diagnosis not present

## 2017-10-21 DIAGNOSIS — K659 Peritonitis, unspecified: Secondary | ICD-10-CM | POA: Diagnosis not present

## 2017-10-21 DIAGNOSIS — K650 Generalized (acute) peritonitis: Secondary | ICD-10-CM | POA: Diagnosis not present

## 2017-10-22 DIAGNOSIS — K659 Peritonitis, unspecified: Secondary | ICD-10-CM | POA: Diagnosis not present

## 2017-10-22 DIAGNOSIS — K650 Generalized (acute) peritonitis: Secondary | ICD-10-CM | POA: Diagnosis not present

## 2017-10-22 DIAGNOSIS — N186 End stage renal disease: Secondary | ICD-10-CM | POA: Diagnosis not present

## 2017-10-22 DIAGNOSIS — N2581 Secondary hyperparathyroidism of renal origin: Secondary | ICD-10-CM | POA: Diagnosis not present

## 2017-10-22 DIAGNOSIS — Z4932 Encounter for adequacy testing for peritoneal dialysis: Secondary | ICD-10-CM | POA: Diagnosis not present

## 2017-10-22 DIAGNOSIS — D509 Iron deficiency anemia, unspecified: Secondary | ICD-10-CM | POA: Diagnosis not present

## 2017-10-23 DIAGNOSIS — D509 Iron deficiency anemia, unspecified: Secondary | ICD-10-CM | POA: Diagnosis not present

## 2017-10-23 DIAGNOSIS — K659 Peritonitis, unspecified: Secondary | ICD-10-CM | POA: Diagnosis not present

## 2017-10-23 DIAGNOSIS — N186 End stage renal disease: Secondary | ICD-10-CM | POA: Diagnosis not present

## 2017-10-23 DIAGNOSIS — N2581 Secondary hyperparathyroidism of renal origin: Secondary | ICD-10-CM | POA: Diagnosis not present

## 2017-10-23 DIAGNOSIS — Z4932 Encounter for adequacy testing for peritoneal dialysis: Secondary | ICD-10-CM | POA: Diagnosis not present

## 2017-10-23 DIAGNOSIS — K650 Generalized (acute) peritonitis: Secondary | ICD-10-CM | POA: Diagnosis not present

## 2017-10-24 DIAGNOSIS — N2581 Secondary hyperparathyroidism of renal origin: Secondary | ICD-10-CM | POA: Diagnosis not present

## 2017-10-24 DIAGNOSIS — Z4932 Encounter for adequacy testing for peritoneal dialysis: Secondary | ICD-10-CM | POA: Diagnosis not present

## 2017-10-24 DIAGNOSIS — D509 Iron deficiency anemia, unspecified: Secondary | ICD-10-CM | POA: Diagnosis not present

## 2017-10-24 DIAGNOSIS — K659 Peritonitis, unspecified: Secondary | ICD-10-CM | POA: Diagnosis not present

## 2017-10-24 DIAGNOSIS — K650 Generalized (acute) peritonitis: Secondary | ICD-10-CM | POA: Diagnosis not present

## 2017-10-24 DIAGNOSIS — N186 End stage renal disease: Secondary | ICD-10-CM | POA: Diagnosis not present

## 2017-10-25 DIAGNOSIS — K659 Peritonitis, unspecified: Secondary | ICD-10-CM | POA: Diagnosis not present

## 2017-10-25 DIAGNOSIS — Z4932 Encounter for adequacy testing for peritoneal dialysis: Secondary | ICD-10-CM | POA: Diagnosis not present

## 2017-10-25 DIAGNOSIS — K650 Generalized (acute) peritonitis: Secondary | ICD-10-CM | POA: Diagnosis not present

## 2017-10-25 DIAGNOSIS — D509 Iron deficiency anemia, unspecified: Secondary | ICD-10-CM | POA: Diagnosis not present

## 2017-10-25 DIAGNOSIS — N186 End stage renal disease: Secondary | ICD-10-CM | POA: Diagnosis not present

## 2017-10-25 DIAGNOSIS — N2581 Secondary hyperparathyroidism of renal origin: Secondary | ICD-10-CM | POA: Diagnosis not present

## 2017-10-26 DIAGNOSIS — N2581 Secondary hyperparathyroidism of renal origin: Secondary | ICD-10-CM | POA: Diagnosis not present

## 2017-10-26 DIAGNOSIS — K650 Generalized (acute) peritonitis: Secondary | ICD-10-CM | POA: Diagnosis not present

## 2017-10-26 DIAGNOSIS — Z4932 Encounter for adequacy testing for peritoneal dialysis: Secondary | ICD-10-CM | POA: Diagnosis not present

## 2017-10-26 DIAGNOSIS — N186 End stage renal disease: Secondary | ICD-10-CM | POA: Diagnosis not present

## 2017-10-26 DIAGNOSIS — K659 Peritonitis, unspecified: Secondary | ICD-10-CM | POA: Diagnosis not present

## 2017-10-26 DIAGNOSIS — D509 Iron deficiency anemia, unspecified: Secondary | ICD-10-CM | POA: Diagnosis not present

## 2017-10-27 DIAGNOSIS — E1122 Type 2 diabetes mellitus with diabetic chronic kidney disease: Secondary | ICD-10-CM | POA: Diagnosis not present

## 2017-10-27 DIAGNOSIS — N2581 Secondary hyperparathyroidism of renal origin: Secondary | ICD-10-CM | POA: Diagnosis not present

## 2017-10-27 DIAGNOSIS — N186 End stage renal disease: Secondary | ICD-10-CM | POA: Diagnosis not present

## 2017-10-27 DIAGNOSIS — A088 Other specified intestinal infections: Secondary | ICD-10-CM | POA: Diagnosis not present

## 2017-10-29 DIAGNOSIS — A088 Other specified intestinal infections: Secondary | ICD-10-CM | POA: Diagnosis not present

## 2017-10-29 DIAGNOSIS — E1122 Type 2 diabetes mellitus with diabetic chronic kidney disease: Secondary | ICD-10-CM | POA: Diagnosis not present

## 2017-10-29 DIAGNOSIS — N2581 Secondary hyperparathyroidism of renal origin: Secondary | ICD-10-CM | POA: Diagnosis not present

## 2017-10-29 DIAGNOSIS — N186 End stage renal disease: Secondary | ICD-10-CM | POA: Diagnosis not present

## 2017-11-01 ENCOUNTER — Other Ambulatory Visit: Payer: Self-pay

## 2017-11-01 DIAGNOSIS — Z48812 Encounter for surgical aftercare following surgery on the circulatory system: Secondary | ICD-10-CM

## 2017-11-01 DIAGNOSIS — A088 Other specified intestinal infections: Secondary | ICD-10-CM | POA: Diagnosis not present

## 2017-11-01 DIAGNOSIS — N186 End stage renal disease: Secondary | ICD-10-CM | POA: Diagnosis not present

## 2017-11-01 DIAGNOSIS — E1122 Type 2 diabetes mellitus with diabetic chronic kidney disease: Secondary | ICD-10-CM | POA: Diagnosis not present

## 2017-11-01 DIAGNOSIS — N2581 Secondary hyperparathyroidism of renal origin: Secondary | ICD-10-CM | POA: Diagnosis not present

## 2017-11-02 DIAGNOSIS — Z992 Dependence on renal dialysis: Secondary | ICD-10-CM | POA: Diagnosis not present

## 2017-11-02 DIAGNOSIS — E1142 Type 2 diabetes mellitus with diabetic polyneuropathy: Secondary | ICD-10-CM | POA: Diagnosis not present

## 2017-11-02 DIAGNOSIS — E1129 Type 2 diabetes mellitus with other diabetic kidney complication: Secondary | ICD-10-CM | POA: Diagnosis not present

## 2017-11-02 DIAGNOSIS — N186 End stage renal disease: Secondary | ICD-10-CM | POA: Diagnosis not present

## 2017-11-02 DIAGNOSIS — T8571XA Infection and inflammatory reaction due to peritoneal dialysis catheter, initial encounter: Secondary | ICD-10-CM | POA: Diagnosis not present

## 2017-11-02 DIAGNOSIS — T85611D Breakdown (mechanical) of intraperitoneal dialysis catheter, subsequent encounter: Secondary | ICD-10-CM | POA: Diagnosis not present

## 2017-11-02 DIAGNOSIS — K659 Peritonitis, unspecified: Secondary | ICD-10-CM | POA: Diagnosis not present

## 2017-11-03 DIAGNOSIS — A088 Other specified intestinal infections: Secondary | ICD-10-CM | POA: Diagnosis not present

## 2017-11-03 DIAGNOSIS — N2581 Secondary hyperparathyroidism of renal origin: Secondary | ICD-10-CM | POA: Diagnosis not present

## 2017-11-03 DIAGNOSIS — E1122 Type 2 diabetes mellitus with diabetic chronic kidney disease: Secondary | ICD-10-CM | POA: Diagnosis not present

## 2017-11-03 DIAGNOSIS — K659 Peritonitis, unspecified: Secondary | ICD-10-CM | POA: Insufficient documentation

## 2017-11-03 DIAGNOSIS — N186 End stage renal disease: Secondary | ICD-10-CM | POA: Diagnosis not present

## 2017-11-03 DIAGNOSIS — T8571XA Infection and inflammatory reaction due to peritoneal dialysis catheter, initial encounter: Secondary | ICD-10-CM

## 2017-11-05 DIAGNOSIS — E1122 Type 2 diabetes mellitus with diabetic chronic kidney disease: Secondary | ICD-10-CM | POA: Diagnosis not present

## 2017-11-05 DIAGNOSIS — A088 Other specified intestinal infections: Secondary | ICD-10-CM | POA: Diagnosis not present

## 2017-11-05 DIAGNOSIS — N2581 Secondary hyperparathyroidism of renal origin: Secondary | ICD-10-CM | POA: Diagnosis not present

## 2017-11-05 DIAGNOSIS — N186 End stage renal disease: Secondary | ICD-10-CM | POA: Diagnosis not present

## 2017-11-08 DIAGNOSIS — N2581 Secondary hyperparathyroidism of renal origin: Secondary | ICD-10-CM | POA: Diagnosis not present

## 2017-11-08 DIAGNOSIS — A088 Other specified intestinal infections: Secondary | ICD-10-CM | POA: Diagnosis not present

## 2017-11-08 DIAGNOSIS — N186 End stage renal disease: Secondary | ICD-10-CM | POA: Diagnosis not present

## 2017-11-08 DIAGNOSIS — E1122 Type 2 diabetes mellitus with diabetic chronic kidney disease: Secondary | ICD-10-CM | POA: Diagnosis not present

## 2017-11-10 DIAGNOSIS — A088 Other specified intestinal infections: Secondary | ICD-10-CM | POA: Diagnosis not present

## 2017-11-10 DIAGNOSIS — N186 End stage renal disease: Secondary | ICD-10-CM | POA: Diagnosis not present

## 2017-11-10 DIAGNOSIS — N2581 Secondary hyperparathyroidism of renal origin: Secondary | ICD-10-CM | POA: Diagnosis not present

## 2017-11-10 DIAGNOSIS — E1122 Type 2 diabetes mellitus with diabetic chronic kidney disease: Secondary | ICD-10-CM | POA: Diagnosis not present

## 2017-11-12 DIAGNOSIS — N186 End stage renal disease: Secondary | ICD-10-CM | POA: Diagnosis not present

## 2017-11-12 DIAGNOSIS — N2581 Secondary hyperparathyroidism of renal origin: Secondary | ICD-10-CM | POA: Diagnosis not present

## 2017-11-15 DIAGNOSIS — E1122 Type 2 diabetes mellitus with diabetic chronic kidney disease: Secondary | ICD-10-CM | POA: Diagnosis not present

## 2017-11-15 DIAGNOSIS — N186 End stage renal disease: Secondary | ICD-10-CM | POA: Diagnosis not present

## 2017-11-15 DIAGNOSIS — A088 Other specified intestinal infections: Secondary | ICD-10-CM | POA: Diagnosis not present

## 2017-11-15 DIAGNOSIS — N2581 Secondary hyperparathyroidism of renal origin: Secondary | ICD-10-CM | POA: Diagnosis not present

## 2017-11-17 DIAGNOSIS — N2581 Secondary hyperparathyroidism of renal origin: Secondary | ICD-10-CM | POA: Diagnosis not present

## 2017-11-17 DIAGNOSIS — A088 Other specified intestinal infections: Secondary | ICD-10-CM | POA: Diagnosis not present

## 2017-11-17 DIAGNOSIS — E1122 Type 2 diabetes mellitus with diabetic chronic kidney disease: Secondary | ICD-10-CM | POA: Diagnosis not present

## 2017-11-17 DIAGNOSIS — N186 End stage renal disease: Secondary | ICD-10-CM | POA: Diagnosis not present

## 2017-11-19 DIAGNOSIS — N186 End stage renal disease: Secondary | ICD-10-CM | POA: Diagnosis not present

## 2017-11-19 DIAGNOSIS — A088 Other specified intestinal infections: Secondary | ICD-10-CM | POA: Diagnosis not present

## 2017-11-19 DIAGNOSIS — N2581 Secondary hyperparathyroidism of renal origin: Secondary | ICD-10-CM | POA: Diagnosis not present

## 2017-11-19 DIAGNOSIS — E1122 Type 2 diabetes mellitus with diabetic chronic kidney disease: Secondary | ICD-10-CM | POA: Diagnosis not present

## 2017-11-22 DIAGNOSIS — N186 End stage renal disease: Secondary | ICD-10-CM | POA: Diagnosis not present

## 2017-11-22 DIAGNOSIS — N2581 Secondary hyperparathyroidism of renal origin: Secondary | ICD-10-CM | POA: Diagnosis not present

## 2017-11-22 DIAGNOSIS — E1122 Type 2 diabetes mellitus with diabetic chronic kidney disease: Secondary | ICD-10-CM | POA: Diagnosis not present

## 2017-11-22 DIAGNOSIS — A088 Other specified intestinal infections: Secondary | ICD-10-CM | POA: Diagnosis not present

## 2017-11-23 ENCOUNTER — Ambulatory Visit (HOSPITAL_COMMUNITY)
Admission: RE | Admit: 2017-11-23 | Discharge: 2017-11-23 | Disposition: A | Discharge status: Home / Self Care | Payer: Medicare Other | Source: Ambulatory Visit | Attending: Vascular Surgery | Admitting: Vascular Surgery

## 2017-11-23 ENCOUNTER — Encounter: Payer: Self-pay | Admitting: *Deleted

## 2017-11-23 ENCOUNTER — Encounter: Payer: Self-pay | Admitting: Vascular Surgery

## 2017-11-23 ENCOUNTER — Other Ambulatory Visit: Payer: Self-pay

## 2017-11-23 ENCOUNTER — Other Ambulatory Visit: Payer: Self-pay | Admitting: *Deleted

## 2017-11-23 ENCOUNTER — Ambulatory Visit (INDEPENDENT_AMBULATORY_CARE_PROVIDER_SITE_OTHER): Payer: Medicare Other | Admitting: Vascular Surgery

## 2017-11-23 VITALS — BP 155/78 | HR 71 | Temp 99.3°F | Resp 16 | Ht 73.0 in | Wt 186.0 lb

## 2017-11-23 DIAGNOSIS — Z48812 Encounter for surgical aftercare following surgery on the circulatory system: Secondary | ICD-10-CM

## 2017-11-23 DIAGNOSIS — N186 End stage renal disease: Secondary | ICD-10-CM

## 2017-11-23 DIAGNOSIS — I82602 Acute embolism and thrombosis of unspecified veins of left upper extremity: Secondary | ICD-10-CM | POA: Diagnosis not present

## 2017-11-23 DIAGNOSIS — Z992 Dependence on renal dialysis: Secondary | ICD-10-CM

## 2017-11-23 NOTE — Progress Notes (Signed)
Patient name: Bryan Wilkerson MRN: [id number] DOB: 1966-02-11 Sex: male  REASON FOR VISIT:   Follow-up of AV fistula  HPI:   Bryan Wilkerson is a pleasant 52 y.o. male who had a left radiocephalic fistula created by Dr. Servando Snare on 07/20/2016.  This patient had been on peritoneal dialysis but has recently switched in the last 6 weeks to hemodialysis.  He is using a right IJ catheter which is been in for some time.  They have not tried to use his fistula.  He denies pain or paresthesias in his left arm.  He denies any recent uremic symptoms.  Specifically, he denies nausea, vomiting, fatigue, anorexia, or palpitations.  Current Outpatient Medications  Medication Sig Dispense Refill  . amLODipine (NORVASC) 5 MG tablet Take 5 mg by mouth daily.     . B Complex-C-Folic Acid (NEPHRO-VITE PO) Take 1 tablet by mouth every morning.    . calcitRIOL (ROCALTROL) 0.25 MCG capsule Take 2 capsules (0.5 mcg total) by mouth daily. (Patient taking differently: Take 0.25 mcg by mouth 3 (three) times daily. ) 30 capsule 0  . carvedilol (COREG) 6.25 MG tablet Take 1 tablet (6.25 mg total) by mouth 2 (two) times daily with a meal. (Patient taking differently: Take 25 mg by mouth 2 (two) times daily with a meal. ) 60 tablet 0  . LEVEMIR FLEXTOUCH 100 UNIT/ML Pen Inject 2-8 Units into the skin daily at 10 pm. Sliding Scale    . NOVOLOG FLEXPEN 100 UNIT/ML FlexPen Sliding scale over  150-315 1 units    . sucroferric oxyhydroxide (VELPHORO) 500 MG chewable tablet Chew 500 mg by mouth 3 (three) times daily with meals.    . triamcinolone ointment (KENALOG) 0.1 % Apply 1 application topically as needed (psorasis).      No current facility-administered medications for this visit.     REVIEW OF SYSTEMS:  [X]  denotes positive finding, [ ]  denotes negative finding Cardiac  Comments:  Chest pain or chest pressure:    Shortness of breath upon exertion:    Short of breath when lying flat:    Irregular heart  rhythm:    Constitutional    Fever or chills:     PHYSICAL EXAM:   Vitals:   11/23/17 0913  BP: (!) 155/78  Pulse: 71  Resp: 16  Temp: 99.3 F (37.4 C)  TempSrc: Oral  SpO2: 90%  Weight: 186 lb (84.4 kg)  Height: 6\' 1"  (1.854 m)    GENERAL: The patient is a well-nourished male, in no acute distress. The vital signs are documented above. CARDIOVASCULAR: There is a regular rate and rhythm. PULMONARY: There is good air exchange bilaterally without wheezing or rales. This fistula has a decent thrill in the distal forearm but then is more difficult to follow centrally.  The fistula is not pulsatile. He has a palpable left radial pulse.  DATA:   DUPLEX AV FISTULA: I have independently interpreted the duplex of the left radiocephalic AV fistula.  The diameters of the fistula ranged from 0.21-0.54 cm.  There are some elevated velocities in the distal forearm.  MEDICAL ISSUES:   END-STAGE RENAL DISEASE: Given that the fistula is difficult to follow centrally I do not think there is adequate room for 2 needles.  I recommended that we proceed with a fistulogram to determine why parts of the fistula do not appear to be matured adequately.  He is scheduled to go on a trip and therefore cannot schedule this until June  7.  I have discussed the indications for the procedure and the potential complications.  I explained if we find a stenosis amenable to venoplasty that we could do this at the same time.  He dialyzes on Tuesdays Thursdays and Saturdays and his procedure is scheduled on a Friday which works best for them.  Deitra Mayo [facility] [phone]

## 2017-11-24 ENCOUNTER — Encounter: Payer: Self-pay | Admitting: Nephrology

## 2017-11-24 DIAGNOSIS — E1122 Type 2 diabetes mellitus with diabetic chronic kidney disease: Secondary | ICD-10-CM | POA: Diagnosis not present

## 2017-11-24 DIAGNOSIS — N186 End stage renal disease: Secondary | ICD-10-CM | POA: Diagnosis not present

## 2017-11-24 DIAGNOSIS — A088 Other specified intestinal infections: Secondary | ICD-10-CM | POA: Diagnosis not present

## 2017-11-24 DIAGNOSIS — N2581 Secondary hyperparathyroidism of renal origin: Secondary | ICD-10-CM | POA: Diagnosis not present

## 2017-11-26 DIAGNOSIS — N186 End stage renal disease: Secondary | ICD-10-CM | POA: Diagnosis not present

## 2017-11-26 DIAGNOSIS — N2581 Secondary hyperparathyroidism of renal origin: Secondary | ICD-10-CM | POA: Diagnosis not present

## 2017-11-29 DIAGNOSIS — N186 End stage renal disease: Secondary | ICD-10-CM | POA: Diagnosis not present

## 2017-11-29 DIAGNOSIS — N2581 Secondary hyperparathyroidism of renal origin: Secondary | ICD-10-CM | POA: Diagnosis not present

## 2017-12-01 DIAGNOSIS — N2581 Secondary hyperparathyroidism of renal origin: Secondary | ICD-10-CM | POA: Diagnosis not present

## 2017-12-01 DIAGNOSIS — N186 End stage renal disease: Secondary | ICD-10-CM | POA: Diagnosis not present

## 2017-12-03 DIAGNOSIS — N186 End stage renal disease: Secondary | ICD-10-CM | POA: Diagnosis not present

## 2017-12-03 DIAGNOSIS — N2581 Secondary hyperparathyroidism of renal origin: Secondary | ICD-10-CM | POA: Diagnosis not present

## 2017-12-06 DIAGNOSIS — N2581 Secondary hyperparathyroidism of renal origin: Secondary | ICD-10-CM | POA: Diagnosis not present

## 2017-12-06 DIAGNOSIS — N186 End stage renal disease: Secondary | ICD-10-CM | POA: Diagnosis not present

## 2017-12-08 DIAGNOSIS — N2581 Secondary hyperparathyroidism of renal origin: Secondary | ICD-10-CM | POA: Diagnosis not present

## 2017-12-08 DIAGNOSIS — N186 End stage renal disease: Secondary | ICD-10-CM | POA: Diagnosis not present

## 2017-12-10 DIAGNOSIS — N2581 Secondary hyperparathyroidism of renal origin: Secondary | ICD-10-CM | POA: Diagnosis not present

## 2017-12-10 DIAGNOSIS — N186 End stage renal disease: Secondary | ICD-10-CM | POA: Diagnosis not present

## 2017-12-13 DIAGNOSIS — A4101 Sepsis due to Methicillin susceptible Staphylococcus aureus: Secondary | ICD-10-CM | POA: Diagnosis not present

## 2017-12-13 DIAGNOSIS — N186 End stage renal disease: Secondary | ICD-10-CM | POA: Diagnosis not present

## 2017-12-13 DIAGNOSIS — N2581 Secondary hyperparathyroidism of renal origin: Secondary | ICD-10-CM | POA: Diagnosis not present

## 2017-12-15 DIAGNOSIS — N2581 Secondary hyperparathyroidism of renal origin: Secondary | ICD-10-CM | POA: Diagnosis not present

## 2017-12-15 DIAGNOSIS — A4101 Sepsis due to Methicillin susceptible Staphylococcus aureus: Secondary | ICD-10-CM | POA: Diagnosis not present

## 2017-12-15 DIAGNOSIS — N186 End stage renal disease: Secondary | ICD-10-CM | POA: Diagnosis not present

## 2017-12-17 DIAGNOSIS — M898X9 Other specified disorders of bone, unspecified site: Secondary | ICD-10-CM | POA: Diagnosis not present

## 2017-12-17 DIAGNOSIS — Z794 Long term (current) use of insulin: Secondary | ICD-10-CM | POA: Diagnosis not present

## 2017-12-17 DIAGNOSIS — R402242 Coma scale, best verbal response, confused conversation, at arrival to emergency department: Secondary | ICD-10-CM | POA: Diagnosis present

## 2017-12-17 DIAGNOSIS — R410 Disorientation, unspecified: Secondary | ICD-10-CM | POA: Diagnosis not present

## 2017-12-17 DIAGNOSIS — Z992 Dependence on renal dialysis: Secondary | ICD-10-CM | POA: Diagnosis not present

## 2017-12-17 DIAGNOSIS — R6520 Severe sepsis without septic shock: Secondary | ICD-10-CM | POA: Diagnosis not present

## 2017-12-17 DIAGNOSIS — I509 Heart failure, unspecified: Secondary | ICD-10-CM | POA: Diagnosis present

## 2017-12-17 DIAGNOSIS — D631 Anemia in chronic kidney disease: Secondary | ICD-10-CM | POA: Diagnosis present

## 2017-12-17 DIAGNOSIS — R9431 Abnormal electrocardiogram [ECG] [EKG]: Secondary | ICD-10-CM | POA: Diagnosis not present

## 2017-12-17 DIAGNOSIS — L03319 Cellulitis of trunk, unspecified: Secondary | ICD-10-CM | POA: Diagnosis not present

## 2017-12-17 DIAGNOSIS — E119 Type 2 diabetes mellitus without complications: Secondary | ICD-10-CM | POA: Diagnosis not present

## 2017-12-17 DIAGNOSIS — D688 Other specified coagulation defects: Secondary | ICD-10-CM | POA: Diagnosis not present

## 2017-12-17 DIAGNOSIS — R509 Fever, unspecified: Secondary | ICD-10-CM | POA: Diagnosis not present

## 2017-12-17 DIAGNOSIS — E889 Metabolic disorder, unspecified: Secondary | ICD-10-CM | POA: Diagnosis not present

## 2017-12-17 DIAGNOSIS — A419 Sepsis, unspecified organism: Secondary | ICD-10-CM | POA: Diagnosis present

## 2017-12-17 DIAGNOSIS — M8580 Other specified disorders of bone density and structure, unspecified site: Secondary | ICD-10-CM | POA: Diagnosis present

## 2017-12-17 DIAGNOSIS — R4020 Unspecified coma: Secondary | ICD-10-CM | POA: Diagnosis not present

## 2017-12-17 DIAGNOSIS — G92 Toxic encephalopathy: Secondary | ICD-10-CM | POA: Diagnosis present

## 2017-12-17 DIAGNOSIS — G40909 Epilepsy, unspecified, not intractable, without status epilepticus: Secondary | ICD-10-CM | POA: Diagnosis present

## 2017-12-17 DIAGNOSIS — R4182 Altered mental status, unspecified: Secondary | ICD-10-CM | POA: Diagnosis not present

## 2017-12-17 DIAGNOSIS — R0602 Shortness of breath: Secondary | ICD-10-CM | POA: Diagnosis not present

## 2017-12-17 DIAGNOSIS — R55 Syncope and collapse: Secondary | ICD-10-CM | POA: Diagnosis not present

## 2017-12-17 DIAGNOSIS — L02211 Cutaneous abscess of abdominal wall: Secondary | ICD-10-CM | POA: Diagnosis present

## 2017-12-17 DIAGNOSIS — B9561 Methicillin susceptible Staphylococcus aureus infection as the cause of diseases classified elsewhere: Secondary | ICD-10-CM | POA: Diagnosis not present

## 2017-12-17 DIAGNOSIS — A4101 Sepsis due to Methicillin susceptible Staphylococcus aureus: Secondary | ICD-10-CM | POA: Diagnosis present

## 2017-12-17 DIAGNOSIS — E1122 Type 2 diabetes mellitus with diabetic chronic kidney disease: Secondary | ICD-10-CM | POA: Diagnosis present

## 2017-12-17 DIAGNOSIS — B9562 Methicillin resistant Staphylococcus aureus infection as the cause of diseases classified elsewhere: Secondary | ICD-10-CM | POA: Diagnosis not present

## 2017-12-17 DIAGNOSIS — K651 Peritoneal abscess: Secondary | ICD-10-CM | POA: Diagnosis present

## 2017-12-17 DIAGNOSIS — N2581 Secondary hyperparathyroidism of renal origin: Secondary | ICD-10-CM | POA: Diagnosis not present

## 2017-12-17 DIAGNOSIS — T8571XD Infection and inflammatory reaction due to peritoneal dialysis catheter, subsequent encounter: Secondary | ICD-10-CM | POA: Diagnosis not present

## 2017-12-17 DIAGNOSIS — I132 Hypertensive heart and chronic kidney disease with heart failure and with stage 5 chronic kidney disease, or end stage renal disease: Secondary | ICD-10-CM | POA: Diagnosis present

## 2017-12-17 DIAGNOSIS — Z79899 Other long term (current) drug therapy: Secondary | ICD-10-CM | POA: Diagnosis not present

## 2017-12-17 DIAGNOSIS — L409 Psoriasis, unspecified: Secondary | ICD-10-CM | POA: Diagnosis present

## 2017-12-17 DIAGNOSIS — I10 Essential (primary) hypertension: Secondary | ICD-10-CM | POA: Diagnosis present

## 2017-12-17 DIAGNOSIS — K659 Peritonitis, unspecified: Secondary | ICD-10-CM | POA: Diagnosis not present

## 2017-12-17 DIAGNOSIS — E1142 Type 2 diabetes mellitus with diabetic polyneuropathy: Secondary | ICD-10-CM | POA: Diagnosis present

## 2017-12-17 DIAGNOSIS — A4102 Sepsis due to Methicillin resistant Staphylococcus aureus: Secondary | ICD-10-CM | POA: Diagnosis not present

## 2017-12-17 DIAGNOSIS — R000 Tachycardia, unspecified: Secondary | ICD-10-CM | POA: Diagnosis not present

## 2017-12-17 DIAGNOSIS — R402342 Coma scale, best motor response, flexion withdrawal, at arrival to emergency department: Secondary | ICD-10-CM | POA: Diagnosis not present

## 2017-12-17 DIAGNOSIS — R402441 Other coma, without documented Glasgow coma scale score, or with partial score reported, in the field [EMT or ambulance]: Secondary | ICD-10-CM | POA: Diagnosis not present

## 2017-12-17 DIAGNOSIS — I120 Hypertensive chronic kidney disease with stage 5 chronic kidney disease or end stage renal disease: Secondary | ICD-10-CM | POA: Diagnosis not present

## 2017-12-17 DIAGNOSIS — E8889 Other specified metabolic disorders: Secondary | ICD-10-CM | POA: Diagnosis present

## 2017-12-17 DIAGNOSIS — A4901 Methicillin susceptible Staphylococcus aureus infection, unspecified site: Secondary | ICD-10-CM | POA: Diagnosis not present

## 2017-12-17 DIAGNOSIS — D72829 Elevated white blood cell count, unspecified: Secondary | ICD-10-CM | POA: Diagnosis not present

## 2017-12-17 DIAGNOSIS — N186 End stage renal disease: Secondary | ICD-10-CM | POA: Diagnosis present

## 2017-12-17 DIAGNOSIS — T8571XA Infection and inflammatory reaction due to peritoneal dialysis catheter, initial encounter: Secondary | ICD-10-CM | POA: Diagnosis not present

## 2017-12-17 DIAGNOSIS — G40409 Other generalized epilepsy and epileptic syndromes, not intractable, without status epilepticus: Secondary | ICD-10-CM | POA: Diagnosis present

## 2017-12-17 DIAGNOSIS — E1140 Type 2 diabetes mellitus with diabetic neuropathy, unspecified: Secondary | ICD-10-CM | POA: Diagnosis not present

## 2017-12-17 DIAGNOSIS — I700 Atherosclerosis of aorta: Secondary | ICD-10-CM | POA: Diagnosis not present

## 2017-12-17 DIAGNOSIS — K219 Gastro-esophageal reflux disease without esophagitis: Secondary | ICD-10-CM | POA: Diagnosis present

## 2017-12-17 DIAGNOSIS — Y831 Surgical operation with implant of artificial internal device as the cause of abnormal reaction of the patient, or of later complication, without mention of misadventure at the time of the procedure: Secondary | ICD-10-CM | POA: Diagnosis not present

## 2017-12-17 DIAGNOSIS — R569 Unspecified convulsions: Secondary | ICD-10-CM | POA: Diagnosis not present

## 2017-12-17 DIAGNOSIS — R895 Abnormal microbiological findings in specimens from other organs, systems and tissues: Secondary | ICD-10-CM | POA: Diagnosis not present

## 2017-12-17 DIAGNOSIS — R0902 Hypoxemia: Secondary | ICD-10-CM | POA: Diagnosis not present

## 2017-12-17 DIAGNOSIS — R402122 Coma scale, eyes open, to pain, at arrival to emergency department: Secondary | ICD-10-CM | POA: Diagnosis not present

## 2017-12-17 DIAGNOSIS — R0600 Dyspnea, unspecified: Secondary | ICD-10-CM | POA: Diagnosis not present

## 2017-12-17 DIAGNOSIS — M899 Disorder of bone, unspecified: Secondary | ICD-10-CM | POA: Diagnosis not present

## 2017-12-17 DIAGNOSIS — N400 Enlarged prostate without lower urinary tract symptoms: Secondary | ICD-10-CM | POA: Diagnosis not present

## 2017-12-19 DIAGNOSIS — L02211 Cutaneous abscess of abdominal wall: Secondary | ICD-10-CM | POA: Insufficient documentation

## 2017-12-21 ENCOUNTER — Other Ambulatory Visit: Payer: Self-pay | Admitting: *Deleted

## 2017-12-23 MED ORDER — AMLODIPINE BESYLATE 5 MG PO TABS
10.00 | ORAL_TABLET | ORAL | Status: DC
Start: 2017-12-23 — End: 2017-12-23

## 2017-12-23 MED ORDER — EPOETIN ALFA 20000 UNIT/ML IJ SOLN
20000.00 | INTRAMUSCULAR | Status: DC
Start: ? — End: 2017-12-23

## 2017-12-23 MED ORDER — INSULIN LISPRO 100 UNIT/ML ~~LOC~~ SOLN
2.00 | SUBCUTANEOUS | Status: DC
Start: 2017-12-23 — End: 2017-12-23

## 2017-12-23 MED ORDER — GENERIC EXTERNAL MEDICATION
1.00 g | Status: DC
Start: 2017-12-23 — End: 2017-12-23

## 2017-12-23 MED ORDER — ACETAMINOPHEN 325 MG PO TABS
650.00 | ORAL_TABLET | ORAL | Status: DC
Start: ? — End: 2017-12-23

## 2017-12-23 MED ORDER — TRIAMCINOLONE ACETONIDE 0.1 % EX OINT
TOPICAL_OINTMENT | CUTANEOUS | Status: DC
Start: ? — End: 2017-12-23

## 2017-12-23 MED ORDER — DEXTROSE 50 % IV SOLN
12.00 g | INTRAVENOUS | Status: DC
Start: ? — End: 2017-12-23

## 2017-12-23 MED ORDER — GLUCOSE 40 % PO GEL
15.00 g | ORAL | Status: DC
Start: ? — End: 2017-12-23

## 2017-12-23 MED ORDER — CALCIUM ACETATE (PHOS BINDER) 667 MG PO CAPS
1334.00 | ORAL_CAPSULE | ORAL | Status: DC
Start: 2017-12-23 — End: 2017-12-23

## 2017-12-23 MED ORDER — OXYCODONE-ACETAMINOPHEN 5-325 MG PO TABS
1.00 | ORAL_TABLET | ORAL | Status: DC
Start: ? — End: 2017-12-23

## 2017-12-23 MED ORDER — CARVEDILOL 3.125 MG PO TABS
6.25 | ORAL_TABLET | ORAL | Status: DC
Start: 2017-12-23 — End: 2017-12-23

## 2017-12-23 MED ORDER — HYDRALAZINE HCL 20 MG/ML IJ SOLN
10.00 | INTRAMUSCULAR | Status: DC
Start: ? — End: 2017-12-23

## 2017-12-23 MED ORDER — DIVALPROEX SODIUM 500 MG PO DR TAB
500.00 | DELAYED_RELEASE_TABLET | ORAL | Status: DC
Start: 2017-12-23 — End: 2017-12-23

## 2017-12-23 MED ORDER — NEPHRO-VITE 0.8 MG PO TABS
1.00 | ORAL_TABLET | ORAL | Status: DC
Start: 2017-12-24 — End: 2017-12-23

## 2017-12-23 MED ORDER — GENERIC EXTERNAL MEDICATION
1.00 | Status: DC
Start: ? — End: 2017-12-23

## 2017-12-23 MED ORDER — LORAZEPAM 2 MG/ML IJ SOLN
0.50 | INTRAMUSCULAR | Status: DC
Start: ? — End: 2017-12-23

## 2017-12-23 MED ORDER — POLYETHYLENE GLYCOL 3350 17 G PO PACK
17.00 g | PACK | ORAL | Status: DC
Start: 2017-12-23 — End: 2017-12-23

## 2017-12-23 MED ORDER — ACETAMINOPHEN 650 MG RE SUPP
650.00 | RECTAL | Status: DC
Start: ? — End: 2017-12-23

## 2017-12-24 DIAGNOSIS — N186 End stage renal disease: Secondary | ICD-10-CM | POA: Diagnosis not present

## 2017-12-24 DIAGNOSIS — A4101 Sepsis due to Methicillin susceptible Staphylococcus aureus: Secondary | ICD-10-CM | POA: Diagnosis not present

## 2017-12-24 DIAGNOSIS — N2581 Secondary hyperparathyroidism of renal origin: Secondary | ICD-10-CM | POA: Diagnosis not present

## 2017-12-27 DIAGNOSIS — R4182 Altered mental status, unspecified: Secondary | ICD-10-CM | POA: Diagnosis not present

## 2017-12-27 DIAGNOSIS — R001 Bradycardia, unspecified: Secondary | ICD-10-CM | POA: Diagnosis not present

## 2017-12-27 DIAGNOSIS — R9431 Abnormal electrocardiogram [ECG] [EKG]: Secondary | ICD-10-CM | POA: Diagnosis not present

## 2017-12-27 DIAGNOSIS — J9811 Atelectasis: Secondary | ICD-10-CM | POA: Diagnosis not present

## 2017-12-27 DIAGNOSIS — R945 Abnormal results of liver function studies: Secondary | ICD-10-CM | POA: Diagnosis not present

## 2017-12-27 DIAGNOSIS — B9561 Methicillin susceptible Staphylococcus aureus infection as the cause of diseases classified elsewhere: Secondary | ICD-10-CM | POA: Diagnosis not present

## 2017-12-27 DIAGNOSIS — R400 Somnolence: Secondary | ICD-10-CM | POA: Diagnosis not present

## 2017-12-27 DIAGNOSIS — R0902 Hypoxemia: Secondary | ICD-10-CM | POA: Diagnosis not present

## 2017-12-27 DIAGNOSIS — J90 Pleural effusion, not elsewhere classified: Secondary | ICD-10-CM | POA: Diagnosis not present

## 2017-12-27 DIAGNOSIS — R569 Unspecified convulsions: Secondary | ICD-10-CM | POA: Diagnosis not present

## 2017-12-27 DIAGNOSIS — I509 Heart failure, unspecified: Secondary | ICD-10-CM | POA: Diagnosis present

## 2017-12-27 DIAGNOSIS — R768 Other specified abnormal immunological findings in serum: Secondary | ICD-10-CM | POA: Diagnosis not present

## 2017-12-27 DIAGNOSIS — I959 Hypotension, unspecified: Secondary | ICD-10-CM | POA: Diagnosis not present

## 2017-12-27 DIAGNOSIS — Z79899 Other long term (current) drug therapy: Secondary | ICD-10-CM | POA: Diagnosis not present

## 2017-12-27 DIAGNOSIS — T8571XA Infection and inflammatory reaction due to peritoneal dialysis catheter, initial encounter: Secondary | ICD-10-CM | POA: Diagnosis not present

## 2017-12-27 DIAGNOSIS — R188 Other ascites: Secondary | ICD-10-CM | POA: Diagnosis present

## 2017-12-27 DIAGNOSIS — Z833 Family history of diabetes mellitus: Secondary | ICD-10-CM | POA: Diagnosis not present

## 2017-12-27 DIAGNOSIS — G4089 Other seizures: Secondary | ICD-10-CM | POA: Diagnosis present

## 2017-12-27 DIAGNOSIS — I132 Hypertensive heart and chronic kidney disease with heart failure and with stage 5 chronic kidney disease, or end stage renal disease: Secondary | ICD-10-CM | POA: Diagnosis present

## 2017-12-27 DIAGNOSIS — D631 Anemia in chronic kidney disease: Secondary | ICD-10-CM | POA: Diagnosis present

## 2017-12-27 DIAGNOSIS — G40409 Other generalized epilepsy and epileptic syndromes, not intractable, without status epilepticus: Secondary | ICD-10-CM | POA: Diagnosis not present

## 2017-12-27 DIAGNOSIS — N185 Chronic kidney disease, stage 5: Secondary | ICD-10-CM | POA: Diagnosis not present

## 2017-12-27 DIAGNOSIS — Z8719 Personal history of other diseases of the digestive system: Secondary | ICD-10-CM | POA: Diagnosis not present

## 2017-12-27 DIAGNOSIS — G40909 Epilepsy, unspecified, not intractable, without status epilepticus: Secondary | ICD-10-CM | POA: Diagnosis not present

## 2017-12-27 DIAGNOSIS — E871 Hypo-osmolality and hyponatremia: Secondary | ICD-10-CM | POA: Diagnosis not present

## 2017-12-27 DIAGNOSIS — Z8614 Personal history of Methicillin resistant Staphylococcus aureus infection: Secondary | ICD-10-CM | POA: Diagnosis not present

## 2017-12-27 DIAGNOSIS — Z8249 Family history of ischemic heart disease and other diseases of the circulatory system: Secondary | ICD-10-CM | POA: Diagnosis not present

## 2017-12-27 DIAGNOSIS — R7989 Other specified abnormal findings of blood chemistry: Secondary | ICD-10-CM | POA: Diagnosis not present

## 2017-12-27 DIAGNOSIS — Z794 Long term (current) use of insulin: Secondary | ICD-10-CM | POA: Diagnosis not present

## 2017-12-27 DIAGNOSIS — Z792 Long term (current) use of antibiotics: Secondary | ICD-10-CM | POA: Diagnosis not present

## 2017-12-27 DIAGNOSIS — K219 Gastro-esophageal reflux disease without esophagitis: Secondary | ICD-10-CM | POA: Diagnosis present

## 2017-12-27 DIAGNOSIS — I120 Hypertensive chronic kidney disease with stage 5 chronic kidney disease or end stage renal disease: Secondary | ICD-10-CM | POA: Diagnosis not present

## 2017-12-27 DIAGNOSIS — E119 Type 2 diabetes mellitus without complications: Secondary | ICD-10-CM | POA: Diagnosis not present

## 2017-12-27 DIAGNOSIS — Z992 Dependence on renal dialysis: Secondary | ICD-10-CM | POA: Diagnosis not present

## 2017-12-27 DIAGNOSIS — R740 Nonspecific elevation of levels of transaminase and lactic acid dehydrogenase [LDH]: Secondary | ICD-10-CM | POA: Diagnosis not present

## 2017-12-27 DIAGNOSIS — F801 Expressive language disorder: Secondary | ICD-10-CM | POA: Diagnosis not present

## 2017-12-27 DIAGNOSIS — N186 End stage renal disease: Secondary | ICD-10-CM | POA: Diagnosis present

## 2017-12-27 DIAGNOSIS — K659 Peritonitis, unspecified: Secondary | ICD-10-CM | POA: Diagnosis not present

## 2017-12-27 DIAGNOSIS — E1122 Type 2 diabetes mellitus with diabetic chronic kidney disease: Secondary | ICD-10-CM | POA: Diagnosis present

## 2017-12-27 DIAGNOSIS — K7689 Other specified diseases of liver: Secondary | ICD-10-CM | POA: Diagnosis not present

## 2017-12-27 DIAGNOSIS — R402441 Other coma, without documented Glasgow coma scale score, or with partial score reported, in the field [EMT or ambulance]: Secondary | ICD-10-CM | POA: Diagnosis not present

## 2017-12-27 DIAGNOSIS — L409 Psoriasis, unspecified: Secondary | ICD-10-CM | POA: Diagnosis present

## 2017-12-30 DIAGNOSIS — R740 Nonspecific elevation of levels of transaminase and lactic acid dehydrogenase [LDH]: Secondary | ICD-10-CM

## 2017-12-30 DIAGNOSIS — R7401 Elevation of levels of liver transaminase levels: Secondary | ICD-10-CM | POA: Insufficient documentation

## 2017-12-30 MED ORDER — APREMILAST 30 MG PO TABS
30.00 | ORAL_TABLET | ORAL | Status: DC
Start: 2017-12-30 — End: 2017-12-30

## 2017-12-30 MED ORDER — DEXTROSE 50 % IV SOLN
12.00 g | INTRAVENOUS | Status: DC
Start: ? — End: 2017-12-30

## 2017-12-30 MED ORDER — LEVETIRACETAM 500 MG PO TABS
500.00 | ORAL_TABLET | ORAL | Status: DC
Start: ? — End: 2017-12-30

## 2017-12-30 MED ORDER — CARVEDILOL 3.125 MG PO TABS
6.25 | ORAL_TABLET | ORAL | Status: DC
Start: 2017-12-30 — End: 2017-12-30

## 2017-12-30 MED ORDER — GLUCOSE 40 % PO GEL
15.00 g | ORAL | Status: DC
Start: ? — End: 2017-12-30

## 2017-12-30 MED ORDER — CALCIUM ACETATE (PHOS BINDER) 667 MG PO CAPS
1334.00 | ORAL_CAPSULE | ORAL | Status: DC
Start: 2017-12-30 — End: 2017-12-30

## 2017-12-30 MED ORDER — INSULIN LISPRO 100 UNIT/ML ~~LOC~~ SOLN
2.00 | SUBCUTANEOUS | Status: DC
Start: 2017-12-30 — End: 2017-12-30

## 2017-12-30 MED ORDER — AMLODIPINE BESYLATE 5 MG PO TABS
10.00 | ORAL_TABLET | ORAL | Status: DC
Start: 2017-12-30 — End: 2017-12-30

## 2017-12-30 MED ORDER — GENERIC EXTERNAL MEDICATION
1.00 | Status: DC
Start: ? — End: 2017-12-30

## 2017-12-30 MED ORDER — LORAZEPAM 2 MG/ML IJ SOLN
2.00 | INTRAMUSCULAR | Status: DC
Start: ? — End: 2017-12-30

## 2017-12-30 MED ORDER — GENERIC EXTERNAL MEDICATION
1.00 g | Status: DC
Start: 2017-12-30 — End: 2017-12-30

## 2017-12-31 DIAGNOSIS — I509 Heart failure, unspecified: Secondary | ICD-10-CM | POA: Diagnosis not present

## 2017-12-31 DIAGNOSIS — R001 Bradycardia, unspecified: Secondary | ICD-10-CM | POA: Diagnosis not present

## 2017-12-31 DIAGNOSIS — B9562 Methicillin resistant Staphylococcus aureus infection as the cause of diseases classified elsewhere: Secondary | ICD-10-CM | POA: Diagnosis not present

## 2017-12-31 DIAGNOSIS — I120 Hypertensive chronic kidney disease with stage 5 chronic kidney disease or end stage renal disease: Secondary | ICD-10-CM | POA: Diagnosis not present

## 2017-12-31 DIAGNOSIS — N186 End stage renal disease: Secondary | ICD-10-CM | POA: Diagnosis not present

## 2017-12-31 DIAGNOSIS — K219 Gastro-esophageal reflux disease without esophagitis: Secondary | ICD-10-CM | POA: Diagnosis not present

## 2017-12-31 DIAGNOSIS — E1142 Type 2 diabetes mellitus with diabetic polyneuropathy: Secondary | ICD-10-CM | POA: Diagnosis not present

## 2017-12-31 DIAGNOSIS — Z794 Long term (current) use of insulin: Secondary | ICD-10-CM | POA: Diagnosis not present

## 2017-12-31 DIAGNOSIS — T8571XD Infection and inflammatory reaction due to peritoneal dialysis catheter, subsequent encounter: Secondary | ICD-10-CM | POA: Diagnosis not present

## 2017-12-31 DIAGNOSIS — K659 Peritonitis, unspecified: Secondary | ICD-10-CM | POA: Diagnosis not present

## 2017-12-31 DIAGNOSIS — D631 Anemia in chronic kidney disease: Secondary | ICD-10-CM | POA: Diagnosis not present

## 2017-12-31 DIAGNOSIS — R0902 Hypoxemia: Secondary | ICD-10-CM | POA: Diagnosis not present

## 2017-12-31 DIAGNOSIS — G9340 Encephalopathy, unspecified: Secondary | ICD-10-CM | POA: Diagnosis not present

## 2017-12-31 DIAGNOSIS — B9561 Methicillin susceptible Staphylococcus aureus infection as the cause of diseases classified elsewhere: Secondary | ICD-10-CM | POA: Diagnosis not present

## 2017-12-31 DIAGNOSIS — R4182 Altered mental status, unspecified: Secondary | ICD-10-CM | POA: Diagnosis not present

## 2017-12-31 DIAGNOSIS — G40802 Other epilepsy, not intractable, without status epilepticus: Secondary | ICD-10-CM | POA: Diagnosis not present

## 2017-12-31 DIAGNOSIS — G4089 Other seizures: Secondary | ICD-10-CM | POA: Diagnosis not present

## 2017-12-31 DIAGNOSIS — T8571XA Infection and inflammatory reaction due to peritoneal dialysis catheter, initial encounter: Secondary | ICD-10-CM | POA: Diagnosis not present

## 2017-12-31 DIAGNOSIS — R404 Transient alteration of awareness: Secondary | ICD-10-CM | POA: Diagnosis not present

## 2017-12-31 DIAGNOSIS — G40909 Epilepsy, unspecified, not intractable, without status epilepticus: Secondary | ICD-10-CM | POA: Diagnosis not present

## 2017-12-31 DIAGNOSIS — I132 Hypertensive heart and chronic kidney disease with heart failure and with stage 5 chronic kidney disease, or end stage renal disease: Secondary | ICD-10-CM | POA: Diagnosis not present

## 2017-12-31 DIAGNOSIS — Z992 Dependence on renal dialysis: Secondary | ICD-10-CM | POA: Diagnosis not present

## 2017-12-31 DIAGNOSIS — R569 Unspecified convulsions: Secondary | ICD-10-CM | POA: Diagnosis not present

## 2017-12-31 DIAGNOSIS — E1122 Type 2 diabetes mellitus with diabetic chronic kidney disease: Secondary | ICD-10-CM | POA: Diagnosis not present

## 2017-12-31 DIAGNOSIS — G40901 Epilepsy, unspecified, not intractable, with status epilepticus: Secondary | ICD-10-CM | POA: Diagnosis not present

## 2018-01-01 DIAGNOSIS — Z8249 Family history of ischemic heart disease and other diseases of the circulatory system: Secondary | ICD-10-CM | POA: Diagnosis not present

## 2018-01-01 DIAGNOSIS — E1129 Type 2 diabetes mellitus with other diabetic kidney complication: Secondary | ICD-10-CM | POA: Diagnosis not present

## 2018-01-01 DIAGNOSIS — R569 Unspecified convulsions: Secondary | ICD-10-CM | POA: Diagnosis not present

## 2018-01-01 DIAGNOSIS — Z794 Long term (current) use of insulin: Secondary | ICD-10-CM | POA: Diagnosis not present

## 2018-01-01 DIAGNOSIS — Z79899 Other long term (current) drug therapy: Secondary | ICD-10-CM | POA: Diagnosis not present

## 2018-01-01 DIAGNOSIS — R0902 Hypoxemia: Secondary | ICD-10-CM | POA: Diagnosis present

## 2018-01-01 DIAGNOSIS — I132 Hypertensive heart and chronic kidney disease with heart failure and with stage 5 chronic kidney disease, or end stage renal disease: Secondary | ICD-10-CM | POA: Diagnosis present

## 2018-01-01 DIAGNOSIS — G40802 Other epilepsy, not intractable, without status epilepticus: Secondary | ICD-10-CM | POA: Diagnosis present

## 2018-01-01 DIAGNOSIS — L409 Psoriasis, unspecified: Secondary | ICD-10-CM | POA: Diagnosis present

## 2018-01-01 DIAGNOSIS — R740 Nonspecific elevation of levels of transaminase and lactic acid dehydrogenase [LDH]: Secondary | ICD-10-CM | POA: Diagnosis not present

## 2018-01-01 DIAGNOSIS — T8571XA Infection and inflammatory reaction due to peritoneal dialysis catheter, initial encounter: Secondary | ICD-10-CM | POA: Diagnosis not present

## 2018-01-01 DIAGNOSIS — E8889 Other specified metabolic disorders: Secondary | ICD-10-CM | POA: Diagnosis present

## 2018-01-01 DIAGNOSIS — A4901 Methicillin susceptible Staphylococcus aureus infection, unspecified site: Secondary | ICD-10-CM | POA: Diagnosis not present

## 2018-01-01 DIAGNOSIS — K219 Gastro-esophageal reflux disease without esophagitis: Secondary | ICD-10-CM | POA: Diagnosis present

## 2018-01-01 DIAGNOSIS — E1142 Type 2 diabetes mellitus with diabetic polyneuropathy: Secondary | ICD-10-CM | POA: Diagnosis present

## 2018-01-01 DIAGNOSIS — E889 Metabolic disorder, unspecified: Secondary | ICD-10-CM | POA: Diagnosis not present

## 2018-01-01 DIAGNOSIS — K659 Peritonitis, unspecified: Secondary | ICD-10-CM | POA: Diagnosis not present

## 2018-01-01 DIAGNOSIS — Z8614 Personal history of Methicillin resistant Staphylococcus aureus infection: Secondary | ICD-10-CM | POA: Diagnosis not present

## 2018-01-01 DIAGNOSIS — B659 Schistosomiasis, unspecified: Secondary | ICD-10-CM | POA: Diagnosis not present

## 2018-01-01 DIAGNOSIS — N186 End stage renal disease: Secondary | ICD-10-CM | POA: Diagnosis present

## 2018-01-01 DIAGNOSIS — G9340 Encephalopathy, unspecified: Secondary | ICD-10-CM | POA: Diagnosis not present

## 2018-01-01 DIAGNOSIS — Z792 Long term (current) use of antibiotics: Secondary | ICD-10-CM | POA: Diagnosis not present

## 2018-01-01 DIAGNOSIS — I120 Hypertensive chronic kidney disease with stage 5 chronic kidney disease or end stage renal disease: Secondary | ICD-10-CM | POA: Diagnosis not present

## 2018-01-01 DIAGNOSIS — E1122 Type 2 diabetes mellitus with diabetic chronic kidney disease: Secondary | ICD-10-CM | POA: Diagnosis present

## 2018-01-01 DIAGNOSIS — K658 Other peritonitis: Secondary | ICD-10-CM | POA: Diagnosis not present

## 2018-01-01 DIAGNOSIS — Z992 Dependence on renal dialysis: Secondary | ICD-10-CM | POA: Diagnosis not present

## 2018-01-01 DIAGNOSIS — T80212A Local infection due to central venous catheter, initial encounter: Secondary | ICD-10-CM | POA: Diagnosis not present

## 2018-01-01 DIAGNOSIS — G40409 Other generalized epilepsy and epileptic syndromes, not intractable, without status epilepticus: Secondary | ICD-10-CM | POA: Diagnosis not present

## 2018-01-01 DIAGNOSIS — D631 Anemia in chronic kidney disease: Secondary | ICD-10-CM | POA: Diagnosis present

## 2018-01-01 DIAGNOSIS — T8571XD Infection and inflammatory reaction due to peritoneal dialysis catheter, subsequent encounter: Secondary | ICD-10-CM | POA: Diagnosis not present

## 2018-01-01 DIAGNOSIS — E119 Type 2 diabetes mellitus without complications: Secondary | ICD-10-CM | POA: Diagnosis not present

## 2018-01-01 DIAGNOSIS — Z833 Family history of diabetes mellitus: Secondary | ICD-10-CM | POA: Diagnosis not present

## 2018-01-01 DIAGNOSIS — G40909 Epilepsy, unspecified, not intractable, without status epilepticus: Secondary | ICD-10-CM | POA: Diagnosis not present

## 2018-01-01 DIAGNOSIS — B9561 Methicillin susceptible Staphylococcus aureus infection as the cause of diseases classified elsewhere: Secondary | ICD-10-CM | POA: Diagnosis not present

## 2018-01-02 ENCOUNTER — Telehealth: Payer: Self-pay

## 2018-01-02 DIAGNOSIS — E1129 Type 2 diabetes mellitus with other diabetic kidney complication: Secondary | ICD-10-CM | POA: Diagnosis not present

## 2018-01-02 DIAGNOSIS — N186 End stage renal disease: Secondary | ICD-10-CM | POA: Diagnosis not present

## 2018-01-02 DIAGNOSIS — Z992 Dependence on renal dialysis: Secondary | ICD-10-CM | POA: Diagnosis not present

## 2018-01-02 NOTE — Telephone Encounter (Signed)
Please advise on day that works best for your schedule. I will call patient and schedule follow up.

## 2018-01-02 NOTE — Telephone Encounter (Signed)
Copied from [place] [id number]. Topic: Quick Communication - See Telephone Encounter >> Dec 30, 2017  3:39 PM Antonieta Iba C wrote: CRM for notification. See Telephone encounter for: 12/30/17.  Pt was just discharged from hospital and advised to follow up with PCP in 7 days, pcp will be out of the office. Not sure how to go about scheduling. Please assist pt and spouse further.   CB: [phone]

## 2018-01-02 NOTE — Telephone Encounter (Signed)
We could overbook next week?  I'm afraid that is the earliest we can see him

## 2018-01-04 NOTE — Telephone Encounter (Signed)
Left message to return call. Please schedule appointment for pt.

## 2018-01-06 MED ORDER — LEVETIRACETAM 500 MG PO TABS
500.00 | ORAL_TABLET | ORAL | Status: DC
Start: 2018-01-07 — End: 2018-01-06

## 2018-01-06 MED ORDER — AMLODIPINE BESYLATE 5 MG PO TABS
10.00 | ORAL_TABLET | ORAL | Status: DC
Start: 2018-01-06 — End: 2018-01-06

## 2018-01-06 MED ORDER — APREMILAST 30 MG PO TABS
30.00 | ORAL_TABLET | ORAL | Status: DC
Start: 2018-01-06 — End: 2018-01-06

## 2018-01-06 MED ORDER — ONDANSETRON HCL 4 MG/2ML IJ SOLN
4.00 | INTRAMUSCULAR | Status: DC
Start: ? — End: 2018-01-06

## 2018-01-06 MED ORDER — EPOETIN ALFA 10000 UNIT/ML IJ SOLN
10000.00 | INTRAMUSCULAR | Status: DC
Start: 2018-01-06 — End: 2018-01-06

## 2018-01-06 MED ORDER — LACOSAMIDE 50 MG PO TABS
75.00 | ORAL_TABLET | ORAL | Status: DC
Start: 2018-01-06 — End: 2018-01-06

## 2018-01-06 MED ORDER — CARVEDILOL 3.125 MG PO TABS
6.25 | ORAL_TABLET | ORAL | Status: DC
Start: 2018-01-06 — End: 2018-01-06

## 2018-01-06 MED ORDER — GLUCOSE 40 % PO GEL
15.00 g | ORAL | Status: DC
Start: ? — End: 2018-01-06

## 2018-01-06 MED ORDER — CALCIUM ACETATE (PHOS BINDER) 667 MG PO CAPS
1334.00 | ORAL_CAPSULE | ORAL | Status: DC
Start: 2018-01-06 — End: 2018-01-06

## 2018-01-06 MED ORDER — INSULIN LISPRO 100 UNIT/ML ~~LOC~~ SOLN
2.00 | SUBCUTANEOUS | Status: DC
Start: 2018-01-06 — End: 2018-01-06

## 2018-01-06 MED ORDER — GENERIC EXTERNAL MEDICATION
1.00 g | Status: DC
Start: ? — End: 2018-01-06

## 2018-01-06 MED ORDER — LORAZEPAM 2 MG/ML IJ SOLN
1.00 | INTRAMUSCULAR | Status: DC
Start: ? — End: 2018-01-06

## 2018-01-06 MED ORDER — GENERIC EXTERNAL MEDICATION
1.00 | Status: DC
Start: ? — End: 2018-01-06

## 2018-01-06 MED ORDER — DEXTROSE 50 % IV SOLN
12.00 g | INTRAVENOUS | Status: DC
Start: ? — End: 2018-01-06

## 2018-01-07 DIAGNOSIS — N186 End stage renal disease: Secondary | ICD-10-CM | POA: Diagnosis not present

## 2018-01-07 DIAGNOSIS — D509 Iron deficiency anemia, unspecified: Secondary | ICD-10-CM | POA: Diagnosis not present

## 2018-01-07 DIAGNOSIS — N2581 Secondary hyperparathyroidism of renal origin: Secondary | ICD-10-CM | POA: Diagnosis not present

## 2018-01-07 DIAGNOSIS — D631 Anemia in chronic kidney disease: Secondary | ICD-10-CM | POA: Diagnosis not present

## 2018-01-07 DIAGNOSIS — A4101 Sepsis due to Methicillin susceptible Staphylococcus aureus: Secondary | ICD-10-CM | POA: Diagnosis not present

## 2018-01-09 ENCOUNTER — Telehealth: Payer: Self-pay | Admitting: Family Medicine

## 2018-01-09 ENCOUNTER — Other Ambulatory Visit: Payer: Self-pay | Admitting: *Deleted

## 2018-01-09 DIAGNOSIS — Z794 Long term (current) use of insulin: Secondary | ICD-10-CM | POA: Diagnosis not present

## 2018-01-09 DIAGNOSIS — R569 Unspecified convulsions: Secondary | ICD-10-CM | POA: Diagnosis not present

## 2018-01-09 DIAGNOSIS — I5030 Unspecified diastolic (congestive) heart failure: Secondary | ICD-10-CM | POA: Diagnosis not present

## 2018-01-09 DIAGNOSIS — I132 Hypertensive heart and chronic kidney disease with heart failure and with stage 5 chronic kidney disease, or end stage renal disease: Secondary | ICD-10-CM | POA: Diagnosis not present

## 2018-01-09 DIAGNOSIS — D631 Anemia in chronic kidney disease: Secondary | ICD-10-CM | POA: Diagnosis not present

## 2018-01-09 DIAGNOSIS — N186 End stage renal disease: Secondary | ICD-10-CM | POA: Diagnosis not present

## 2018-01-09 DIAGNOSIS — T827XXA Infection and inflammatory reaction due to other cardiac and vascular devices, implants and grafts, initial encounter: Secondary | ICD-10-CM | POA: Diagnosis not present

## 2018-01-09 DIAGNOSIS — E1122 Type 2 diabetes mellitus with diabetic chronic kidney disease: Secondary | ICD-10-CM | POA: Diagnosis not present

## 2018-01-09 DIAGNOSIS — K219 Gastro-esophageal reflux disease without esophagitis: Secondary | ICD-10-CM | POA: Diagnosis not present

## 2018-01-09 DIAGNOSIS — Z992 Dependence on renal dialysis: Secondary | ICD-10-CM | POA: Diagnosis not present

## 2018-01-09 DIAGNOSIS — E1142 Type 2 diabetes mellitus with diabetic polyneuropathy: Secondary | ICD-10-CM | POA: Diagnosis not present

## 2018-01-09 NOTE — Telephone Encounter (Signed)
Patient wife calling back, requesting patient to be seen this week, no 30 min appts available with Dr Lorelei Pont. Please advise.. Call back [phone]

## 2018-01-09 NOTE — Telephone Encounter (Signed)
Copied from [place] [id number]. Topic: General - Other >> Jan 09, 2018 12:38 PM Cecelia Byars, NT wrote: Reason for CRM: Tommi Rumps from [facility] called to see if she can  can get verbal orders  for nursing for 9 weeks , and also check on his wound care orders please  [phone]

## 2018-01-10 DIAGNOSIS — T827XXA Infection and inflammatory reaction due to other cardiac and vascular devices, implants and grafts, initial encounter: Secondary | ICD-10-CM | POA: Diagnosis not present

## 2018-01-10 DIAGNOSIS — N2581 Secondary hyperparathyroidism of renal origin: Secondary | ICD-10-CM | POA: Diagnosis not present

## 2018-01-10 DIAGNOSIS — N186 End stage renal disease: Secondary | ICD-10-CM | POA: Diagnosis not present

## 2018-01-10 DIAGNOSIS — I5030 Unspecified diastolic (congestive) heart failure: Secondary | ICD-10-CM | POA: Diagnosis not present

## 2018-01-10 DIAGNOSIS — I132 Hypertensive heart and chronic kidney disease with heart failure and with stage 5 chronic kidney disease, or end stage renal disease: Secondary | ICD-10-CM | POA: Diagnosis not present

## 2018-01-10 DIAGNOSIS — E1122 Type 2 diabetes mellitus with diabetic chronic kidney disease: Secondary | ICD-10-CM | POA: Diagnosis not present

## 2018-01-10 DIAGNOSIS — D631 Anemia in chronic kidney disease: Secondary | ICD-10-CM | POA: Diagnosis not present

## 2018-01-10 DIAGNOSIS — A4101 Sepsis due to Methicillin susceptible Staphylococcus aureus: Secondary | ICD-10-CM | POA: Diagnosis not present

## 2018-01-10 DIAGNOSIS — R569 Unspecified convulsions: Secondary | ICD-10-CM | POA: Diagnosis not present

## 2018-01-10 DIAGNOSIS — D509 Iron deficiency anemia, unspecified: Secondary | ICD-10-CM | POA: Diagnosis not present

## 2018-01-10 NOTE — Telephone Encounter (Signed)
Tried [employer] with verbal orders. No answer, left detailed message to give verbal orders.

## 2018-01-10 NOTE — Telephone Encounter (Signed)
Patient has been scheduled for next week to see pcp for hospital follow up.

## 2018-01-12 DIAGNOSIS — D509 Iron deficiency anemia, unspecified: Secondary | ICD-10-CM | POA: Diagnosis not present

## 2018-01-12 DIAGNOSIS — A4101 Sepsis due to Methicillin susceptible Staphylococcus aureus: Secondary | ICD-10-CM | POA: Diagnosis not present

## 2018-01-12 DIAGNOSIS — D631 Anemia in chronic kidney disease: Secondary | ICD-10-CM | POA: Diagnosis not present

## 2018-01-12 DIAGNOSIS — N186 End stage renal disease: Secondary | ICD-10-CM | POA: Diagnosis not present

## 2018-01-12 DIAGNOSIS — N2581 Secondary hyperparathyroidism of renal origin: Secondary | ICD-10-CM | POA: Diagnosis not present

## 2018-01-13 DIAGNOSIS — E1122 Type 2 diabetes mellitus with diabetic chronic kidney disease: Secondary | ICD-10-CM | POA: Diagnosis not present

## 2018-01-13 DIAGNOSIS — I5030 Unspecified diastolic (congestive) heart failure: Secondary | ICD-10-CM | POA: Diagnosis not present

## 2018-01-13 DIAGNOSIS — I132 Hypertensive heart and chronic kidney disease with heart failure and with stage 5 chronic kidney disease, or end stage renal disease: Secondary | ICD-10-CM | POA: Diagnosis not present

## 2018-01-13 DIAGNOSIS — N186 End stage renal disease: Secondary | ICD-10-CM | POA: Diagnosis not present

## 2018-01-13 DIAGNOSIS — T827XXA Infection and inflammatory reaction due to other cardiac and vascular devices, implants and grafts, initial encounter: Secondary | ICD-10-CM | POA: Diagnosis not present

## 2018-01-13 DIAGNOSIS — R569 Unspecified convulsions: Secondary | ICD-10-CM | POA: Diagnosis not present

## 2018-01-14 DIAGNOSIS — D631 Anemia in chronic kidney disease: Secondary | ICD-10-CM | POA: Diagnosis not present

## 2018-01-14 DIAGNOSIS — D509 Iron deficiency anemia, unspecified: Secondary | ICD-10-CM | POA: Diagnosis not present

## 2018-01-14 DIAGNOSIS — N186 End stage renal disease: Secondary | ICD-10-CM | POA: Diagnosis not present

## 2018-01-14 DIAGNOSIS — N2581 Secondary hyperparathyroidism of renal origin: Secondary | ICD-10-CM | POA: Diagnosis not present

## 2018-01-14 DIAGNOSIS — A4101 Sepsis due to Methicillin susceptible Staphylococcus aureus: Secondary | ICD-10-CM | POA: Diagnosis not present

## 2018-01-14 NOTE — Progress Notes (Signed)
[place] at [facility] [address] [place], [place] [address] [phone]  Date:  01/18/2018   Name:  Bryan Wilkerson   DOB:  Nov 18, 1965   MRN:  [id number]  PCP:  [name], MD    Chief Complaint: Hospitalization Follow-up   History of Present Illness:  Bryan Wilkerson is a 52 y.o. very pleasant male patient who presents with the following:  Hospital follow-up today.  I last saw this pt last November, also for hospital follow-up.  From recent admission at [facility]:  Admit date: 12/31/2017 Discharge date: 01/06/2018   Discharge Service: [facility] Hospitalist Discharge Attending Physician: Christa See, DO  Discharge Diagnoses: Principal Problem: Seizure [facility]) Active Problems: ESRD (end stage renal disease) ([place]) HTN (hypertension) Diabetic polyneuropathy associated with type 2 diabetes mellitus ([place]) Essential hypertension, benign Peritonitis due to infected peritoneal dialysis catheter ([place]) Postictal state ([place]) Anemia of chronic kidney failure, stage 5 ([place]) Resolved Problems: * No resolved hospital problems. [facility] Course:   From Admission H&P: Patient is lethargic and unable to provide history. History was obtained from his wife who is at bedside. 52 year old male with history of ESRD on HD-TTS schedule, also has history of insulin-dependent type 2 diabetes, hypertension, psoriasis and hospitalization twice recently for sepsis, PD catheter infection causing peritonitis with MSSA, new onset seizures. He was initially hospitalized from 12/17/2017 to 12/23/2017 which is when he had recurrent seizures-new onset. Was evaluated by neurology team. Had MRI of the brain which was nonacute. EEG was unremarkable. Patient was initially started on Depakote. He was discharged to home on 12/23/2017 only to return on 12/27/2017 with recurrent seizure activity. No significant epileptiform activity found on EEG but showed mild  background slowing likely from toxic metabolic encephalopathy. Patient was stabilized, AED therapy was optimized and discharged to home on Keppra. Depakote was discontinued because of suspected hepatotoxicity. Patient was discharged yesterday and after going home, per wife, patient was fairly alert and conversant. Had his dinner and nighttime medications and then wife noted that his head was hyperextended and he started having seizure-like head movements, when she tried to call him he was just staring at her. He did not have generalized tonic-clonic activity this time. His wife was concerned about recurrence of seizure activity and hence EMS was called. EMS personnel gave him 2.5 mg of Versed and brought him to the ER. He became increasingly somnolent after Versed. They gave him 1 mg of Narcan for unclear reasons. He was placed on oxygen because of hypoxia.  Here in the ER, patient continued to be lethargic. Arouses to voice but unable to follow commands or participate in conversation. Hemodynamically otherwise stable. Has not had any more seizure-like activity. Routine lab work is remarkable for ESRD, chronic anemia, LFTs are trending down, chest x-ray shows cardiomegaly and fluid overload/edema with overall improvement compared to prior. CT of the head is nonacute. EKG shows sinus bradycardia with rate of 55 bpm without acute changes. Admission was requested for observation and further evaluation of seizures. By the time of my exam, he is lethargic, arouses to voice but quickly dozes off. Maintaining airway. Hemodynamically otherwise stable and afebrile. Does not appear to be in any severe distress.  Brief Hospital Course: This is a 52 y.o.malewith a PMH significant for, but not limited to, ESRD (on HD TTS), HTN, DM2, and seizure who presents with seizure-like activity.  Recent diagnosis of tonic-clonic seizure disorder Hypoxia and hypoxemia -Neurology  consulted this admission. Keppra level noted to  be low, so he was given bolus. Cont on PTA Keppra. Started on Vimpat. -He was on LTM during this admission, no epileptiform activity seen, however, he did have some events that occurred while he was off LTM. Neurology felt staring spells were not concerning for seizures. It was noted that pt was hypoxic during these events, and prior to the staring episodes. Episodes not followed by post-ictal state. An ABG was checked and showed pt clearly hypoxemic. A CXR was done that showed mild edema likely 2/2 volume overload from his ESRD. His Hgb was noted to be around 7, so he was transfused on unit PRBC on 7/4. His medical record reviewed and noted that his TTE from 2015 showed moderate pulm hypertension. Pt without any history of lung disease, however, his wife reported that he does stop breathing in his sleep at times and she has to wake him up, so underlying undiagnosed OSA was suspected. Pt never had any CP/SOB, and he had no hemodynamic instability or respiratory distress to suggest underlying PE or other acute pulmonary process. He was ultimately determined to be medically stable for d/c on 01/06/2018 with plan to f/u with cardiology and pulmonology, and recommend OP sleep study. He was d/c'd with home oxygen with plan to wean as OP. Neurology will f/u with him in 2 weeks.   Transaminitis -LFTs not to have nearly normalized during admission - pt had no abdominal pain, likely 2/2 Depakote that was d/c'd on previous admission   ESRD (on HD TTS) -nephrology following for routine dialysis on schedule days, HD done routinely while in hospital  Recent dx of peritonitis with MSSA likely 2/2 PD catheter infection -Given pt's multiple episodes of staring spells c/w possible seizure, his Acenf switched to vancomycin on 7/2 as noted above given concern for possible lowering of seizure threshold. ID was consulted this admission, and noted this to be rare, but abx was switched to remove it from the clinical picture as  a possible inducer of seizure activity. Per infectious disease, patient will need to continue IV vancomycin through January 16, 2018. This should be continued at [facility] with HD therapy at time of hospital discharge.  DM2 -Last hemoglobin A1c 11.9 in 11/2016 -Glucoses well conrolled during hospitalization -Continsulin sliding scale and accuchecks ACHS  HTN  -well controlled during hospitalization,continue PTA Coreg and Norvasc  Psoriasis -Pt on Otezla PTA, non-formulary here  Post Discharge Follow Up Issues:  - As highlighted above  Procedures: - Long term monitoring    Pt notes that since he got home he has continued to have seizure activity. His wife will notice that he will seem off balance and shake, he may or may not lose consciousness  He saw pulmonology 2 days ago- they are doing a sleep test He is using oxygen 3L 24 hours a day right now He will see neurology He finished up his IV vanc He does dialysis TTS schedule They did a fistulagram yesterday per his dialysis center.  He is not sure how this looked as of yet No fever noted He is eating well Overall he feels up and down- somedays he gets really frustrated and just wants to have answers as to why he is so sick  His endocrinology is Dr. Chalmers Cater   BP Readings from Last 3 Encounters:  01/18/18 (!) 144/70  01/16/18 (!) 142/70  11/23/17 (!) 155/78  for right now we don't know how long he will be  on oxygen  The ultimate plan is a kidney transplant  Franke denies feeling depressed His wife Jacob Moores is here with him today and contributes to the history  Their children are 95, 35, 55 and 17- 2 boys and 2 girls  They have 2 grands so far  He needs to have a screening colonoscopy as part of his pre- kidney transplant eval. However we wonder if cologuard might be ok- at this time I don't think GI would be willing to do a screening colonoscopy due to excess risks  They also need referral to cardiology according to  their pulmonologist- I will take care of this referral for them  Patient Active Problem List   Diagnosis Date Noted  . Fatigue 01/16/2018  . Uremia 08/25/2017  . Altered mental status 08/25/2017  . Sepsis ([place]) 08/02/2017  . Cellulitis and abscess of trunk 08/02/2017  . Loculated pleural effusion 04/27/2017  . Pneumonia 04/19/2017  . Diabetes mellitus due to underlying condition, uncontrolled, with stage 4 chronic kidney disease, with long-term current use of insulin ([place]) 01/13/2017  . Seizure ([place]) 11/23/2016  . Hypocalcemia 11/23/2016  . ESRD on dialysis ([place]) 11/16/2013  . Shortness of breath 10/31/2013  . CHF (congestive heart failure), NYHA class II ([place]) 12/29/2012  . Essential hypertension, benign 12/29/2012  . Hypokalemia 12/29/2012  . Anemia 12/29/2012    Past Medical History:  Diagnosis Date  . Anemia   . CHF (congestive heart failure) ([place])   . Diabetic retinopathy ([place])   . ESRD on peritoneal dialysis ([place])    "7 days/week" (04/20/2017)  . Hypertension   . Pneumonia 2016; 04/19/2017  . Psoriasis   . Type I diabetes mellitus ([place])     Past Surgical History:  Procedure Laterality Date  . AV FISTULA PLACEMENT Right 12/05/2013   Procedure: RADIOCEPHALIC VS. BRACHIOCEPHALIC ARTERIOVENOUS (AV) FISTULA CREATION;  Surgeon: Conrad Premont, MD;  Location: [facility];  Service: Vascular;  Laterality: Right;  . AV FISTULA PLACEMENT Left 07/20/2016   Procedure: LEFT ARM RADIOCEPHALIC ARTERIOVENOUS (AV) FISTULA CREATION;  Surgeon: Waynetta Sandy, MD;  Location: [facility];  Service: Vascular;  Laterality: Left;  . CATARACT EXTRACTION W/ INTRAOCULAR LENS  IMPLANT, BILATERAL Bilateral   . EYE SURGERY    . FISTULOGRAM Right 07/16/2014   Procedure: FISTULOGRAM;  Surgeon: Conrad Okolona, MD;  Location: [facility];  Service: Vascular;  Laterality: Right;  . IR THORACENTESIS ASP PLEURAL SPACE W/IMG GUIDE  05/02/2017  . LIGATION OF COMPETING BRANCHES OF ARTERIOVENOUS FISTULA Right 07/16/2014    Procedure: LIGATION OF COMPETING BRANCHES OF ARTERIOVENOUS FISTULA;  Surgeon: Conrad Lily Lake, MD;  Location: [facility];  Service: Vascular;  Laterality: Right;  . PERITONEAL CATHETER INSERTION Left ~ 08/2016  . PORT-A-CATH REMOVAL  2017  . PORTA CATH INSERTION Right    "for hemodialysis"  . RETINAL DETACHMENT SURGERY Right     Social History   Tobacco Use  . Smoking status: Never Smoker  . Smokeless tobacco: Never Used  Substance Use Topics  . Alcohol use: No    Alcohol/week: 0.0 oz  . Drug use: No    Family History  Problem Relation Age of Onset  . Hypertension Mother   . Heart attack Mother   . Chronic Renal Failure Neg Hx   . Diabetes Neg Hx   . Stroke Neg Hx   . Cancer Neg Hx     Allergies  Allergen Reactions  . Penicillins Other (See Comments)    UNSPECIFIED REACTION FROM CHILDHOOD  Has patient had a PCN reaction causing immediate rash, facial/tongue/throat swelling, SOB or lightheadedness with hypotension:Yes Has patient had a PCN reaction causing severe rash involving mucus membranes or skin necrosis:No Has patient had a PCN reaction that required hospitalization:Yes Has patient had a PCN reaction occurring within the last 10 years:No If all of the above answers are "NO", then may proceed with Cephalosporin use.      Medication list has been reviewed and updated.  Current Outpatient Medications on File Prior to Visit  Medication Sig Dispense Refill  . amLODipine (NORVASC) 5 MG tablet Take 5 mg by mouth daily.     . B Complex-C-Folic Acid (NEPHRO-VITE PO) Take 1 tablet by mouth every morning.    . calcitRIOL (ROCALTROL) 0.25 MCG capsule Take 2 capsules (0.5 mcg total) by mouth daily. (Patient taking differently: Take 0.25 mcg by mouth 3 (three) times daily. ) 30 capsule 0  . carvedilol (COREG) 6.25 MG tablet Take 1 tablet (6.25 mg total) by mouth 2 (two) times daily with a meal. (Patient taking differently: Take 25 mg by mouth 2 (two) times daily with a meal. ) 60  tablet 0  . LEVEMIR FLEXTOUCH 100 UNIT/ML Pen Inject 2-8 Units into the skin daily at 10 pm. Sliding Scale    . NOVOLOG FLEXPEN 100 UNIT/ML FlexPen Sliding scale over  150-315 1 units    . OTEZLA 30 MG TABS     . sucroferric oxyhydroxide (VELPHORO) 500 MG chewable tablet Chew 500 mg by mouth 3 (three) times daily with meals.    . triamcinolone ointment (KENALOG) 0.1 % Apply 1 application topically as needed (psorasis).      No current facility-administered medications on file prior to visit.     Review of Systems: As per HPI- otherwise negative.    Physical Examination: Vitals:   01/18/18 1208 01/18/18 1227  BP: (!) 110/58 (!) 144/70  Pulse: 65   Resp: 18   Temp: 98.6 F (37 C)   SpO2: 95%    Vitals:   01/18/18 1208  Weight: 185 lb 9.6 oz (84.2 kg)  Height: 6\' 2"  (1.88 m)   Body mass index is 23.83 kg/m. Ideal Body Weight: Weight in (lb) to have BMI = 25: 194.3  GEN: WDWN, NAD, Non-toxic, A & O x 3, appears older than age, using walker today, on [place] oxygen  HEENT: Atraumatic, Normocephalic. Neck supple. No masses, No LAD. Ears and Nose: No external deformity. CV: RRR, No M/G/R. No JVD. No thrill. No extra heart sounds. PULM: CTA B, no wheezes, crackles, rhonchi. No retractions. No resp. distress. No accessory muscle use. ABD: S, NT, ND, +BS. No rebound. No HSM. EXTR: No c/c/e NEURO Normal gait.  PSYCH: Normally interactive. Conversant. Not depressed or anxious appearing.  Calm demeanor.    Assessment and Plan: Hospital discharge follow-up - Plan: Ambulatory referral to Cardiology  ESRD (end stage renal disease) ([place])  Seizure disorder ([place])  Hypertension, unspecified type - Plan: Ambulatory referral to Cardiology  Diabetes mellitus due to underlying condition, uncontrolled, with stage 4 chronic kidney disease, with long-term current use of insulin ([place])  Hypoxemia - Plan: Ambulatory referral to Cardiology  Screening for colon cancer  Following up from recent  admission x2.  He has ESRD on dialysis and developed seizures and infection, as well as new oxygen requirement.  Julis is feeling ok since his admission and has follow-up with all the appropriate specialists.  I ordered a cologuard for him today Placed referral to cardiology as  requested   Signed Lamar Blinks, MD

## 2018-01-16 ENCOUNTER — Ambulatory Visit (INDEPENDENT_AMBULATORY_CARE_PROVIDER_SITE_OTHER)
Admission: RE | Admit: 2018-01-16 | Discharge: 2018-01-16 | Disposition: A | Discharge status: Home / Self Care | Payer: Medicare Other | Source: Ambulatory Visit | Attending: Pulmonary Disease | Admitting: Pulmonary Disease

## 2018-01-16 ENCOUNTER — Encounter: Payer: Self-pay | Admitting: Pulmonary Disease

## 2018-01-16 ENCOUNTER — Ambulatory Visit (INDEPENDENT_AMBULATORY_CARE_PROVIDER_SITE_OTHER): Payer: Medicare Other | Admitting: Pulmonary Disease

## 2018-01-16 VITALS — BP 142/70 | HR 86 | Ht 74.0 in | Wt 188.0 lb

## 2018-01-16 DIAGNOSIS — T827XXA Infection and inflammatory reaction due to other cardiac and vascular devices, implants and grafts, initial encounter: Secondary | ICD-10-CM | POA: Diagnosis not present

## 2018-01-16 DIAGNOSIS — R5383 Other fatigue: Secondary | ICD-10-CM | POA: Diagnosis not present

## 2018-01-16 DIAGNOSIS — Z992 Dependence on renal dialysis: Secondary | ICD-10-CM | POA: Diagnosis not present

## 2018-01-16 DIAGNOSIS — R0602 Shortness of breath: Secondary | ICD-10-CM

## 2018-01-16 DIAGNOSIS — N186 End stage renal disease: Secondary | ICD-10-CM

## 2018-01-16 DIAGNOSIS — R569 Unspecified convulsions: Secondary | ICD-10-CM | POA: Diagnosis not present

## 2018-01-16 DIAGNOSIS — I132 Hypertensive heart and chronic kidney disease with heart failure and with stage 5 chronic kidney disease, or end stage renal disease: Secondary | ICD-10-CM | POA: Diagnosis not present

## 2018-01-16 DIAGNOSIS — E1122 Type 2 diabetes mellitus with diabetic chronic kidney disease: Secondary | ICD-10-CM | POA: Diagnosis not present

## 2018-01-16 DIAGNOSIS — I5030 Unspecified diastolic (congestive) heart failure: Secondary | ICD-10-CM | POA: Diagnosis not present

## 2018-01-16 DIAGNOSIS — T82858A Stenosis of vascular prosthetic devices, implants and grafts, initial encounter: Secondary | ICD-10-CM | POA: Diagnosis not present

## 2018-01-16 DIAGNOSIS — I871 Compression of vein: Secondary | ICD-10-CM | POA: Diagnosis not present

## 2018-01-16 NOTE — Progress Notes (Signed)
Please let the patient know that his chest x-ray has come back showing no acute changes.  Ensure the patient has follow-up with neurology as this is a very important part of his care.  Wyn Quaker, FNP

## 2018-01-16 NOTE — Assessment & Plan Note (Signed)
Chest xray today.

## 2018-01-16 NOTE — Patient Instructions (Addendum)
Patient needs to follow-up with neurology >>> [place] 18 today Chest xray today  Continue oxygen therapy 3 L continuous for right now >>> Will work to wean after cleared by neurology >>> Discharged on [facility] tank as patient's oxygen tank ran out on arrival to our office Continue follow-up with cardiology in [facility] Follow-up with Dr. Halford Chessman for first available with a new patient appointment (this needs to be 30-minute appointment) in [facility]       Please contact the office if your symptoms worsen or you have concerns that you are not improving.   Thank you for choosing [facility] for your healthcare, and for allowing [place] to partner with you on your healthcare journey. I am thankful to be able to provide care to you today.   Wyn Quaker FNP-C

## 2018-01-16 NOTE — Progress Notes (Signed)
@Patient  ID: Bryan Wilkerson, male    DOB: February 24, 1966, 52 y.o.   MRN: [id number]  Chief Complaint  Patient presents with  . Hospitalization Follow-up    Patient was having recurring seizures. Recieves IV Vanc at dialysis. CBG 145. Seizure in office, oxygen ran out. Needs sleep study per patient and discharge summary.     Referring provider: Copland, Gay Filler, MD  HPI: 52 yo male never smoker.  2015 TEE shows moderate pulmonary hypertension.  Patient with suspected obstructive sleep apnea.  Former patient of IT consultant.  Recent discharge from [facility] with seizures and 3 L continuous home O2.  Will now establish with Dr. Halford Chessman.   Recent  Pulmonary Encounters:   04/27/2017 Hospital Follow Up- Admission 04/19/2017-04/22/2017 for CAP pneumonia with left pleural effusion.He declined thoracentesis as an inpatient.: HPI at that admission was as follows: 52 y.o. male with known history of end-stage renal disease on home peritoneal dialysis. Patient also has a known history of diastolic congestive heart failure. He endorses a sick contact over the weekend and then on Sunday began to develop pleuritic type chest pain on his left. He also noticed increasing dyspnea. Denies any productive cough, fever, chills, or sweats. Denies any abdominal pain or nausea. Denies any other arthralgias or myalgias. Patient's left-sided pleuritic pain has since resolved after admission. Denies any new rashes or bruising. Patient was subsequently admitted to hospital and placed on broad-spectrum antibiotic coverage. Initially patient did have hypoxia but has since weaned to room air. Chest imaging identified a moderate size left pleural effusion that prompted pulmonary consultation. Patient reports he did have the influenza vaccine this Fall. Pt. Presents today for follow up. He was treated with 10 days of antibiotics to finish  up as outpatient on Doxycycline, and prednisone. He declined a thoracentesis at  the time of hospitalization.He states at the time he was too overwhelmed to have it done. He is compliant with his doxycycline since discharge.He states he has no fever or cough, but he has pain across the lower half of his back, primarily left sided, but it " Moves around" He states the pain makes him nauseated. He states he  is having regular bowel movements, but is complaining of lower abdominal pain.He states this pain just started when he stopped using his heating pad his this morning . He states he has no shortness of breath, just pain.[name]  He states he is compliant with his peritoneal dialysis.He denies fever, orthopnea, or hemoptysis.      Tests:   Imaging:  IMAGING/STUDIES: CT CHEST W/O 04/22/17:  Moderate-sized left pleural effusion with adjacent compressive atelectasis versus consolidation. Air bronchograms are present with a left lower lobe. Questionable loculation on my review of imaging. No pathologic mediastinal adenopathy. No pericardial effusion.  04/27/2017  CXR The lungs are well aerated bilaterally with the exception of a large somewhat loculated appearing left pleural effusion. This has enlarged somewhat in the interval from the prior exam. Underlying left lower lobe consolidation is again seen. No new focal abnormality is noted. Vascular stenting on right is noted. IMPRESSION: Slight increase in degree of left pleural effusion when compared with the prior study. Underlying infiltrate is present as well.    Cardiac:  2015 TEE showed moderate pulmonary hypertension  Labs:   Micro:   Chart Review:   Chart review reveals that seizures initially started on 12/17/2017.  Was evaluated by neurology team.  Had a MRI of the brain which was nonacute.  EEG was  unremarkable.  Patient was initially started on Depakote.  He was discharged home on 12/23/2017.  Patient presented back to the hospital on 12/27/2017 with recurrent seizure activity.  No significant epileptiform  activity found to EEG but showed mild background slowing likely from toxic metabolic encephalopathy.  Depakote was stopped and patient was started on Keppra.  Patient then presented back to the ER on 12/31/2017.  CT the head was nonacute.  EKG shows bradycardia with a rate of 55 bpm.  Patient was placed on oxygen due to hypoxia.  Neurology then started patient on Keppra as well as Vimpat.  Patient was given a bolus of Keppra as level was low.  Patient was deemed medically stable on 01/06/2018 and was discharged with plans to follow-up with cardiology and pulmonology.  They recommended a sleep study.  Patient was discharged on home oxygen with plan to wean as outpatient neurology deems appropriate.  Unfortunately patient has not followed up with outpatient neurology reports that he does not have a scheduled appointment.  Per chart review no seizures that have been observed to been followed with a post ictal state.  Per chart review patient and spouse continue to report that he is has ongoing daytime fatigue as well as apneic episodes when he sleeping.  Patient has not been formally diagnosed with obstructive sleep apnea.  They are wondering if we can get a home sleep study performed today.    01/16/18 OV  52 year old patient presenting to office today for hospital follow-up.  Patient was seen at [facility] for seizures.  Patient was discharged 2 weeks ago.  Patient was discharged on oxygen as according to patient as well as spouse that when patient comes off of oxygen he has seizures.  Patient has not had outpatient follow-up with neurology.  Patient is on end-stage dialysis on a Tuesday Thursday Saturday schedule.  Patient reports he is been making all those appointments.  Patient is currently getting dialysis through a right upper chest HD cath.  Patient with new graft on left upper extremity.  Renal to start accessing new graft next Tuesday per patient.  On arrival to our office today patient  was on oxygen but did not realize the tank was empty.  Patient proceeded to have a seizure as he was being roomed.  I did not observe this.  CBG was 145.  As soon as patient was placed on oxygen patient became responsive.  There was no postictal state.    Allergies  Allergen Reactions  . Penicillins Other (See Comments)    UNSPECIFIED REACTION FROM CHILDHOOD Has patient had a PCN reaction causing immediate rash, facial/tongue/throat swelling, SOB or lightheadedness with hypotension:Yes Has patient had a PCN reaction causing severe rash involving mucus membranes or skin necrosis:No Has patient had a PCN reaction that required hospitalization:Yes Has patient had a PCN reaction occurring within the last 10 years:No If all of the above answers are "NO", then may proceed with Cephalosporin use.      Immunization History  Administered Date(s) Administered  . PPD Test 05/23/2017, 05/23/2017  . Pneumococcal Polysaccharide-23 12/30/2012    Past Medical History:  Diagnosis Date  . Anemia   . CHF (congestive heart failure) ([place])   . Diabetic retinopathy ([place])   . ESRD on peritoneal dialysis ([place])    "7 days/week" (04/20/2017)  . Hypertension   . Pneumonia 2016; 04/19/2017  . Psoriasis   . Type I diabetes mellitus ([place])     Tobacco History: Social History  Tobacco Use  Smoking Status Never Smoker  Smokeless Tobacco Never Used   Counseling given: Not Answered   Outpatient Encounter Medications as of 01/16/2018  Medication Sig  . amLODipine (NORVASC) 5 MG tablet Take 5 mg by mouth daily.   . B Complex-C-Folic Acid (NEPHRO-VITE PO) Take 1 tablet by mouth every morning.  . calcitRIOL (ROCALTROL) 0.25 MCG capsule Take 2 capsules (0.5 mcg total) by mouth daily. (Patient taking differently: Take 0.25 mcg by mouth 3 (three) times daily. )  . carvedilol (COREG) 6.25 MG tablet Take 1 tablet (6.25 mg total) by mouth 2 (two) times daily with a meal. (Patient taking differently: Take 25 mg  by mouth 2 (two) times daily with a meal. )  . LEVEMIR FLEXTOUCH 100 UNIT/ML Pen Inject 2-8 Units into the skin daily at 10 pm. Sliding Scale  . NOVOLOG FLEXPEN 100 UNIT/ML FlexPen Sliding scale over  150-315 1 units  . OTEZLA 30 MG TABS   . sucroferric oxyhydroxide (VELPHORO) 500 MG chewable tablet Chew 500 mg by mouth 3 (three) times daily with meals.  . triamcinolone ointment (KENALOG) 0.1 % Apply 1 application topically as needed (psorasis).    No facility-administered encounter medications on file as of 01/16/2018.      Review of Systems  Review of Systems  Constitutional: Negative for activity change, chills, fatigue, fever and unexpected weight change.  HENT: Negative for postnasal drip, rhinorrhea, sinus pressure, sinus pain, sneezing and sore throat.   Respiratory: Positive for shortness of breath (occasional ). Negative for cough and wheezing.   Cardiovascular: Negative for chest pain and palpitations.  Gastrointestinal: Negative for constipation, diarrhea, nausea and vomiting.  Genitourinary: Negative for hematuria and urgency.  Musculoskeletal: Negative for arthralgias.  Skin: Negative for color change.  Neurological: Positive for seizures (whenever patient is without oxygen ). Negative for dizziness and headaches.  Psychiatric/Behavioral: Negative for dysphoric mood. The patient is not nervous/anxious.   All other systems reviewed and are negative.   Reports: snoring, apneic episodes  He denies sleep walking, sleep talking, bruxism, or nightmares.  There is no history of restless legs.  He denies sleep hallucinations, sleep paralysis, or cataplexy.  Epworth: 18  Physical Exam  BP (!) 142/70   Pulse 86   Ht 6\' 2"  (1.88 m)   Wt 188 lb (85.3 kg)   SpO2 97%   BMI 24.14 kg/m   Wt Readings from Last 5 Encounters:  01/16/18 188 lb (85.3 kg)  11/23/17 186 lb (84.4 kg)  08/27/17 193 lb 12.6 oz (87.9 kg)  08/07/17 211 lb 3.2 oz (95.8 kg)  05/05/17 196 lb (88.9 kg)      Physical Exam  Constitutional: He is oriented to person, place, and time and well-developed, well-nourished, and in no distress. Vital signs are normal. No distress.  HENT:  Head: Normocephalic and atraumatic.  Right Ear: Hearing, tympanic membrane, external ear and ear canal normal.  Left Ear: Hearing, tympanic membrane, external ear and ear canal normal.  Nose: Nose normal. Right sinus exhibits no maxillary sinus tenderness and no frontal sinus tenderness. Left sinus exhibits no maxillary sinus tenderness and no frontal sinus tenderness.  Mouth/Throat: Uvula is midline and oropharynx is clear and moist. No oropharyngeal exudate.  Eyes: Pupils are equal, round, and reactive to light.  Neck: Normal range of motion. Neck supple. No JVD present.  Cardiovascular: Normal rate, regular rhythm and normal heart sounds.  Pulmonary/Chest: Effort normal and breath sounds normal. No accessory muscle usage. No respiratory distress. He  has no decreased breath sounds. He has no wheezes. He has no rhonchi.    Abdominal: Soft. Bowel sounds are normal. There is no tenderness.  Musculoskeletal: He exhibits no edema (neg bilateral LE).  Lymphadenopathy:    He has no cervical adenopathy.  Neurological: He is alert and oriented to person, place, and time. He has intact cranial nerves. No cranial nerve deficit. Gait normal. GCS score is 15.  Skin: Skin is warm, dry and intact. He is not diaphoretic. No erythema.     Psychiatric: Mood, memory, affect and judgment normal.  Nursing note and vitals reviewed.     Lab Results:  CBC    Component Value Date/Time   WBC 5.5 08/27/2017 0501   RBC 3.57 (L) 08/27/2017 0501   HGB 9.7 (L) 08/27/2017 0501   HCT 31.5 (L) 08/27/2017 0501   PLT 154 08/27/2017 0501   MCV 88.2 08/27/2017 0501   MCH 27.2 08/27/2017 0501   MCHC 30.8 08/27/2017 0501   RDW 15.4 08/27/2017 0501   LYMPHSABS 1.8 08/25/2017 1407   MONOABS 0.6 08/25/2017 1407   EOSABS 0.3 08/25/2017  1407   BASOSABS 0.0 08/25/2017 1407    BMET    Component Value Date/Time   NA 134 (L) 08/27/2017 0501   K 2.9 (L) 08/27/2017 0501   CL 98 (L) 08/27/2017 0501   CO2 27 08/27/2017 0501   GLUCOSE 110 (H) 08/27/2017 0501   BUN 10 08/27/2017 0501   CREATININE 6.02 (H) 08/27/2017 0501   CREATININE 7.03 (H) 10/31/2013 1208   CALCIUM 7.4 (L) 08/27/2017 0501   CALCIUM 7.4 (L) 03/01/2014 0944   GFRNONAA 10 (L) 08/27/2017 0501   GFRAA 11 (L) 08/27/2017 0501    BNP    Component Value Date/Time   BNP 461.9 (H) 10/31/2013 1208    ProBNP    Component Value Date/Time   PROBNP 5,926.0 (H) 01/01/2013 0522    Imaging: Dg Chest 2 View  Result Date: 01/16/2018 CLINICAL DATA:  Chronic shortness of breath. EXAM: CHEST - 2 VIEW COMPARISON:  Single-view of the chest 01/04/2018, 12/31/2017 and 05/02/2017. CT chest 12/17/2017. FINDINGS: Left basilar scarring appears unchanged. The lungs are otherwise clear. No pneumothorax. There may be a trace left pleural effusion. Heart size is upper normal. Dialysis catheter is in place. Vascular stent right axilla noted. IMPRESSION: No acute disease. Electronically Signed   By: Inge Rise M.D.   On: 01/16/2018 15:58     Assessment & Plan:   52 year old patient seen office today.  Will get patient established with Dr. Halford Chessman.  Will order home sleep study today.  Patient follow-up with Dr. Halford Chessman for 30-minute appointment at [facility] office at patient's request.  Patient to continue with dialysis schedule as planned.  Patient needs to follow-up with neurology.  Patient and spouse to call their office today to ensure they have an appointment.  Will discharge patient on advance home care tank from our office the patient can have continuous oxygen as the tank they arrived with is empty.  ESRD on dialysis [facility]) Continue Tuesday Thursday Saturday dialysis  Shortness of breath Chest x-ray today  Fatigue Patient needs to follow-up with neurology >>>  Epworth 18 today Chest xray today  Continue oxygen therapy 3 L continuous for right now >>> Will work to wean after cleared by neurology >>> Discharged on advance home care tank as patient's oxygen tank ran out on arrival to our office Continue follow-up with cardiology in [place] with Dr. Halford Chessman for  first available with a new patient appointment (this needs to be 30-minute appointment) in [facility]     Seizure [facility]) Continue oxygen therapy at 3 L continuous at this time Ensure you have follow-up with outpatient neurology If more seizures occur you need to present to the emergency room     Lauraine Rinne, NP 01/16/2018

## 2018-01-16 NOTE — Assessment & Plan Note (Signed)
Continue oxygen therapy at 3 L continuous at this time Ensure you have follow-up with outpatient neurology If more seizures occur you need to present to the emergency room

## 2018-01-16 NOTE — Assessment & Plan Note (Signed)
Continue Tuesday Thursday Saturday dialysis. 

## 2018-01-16 NOTE — Assessment & Plan Note (Signed)
Patient needs to follow-up with neurology >>> Epworth 18 today Chest xray today  Continue oxygen therapy 3 L continuous for right now >>> Will work to wean after cleared by neurology >>> Discharged on advance home care tank as patient's oxygen tank ran out on arrival to our office Continue follow-up with cardiology in [facility] Follow-up with Dr. Halford Chessman for first available with a new patient appointment (this needs to be 30-minute appointment) in [facility]

## 2018-01-17 DIAGNOSIS — A4101 Sepsis due to Methicillin susceptible Staphylococcus aureus: Secondary | ICD-10-CM | POA: Diagnosis not present

## 2018-01-17 DIAGNOSIS — N186 End stage renal disease: Secondary | ICD-10-CM | POA: Diagnosis not present

## 2018-01-17 DIAGNOSIS — N2581 Secondary hyperparathyroidism of renal origin: Secondary | ICD-10-CM | POA: Diagnosis not present

## 2018-01-17 DIAGNOSIS — D509 Iron deficiency anemia, unspecified: Secondary | ICD-10-CM | POA: Diagnosis not present

## 2018-01-17 DIAGNOSIS — D631 Anemia in chronic kidney disease: Secondary | ICD-10-CM | POA: Diagnosis not present

## 2018-01-18 ENCOUNTER — Telehealth: Payer: Self-pay | Admitting: Pulmonary Disease

## 2018-01-18 ENCOUNTER — Encounter: Payer: Self-pay | Admitting: Family Medicine

## 2018-01-18 ENCOUNTER — Ambulatory Visit (INDEPENDENT_AMBULATORY_CARE_PROVIDER_SITE_OTHER): Payer: Medicare Other | Admitting: Family Medicine

## 2018-01-18 VITALS — BP 144/70 | HR 65 | Temp 98.6°F | Resp 18 | Ht 74.0 in | Wt 185.6 lb

## 2018-01-18 DIAGNOSIS — N184 Chronic kidney disease, stage 4 (severe): Secondary | ICD-10-CM

## 2018-01-18 DIAGNOSIS — R0902 Hypoxemia: Secondary | ICD-10-CM | POA: Diagnosis not present

## 2018-01-18 DIAGNOSIS — Z09 Encounter for follow-up examination after completed treatment for conditions other than malignant neoplasm: Secondary | ICD-10-CM

## 2018-01-18 DIAGNOSIS — G40909 Epilepsy, unspecified, not intractable, without status epilepticus: Secondary | ICD-10-CM | POA: Diagnosis not present

## 2018-01-18 DIAGNOSIS — Z794 Long term (current) use of insulin: Secondary | ICD-10-CM

## 2018-01-18 DIAGNOSIS — N186 End stage renal disease: Secondary | ICD-10-CM

## 2018-01-18 DIAGNOSIS — IMO0002 Reserved for concepts with insufficient information to code with codable children: Secondary | ICD-10-CM

## 2018-01-18 DIAGNOSIS — E0865 Diabetes mellitus due to underlying condition with hyperglycemia: Secondary | ICD-10-CM | POA: Diagnosis not present

## 2018-01-18 DIAGNOSIS — I10 Essential (primary) hypertension: Secondary | ICD-10-CM | POA: Diagnosis not present

## 2018-01-18 DIAGNOSIS — E0822 Diabetes mellitus due to underlying condition with diabetic chronic kidney disease: Secondary | ICD-10-CM | POA: Diagnosis not present

## 2018-01-18 DIAGNOSIS — Z1211 Encounter for screening for malignant neoplasm of colon: Secondary | ICD-10-CM

## 2018-01-18 NOTE — Patient Instructions (Signed)
Good to see you today but I am certainly sorry about all your recent health problems!  I am going to refer you to cardiology and also will order Cologuard testing to screen for colon cancer. I hope that this will be sufficient and help you avoid a colonoscopy Please let me know if I can help in any way Otherwise let's plan to visit in about 6 months

## 2018-01-18 NOTE — Telephone Encounter (Signed)
Notes recorded by Lauraine Rinne, NP on 01/16/2018 at 5:04 PM EDT Please let the patient know that his chest x-ray has come back showing no acute changes.  Ensure the patient has follow-up with neurology as this is a very important part of his care.  Wyn Quaker, FNP  Spoke with the pt's spouse and notified of results/recs per Aaron Edelman  She verbalized understanding and no questions/concerns

## 2018-01-19 DIAGNOSIS — N2581 Secondary hyperparathyroidism of renal origin: Secondary | ICD-10-CM | POA: Diagnosis not present

## 2018-01-19 DIAGNOSIS — E1122 Type 2 diabetes mellitus with diabetic chronic kidney disease: Secondary | ICD-10-CM | POA: Diagnosis not present

## 2018-01-19 DIAGNOSIS — D631 Anemia in chronic kidney disease: Secondary | ICD-10-CM | POA: Diagnosis not present

## 2018-01-19 DIAGNOSIS — A4101 Sepsis due to Methicillin susceptible Staphylococcus aureus: Secondary | ICD-10-CM | POA: Diagnosis not present

## 2018-01-19 DIAGNOSIS — D509 Iron deficiency anemia, unspecified: Secondary | ICD-10-CM | POA: Diagnosis not present

## 2018-01-19 DIAGNOSIS — N186 End stage renal disease: Secondary | ICD-10-CM | POA: Diagnosis not present

## 2018-01-20 ENCOUNTER — Telehealth: Payer: Self-pay | Admitting: *Deleted

## 2018-01-20 DIAGNOSIS — R569 Unspecified convulsions: Secondary | ICD-10-CM | POA: Diagnosis not present

## 2018-01-20 DIAGNOSIS — I5030 Unspecified diastolic (congestive) heart failure: Secondary | ICD-10-CM | POA: Diagnosis not present

## 2018-01-20 DIAGNOSIS — I132 Hypertensive heart and chronic kidney disease with heart failure and with stage 5 chronic kidney disease, or end stage renal disease: Secondary | ICD-10-CM | POA: Diagnosis not present

## 2018-01-20 DIAGNOSIS — E1122 Type 2 diabetes mellitus with diabetic chronic kidney disease: Secondary | ICD-10-CM | POA: Diagnosis not present

## 2018-01-20 DIAGNOSIS — T827XXA Infection and inflammatory reaction due to other cardiac and vascular devices, implants and grafts, initial encounter: Secondary | ICD-10-CM | POA: Diagnosis not present

## 2018-01-20 DIAGNOSIS — N186 End stage renal disease: Secondary | ICD-10-CM | POA: Diagnosis not present

## 2018-01-20 NOTE — Telephone Encounter (Signed)
Received Cologuard Incomplete [phone]; order has been faxed back d/t missing information necessary to send patient a Cologuard kit. [employer] has made several attempts to reach patient by phone and has not been able to reach them; forwarded to provider/SLS

## 2018-01-21 DIAGNOSIS — A4101 Sepsis due to Methicillin susceptible Staphylococcus aureus: Secondary | ICD-10-CM | POA: Diagnosis not present

## 2018-01-21 DIAGNOSIS — D509 Iron deficiency anemia, unspecified: Secondary | ICD-10-CM | POA: Diagnosis not present

## 2018-01-21 DIAGNOSIS — N186 End stage renal disease: Secondary | ICD-10-CM | POA: Diagnosis not present

## 2018-01-21 DIAGNOSIS — D631 Anemia in chronic kidney disease: Secondary | ICD-10-CM | POA: Diagnosis not present

## 2018-01-21 DIAGNOSIS — N2581 Secondary hyperparathyroidism of renal origin: Secondary | ICD-10-CM | POA: Diagnosis not present

## 2018-01-22 DIAGNOSIS — G40909 Epilepsy, unspecified, not intractable, without status epilepticus: Secondary | ICD-10-CM | POA: Diagnosis present

## 2018-01-22 DIAGNOSIS — R531 Weakness: Secondary | ICD-10-CM | POA: Diagnosis not present

## 2018-01-22 DIAGNOSIS — Z833 Family history of diabetes mellitus: Secondary | ICD-10-CM | POA: Diagnosis not present

## 2018-01-22 DIAGNOSIS — K219 Gastro-esophageal reflux disease without esophagitis: Secondary | ICD-10-CM | POA: Diagnosis present

## 2018-01-22 DIAGNOSIS — I272 Pulmonary hypertension, unspecified: Secondary | ICD-10-CM | POA: Diagnosis not present

## 2018-01-22 DIAGNOSIS — Z8614 Personal history of Methicillin resistant Staphylococcus aureus infection: Secondary | ICD-10-CM | POA: Diagnosis not present

## 2018-01-22 DIAGNOSIS — I10 Essential (primary) hypertension: Secondary | ICD-10-CM | POA: Diagnosis not present

## 2018-01-22 DIAGNOSIS — I120 Hypertensive chronic kidney disease with stage 5 chronic kidney disease or end stage renal disease: Secondary | ICD-10-CM | POA: Diagnosis not present

## 2018-01-22 DIAGNOSIS — N185 Chronic kidney disease, stage 5: Secondary | ICD-10-CM | POA: Diagnosis not present

## 2018-01-22 DIAGNOSIS — D631 Anemia in chronic kidney disease: Secondary | ICD-10-CM | POA: Diagnosis present

## 2018-01-22 DIAGNOSIS — I509 Heart failure, unspecified: Secondary | ICD-10-CM | POA: Diagnosis not present

## 2018-01-22 DIAGNOSIS — Z79899 Other long term (current) drug therapy: Secondary | ICD-10-CM | POA: Diagnosis not present

## 2018-01-22 DIAGNOSIS — Z9981 Dependence on supplemental oxygen: Secondary | ICD-10-CM | POA: Diagnosis not present

## 2018-01-22 DIAGNOSIS — E1122 Type 2 diabetes mellitus with diabetic chronic kidney disease: Secondary | ICD-10-CM | POA: Diagnosis present

## 2018-01-22 DIAGNOSIS — R0602 Shortness of breath: Secondary | ICD-10-CM | POA: Diagnosis not present

## 2018-01-22 DIAGNOSIS — J9690 Respiratory failure, unspecified, unspecified whether with hypoxia or hypercapnia: Secondary | ICD-10-CM | POA: Diagnosis not present

## 2018-01-22 DIAGNOSIS — R296 Repeated falls: Secondary | ICD-10-CM | POA: Diagnosis present

## 2018-01-22 DIAGNOSIS — I5032 Chronic diastolic (congestive) heart failure: Secondary | ICD-10-CM | POA: Diagnosis present

## 2018-01-22 DIAGNOSIS — N186 End stage renal disease: Secondary | ICD-10-CM | POA: Diagnosis present

## 2018-01-22 DIAGNOSIS — R55 Syncope and collapse: Secondary | ICD-10-CM | POA: Diagnosis not present

## 2018-01-22 DIAGNOSIS — D649 Anemia, unspecified: Secondary | ICD-10-CM | POA: Diagnosis not present

## 2018-01-22 DIAGNOSIS — L409 Psoriasis, unspecified: Secondary | ICD-10-CM | POA: Diagnosis present

## 2018-01-22 DIAGNOSIS — Z992 Dependence on renal dialysis: Secondary | ICD-10-CM | POA: Diagnosis not present

## 2018-01-22 DIAGNOSIS — Z8249 Family history of ischemic heart disease and other diseases of the circulatory system: Secondary | ICD-10-CM | POA: Diagnosis not present

## 2018-01-22 DIAGNOSIS — R42 Dizziness and giddiness: Secondary | ICD-10-CM | POA: Diagnosis not present

## 2018-01-22 DIAGNOSIS — I132 Hypertensive heart and chronic kidney disease with heart failure and with stage 5 chronic kidney disease, or end stage renal disease: Secondary | ICD-10-CM | POA: Diagnosis not present

## 2018-01-22 DIAGNOSIS — E1169 Type 2 diabetes mellitus with other specified complication: Secondary | ICD-10-CM | POA: Diagnosis not present

## 2018-01-22 DIAGNOSIS — Z794 Long term (current) use of insulin: Secondary | ICD-10-CM | POA: Diagnosis not present

## 2018-01-22 DIAGNOSIS — I129 Hypertensive chronic kidney disease with stage 1 through stage 4 chronic kidney disease, or unspecified chronic kidney disease: Secondary | ICD-10-CM | POA: Diagnosis not present

## 2018-01-23 MED ORDER — LEVETIRACETAM 500 MG PO TABS
500.00 | ORAL_TABLET | ORAL | Status: DC
Start: 2018-01-24 — End: 2018-01-23

## 2018-01-23 MED ORDER — ACETAMINOPHEN 325 MG PO TABS
650.00 | ORAL_TABLET | ORAL | Status: DC
Start: ? — End: 2018-01-23

## 2018-01-23 MED ORDER — ALUMINUM-MAGNESIUM-SIMETHICONE 200-200-20 MG/5ML PO SUSP
30.00 | ORAL | Status: DC
Start: ? — End: 2018-01-23

## 2018-01-23 MED ORDER — LORAZEPAM 2 MG/ML IJ SOLN
2.00 | INTRAMUSCULAR | Status: DC
Start: ? — End: 2018-01-23

## 2018-01-23 MED ORDER — DEXTROSE 50 % IV SOLN
12.00 g | INTRAVENOUS | Status: DC
Start: ? — End: 2018-01-23

## 2018-01-23 MED ORDER — CLONIDINE HCL 0.1 MG PO TABS
.10 | ORAL_TABLET | ORAL | Status: DC
Start: ? — End: 2018-01-23

## 2018-01-23 MED ORDER — AMLODIPINE BESYLATE 5 MG PO TABS
10.00 | ORAL_TABLET | ORAL | Status: DC
Start: 2018-01-23 — End: 2018-01-23

## 2018-01-23 MED ORDER — ONDANSETRON HCL 4 MG/2ML IJ SOLN
4.00 | INTRAMUSCULAR | Status: DC
Start: ? — End: 2018-01-23

## 2018-01-23 MED ORDER — CALCIUM ACETATE (PHOS BINDER) 667 MG PO CAPS
1334.00 | ORAL_CAPSULE | ORAL | Status: DC
Start: 2018-01-23 — End: 2018-01-23

## 2018-01-23 MED ORDER — HEPARIN SODIUM (PORCINE) 5000 UNIT/ML IJ SOLN
2.00 | INTRAMUSCULAR | Status: DC
Start: ? — End: 2018-01-23

## 2018-01-23 MED ORDER — GLUCOSE 40 % PO GEL
15.00 g | ORAL | Status: DC
Start: ? — End: 2018-01-23

## 2018-01-23 MED ORDER — CARVEDILOL 3.125 MG PO TABS
6.25 | ORAL_TABLET | ORAL | Status: DC
Start: 2018-01-23 — End: 2018-01-23

## 2018-01-23 MED ORDER — BISACODYL 5 MG PO TBEC
10.00 | DELAYED_RELEASE_TABLET | ORAL | Status: DC
Start: ? — End: 2018-01-23

## 2018-01-23 MED ORDER — HYDRALAZINE HCL 20 MG/ML IJ SOLN
10.00 | INTRAMUSCULAR | Status: DC
Start: ? — End: 2018-01-23

## 2018-01-23 MED ORDER — ONDANSETRON 4 MG PO TBDP
4.00 | ORAL_TABLET | ORAL | Status: DC
Start: ? — End: 2018-01-23

## 2018-01-23 MED ORDER — ZOLPIDEM TARTRATE 5 MG PO TABS
5.00 | ORAL_TABLET | ORAL | Status: DC
Start: ? — End: 2018-01-23

## 2018-01-23 MED ORDER — SORBITOL 70 % RE SOLN
30.00 | RECTAL | Status: DC
Start: ? — End: 2018-01-23

## 2018-01-23 MED ORDER — DIPHENHYDRAMINE HCL 25 MG PO CAPS
25.00 | ORAL_CAPSULE | ORAL | Status: DC
Start: ? — End: 2018-01-23

## 2018-01-23 MED ORDER — HYDROCODONE-ACETAMINOPHEN 5-325 MG PO TABS
1.00 | ORAL_TABLET | ORAL | Status: DC
Start: ? — End: 2018-01-23

## 2018-01-23 MED ORDER — GENERIC EXTERNAL MEDICATION
8.00 | Status: DC
Start: 2018-01-23 — End: 2018-01-23

## 2018-01-23 MED ORDER — LACOSAMIDE 50 MG PO TABS
75.00 | ORAL_TABLET | ORAL | Status: DC
Start: 2018-01-23 — End: 2018-01-23

## 2018-01-23 MED ORDER — APREMILAST 30 MG PO TABS
30.00 | ORAL_TABLET | ORAL | Status: DC
Start: 2018-01-23 — End: 2018-01-23

## 2018-01-23 MED ORDER — GENERIC EXTERNAL MEDICATION
1.00 | Status: DC
Start: ? — End: 2018-01-23

## 2018-01-23 MED ORDER — LEVETIRACETAM 500 MG PO TABS
500.00 | ORAL_TABLET | ORAL | Status: DC
Start: ? — End: 2018-01-23

## 2018-01-23 MED ORDER — INSULIN LISPRO 100 UNIT/ML ~~LOC~~ SOLN
1.00 | SUBCUTANEOUS | Status: DC
Start: 2018-01-23 — End: 2018-01-23

## 2018-01-24 ENCOUNTER — Encounter: Payer: Self-pay | Admitting: Cardiology

## 2018-01-24 ENCOUNTER — Ambulatory Visit (INDEPENDENT_AMBULATORY_CARE_PROVIDER_SITE_OTHER): Payer: Medicare Other | Admitting: Cardiology

## 2018-01-24 VITALS — BP 130/78 | HR 58 | Ht 74.0 in | Wt 184.1 lb

## 2018-01-24 DIAGNOSIS — N184 Chronic kidney disease, stage 4 (severe): Secondary | ICD-10-CM

## 2018-01-24 DIAGNOSIS — I5032 Chronic diastolic (congestive) heart failure: Secondary | ICD-10-CM

## 2018-01-24 DIAGNOSIS — E0865 Diabetes mellitus due to underlying condition with hyperglycemia: Secondary | ICD-10-CM

## 2018-01-24 DIAGNOSIS — Z992 Dependence on renal dialysis: Secondary | ICD-10-CM

## 2018-01-24 DIAGNOSIS — D509 Iron deficiency anemia, unspecified: Secondary | ICD-10-CM | POA: Diagnosis not present

## 2018-01-24 DIAGNOSIS — I10 Essential (primary) hypertension: Secondary | ICD-10-CM

## 2018-01-24 DIAGNOSIS — A4101 Sepsis due to Methicillin susceptible Staphylococcus aureus: Secondary | ICD-10-CM | POA: Diagnosis not present

## 2018-01-24 DIAGNOSIS — IMO0002 Reserved for concepts with insufficient information to code with codable children: Secondary | ICD-10-CM

## 2018-01-24 DIAGNOSIS — D631 Anemia in chronic kidney disease: Secondary | ICD-10-CM | POA: Diagnosis not present

## 2018-01-24 DIAGNOSIS — E0822 Diabetes mellitus due to underlying condition with diabetic chronic kidney disease: Secondary | ICD-10-CM

## 2018-01-24 DIAGNOSIS — R569 Unspecified convulsions: Secondary | ICD-10-CM

## 2018-01-24 DIAGNOSIS — R0602 Shortness of breath: Secondary | ICD-10-CM | POA: Diagnosis not present

## 2018-01-24 DIAGNOSIS — Z794 Long term (current) use of insulin: Secondary | ICD-10-CM

## 2018-01-24 DIAGNOSIS — N2581 Secondary hyperparathyroidism of renal origin: Secondary | ICD-10-CM | POA: Diagnosis not present

## 2018-01-24 DIAGNOSIS — N186 End stage renal disease: Secondary | ICD-10-CM | POA: Diagnosis not present

## 2018-01-24 DIAGNOSIS — R001 Bradycardia, unspecified: Secondary | ICD-10-CM | POA: Diagnosis not present

## 2018-01-24 MED ORDER — ALUMINUM-MAGNESIUM-SIMETHICONE 200-200-20 MG/5ML PO SUSP
30.00 | ORAL | Status: DC
Start: ? — End: 2018-01-24

## 2018-01-24 MED ORDER — GLUCOSE 40 % PO GEL
15.00 g | ORAL | Status: DC
Start: ? — End: 2018-01-24

## 2018-01-24 MED ORDER — AMLODIPINE BESYLATE 5 MG PO TABS
10.00 | ORAL_TABLET | ORAL | Status: DC
Start: 2018-01-23 — End: 2018-01-24

## 2018-01-24 MED ORDER — HYDROCODONE-ACETAMINOPHEN 5-325 MG PO TABS
1.00 | ORAL_TABLET | ORAL | Status: DC
Start: ? — End: 2018-01-24

## 2018-01-24 MED ORDER — APREMILAST 30 MG PO TABS
30.00 | ORAL_TABLET | ORAL | Status: DC
Start: 2018-01-23 — End: 2018-01-24

## 2018-01-24 MED ORDER — CLONIDINE HCL 0.1 MG PO TABS
0.10 | ORAL_TABLET | ORAL | Status: DC
Start: ? — End: 2018-01-24

## 2018-01-24 MED ORDER — ONDANSETRON 4 MG PO TBDP
4.00 | ORAL_TABLET | ORAL | Status: DC
Start: ? — End: 2018-01-24

## 2018-01-24 MED ORDER — DEXTROSE 50 % IV SOLN
12.00 g | INTRAVENOUS | Status: DC
Start: ? — End: 2018-01-24

## 2018-01-24 MED ORDER — LEVETIRACETAM 500 MG PO TABS
500.00 | ORAL_TABLET | ORAL | Status: DC
Start: ? — End: 2018-01-24

## 2018-01-24 MED ORDER — LACOSAMIDE 50 MG PO TABS
75.00 | ORAL_TABLET | ORAL | Status: DC
Start: 2018-01-23 — End: 2018-01-24

## 2018-01-24 MED ORDER — BISACODYL 5 MG PO TBEC
10.00 | DELAYED_RELEASE_TABLET | ORAL | Status: DC
Start: ? — End: 2018-01-24

## 2018-01-24 MED ORDER — ONDANSETRON HCL 4 MG/2ML IJ SOLN
4.00 | INTRAMUSCULAR | Status: DC
Start: ? — End: 2018-01-24

## 2018-01-24 MED ORDER — DIPHENHYDRAMINE HCL 25 MG PO CAPS
25.00 | ORAL_CAPSULE | ORAL | Status: DC
Start: ? — End: 2018-01-24

## 2018-01-24 MED ORDER — ZOLPIDEM TARTRATE 5 MG PO TABS
5.00 | ORAL_TABLET | ORAL | Status: DC
Start: ? — End: 2018-01-24

## 2018-01-24 MED ORDER — LORAZEPAM 2 MG/ML IJ SOLN
2.00 | INTRAMUSCULAR | Status: DC
Start: ? — End: 2018-01-24

## 2018-01-24 MED ORDER — LEVETIRACETAM 500 MG PO TABS
500.00 | ORAL_TABLET | ORAL | Status: DC
Start: 2018-01-24 — End: 2018-01-24

## 2018-01-24 MED ORDER — GENERIC EXTERNAL MEDICATION
1.00 | Status: DC
Start: ? — End: 2018-01-24

## 2018-01-24 MED ORDER — CALCIUM ACETATE (PHOS BINDER) 667 MG PO CAPS
1334.00 | ORAL_CAPSULE | ORAL | Status: DC
Start: 2018-01-23 — End: 2018-01-24

## 2018-01-24 MED ORDER — GENERIC EXTERNAL MEDICATION
8.00 | Status: DC
Start: 2018-01-23 — End: 2018-01-24

## 2018-01-24 MED ORDER — HYDRALAZINE HCL 20 MG/ML IJ SOLN
10.00 | INTRAMUSCULAR | Status: DC
Start: ? — End: 2018-01-24

## 2018-01-24 MED ORDER — ACETAMINOPHEN 325 MG PO TABS
650.00 | ORAL_TABLET | ORAL | Status: DC
Start: ? — End: 2018-01-24

## 2018-01-24 MED ORDER — SORBITOL 70 % RE SOLN
30.00 | RECTAL | Status: DC
Start: ? — End: 2018-01-24

## 2018-01-24 MED ORDER — INSULIN LISPRO 100 UNIT/ML ~~LOC~~ SOLN
1.00 | SUBCUTANEOUS | Status: DC
Start: 2018-01-23 — End: 2018-01-24

## 2018-01-24 MED ORDER — CARVEDILOL 3.125 MG PO TABS
6.25 | ORAL_TABLET | ORAL | Status: DC
Start: 2018-01-23 — End: 2018-01-24

## 2018-01-24 NOTE — Progress Notes (Signed)
Cardiology Consultation:    Date:  01/24/2018   ID:  Bryan Wilkerson, DOB 20-Mar-1966, MRN [id number]  PCP:  [name], MD  Cardiologist:  Jenne Campus, MD   Referring MD: [name], MD   Chief Complaint  Patient presents with  . Hospitalization Follow-up  . Hypertension  . Hypoxemia  I need a cardiology appointment  History of Present Illness:    Bryan Wilkerson is a 52 y.o. male who is being seen today for the evaluation of shortness of breath at the request of Copland, Gay Filler, MD.  He does have multiple medical problems that include long-standing hypertension, diabetes, chronic renal failure he is on dialysis.  Also does have a recent diagnosis of seizures.  I have noted also in the chart is some mentioning about pulmonary hypertension.  He is here to be reestablished as a patient and he is not sure exactly why he is here.  Described to have some fatigue tiredness and shortness of breath while walking.  There is no swelling of lower extremities.  Recently he required blood transfusion for his anemia which is related to his chronic kidney problem.  He also initiated conversation about potentially having kidney transplant.  He is wearing oxygen he said when he does not have oxygen he will have seizures.  Recently he had multiple episode of seizures last one yesterday.  Apparently is better and has been followed by neurology it continues adjustment of his medication he has been done.  Never had any heart trouble no chest pain tightness squeezing pressure burning chest no palpitations.  Past Medical History:  Diagnosis Date  . Anemia   . CHF (congestive heart failure) ([place])   . Diabetic retinopathy ([place])   . ESRD on peritoneal dialysis ([place])    "7 days/week" (04/20/2017)  . Hypertension   . Pneumonia 2016; 04/19/2017  . Psoriasis   . Type I diabetes mellitus ([place])     Past Surgical History:  Procedure Laterality Date  . AV FISTULA PLACEMENT Right  12/05/2013   Procedure: RADIOCEPHALIC VS. BRACHIOCEPHALIC ARTERIOVENOUS (AV) FISTULA CREATION;  Surgeon: Conrad Honesdale, MD;  Location: [facility];  Service: Vascular;  Laterality: Right;  . AV FISTULA PLACEMENT Left 07/20/2016   Procedure: LEFT ARM RADIOCEPHALIC ARTERIOVENOUS (AV) FISTULA CREATION;  Surgeon: Waynetta Sandy, MD;  Location: [facility];  Service: Vascular;  Laterality: Left;  . CATARACT EXTRACTION W/ INTRAOCULAR LENS  IMPLANT, BILATERAL Bilateral   . EYE SURGERY    . FISTULOGRAM Right 07/16/2014   Procedure: FISTULOGRAM;  Surgeon: Conrad Las Vegas, MD;  Location: [facility];  Service: Vascular;  Laterality: Right;  . IR THORACENTESIS ASP PLEURAL SPACE W/IMG GUIDE  05/02/2017  . LIGATION OF COMPETING BRANCHES OF ARTERIOVENOUS FISTULA Right 07/16/2014   Procedure: LIGATION OF COMPETING BRANCHES OF ARTERIOVENOUS FISTULA;  Surgeon: Conrad Sawgrass, MD;  Location: [facility];  Service: Vascular;  Laterality: Right;  . PERITONEAL CATHETER INSERTION Left ~ 08/2016  . PORT-A-CATH REMOVAL  2017  . PORTA CATH INSERTION Right    "for hemodialysis"  . RETINAL DETACHMENT SURGERY Right     Current Medications: Current Meds  Medication Sig  . amLODipine (NORVASC) 5 MG tablet Take 5 mg by mouth daily.   . calcitRIOL (ROCALTROL) 0.25 MCG capsule Take 2 capsules (0.5 mcg total) by mouth daily. (Patient taking differently: Take 0.25 mcg by mouth 3 (three) times daily. )  . carvedilol (COREG) 6.25 MG tablet Take 1 tablet (6.25 mg total)  by mouth 2 (two) times daily with a meal.  . LEVEMIR FLEXTOUCH 100 UNIT/ML Pen Inject 2-8 Units into the skin daily at 10 pm. Sliding Scale  . NOVOLOG FLEXPEN 100 UNIT/ML FlexPen Sliding scale over  150-315 1 units  . OTEZLA 30 MG TABS   . sucroferric oxyhydroxide (VELPHORO) 500 MG chewable tablet Chew 500 mg by mouth 3 (three) times daily with meals.  . triamcinolone ointment (KENALOG) 0.1 % Apply 1 application topically as needed (psorasis).      Allergies:   Penicillins    Social History   Socioeconomic History  . Marital status: Married    Spouse name: Not on file  . Number of children: Not on file  . Years of education: Not on file  . Highest education level: Not on file  Occupational History  . Occupation: logistics  Social Needs  . Financial resource strain: Not on file  . Food insecurity:    Worry: Not on file    Inability: Not on file  . Transportation needs:    Medical: Not on file    Non-medical: Not on file  Tobacco Use  . Smoking status: Never Smoker  . Smokeless tobacco: Never Used  Substance and Sexual Activity  . Alcohol use: No    Alcohol/week: 0.0 oz  . Drug use: No  . Sexual activity: Yes  Lifestyle  . Physical activity:    Days per week: Not on file    Minutes per session: Not on file  . Stress: Not on file  Relationships  . Social connections:    Talks on phone: Not on file    Gets together: Not on file    Attends religious service: Not on file    Active member of club or organization: Not on file    Attends meetings of clubs or organizations: Not on file    Relationship status: Not on file  Other Topics Concern  . Not on file  Social History Narrative   [facility] (04/22/17):   Lives with wife. Does have an indoor dog. No bird or mold exposure. No recent travel. He works as a Counsellor.     Family History: The patient's family history includes Heart attack in his mother; Hypertension in his mother. There is no history of Chronic Renal Failure, Diabetes, Stroke, or Cancer. ROS:   Please see the history of present illness.    All 14 point review of systems negative except as described per history of present illness.  EKGs/Labs/Other Studies Reviewed:    The following studies were reviewed today: He did have some cardiac test done at [facility] hospital however I have no access to it I know that he gets an cardiology scan done but I do not know specifically what was done he tells me that he did  not have any recent echocardiogram I did review echocardiogram from few years ago which show LVH normal left ventricular ejection fraction atrial enlargement.  EKG:  EKG is  ordered today.  The ekg ordered today demonstrates normal sinus bradycardia rate of 57 first-degree AV block normal QS complex duration morphology no ST-T segment changes  Recent Labs: 08/03/2017: Magnesium 3.4 08/27/2017: ALT 21; BUN 10; Creatinine, Ser 6.02; Hemoglobin 9.7; Platelets 154; Potassium 2.9; Sodium 134; TSH 3.765  Recent Lipid Panel    Component Value Date/Time   CHOL 150 12/30/2012 0400   TRIG 95 12/30/2012 0400   HDL 53 12/30/2012 0400   CHOLHDL 2.8 12/30/2012 0400  VLDL 19 12/30/2012 0400   LDLCALC 78 12/30/2012 0400    Physical Exam:    VS:  BP 130/78   Pulse (!) 58   Ht 6\' 2"  (1.88 m)   Wt 184 lb 1.9 oz (83.5 kg)   SpO2 98% Comment: 3 lt of O2  BMI 23.64 kg/m     Wt Readings from Last 3 Encounters:  01/24/18 184 lb 1.9 oz (83.5 kg)  01/18/18 185 lb 9.6 oz (84.2 kg)  01/16/18 188 lb (85.3 kg)     GEN:  Well nourished, well developed in no acute distress HEENT: Normal NECK: No JVD; No carotid bruits LYMPHATICS: No lymphadenopathy CARDIAC: RRR, no murmurs, no rubs, no gallops RESPIRATORY:  Clear to auscultation without rales, wheezing or rhonchi  ABDOMEN: Soft, non-tender, non-distended MUSCULOSKELETAL:  No edema; No deformity  SKIN: Warm and dry NEUROLOGIC:  Alert and oriented x 3 PSYCHIATRIC:  Normal affect   ASSESSMENT:    1. Chronic diastolic congestive heart failure, NYHA class 2 ([place])   2. Essential hypertension, benign   3. Diabetes mellitus due to underlying condition, uncontrolled, with stage 4 chronic kidney disease, with long-term current use of insulin ([place])   4. ESRD on dialysis ([place])   5. Shortness of breath   6. Seizure ([place])    PLAN:    In order of problems listed above:  1. Chronic diastolic congestive heart failure appears to be compensated right now.  I  will try to cardiology scan from [facility] to see exactly what was done if he did not have any recent echocardiogram tensioning to have an echocardiogram done that will tell us about systolic diastolic function as well as will try to assess his pulmonary artery pressure. 2. Essential hypertension blood pressure well controlled today.  We will continue present management. 3. Diabetes mellitus: Followed by internal medicine team apparently stable 4. End-stage renal disease she is to be on peritoneal dialysis however because of peritonitis he is now on hemodialysis.  Recently he got his shot balloon and he does have double-lumen catheter right now but my understanding is that once are to switch this to regular access to AV fistula.   5. Shortness of breath: Again could be related to pulmonary hypertension or diastolic dysfunction.  Will get echocardiogram to clarify that 6. Seizures: Followed by neurology.  Doing better from that point review but still some room for improvement I will ask him to wear Holter monitor for 48 hours or make sure he does not have critical bradycardia that could cause seizures.   Medication Adjustments/Labs and Tests Ordered: Current medicines are reviewed at length with the patient today.  Concerns regarding medicines are outlined above.  No orders of the defined types were placed in this encounter.  No orders of the defined types were placed in this encounter.   Signed, Park Liter, MD, [facility]. 01/24/2018 10:47 AM    [place]

## 2018-01-24 NOTE — Patient Instructions (Addendum)
Medication Instructions:  Your physician recommends that you continue on your current medications as directed. Please refer to the Current Medication list given to you today.   Labwork: None.  Testing/Procedures:  You had an EKG today.   Your physician has recommended that you wear a holter monitor. Holter monitors are medical devices that record the heart's electrical activity. Doctors most often use these monitors to diagnose arrhythmias. Arrhythmias are problems with the speed or rhythm of the heartbeat. The monitor is a small, portable device. You can wear one while you do your normal daily activities. This is usually used to diagnose what is causing palpitations/syncope (passing out). Wear  For 48 hours.   Your physician has requested that you have an echocardiogram. Echocardiography is a painless test that uses sound waves to create images of your heart. It provides your doctor with information about the size and shape of your heart and how well your heart's chambers and valves are working. This procedure takes approximately one hour. There are no restrictions for this procedure.     Follow-Up: Your physician recommends that you schedule a follow-up appointment in: 1 month.   Any Other Special Instructions Will Be Listed Below (If Applicable).     If you need a refill on your cardiac medications before your next appointment, please call your pharmacy.  Echocardiogram An echocardiogram, or echocardiography, uses sound waves (ultrasound) to produce an image of your heart. The echocardiogram is simple, painless, obtained within a short period of time, and offers valuable information to your health care provider. The images from an echocardiogram can provide information such as:  Evidence of coronary artery disease (CAD).  Heart size.  Heart muscle function.  Heart valve function.  Aneurysm detection.  Evidence of a past heart attack.  Fluid buildup around the  heart.  Heart muscle thickening.  Assess heart valve function.  Tell a health care provider about:  Any allergies you have.  All medicines you are taking, including vitamins, herbs, eye drops, creams, and over-the-counter medicines.  Any problems you or family members have had with anesthetic medicines.  Any blood disorders you have.  Any surgeries you have had.  Any medical conditions you have.  Whether you are pregnant or may be pregnant. What happens before the procedure? No special preparation is needed. Eat and drink normally. What happens during the procedure?  In order to produce an image of your heart, gel will be applied to your chest and a wand-like tool (transducer) will be moved over your chest. The gel will help transmit the sound waves from the transducer. The sound waves will harmlessly bounce off your heart to allow the heart images to be captured in real-time motion. These images will then be recorded.  You may need an IV to receive a medicine that improves the quality of the pictures. What happens after the procedure? You may return to your normal schedule including diet, activities, and medicines, unless your health care provider tells you otherwise. This information is not intended to replace advice given to you by your health care provider. Make sure you discuss any questions you have with your health care provider. Document Released: 06/18/2000 Document Revised: 02/07/2016 Document Reviewed: 02/26/2013 Elsevier Interactive Patient Education  2017 [place].   Holter Monitoring A Holter monitor is a small device that is used to detect abnormal heart rhythms. It clips to your clothing and is connected by wires to flat, sticky disks (electrodes) that attach to your chest. It is  worn continuously for 24-48 hours. Follow these instructions at home:  Wear your Holter monitor at all times, even while exercising and sleeping, for as long as directed by your  health care provider.  Make sure that the Holter monitor is safely clipped to your clothing or close to your body as recommended by your health care provider.  Do not get the monitor or wires wet.  Do not put body lotion or moisturizer on your chest.  Keep your skin clean.  Keep a diary of your daily activities, such as walking and doing chores. If you feel that your heartbeat is abnormal or that your heart is fluttering or skipping a beat: ? Record what you are doing when it happens. ? Record what time of day the symptoms occur.  Return your Holter monitor as directed by your health care provider.  Keep all follow-up visits as directed by your health care provider. This is important. Get help right away if:  You feel lightheaded or you faint.  You have trouble breathing.  You feel pain in your chest, upper arm, or jaw.  You feel sick to your stomach and your skin is pale, cool, or damp.  You heartbeat feels unusual or abnormal. This information is not intended to replace advice given to you by your health care provider. Make sure you discuss any questions you have with your health care provider. Document Released: 03/19/2004 Document Revised: 11/27/2015 Document Reviewed: 01/28/2014 Elsevier Interactive Patient Education  [employer].

## 2018-01-25 DIAGNOSIS — R569 Unspecified convulsions: Secondary | ICD-10-CM | POA: Diagnosis not present

## 2018-01-25 DIAGNOSIS — Z79899 Other long term (current) drug therapy: Secondary | ICD-10-CM | POA: Diagnosis not present

## 2018-01-26 DIAGNOSIS — A4101 Sepsis due to Methicillin susceptible Staphylococcus aureus: Secondary | ICD-10-CM | POA: Diagnosis not present

## 2018-01-26 DIAGNOSIS — D631 Anemia in chronic kidney disease: Secondary | ICD-10-CM | POA: Diagnosis not present

## 2018-01-26 DIAGNOSIS — D509 Iron deficiency anemia, unspecified: Secondary | ICD-10-CM | POA: Diagnosis not present

## 2018-01-26 DIAGNOSIS — N186 End stage renal disease: Secondary | ICD-10-CM | POA: Diagnosis not present

## 2018-01-26 DIAGNOSIS — N2581 Secondary hyperparathyroidism of renal origin: Secondary | ICD-10-CM | POA: Diagnosis not present

## 2018-01-27 ENCOUNTER — Ambulatory Visit (HOSPITAL_BASED_OUTPATIENT_CLINIC_OR_DEPARTMENT_OTHER)
Admission: RE | Admit: 2018-01-27 | Discharge: 2018-01-27 | Disposition: A | Discharge status: Home / Self Care | Payer: Medicare Other | Source: Ambulatory Visit | Attending: Cardiology | Admitting: Cardiology

## 2018-01-27 DIAGNOSIS — R001 Bradycardia, unspecified: Secondary | ICD-10-CM | POA: Diagnosis not present

## 2018-01-27 DIAGNOSIS — I5032 Chronic diastolic (congestive) heart failure: Secondary | ICD-10-CM | POA: Insufficient documentation

## 2018-01-27 DIAGNOSIS — E119 Type 2 diabetes mellitus without complications: Secondary | ICD-10-CM | POA: Diagnosis not present

## 2018-01-27 DIAGNOSIS — R0602 Shortness of breath: Secondary | ICD-10-CM | POA: Diagnosis not present

## 2018-01-27 DIAGNOSIS — I272 Pulmonary hypertension, unspecified: Secondary | ICD-10-CM | POA: Insufficient documentation

## 2018-01-27 DIAGNOSIS — I110 Hypertensive heart disease with heart failure: Secondary | ICD-10-CM | POA: Diagnosis not present

## 2018-01-27 NOTE — Progress Notes (Signed)
  Echocardiogram 2D Echocardiogram has been performed.  Lael Pilch T Shelden Raborn 01/27/2018, 10:17 AM

## 2018-01-30 ENCOUNTER — Encounter (HOSPITAL_COMMUNITY): Admission: RE | Payer: Self-pay | Source: Ambulatory Visit

## 2018-01-30 ENCOUNTER — Telehealth: Payer: Self-pay | Admitting: Family Medicine

## 2018-01-30 ENCOUNTER — Ambulatory Visit (HOSPITAL_COMMUNITY): Admission: RE | Admit: 2018-01-30 | Payer: 59 | Source: Ambulatory Visit | Admitting: Vascular Surgery

## 2018-01-30 ENCOUNTER — Ambulatory Visit: Payer: Medicare Other

## 2018-01-30 DIAGNOSIS — I5030 Unspecified diastolic (congestive) heart failure: Secondary | ICD-10-CM | POA: Diagnosis not present

## 2018-01-30 DIAGNOSIS — R001 Bradycardia, unspecified: Secondary | ICD-10-CM | POA: Diagnosis not present

## 2018-01-30 DIAGNOSIS — T827XXA Infection and inflammatory reaction due to other cardiac and vascular devices, implants and grafts, initial encounter: Secondary | ICD-10-CM | POA: Diagnosis not present

## 2018-01-30 DIAGNOSIS — E1122 Type 2 diabetes mellitus with diabetic chronic kidney disease: Secondary | ICD-10-CM | POA: Diagnosis not present

## 2018-01-30 DIAGNOSIS — I132 Hypertensive heart and chronic kidney disease with heart failure and with stage 5 chronic kidney disease, or end stage renal disease: Secondary | ICD-10-CM | POA: Diagnosis not present

## 2018-01-30 DIAGNOSIS — R569 Unspecified convulsions: Secondary | ICD-10-CM | POA: Diagnosis not present

## 2018-01-30 DIAGNOSIS — N186 End stage renal disease: Secondary | ICD-10-CM | POA: Diagnosis not present

## 2018-01-30 DIAGNOSIS — R0602 Shortness of breath: Secondary | ICD-10-CM | POA: Diagnosis not present

## 2018-01-30 SURGERY — A/V FISTULAGRAM
Anesthesia: LOCAL

## 2018-01-30 NOTE — Telephone Encounter (Signed)
Verbal orders given via vm.

## 2018-01-30 NOTE — Telephone Encounter (Unsigned)
Copied from [place] [id number]. Topic: General - Other >> Jan 30, 2018  2:17 PM Judyann Munson wrote: Reason for CRM: : Tommi Rumps from [facility]  called to see if she can  can get verbal orders  for nursing for 6 weeks , best call back number is  [phone]

## 2018-01-31 ENCOUNTER — Telehealth: Payer: Self-pay | Admitting: *Deleted

## 2018-01-31 DIAGNOSIS — N2581 Secondary hyperparathyroidism of renal origin: Secondary | ICD-10-CM | POA: Diagnosis not present

## 2018-01-31 DIAGNOSIS — D509 Iron deficiency anemia, unspecified: Secondary | ICD-10-CM | POA: Diagnosis not present

## 2018-01-31 DIAGNOSIS — D631 Anemia in chronic kidney disease: Secondary | ICD-10-CM | POA: Diagnosis not present

## 2018-01-31 DIAGNOSIS — A4101 Sepsis due to Methicillin susceptible Staphylococcus aureus: Secondary | ICD-10-CM | POA: Diagnosis not present

## 2018-01-31 DIAGNOSIS — N186 End stage renal disease: Secondary | ICD-10-CM | POA: Diagnosis not present

## 2018-01-31 NOTE — Telephone Encounter (Signed)
Received Home Health Certification and Plan of Care; forwarded to provider/SLS 07/30   

## 2018-02-02 DIAGNOSIS — Z992 Dependence on renal dialysis: Secondary | ICD-10-CM | POA: Diagnosis not present

## 2018-02-02 DIAGNOSIS — N2581 Secondary hyperparathyroidism of renal origin: Secondary | ICD-10-CM | POA: Diagnosis not present

## 2018-02-02 DIAGNOSIS — N186 End stage renal disease: Secondary | ICD-10-CM | POA: Diagnosis not present

## 2018-02-02 DIAGNOSIS — E1122 Type 2 diabetes mellitus with diabetic chronic kidney disease: Secondary | ICD-10-CM | POA: Diagnosis not present

## 2018-02-02 DIAGNOSIS — D509 Iron deficiency anemia, unspecified: Secondary | ICD-10-CM | POA: Diagnosis not present

## 2018-02-02 DIAGNOSIS — E1129 Type 2 diabetes mellitus with other diabetic kidney complication: Secondary | ICD-10-CM | POA: Diagnosis not present

## 2018-02-02 DIAGNOSIS — A088 Other specified intestinal infections: Secondary | ICD-10-CM | POA: Diagnosis not present

## 2018-02-02 DIAGNOSIS — D631 Anemia in chronic kidney disease: Secondary | ICD-10-CM | POA: Diagnosis not present

## 2018-02-03 ENCOUNTER — Telehealth: Payer: Self-pay | Admitting: Pulmonary Disease

## 2018-02-03 DIAGNOSIS — G4733 Obstructive sleep apnea (adult) (pediatric): Secondary | ICD-10-CM

## 2018-02-03 NOTE — Telephone Encounter (Signed)
Pt was in office yesterday to see Bryan Wilkerson in [facility] for HST  Pt is on O2, Bryan Wilkerson has advised pt he will need to go for inlab sleep study VS please advise if this is okay to schedule for pt.

## 2018-02-03 NOTE — Telephone Encounter (Signed)
Okay to change to split night sleep study.

## 2018-02-04 DIAGNOSIS — E1122 Type 2 diabetes mellitus with diabetic chronic kidney disease: Secondary | ICD-10-CM | POA: Diagnosis not present

## 2018-02-04 DIAGNOSIS — N2581 Secondary hyperparathyroidism of renal origin: Secondary | ICD-10-CM | POA: Diagnosis not present

## 2018-02-04 DIAGNOSIS — N186 End stage renal disease: Secondary | ICD-10-CM | POA: Diagnosis not present

## 2018-02-04 DIAGNOSIS — D509 Iron deficiency anemia, unspecified: Secondary | ICD-10-CM | POA: Diagnosis not present

## 2018-02-04 DIAGNOSIS — D631 Anemia in chronic kidney disease: Secondary | ICD-10-CM | POA: Diagnosis not present

## 2018-02-04 DIAGNOSIS — A088 Other specified intestinal infections: Secondary | ICD-10-CM | POA: Diagnosis not present

## 2018-02-06 NOTE — Telephone Encounter (Signed)
Placed order for inlab sleep study today. Pt is on O2 con't and needs to have in lab sleep study completed VS approved to send order, placed order today. Nothing further needed at this time.

## 2018-02-07 ENCOUNTER — Telehealth: Payer: Self-pay | Admitting: Pulmonary Disease

## 2018-02-07 ENCOUNTER — Encounter (HOSPITAL_BASED_OUTPATIENT_CLINIC_OR_DEPARTMENT_OTHER): Payer: Medicare Other

## 2018-02-07 DIAGNOSIS — N186 End stage renal disease: Secondary | ICD-10-CM | POA: Diagnosis not present

## 2018-02-07 DIAGNOSIS — D631 Anemia in chronic kidney disease: Secondary | ICD-10-CM | POA: Diagnosis not present

## 2018-02-07 DIAGNOSIS — A088 Other specified intestinal infections: Secondary | ICD-10-CM | POA: Diagnosis not present

## 2018-02-07 DIAGNOSIS — E1122 Type 2 diabetes mellitus with diabetic chronic kidney disease: Secondary | ICD-10-CM | POA: Diagnosis not present

## 2018-02-07 DIAGNOSIS — D509 Iron deficiency anemia, unspecified: Secondary | ICD-10-CM | POA: Diagnosis not present

## 2018-02-07 DIAGNOSIS — N2581 Secondary hyperparathyroidism of renal origin: Secondary | ICD-10-CM | POA: Diagnosis not present

## 2018-02-07 NOTE — Telephone Encounter (Signed)
Terri at the [facility] spoke with wife and rescheduled for 02/17/18

## 2018-02-08 DIAGNOSIS — T827XXA Infection and inflammatory reaction due to other cardiac and vascular devices, implants and grafts, initial encounter: Secondary | ICD-10-CM | POA: Diagnosis not present

## 2018-02-08 DIAGNOSIS — E1122 Type 2 diabetes mellitus with diabetic chronic kidney disease: Secondary | ICD-10-CM | POA: Diagnosis not present

## 2018-02-08 DIAGNOSIS — I132 Hypertensive heart and chronic kidney disease with heart failure and with stage 5 chronic kidney disease, or end stage renal disease: Secondary | ICD-10-CM | POA: Diagnosis not present

## 2018-02-08 DIAGNOSIS — I5030 Unspecified diastolic (congestive) heart failure: Secondary | ICD-10-CM | POA: Diagnosis not present

## 2018-02-08 DIAGNOSIS — R569 Unspecified convulsions: Secondary | ICD-10-CM | POA: Diagnosis not present

## 2018-02-08 DIAGNOSIS — N186 End stage renal disease: Secondary | ICD-10-CM | POA: Diagnosis not present

## 2018-02-09 ENCOUNTER — Telehealth: Payer: Self-pay | Admitting: *Deleted

## 2018-02-09 DIAGNOSIS — E1122 Type 2 diabetes mellitus with diabetic chronic kidney disease: Secondary | ICD-10-CM | POA: Diagnosis not present

## 2018-02-09 DIAGNOSIS — A088 Other specified intestinal infections: Secondary | ICD-10-CM | POA: Diagnosis not present

## 2018-02-09 DIAGNOSIS — D509 Iron deficiency anemia, unspecified: Secondary | ICD-10-CM | POA: Diagnosis not present

## 2018-02-09 DIAGNOSIS — D631 Anemia in chronic kidney disease: Secondary | ICD-10-CM | POA: Diagnosis not present

## 2018-02-09 DIAGNOSIS — N186 End stage renal disease: Secondary | ICD-10-CM | POA: Diagnosis not present

## 2018-02-09 DIAGNOSIS — N2581 Secondary hyperparathyroidism of renal origin: Secondary | ICD-10-CM | POA: Diagnosis not present

## 2018-02-09 NOTE — Telephone Encounter (Signed)
Received Physician Orders/CMN Oxygen from [facility]; forwarded to provider/SLS 08/08

## 2018-02-11 DIAGNOSIS — D509 Iron deficiency anemia, unspecified: Secondary | ICD-10-CM | POA: Diagnosis not present

## 2018-02-11 DIAGNOSIS — D631 Anemia in chronic kidney disease: Secondary | ICD-10-CM | POA: Diagnosis not present

## 2018-02-11 DIAGNOSIS — N186 End stage renal disease: Secondary | ICD-10-CM | POA: Diagnosis not present

## 2018-02-11 DIAGNOSIS — N2581 Secondary hyperparathyroidism of renal origin: Secondary | ICD-10-CM | POA: Diagnosis not present

## 2018-02-11 DIAGNOSIS — A088 Other specified intestinal infections: Secondary | ICD-10-CM | POA: Diagnosis not present

## 2018-02-11 DIAGNOSIS — E1122 Type 2 diabetes mellitus with diabetic chronic kidney disease: Secondary | ICD-10-CM | POA: Diagnosis not present

## 2018-02-13 DIAGNOSIS — T85611D Breakdown (mechanical) of intraperitoneal dialysis catheter, subsequent encounter: Secondary | ICD-10-CM | POA: Diagnosis not present

## 2018-02-13 DIAGNOSIS — N186 End stage renal disease: Secondary | ICD-10-CM | POA: Diagnosis not present

## 2018-02-13 DIAGNOSIS — G939 Disorder of brain, unspecified: Secondary | ICD-10-CM | POA: Diagnosis not present

## 2018-02-13 DIAGNOSIS — T8571XA Infection and inflammatory reaction due to peritoneal dialysis catheter, initial encounter: Secondary | ICD-10-CM | POA: Diagnosis not present

## 2018-02-13 DIAGNOSIS — K659 Peritonitis, unspecified: Secondary | ICD-10-CM | POA: Diagnosis not present

## 2018-02-13 DIAGNOSIS — R569 Unspecified convulsions: Secondary | ICD-10-CM | POA: Diagnosis not present

## 2018-02-13 DIAGNOSIS — Z4902 Encounter for fitting and adjustment of peritoneal dialysis catheter: Secondary | ICD-10-CM | POA: Diagnosis not present

## 2018-02-14 DIAGNOSIS — D631 Anemia in chronic kidney disease: Secondary | ICD-10-CM | POA: Diagnosis not present

## 2018-02-14 DIAGNOSIS — A088 Other specified intestinal infections: Secondary | ICD-10-CM | POA: Diagnosis not present

## 2018-02-14 DIAGNOSIS — G939 Disorder of brain, unspecified: Secondary | ICD-10-CM | POA: Diagnosis not present

## 2018-02-14 DIAGNOSIS — R569 Unspecified convulsions: Secondary | ICD-10-CM | POA: Diagnosis not present

## 2018-02-14 DIAGNOSIS — E1122 Type 2 diabetes mellitus with diabetic chronic kidney disease: Secondary | ICD-10-CM | POA: Diagnosis not present

## 2018-02-14 DIAGNOSIS — D509 Iron deficiency anemia, unspecified: Secondary | ICD-10-CM | POA: Diagnosis not present

## 2018-02-14 DIAGNOSIS — N2581 Secondary hyperparathyroidism of renal origin: Secondary | ICD-10-CM | POA: Diagnosis not present

## 2018-02-14 DIAGNOSIS — N186 End stage renal disease: Secondary | ICD-10-CM | POA: Diagnosis not present

## 2018-02-15 DIAGNOSIS — G939 Disorder of brain, unspecified: Secondary | ICD-10-CM | POA: Diagnosis not present

## 2018-02-15 DIAGNOSIS — R569 Unspecified convulsions: Secondary | ICD-10-CM | POA: Diagnosis not present

## 2018-02-16 DIAGNOSIS — G939 Disorder of brain, unspecified: Secondary | ICD-10-CM | POA: Diagnosis not present

## 2018-02-16 DIAGNOSIS — R569 Unspecified convulsions: Secondary | ICD-10-CM | POA: Diagnosis not present

## 2018-02-16 DIAGNOSIS — N2581 Secondary hyperparathyroidism of renal origin: Secondary | ICD-10-CM | POA: Diagnosis not present

## 2018-02-16 DIAGNOSIS — N186 End stage renal disease: Secondary | ICD-10-CM | POA: Diagnosis not present

## 2018-02-16 DIAGNOSIS — D631 Anemia in chronic kidney disease: Secondary | ICD-10-CM | POA: Diagnosis not present

## 2018-02-16 DIAGNOSIS — E1122 Type 2 diabetes mellitus with diabetic chronic kidney disease: Secondary | ICD-10-CM | POA: Diagnosis not present

## 2018-02-16 DIAGNOSIS — A088 Other specified intestinal infections: Secondary | ICD-10-CM | POA: Diagnosis not present

## 2018-02-16 DIAGNOSIS — D509 Iron deficiency anemia, unspecified: Secondary | ICD-10-CM | POA: Diagnosis not present

## 2018-02-17 ENCOUNTER — Ambulatory Visit (HOSPITAL_BASED_OUTPATIENT_CLINIC_OR_DEPARTMENT_OTHER): Payer: Medicare Other | Attending: Pulmonary Disease | Admitting: Pulmonary Disease

## 2018-02-17 VITALS — Ht 74.0 in | Wt 185.0 lb

## 2018-02-17 DIAGNOSIS — G4734 Idiopathic sleep related nonobstructive alveolar hypoventilation: Secondary | ICD-10-CM

## 2018-02-17 DIAGNOSIS — E1122 Type 2 diabetes mellitus with diabetic chronic kidney disease: Secondary | ICD-10-CM | POA: Diagnosis not present

## 2018-02-17 DIAGNOSIS — N186 End stage renal disease: Secondary | ICD-10-CM | POA: Diagnosis not present

## 2018-02-17 DIAGNOSIS — I5030 Unspecified diastolic (congestive) heart failure: Secondary | ICD-10-CM | POA: Diagnosis not present

## 2018-02-17 DIAGNOSIS — T827XXA Infection and inflammatory reaction due to other cardiac and vascular devices, implants and grafts, initial encounter: Secondary | ICD-10-CM | POA: Diagnosis not present

## 2018-02-17 DIAGNOSIS — G4733 Obstructive sleep apnea (adult) (pediatric): Secondary | ICD-10-CM | POA: Insufficient documentation

## 2018-02-17 DIAGNOSIS — R569 Unspecified convulsions: Secondary | ICD-10-CM | POA: Diagnosis not present

## 2018-02-17 DIAGNOSIS — I132 Hypertensive heart and chronic kidney disease with heart failure and with stage 5 chronic kidney disease, or end stage renal disease: Secondary | ICD-10-CM | POA: Diagnosis not present

## 2018-02-17 DIAGNOSIS — R0683 Snoring: Secondary | ICD-10-CM

## 2018-02-18 DIAGNOSIS — A088 Other specified intestinal infections: Secondary | ICD-10-CM | POA: Diagnosis not present

## 2018-02-18 DIAGNOSIS — N2581 Secondary hyperparathyroidism of renal origin: Secondary | ICD-10-CM | POA: Diagnosis not present

## 2018-02-18 DIAGNOSIS — E1122 Type 2 diabetes mellitus with diabetic chronic kidney disease: Secondary | ICD-10-CM | POA: Diagnosis not present

## 2018-02-18 DIAGNOSIS — N186 End stage renal disease: Secondary | ICD-10-CM | POA: Diagnosis not present

## 2018-02-18 DIAGNOSIS — D631 Anemia in chronic kidney disease: Secondary | ICD-10-CM | POA: Diagnosis not present

## 2018-02-18 DIAGNOSIS — D509 Iron deficiency anemia, unspecified: Secondary | ICD-10-CM | POA: Diagnosis not present

## 2018-02-21 DIAGNOSIS — E1122 Type 2 diabetes mellitus with diabetic chronic kidney disease: Secondary | ICD-10-CM | POA: Diagnosis not present

## 2018-02-21 DIAGNOSIS — D509 Iron deficiency anemia, unspecified: Secondary | ICD-10-CM | POA: Diagnosis not present

## 2018-02-21 DIAGNOSIS — A088 Other specified intestinal infections: Secondary | ICD-10-CM | POA: Diagnosis not present

## 2018-02-21 DIAGNOSIS — N2581 Secondary hyperparathyroidism of renal origin: Secondary | ICD-10-CM | POA: Diagnosis not present

## 2018-02-21 DIAGNOSIS — N186 End stage renal disease: Secondary | ICD-10-CM | POA: Diagnosis not present

## 2018-02-21 DIAGNOSIS — D631 Anemia in chronic kidney disease: Secondary | ICD-10-CM | POA: Diagnosis not present

## 2018-02-23 DIAGNOSIS — A088 Other specified intestinal infections: Secondary | ICD-10-CM | POA: Diagnosis not present

## 2018-02-23 DIAGNOSIS — D509 Iron deficiency anemia, unspecified: Secondary | ICD-10-CM | POA: Diagnosis not present

## 2018-02-23 DIAGNOSIS — N186 End stage renal disease: Secondary | ICD-10-CM | POA: Diagnosis not present

## 2018-02-23 DIAGNOSIS — E1122 Type 2 diabetes mellitus with diabetic chronic kidney disease: Secondary | ICD-10-CM | POA: Diagnosis not present

## 2018-02-23 DIAGNOSIS — G4733 Obstructive sleep apnea (adult) (pediatric): Secondary | ICD-10-CM | POA: Diagnosis not present

## 2018-02-23 DIAGNOSIS — D631 Anemia in chronic kidney disease: Secondary | ICD-10-CM | POA: Diagnosis not present

## 2018-02-23 DIAGNOSIS — N2581 Secondary hyperparathyroidism of renal origin: Secondary | ICD-10-CM | POA: Diagnosis not present

## 2018-02-23 NOTE — Procedures (Signed)
    Patient Name: Bryan Wilkerson, Bryan Wilkerson Date: 02/17/2018   Gender: Male  D.O.B: 10-01-1965  Age (years): 42  Referring Provider: Chesley Mires MD, ABSM  Height (inches): 72  Interpreting Physician: Chesley Mires MD, ABSM  Weight (lbs): 185  RPSGT: Baxter Flattery  BMI: 25  MRN: [id number]  Neck Size: 14.50  CLINICAL INFORMATION  Sleep Study Type: NPSG Indication for sleep study: 52 yo male with snoring, and history of pulmonary hypertension. Epworth Sleepiness Score: 20 SLEEP STUDY TECHNIQUE  As per the AASM Manual for the Scoring of Sleep and Associated Events v2.3 (April 2016) with a hypopnea requiring 4% desaturations. The channels recorded and monitored were frontal, central and occipital EEG, electrooculogram (EOG), submentalis EMG (chin), nasal and oral airflow, thoracic and abdominal wall motion, anterior tibialis EMG, snore microphone, electrocardiogram, and pulse oximetry. MEDICATIONS  Medications self-administered by patient taken the night of the study : AMLODIPINE, CARVEDILOL SLEEP ARCHITECTURE  The study was initiated at 10:16:38 PM and ended at 4:54:23 AM. Sleep onset time was 5.5 minutes and the sleep efficiency was 75.5%%. The total sleep time was 300.3 minutes. Stage REM latency was 135.0 minutes. The patient spent 2.5%% of the night in stage N1 sleep, 92.5%% in stage N2 sleep, 0.0%% in stage N3 and 5% in REM. Alpha intrusion was absent. Supine sleep was 84.14%. RESPIRATORY PARAMETERS  The overall apnea/hypopnea index (AHI) was 0.8 per hour. There were 0 total apneas, including 0 obstructive, 0 central and 0 mixed apneas. There were 4 hypopneas and 0 RERAs. The AHI during Stage REM sleep was 0.0 per hour. AHI while supine was 0.9 per hour. The mean oxygen saturation was 97.9%. The minimum SpO2 during sleep was 88.0%. The study was conducted with him using 3 liters supplemental oxygen. loud snoring was noted during this study. CARDIAC DATA  The 2 lead EKG  demonstrated sinus rhythm. The mean heart rate was 57.8 beats per minute. Other EKG findings include: None.  LEG MOVEMENT DATA  The total PLMS were 0 with a resulting PLMS index of 0.0. Associated arousal with leg movement index was 0.0 . IMPRESSIONS  - He did not have sufficient number of respiratory events to meet diagnostic criteria for sleep apnea. His AHI was 0.8 with an SpO2 low of 88%. The study was conducted with him using 3 liters oxygen.  - Loud snoring was noted. DIAGNOSIS  - Nocturnal Hypoxemia. - Snoring. RECOMMENDATIONS  - Continue 3 liters oxygen at night. [Electronically signed] 02/23/2018 10:24 AM Chesley Mires MD, ABSM  Diplomate, American Board of Sleep Medicine  NPI: [id number]

## 2018-02-24 ENCOUNTER — Telehealth: Payer: Self-pay | Admitting: *Deleted

## 2018-02-24 NOTE — Telephone Encounter (Signed)
Copied from [place] [id number]. Topic: General - Other >> Feb 24, 2018  9:05 AM Judyann Munson wrote: Reason for CRM: elizabeth- [facility] is calling to advise they will be missing a weekly visit due to the patient not answering the phone and unable to leave message. Her Best contact number for Benjamine Mola is [phone] ext [phone]

## 2018-02-24 NOTE — Telephone Encounter (Signed)
Called back to acknowledge

## 2018-02-25 DIAGNOSIS — D509 Iron deficiency anemia, unspecified: Secondary | ICD-10-CM | POA: Diagnosis not present

## 2018-02-25 DIAGNOSIS — E1122 Type 2 diabetes mellitus with diabetic chronic kidney disease: Secondary | ICD-10-CM | POA: Diagnosis not present

## 2018-02-25 DIAGNOSIS — A088 Other specified intestinal infections: Secondary | ICD-10-CM | POA: Diagnosis not present

## 2018-02-25 DIAGNOSIS — N186 End stage renal disease: Secondary | ICD-10-CM | POA: Diagnosis not present

## 2018-02-25 DIAGNOSIS — N2581 Secondary hyperparathyroidism of renal origin: Secondary | ICD-10-CM | POA: Diagnosis not present

## 2018-02-25 DIAGNOSIS — D631 Anemia in chronic kidney disease: Secondary | ICD-10-CM | POA: Diagnosis not present

## 2018-02-28 DIAGNOSIS — D631 Anemia in chronic kidney disease: Secondary | ICD-10-CM | POA: Diagnosis not present

## 2018-02-28 DIAGNOSIS — N2581 Secondary hyperparathyroidism of renal origin: Secondary | ICD-10-CM | POA: Diagnosis not present

## 2018-02-28 DIAGNOSIS — A088 Other specified intestinal infections: Secondary | ICD-10-CM | POA: Diagnosis not present

## 2018-02-28 DIAGNOSIS — D509 Iron deficiency anemia, unspecified: Secondary | ICD-10-CM | POA: Diagnosis not present

## 2018-02-28 DIAGNOSIS — E1122 Type 2 diabetes mellitus with diabetic chronic kidney disease: Secondary | ICD-10-CM | POA: Diagnosis not present

## 2018-02-28 DIAGNOSIS — N186 End stage renal disease: Secondary | ICD-10-CM | POA: Diagnosis not present

## 2018-03-02 ENCOUNTER — Ambulatory Visit (INDEPENDENT_AMBULATORY_CARE_PROVIDER_SITE_OTHER): Payer: Medicare Other | Admitting: Pulmonary Disease

## 2018-03-02 ENCOUNTER — Encounter: Payer: Self-pay | Admitting: Pulmonary Disease

## 2018-03-02 VITALS — BP 118/82 | HR 59 | Ht 74.0 in | Wt 188.0 lb

## 2018-03-02 DIAGNOSIS — I270 Primary pulmonary hypertension: Secondary | ICD-10-CM

## 2018-03-02 DIAGNOSIS — D631 Anemia in chronic kidney disease: Secondary | ICD-10-CM | POA: Diagnosis not present

## 2018-03-02 DIAGNOSIS — I272 Pulmonary hypertension, unspecified: Secondary | ICD-10-CM

## 2018-03-02 DIAGNOSIS — D509 Iron deficiency anemia, unspecified: Secondary | ICD-10-CM | POA: Diagnosis not present

## 2018-03-02 DIAGNOSIS — E1122 Type 2 diabetes mellitus with diabetic chronic kidney disease: Secondary | ICD-10-CM | POA: Diagnosis not present

## 2018-03-02 DIAGNOSIS — N2581 Secondary hyperparathyroidism of renal origin: Secondary | ICD-10-CM | POA: Diagnosis not present

## 2018-03-02 DIAGNOSIS — J9611 Chronic respiratory failure with hypoxia: Secondary | ICD-10-CM

## 2018-03-02 DIAGNOSIS — A088 Other specified intestinal infections: Secondary | ICD-10-CM | POA: Diagnosis not present

## 2018-03-02 DIAGNOSIS — N186 End stage renal disease: Secondary | ICD-10-CM | POA: Diagnosis not present

## 2018-03-02 NOTE — Patient Instructions (Signed)
Will schedule pulmonary function test, chest xray and ventilation-perfusion lung scan  Follow up in 2 weeks with Dr. Halford Chessman or Nurse Practitioner

## 2018-03-02 NOTE — Progress Notes (Signed)
[facility]  Chief Complaint  Patient presents with  . Follow-up    F/U per Wyn Quaker. Patient states he has been feeling fine since last visit. Patient has had a split night done since last visit.     Constitutional: BP 118/82 (BP Location: Left Arm, Patient Position: Sitting, Cuff Size: Normal)   Pulse (!) 59   Ht 6\' 2"  (1.88 m)   Wt 188 lb (85.3 kg)   SpO2 97%   BMI 24.14 kg/m   History of Present Illness: Bryan Wilkerson is a 52 y.o. 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  He had sleep study done 02/17/18 that did not show sleep apnea.  CXR 01/16/18 (reviewed by me) >> scar at left base.  Echo from 6/38/75 showed diastolic CHF, mild MR, and significant elevation in pulmonary pressures.  He has not had PFT, V/Q scan, or RHC.  He was being evaluated for renal transplant.  This was put on hold due to his oxygen needs and deconditioning.  He uses a wheelchair.  He never smoked.  Had pneumonia before.  No history of TB.  No prior hx of DVT or PE.  Has intermittent cough.  Not having much sputum.  Denies fever, chest pain, or hemoptysis.  Was on PD, but now on HD.    He was in hospital for infection and seizures in setting of PD.  This was toward the end of July 2019.  He was started on supplemental oxygen then.    Comprehensive Respiratory Exam:  Appearance - thin, wearing oxygen ENMT - nasal mucosa moist, turbinates clear, midline nasal septum, no dental lesions, no gingival bleeding, no oral exudates, no tonsillar hypertrophy Neck - no masses, trachea midline, no thyromegaly, no elevation in JVP Respiratory - normal appearance of chest wall, normal respiratory effort w/o accessory muscle use, no dullness on percussion, no wheezing or rales CV - s1s2 regular rate and rhythm, no murmurs, no peripheral edema, , radial pulses symmetric GI -  soft, non tender, no masses Lymph - no adenopathy noted in neck and axillary areas MSK - normal muscle strength and tone, normal gait Ext - no cyanosis, clubbing, or joint inflammation noted Skin - no rashes, lesions, or ulcers Neuro - oriented to person, place, and time Psych - normal mood and affect   Assessment/Plan:  Pulmonary hypertension. - will arrange for PFT, V/Q scan to further assess for cause of this - f/u with cardiology for management of diastolic CHF - might need LHC/RHC at some point to further assess  Chronic respiratory failure with hypoxia. - continue 3 liters oxygen 24/7 - plan to repeat Echo in next few months, and determine if PA pressures are improved with supplemental oxygen therapy  ESRD on HD. - f/u with nephrology in [place]  Seizures. - f/u with neurology at [facility]   Patient Instructions  Will schedule pulmonary function test, chest xray and ventilation-perfusion lung scan  Follow up in 2 weeks with Dr. Halford Chessman or Nurse Practitioner      Chesley Mires, MD [place] 03/02/2018, 10:12 AM  Flow Sheet  Pulmonary tests:  Sleep tests: PSG 02/17/18 >> AHI 0.8, SpO2 low 88%.  Wore 3 liters oxygen.  Cardiac tests: Echo 01/27/18 >> EF 55 to 60%, grade 2 DD, mild MR, PAS 69 mmHg  Past Medical History: He  has a past medical history of Anemia, CHF (  congestive heart failure) ([place]), Diabetic retinopathy ([place]), ESRD on peritoneal dialysis [facility]), Hypertension, Pneumonia (2016; 04/19/2017), Psoriasis, and Type I diabetes mellitus ([place]).  Past Surgical History: He  has a past surgical history that includes Cataract extraction w/ intraocular lens  implant, bilateral (Bilateral); AV fistula placement (Right, 12/05/2013); Fistulogram (Right, 07/16/2014); Ligation of competing branches of arteriovenous fistula (Right, 07/16/2014); AV fistula placement (Left, 07/20/2016); Retinal detachment surgery (Right); PORTA CATH INSERTION (Right); Peritoneal  catheter insertion (Left, ~ 08/2016); Eye surgery; Port-a-cath removal (2017); and IR THORACENTESIS ASP PLEURAL SPACE W/IMG GUIDE (05/02/2017).  Family History: His family history includes Heart attack in his mother; Hypertension in his mother.  Social History: He  reports that he has never smoked. He has never used smokeless tobacco. He reports that he does not drink alcohol or use drugs.  Medications: Allergies as of 03/02/2018      Reactions   Penicillins Other (See Comments)   UNSPECIFIED REACTION FROM CHILDHOOD Has patient had a PCN reaction causing immediate rash, facial/tongue/throat swelling, SOB or lightheadedness with hypotension:Yes Has patient had a PCN reaction causing severe rash involving mucus membranes or skin necrosis:No Has patient had a PCN reaction that required hospitalization:Yes Has patient had a PCN reaction occurring within the last 10 years:No If all of the above answers are "NO", then may proceed with Cephalosporin use.      Medication List        Accurate as of 03/02/18 10:12 AM. Always use your most recent med list.          amLODipine 5 MG tablet Commonly known as:  NORVASC Take 5 mg by mouth daily.   calcitRIOL 0.25 MCG capsule Commonly known as:  ROCALTROL Take 2 capsules (0.5 mcg total) by mouth daily.   carvedilol 6.25 MG tablet Commonly known as:  COREG Take 1 tablet (6.25 mg total) by mouth 2 (two) times daily with a meal.   divalproex 500 MG DR tablet Commonly known as:  DEPAKOTE Take by mouth.   Lacosamide 150 MG Tabs Take by mouth.   LEVEMIR FLEXTOUCH 100 UNIT/ML Pen Generic drug:  Insulin Detemir Inject 2-8 Units into the skin daily at 10 pm. Sliding Scale   NOVOLOG FLEXPEN 100 UNIT/ML FlexPen Generic drug:  insulin aspart Sliding scale over  150-315 1 units   OTEZLA 30 MG Tabs Generic drug:  Apremilast   triamcinolone ointment 0.1 % Commonly known as:  KENALOG Apply 1 application topically as needed (psorasis).     VELPHORO 500 MG chewable tablet Generic drug:  sucroferric oxyhydroxide Chew 500 mg by mouth 3 (three) times daily with meals.

## 2018-03-03 ENCOUNTER — Ambulatory Visit (INDEPENDENT_AMBULATORY_CARE_PROVIDER_SITE_OTHER): Payer: Medicare Other | Admitting: Cardiology

## 2018-03-03 ENCOUNTER — Encounter: Payer: Self-pay | Admitting: Cardiology

## 2018-03-03 VITALS — BP 114/88 | HR 66 | Ht 74.0 in | Wt 183.0 lb

## 2018-03-03 DIAGNOSIS — E0865 Diabetes mellitus due to underlying condition with hyperglycemia: Secondary | ICD-10-CM

## 2018-03-03 DIAGNOSIS — I272 Pulmonary hypertension, unspecified: Secondary | ICD-10-CM | POA: Diagnosis not present

## 2018-03-03 DIAGNOSIS — N184 Chronic kidney disease, stage 4 (severe): Secondary | ICD-10-CM | POA: Diagnosis not present

## 2018-03-03 DIAGNOSIS — I5032 Chronic diastolic (congestive) heart failure: Secondary | ICD-10-CM

## 2018-03-03 DIAGNOSIS — Z452 Encounter for adjustment and management of vascular access device: Secondary | ICD-10-CM | POA: Diagnosis not present

## 2018-03-03 DIAGNOSIS — Z992 Dependence on renal dialysis: Secondary | ICD-10-CM | POA: Diagnosis not present

## 2018-03-03 DIAGNOSIS — Z794 Long term (current) use of insulin: Secondary | ICD-10-CM | POA: Diagnosis not present

## 2018-03-03 DIAGNOSIS — E0822 Diabetes mellitus due to underlying condition with diabetic chronic kidney disease: Secondary | ICD-10-CM

## 2018-03-03 DIAGNOSIS — I10 Essential (primary) hypertension: Secondary | ICD-10-CM | POA: Diagnosis not present

## 2018-03-03 DIAGNOSIS — N186 End stage renal disease: Secondary | ICD-10-CM | POA: Diagnosis not present

## 2018-03-03 DIAGNOSIS — IMO0002 Reserved for concepts with insufficient information to code with codable children: Secondary | ICD-10-CM

## 2018-03-03 NOTE — Progress Notes (Signed)
Cardiology Office Note:    Date:  03/03/2018   ID:  Bryan Wilkerson, DOB Oct 03, 1965, MRN [id number]  PCP:  [name], MD  Cardiologist:  Jenne Campus, MD    Referring MD: [name], MD   Chief Complaint  Patient presents with  . Follow-up    after echocardiogram and sleep study  Still short of breath  History of Present Illness:    Bryan Wilkerson is a 52 y.o. male who was referred to me because of diastolic congestive heart failure.  In the process of investigation we discovered he gets significant pulmonary hypertension his pulmonary artery pressure based on echocardiogram was in the neighborhood of 70 mmHg.  Sleep study was done which showed no significant sleep apnea but he does have some desaturation during the night.  He also seen pulmonary specialist and VQ scan as well as pulmonary function test has been scheduled for next week.  In the future he will require most likely left and right sided cardiac catheterization.  I will wait with the decision about timing of this procedure after pulmonary function test and VQ scan.  He will also be referred to pulmonary hypertension clinic for evaluation for his pulmonary hypertension.  Clinically he looks good, described to have some shortness of breath with exercise.  No chest pain tightness squeezing pressure burning chest  Past Medical History:  Diagnosis Date  . Anemia   . CHF (congestive heart failure) ([place])   . Diabetic retinopathy ([place])   . ESRD on peritoneal dialysis ([place])    "7 days/week" (04/20/2017)  . Hypertension   . Pneumonia 2016; 04/19/2017  . Psoriasis   . Type I diabetes mellitus ([place])     Past Surgical History:  Procedure Laterality Date  . AV FISTULA PLACEMENT Right 12/05/2013   Procedure: RADIOCEPHALIC VS. BRACHIOCEPHALIC ARTERIOVENOUS (AV) FISTULA CREATION;  Surgeon: Conrad Rebecca, MD;  Location: [facility];  Service: Vascular;  Laterality: Right;  . AV FISTULA PLACEMENT Left 07/20/2016   Procedure: LEFT ARM RADIOCEPHALIC ARTERIOVENOUS (AV) FISTULA CREATION;  Surgeon: Waynetta Sandy, MD;  Location: [facility];  Service: Vascular;  Laterality: Left;  . CATARACT EXTRACTION W/ INTRAOCULAR LENS  IMPLANT, BILATERAL Bilateral   . EYE SURGERY    . FISTULOGRAM Right 07/16/2014   Procedure: FISTULOGRAM;  Surgeon: Conrad [place], MD;  Location: [facility];  Service: Vascular;  Laterality: Right;  . IR THORACENTESIS ASP PLEURAL SPACE W/IMG GUIDE  05/02/2017  . LIGATION OF COMPETING BRANCHES OF ARTERIOVENOUS FISTULA Right 07/16/2014   Procedure: LIGATION OF COMPETING BRANCHES OF ARTERIOVENOUS FISTULA;  Surgeon: Conrad [place], MD;  Location: [facility];  Service: Vascular;  Laterality: Right;  . PERITONEAL CATHETER INSERTION Left ~ 08/2016  . PORT-A-CATH REMOVAL  2017  . PORTA CATH INSERTION Right    "for hemodialysis"  . RETINAL DETACHMENT SURGERY Right     Current Medications: Current Meds  Medication Sig  . amLODipine (NORVASC) 5 MG tablet Take 5 mg by mouth daily.   . calcitRIOL (ROCALTROL) 0.25 MCG capsule Take 2 capsules (0.5 mcg total) by mouth daily. (Patient taking differently: Take 0.25 mcg by mouth 3 (three) times daily. )  . carvedilol (COREG) 6.25 MG tablet Take 1 tablet (6.25 mg total) by mouth 2 (two) times daily with a meal.  . Lacosamide 150 MG TABS Take 150 mg by mouth daily.   [name] LEVEMIR FLEXTOUCH 100 UNIT/ML Pen Inject 2-8 Units into the skin daily at 10 pm. Sliding Scale  .  NOVOLOG FLEXPEN 100 UNIT/ML FlexPen Sliding scale over  150-315 1 units  . OTEZLA 30 MG TABS Take 30 mg by mouth 2 (two) times daily.   . OXYGEN Inhale 3 L into the lungs continuous.  . sucroferric oxyhydroxide (VELPHORO) 500 MG chewable tablet Chew 500 mg by mouth daily.   [name] triamcinolone ointment (KENALOG) 0.1 % Apply 1 application topically as needed (psorasis).      Allergies:   Penicillins   Social History   Socioeconomic History  . Marital status: Married    Spouse name: Not on file  . Number  of children: Not on file  . Years of education: Not on file  . Highest education level: Not on file  Occupational History  . Occupation: logistics  Social Needs  . Financial resource strain: Not on file  . Food insecurity:    Worry: Not on file    Inability: Not on file  . Transportation needs:    Medical: Not on file    Non-medical: Not on file  Tobacco Use  . Smoking status: Never Smoker  . Smokeless tobacco: Never Used  Substance and Sexual Activity  . Alcohol use: No    Alcohol/week: 0.0 standard drinks  . Drug use: No  . Sexual activity: Yes  Lifestyle  . Physical activity:    Days per week: Not on file    Minutes per session: Not on file  . Stress: Not on file  Relationships  . Social connections:    Talks on phone: Not on file    Gets together: Not on file    Attends religious service: Not on file    Active member of club or organization: Not on file    Attends meetings of clubs or organizations: Not on file    Relationship status: Not on file  Other Topics Concern  . Not on file  Social History Narrative   [facility] (04/22/17):   Lives with wife. Does have an indoor dog. No bird or mold exposure. No recent travel. He works as a Counsellor.     Family History: The patient's family history includes Heart attack in his mother; Hypertension in his mother. There is no history of Chronic Renal Failure, Diabetes, Stroke, or Cancer. ROS:   Please see the history of present illness.    All 14 point review of systems negative except as described per history of present illness  EKGs/Labs/Other Studies Reviewed:    The right ventricular systolic pressure was increased consistent   with severe pulmonary hypertension. Normal LVEF.   Biatyrial enlargement.   Moderate TR, mild MR   Pulmonary HTN - PAP 70 mmHg   PSeudonormal transmitral flow pattern.  Recent Labs: 08/03/2017: Magnesium 3.4 08/27/2017: ALT 21; BUN 10; Creatinine, Ser 6.02; Hemoglobin 9.7;  Platelets 154; Potassium 2.9; Sodium 134; TSH 3.765  Recent Lipid Panel    Component Value Date/Time   CHOL 150 12/30/2012 0400   TRIG 95 12/30/2012 0400   HDL 53 12/30/2012 0400   CHOLHDL 2.8 12/30/2012 0400   VLDL 19 12/30/2012 0400   LDLCALC 78 12/30/2012 0400    Physical Exam:    VS:  BP 114/88 (BP Location: Right Arm, Patient Position: Sitting, Cuff Size: Normal)   Pulse 66   Ht 6\' 2"  (1.88 m)   Wt 183 lb (83 kg)   SpO2 93%   BMI 23.50 kg/m     Wt Readings from Last 3 Encounters:  03/03/18 183 lb (83 kg)  03/02/18 188  lb (85.3 kg)  02/17/18 185 lb (83.9 kg)     GEN:  Well nourished, well developed in no acute distress HEENT: Normal NECK: No JVD; No carotid bruits LYMPHATICS: No lymphadenopathy CARDIAC: RRR, no murmurs, no rubs, no gallops RESPIRATORY:  Clear to auscultation without rales, wheezing or rhonchi  ABDOMEN: Soft, non-tender, non-distended MUSCULOSKELETAL:  No edema; No deformity  SKIN: Warm and dry LOWER EXTREMITIES: no swelling NEUROLOGIC:  Alert and oriented x 3 PSYCHIATRIC:  Normal affect   ASSESSMENT:    1. Chronic diastolic congestive heart failure, NYHA class 2 ([place])   2. Pulmonary hypertension, unspecified ([place])   3. Essential hypertension, benign   4. Diabetes mellitus due to underlying condition, uncontrolled, with stage 4 chronic kidney disease, with long-term current use of insulin ([place])   5. ESRD on dialysis [facility])    PLAN:    In order of problems listed above:  1. Chronic diastolic congestive heart failure appears to be compensated we will continue present management. 2. Pulmonary hypertension awaiting pulmonary function test as well as VQ scan.  Then he will be referred to pulmonary hypertension clinic. 3. Essential hypertension his blood pressure well controlled we will continue present management. 4. End-stage renal disease on dialysis.   Medication Adjustments/Labs and Tests Ordered: Current medicines are reviewed at length  with the patient today.  Concerns regarding medicines are outlined above.  No orders of the defined types were placed in this encounter.  Medication changes: No orders of the defined types were placed in this encounter.   Signed, Park Liter, MD, [facility] 03/03/2018 11:14 AM    [place]

## 2018-03-03 NOTE — Patient Instructions (Signed)
Medication Instructions:  Your physician recommends that you continue on your current medications as directed. Please refer to the Current Medication list given to you today.   Labwork: None  Testing/Procedures: None  Follow-Up: Your physician recommends that you schedule a follow-up appointment in: 1 month.   If you need a refill on your cardiac medications before your next appointment, please call your pharmacy.   Thank you for choosing [facility]! Robyne Peers, RN [phone]

## 2018-03-04 DIAGNOSIS — A088 Other specified intestinal infections: Secondary | ICD-10-CM | POA: Diagnosis not present

## 2018-03-04 DIAGNOSIS — D509 Iron deficiency anemia, unspecified: Secondary | ICD-10-CM | POA: Diagnosis not present

## 2018-03-04 DIAGNOSIS — D631 Anemia in chronic kidney disease: Secondary | ICD-10-CM | POA: Diagnosis not present

## 2018-03-04 DIAGNOSIS — E1122 Type 2 diabetes mellitus with diabetic chronic kidney disease: Secondary | ICD-10-CM | POA: Diagnosis not present

## 2018-03-04 DIAGNOSIS — N2581 Secondary hyperparathyroidism of renal origin: Secondary | ICD-10-CM | POA: Diagnosis not present

## 2018-03-04 DIAGNOSIS — N186 End stage renal disease: Secondary | ICD-10-CM | POA: Diagnosis not present

## 2018-03-05 DIAGNOSIS — N186 End stage renal disease: Secondary | ICD-10-CM | POA: Diagnosis not present

## 2018-03-05 DIAGNOSIS — Z992 Dependence on renal dialysis: Secondary | ICD-10-CM | POA: Diagnosis not present

## 2018-03-05 DIAGNOSIS — E1129 Type 2 diabetes mellitus with other diabetic kidney complication: Secondary | ICD-10-CM | POA: Diagnosis not present

## 2018-03-07 ENCOUNTER — Ambulatory Visit (HOSPITAL_COMMUNITY): Payer: Medicare Other

## 2018-03-07 DIAGNOSIS — D631 Anemia in chronic kidney disease: Secondary | ICD-10-CM | POA: Diagnosis not present

## 2018-03-07 DIAGNOSIS — D509 Iron deficiency anemia, unspecified: Secondary | ICD-10-CM | POA: Diagnosis not present

## 2018-03-07 DIAGNOSIS — N186 End stage renal disease: Secondary | ICD-10-CM | POA: Diagnosis not present

## 2018-03-07 DIAGNOSIS — N2581 Secondary hyperparathyroidism of renal origin: Secondary | ICD-10-CM | POA: Diagnosis not present

## 2018-03-07 DIAGNOSIS — E1122 Type 2 diabetes mellitus with diabetic chronic kidney disease: Secondary | ICD-10-CM | POA: Diagnosis not present

## 2018-03-07 DIAGNOSIS — Z23 Encounter for immunization: Secondary | ICD-10-CM | POA: Diagnosis not present

## 2018-03-08 DIAGNOSIS — I5030 Unspecified diastolic (congestive) heart failure: Secondary | ICD-10-CM | POA: Diagnosis not present

## 2018-03-08 DIAGNOSIS — I132 Hypertensive heart and chronic kidney disease with heart failure and with stage 5 chronic kidney disease, or end stage renal disease: Secondary | ICD-10-CM | POA: Diagnosis not present

## 2018-03-08 DIAGNOSIS — R569 Unspecified convulsions: Secondary | ICD-10-CM | POA: Diagnosis not present

## 2018-03-08 DIAGNOSIS — N186 End stage renal disease: Secondary | ICD-10-CM | POA: Diagnosis not present

## 2018-03-08 DIAGNOSIS — E1122 Type 2 diabetes mellitus with diabetic chronic kidney disease: Secondary | ICD-10-CM | POA: Diagnosis not present

## 2018-03-08 DIAGNOSIS — T827XXA Infection and inflammatory reaction due to other cardiac and vascular devices, implants and grafts, initial encounter: Secondary | ICD-10-CM | POA: Diagnosis not present

## 2018-03-09 DIAGNOSIS — Z7409 Other reduced mobility: Secondary | ICD-10-CM | POA: Insufficient documentation

## 2018-03-09 DIAGNOSIS — Z789 Other specified health status: Secondary | ICD-10-CM | POA: Insufficient documentation

## 2018-03-09 DIAGNOSIS — Z23 Encounter for immunization: Secondary | ICD-10-CM | POA: Diagnosis not present

## 2018-03-09 DIAGNOSIS — N2581 Secondary hyperparathyroidism of renal origin: Secondary | ICD-10-CM | POA: Diagnosis not present

## 2018-03-09 DIAGNOSIS — E1122 Type 2 diabetes mellitus with diabetic chronic kidney disease: Secondary | ICD-10-CM | POA: Diagnosis not present

## 2018-03-09 DIAGNOSIS — N186 End stage renal disease: Secondary | ICD-10-CM | POA: Diagnosis not present

## 2018-03-09 DIAGNOSIS — D509 Iron deficiency anemia, unspecified: Secondary | ICD-10-CM | POA: Diagnosis not present

## 2018-03-09 DIAGNOSIS — D631 Anemia in chronic kidney disease: Secondary | ICD-10-CM | POA: Diagnosis not present

## 2018-03-10 ENCOUNTER — Encounter (HOSPITAL_COMMUNITY)
Admission: RE | Admit: 2018-03-10 | Discharge: 2018-03-10 | Disposition: A | Discharge status: Home / Self Care | Payer: Medicare Other | Source: Ambulatory Visit | Attending: Pulmonary Disease | Admitting: Pulmonary Disease

## 2018-03-10 DIAGNOSIS — I270 Primary pulmonary hypertension: Secondary | ICD-10-CM | POA: Insufficient documentation

## 2018-03-10 DIAGNOSIS — I272 Pulmonary hypertension, unspecified: Secondary | ICD-10-CM

## 2018-03-10 DIAGNOSIS — I5030 Unspecified diastolic (congestive) heart failure: Secondary | ICD-10-CM | POA: Diagnosis not present

## 2018-03-10 MED ORDER — TECHNETIUM TC 99M DIETHYLENETRIAME-PENTAACETIC ACID
30.0000 | Freq: Once | INTRAVENOUS | Status: AC | PRN
  Administered 2018-03-10: 30 via RESPIRATORY_TRACT

## 2018-03-11 DIAGNOSIS — N186 End stage renal disease: Secondary | ICD-10-CM | POA: Diagnosis not present

## 2018-03-11 DIAGNOSIS — D631 Anemia in chronic kidney disease: Secondary | ICD-10-CM | POA: Diagnosis not present

## 2018-03-11 DIAGNOSIS — D509 Iron deficiency anemia, unspecified: Secondary | ICD-10-CM | POA: Diagnosis not present

## 2018-03-11 DIAGNOSIS — Z23 Encounter for immunization: Secondary | ICD-10-CM | POA: Diagnosis not present

## 2018-03-11 DIAGNOSIS — N2581 Secondary hyperparathyroidism of renal origin: Secondary | ICD-10-CM | POA: Diagnosis not present

## 2018-03-11 DIAGNOSIS — E1122 Type 2 diabetes mellitus with diabetic chronic kidney disease: Secondary | ICD-10-CM | POA: Diagnosis not present

## 2018-03-14 DIAGNOSIS — E1122 Type 2 diabetes mellitus with diabetic chronic kidney disease: Secondary | ICD-10-CM | POA: Diagnosis not present

## 2018-03-14 DIAGNOSIS — N2581 Secondary hyperparathyroidism of renal origin: Secondary | ICD-10-CM | POA: Diagnosis not present

## 2018-03-14 DIAGNOSIS — D631 Anemia in chronic kidney disease: Secondary | ICD-10-CM | POA: Diagnosis not present

## 2018-03-14 DIAGNOSIS — D509 Iron deficiency anemia, unspecified: Secondary | ICD-10-CM | POA: Diagnosis not present

## 2018-03-14 DIAGNOSIS — N186 End stage renal disease: Secondary | ICD-10-CM | POA: Diagnosis not present

## 2018-03-14 DIAGNOSIS — Z23 Encounter for immunization: Secondary | ICD-10-CM | POA: Diagnosis not present

## 2018-03-16 DIAGNOSIS — N186 End stage renal disease: Secondary | ICD-10-CM | POA: Diagnosis not present

## 2018-03-16 DIAGNOSIS — D631 Anemia in chronic kidney disease: Secondary | ICD-10-CM | POA: Diagnosis not present

## 2018-03-16 DIAGNOSIS — E1122 Type 2 diabetes mellitus with diabetic chronic kidney disease: Secondary | ICD-10-CM | POA: Diagnosis not present

## 2018-03-16 DIAGNOSIS — D509 Iron deficiency anemia, unspecified: Secondary | ICD-10-CM | POA: Diagnosis not present

## 2018-03-16 DIAGNOSIS — N2581 Secondary hyperparathyroidism of renal origin: Secondary | ICD-10-CM | POA: Diagnosis not present

## 2018-03-16 DIAGNOSIS — Z23 Encounter for immunization: Secondary | ICD-10-CM | POA: Diagnosis not present

## 2018-03-18 DIAGNOSIS — Z23 Encounter for immunization: Secondary | ICD-10-CM | POA: Diagnosis not present

## 2018-03-18 DIAGNOSIS — N186 End stage renal disease: Secondary | ICD-10-CM | POA: Diagnosis not present

## 2018-03-18 DIAGNOSIS — D509 Iron deficiency anemia, unspecified: Secondary | ICD-10-CM | POA: Diagnosis not present

## 2018-03-18 DIAGNOSIS — E1122 Type 2 diabetes mellitus with diabetic chronic kidney disease: Secondary | ICD-10-CM | POA: Diagnosis not present

## 2018-03-18 DIAGNOSIS — D631 Anemia in chronic kidney disease: Secondary | ICD-10-CM | POA: Diagnosis not present

## 2018-03-18 DIAGNOSIS — N2581 Secondary hyperparathyroidism of renal origin: Secondary | ICD-10-CM | POA: Diagnosis not present

## 2018-03-21 DIAGNOSIS — D509 Iron deficiency anemia, unspecified: Secondary | ICD-10-CM | POA: Diagnosis not present

## 2018-03-21 DIAGNOSIS — E1122 Type 2 diabetes mellitus with diabetic chronic kidney disease: Secondary | ICD-10-CM | POA: Diagnosis not present

## 2018-03-21 DIAGNOSIS — N186 End stage renal disease: Secondary | ICD-10-CM | POA: Diagnosis not present

## 2018-03-21 DIAGNOSIS — D631 Anemia in chronic kidney disease: Secondary | ICD-10-CM | POA: Diagnosis not present

## 2018-03-21 DIAGNOSIS — N2581 Secondary hyperparathyroidism of renal origin: Secondary | ICD-10-CM | POA: Diagnosis not present

## 2018-03-21 DIAGNOSIS — Z23 Encounter for immunization: Secondary | ICD-10-CM | POA: Diagnosis not present

## 2018-03-23 DIAGNOSIS — N2581 Secondary hyperparathyroidism of renal origin: Secondary | ICD-10-CM | POA: Diagnosis not present

## 2018-03-23 DIAGNOSIS — D631 Anemia in chronic kidney disease: Secondary | ICD-10-CM | POA: Diagnosis not present

## 2018-03-23 DIAGNOSIS — Z23 Encounter for immunization: Secondary | ICD-10-CM | POA: Diagnosis not present

## 2018-03-23 DIAGNOSIS — N186 End stage renal disease: Secondary | ICD-10-CM | POA: Diagnosis not present

## 2018-03-23 DIAGNOSIS — E1122 Type 2 diabetes mellitus with diabetic chronic kidney disease: Secondary | ICD-10-CM | POA: Diagnosis not present

## 2018-03-23 DIAGNOSIS — D509 Iron deficiency anemia, unspecified: Secondary | ICD-10-CM | POA: Diagnosis not present

## 2018-03-25 DIAGNOSIS — Z23 Encounter for immunization: Secondary | ICD-10-CM | POA: Diagnosis not present

## 2018-03-25 DIAGNOSIS — D509 Iron deficiency anemia, unspecified: Secondary | ICD-10-CM | POA: Diagnosis not present

## 2018-03-25 DIAGNOSIS — N2581 Secondary hyperparathyroidism of renal origin: Secondary | ICD-10-CM | POA: Diagnosis not present

## 2018-03-25 DIAGNOSIS — N186 End stage renal disease: Secondary | ICD-10-CM | POA: Diagnosis not present

## 2018-03-25 DIAGNOSIS — D631 Anemia in chronic kidney disease: Secondary | ICD-10-CM | POA: Diagnosis not present

## 2018-03-25 DIAGNOSIS — E1122 Type 2 diabetes mellitus with diabetic chronic kidney disease: Secondary | ICD-10-CM | POA: Diagnosis not present

## 2018-03-28 ENCOUNTER — Telehealth: Payer: Self-pay | Admitting: Pulmonary Disease

## 2018-03-28 DIAGNOSIS — N186 End stage renal disease: Secondary | ICD-10-CM | POA: Diagnosis not present

## 2018-03-28 DIAGNOSIS — E1122 Type 2 diabetes mellitus with diabetic chronic kidney disease: Secondary | ICD-10-CM | POA: Diagnosis not present

## 2018-03-28 DIAGNOSIS — N2581 Secondary hyperparathyroidism of renal origin: Secondary | ICD-10-CM | POA: Diagnosis not present

## 2018-03-28 DIAGNOSIS — D509 Iron deficiency anemia, unspecified: Secondary | ICD-10-CM | POA: Diagnosis not present

## 2018-03-28 DIAGNOSIS — D631 Anemia in chronic kidney disease: Secondary | ICD-10-CM | POA: Diagnosis not present

## 2018-03-28 DIAGNOSIS — Z23 Encounter for immunization: Secondary | ICD-10-CM | POA: Diagnosis not present

## 2018-03-28 NOTE — Telephone Encounter (Signed)
Attempted to call patient today regarding results. I did not receive an answer at time of call. I have left a voicemail message for pt to return call. X1  

## 2018-03-28 NOTE — Telephone Encounter (Signed)
Dg Chest 2 View  Result Date: 03/10/2018 CLINICAL DATA:  Pulmonary hypertension. Recently diagnosed with diastolic congestive heart failure. EXAM: CHEST - 2 VIEW COMPARISON:  01/16/2018. FINDINGS: Mild cardiac enlargement is similar to previous exam. Chronic pleural thickening versus small left pleural effusion noted. Pleuroparenchymal scarring overlying the lateral aspect of the left lower lung is unchanged from previous exam. No superimposed airspace consolidation identified. IMPRESSION: 1. Chronic pleuroparenchymal scarring overlying the left lower lung and small left pleural effusion versus chronic pleural thickening. Electronically Signed   By: Kerby Moors M.D.   On: 03/10/2018 13:01   [facility] Per & Vent  Result Date: 03/10/2018 CLINICAL DATA:  Primary pulmonary hypertension with shortness of breath. No chest pain. EXAM: NUCLEAR MEDICINE VENTILATION - PERFUSION LUNG SCAN TECHNIQUE: Ventilation images were obtained in multiple projections using inhaled aerosol Tc-75m DTPA. Perfusion images were obtained in multiple projections after intravenous injection of Tc-39m-MAA. RADIOPHARMACEUTICALS:  31.7 mCi of Tc-10m DTPA aerosol inhalation and 4.2 mCi Tc79m-MAA IV COMPARISON:  Chest radiograph from today. FINDINGS: Ventilation: There is heterogeneous distribution of the aerosolized radiotracer throughout both lungs. There is a large focus of relative increased uptake within the right lower lobe with relative decreased uptake within the right upper lobe and entire right lung. Several small foci of increased uptake within the central airways of the left lung identified. Perfusion: No wedge shaped peripheral perfusion defects to suggest acute pulmonary embolism. IMPRESSION: No findings to suggest acute or chronic pulmonary emboli. Heterogeneous distribution of the aerosolized radiopharmaceutical within both lungs is noted which may be seen with obstructive pulmonary disease. Electronically Signed   By: Kerby Moors M.D.   On: 03/10/2018 13:05    Please let him know that his xray did not show signs of clots in his lungs.    Please make sure he is scheduled for PFT and follow up visit with me or NP.

## 2018-03-28 NOTE — Telephone Encounter (Signed)
Patient's wife, Danae Chen, returning call (she is listed on DPR).  CB is [phone].

## 2018-03-28 NOTE — Telephone Encounter (Signed)
Called and spoke with pt and pt's wife Bryan Wilkerson regarding results & VS recommendations Advised that PFT still needs to be scheduled Scheduled PFT and f/u with EW on 04/07/18 at 11am Pt verbalized understanding, had no questions. Nothing further needed at this time.

## 2018-03-30 DIAGNOSIS — N2581 Secondary hyperparathyroidism of renal origin: Secondary | ICD-10-CM | POA: Diagnosis not present

## 2018-03-30 DIAGNOSIS — D631 Anemia in chronic kidney disease: Secondary | ICD-10-CM | POA: Diagnosis not present

## 2018-03-30 DIAGNOSIS — E1122 Type 2 diabetes mellitus with diabetic chronic kidney disease: Secondary | ICD-10-CM | POA: Diagnosis not present

## 2018-03-30 DIAGNOSIS — Z23 Encounter for immunization: Secondary | ICD-10-CM | POA: Diagnosis not present

## 2018-03-30 DIAGNOSIS — N186 End stage renal disease: Secondary | ICD-10-CM | POA: Diagnosis not present

## 2018-03-30 DIAGNOSIS — D509 Iron deficiency anemia, unspecified: Secondary | ICD-10-CM | POA: Diagnosis not present

## 2018-04-01 DIAGNOSIS — Z23 Encounter for immunization: Secondary | ICD-10-CM | POA: Diagnosis not present

## 2018-04-01 DIAGNOSIS — E1122 Type 2 diabetes mellitus with diabetic chronic kidney disease: Secondary | ICD-10-CM | POA: Diagnosis not present

## 2018-04-01 DIAGNOSIS — D509 Iron deficiency anemia, unspecified: Secondary | ICD-10-CM | POA: Diagnosis not present

## 2018-04-01 DIAGNOSIS — N186 End stage renal disease: Secondary | ICD-10-CM | POA: Diagnosis not present

## 2018-04-01 DIAGNOSIS — N2581 Secondary hyperparathyroidism of renal origin: Secondary | ICD-10-CM | POA: Diagnosis not present

## 2018-04-01 DIAGNOSIS — D631 Anemia in chronic kidney disease: Secondary | ICD-10-CM | POA: Diagnosis not present

## 2018-04-04 DIAGNOSIS — D509 Iron deficiency anemia, unspecified: Secondary | ICD-10-CM | POA: Diagnosis not present

## 2018-04-04 DIAGNOSIS — Z992 Dependence on renal dialysis: Secondary | ICD-10-CM | POA: Diagnosis not present

## 2018-04-04 DIAGNOSIS — E1129 Type 2 diabetes mellitus with other diabetic kidney complication: Secondary | ICD-10-CM | POA: Diagnosis not present

## 2018-04-04 DIAGNOSIS — D631 Anemia in chronic kidney disease: Secondary | ICD-10-CM | POA: Diagnosis not present

## 2018-04-04 DIAGNOSIS — E1122 Type 2 diabetes mellitus with diabetic chronic kidney disease: Secondary | ICD-10-CM | POA: Diagnosis not present

## 2018-04-04 DIAGNOSIS — N2581 Secondary hyperparathyroidism of renal origin: Secondary | ICD-10-CM | POA: Diagnosis not present

## 2018-04-04 DIAGNOSIS — N186 End stage renal disease: Secondary | ICD-10-CM | POA: Diagnosis not present

## 2018-04-06 DIAGNOSIS — D509 Iron deficiency anemia, unspecified: Secondary | ICD-10-CM | POA: Diagnosis not present

## 2018-04-06 DIAGNOSIS — N2581 Secondary hyperparathyroidism of renal origin: Secondary | ICD-10-CM | POA: Diagnosis not present

## 2018-04-06 DIAGNOSIS — N186 End stage renal disease: Secondary | ICD-10-CM | POA: Diagnosis not present

## 2018-04-06 DIAGNOSIS — D631 Anemia in chronic kidney disease: Secondary | ICD-10-CM | POA: Diagnosis not present

## 2018-04-06 DIAGNOSIS — E1122 Type 2 diabetes mellitus with diabetic chronic kidney disease: Secondary | ICD-10-CM | POA: Diagnosis not present

## 2018-04-07 ENCOUNTER — Other Ambulatory Visit (INDEPENDENT_AMBULATORY_CARE_PROVIDER_SITE_OTHER): Payer: Medicare Other

## 2018-04-07 ENCOUNTER — Telehealth: Payer: Self-pay | Admitting: *Deleted

## 2018-04-07 ENCOUNTER — Ambulatory Visit (INDEPENDENT_AMBULATORY_CARE_PROVIDER_SITE_OTHER): Payer: Medicare Other | Admitting: Primary Care

## 2018-04-07 ENCOUNTER — Encounter: Payer: Self-pay | Admitting: Primary Care

## 2018-04-07 ENCOUNTER — Ambulatory Visit (INDEPENDENT_AMBULATORY_CARE_PROVIDER_SITE_OTHER): Payer: Medicare Other | Admitting: Pulmonary Disease

## 2018-04-07 VITALS — BP 140/70 | HR 64 | Ht 72.0 in | Wt 178.0 lb

## 2018-04-07 DIAGNOSIS — J984 Other disorders of lung: Secondary | ICD-10-CM | POA: Diagnosis not present

## 2018-04-07 DIAGNOSIS — R569 Unspecified convulsions: Secondary | ICD-10-CM

## 2018-04-07 DIAGNOSIS — Z992 Dependence on renal dialysis: Secondary | ICD-10-CM | POA: Diagnosis not present

## 2018-04-07 DIAGNOSIS — I10 Essential (primary) hypertension: Secondary | ICD-10-CM

## 2018-04-07 DIAGNOSIS — I272 Pulmonary hypertension, unspecified: Secondary | ICD-10-CM

## 2018-04-07 DIAGNOSIS — Z7409 Other reduced mobility: Secondary | ICD-10-CM | POA: Diagnosis not present

## 2018-04-07 DIAGNOSIS — N186 End stage renal disease: Secondary | ICD-10-CM | POA: Diagnosis not present

## 2018-04-07 DIAGNOSIS — R0602 Shortness of breath: Secondary | ICD-10-CM | POA: Diagnosis not present

## 2018-04-07 LAB — CBC WITH DIFFERENTIAL/PLATELET
BASOS ABS: 0.1 10*3/uL (ref 0.0–0.1)
Basophils Relative: 0.9 % (ref 0.0–3.0)
EOS ABS: 1.9 10*3/uL — AB (ref 0.0–0.7)
Eosinophils Relative: 24.6 % — ABNORMAL HIGH (ref 0.0–5.0)
HCT: 33.7 % — ABNORMAL LOW (ref 39.0–52.0)
Hemoglobin: 11.1 g/dL — ABNORMAL LOW (ref 13.0–17.0)
LYMPHS ABS: 1.5 10*3/uL (ref 0.7–4.0)
Lymphocytes Relative: 19 % (ref 12.0–46.0)
MCHC: 32.9 g/dL (ref 30.0–36.0)
MCV: 94.6 fl (ref 78.0–100.0)
Monocytes Absolute: 0.7 10*3/uL (ref 0.1–1.0)
Monocytes Relative: 9.6 % (ref 3.0–12.0)
NEUTROS ABS: 3.5 10*3/uL (ref 1.4–7.7)
NEUTROS PCT: 45.9 % (ref 43.0–77.0)
PLATELETS: 156 10*3/uL (ref 150.0–400.0)
RBC: 3.56 Mil/uL — ABNORMAL LOW (ref 4.22–5.81)
RDW: 20.5 % — ABNORMAL HIGH (ref 11.5–15.5)
WBC: 7.7 10*3/uL (ref 4.0–10.5)

## 2018-04-07 LAB — BASIC METABOLIC PANEL
BUN: 33 mg/dL — ABNORMAL HIGH (ref 6–23)
CALCIUM: 10.3 mg/dL (ref 8.4–10.5)
CO2: 34 meq/L — AB (ref 19–32)
CREATININE: 8.37 mg/dL — AB (ref 0.40–1.50)
Chloride: 94 mEq/L — ABNORMAL LOW (ref 96–112)
GFR: 8.71 mL/min — CL (ref >60.00)
GLUCOSE: 158 mg/dL — AB (ref 70–99)
Potassium: 4.4 mEq/L (ref 3.5–5.1)
Sodium: 135 mEq/L (ref 135–145)

## 2018-04-07 LAB — PULMONARY FUNCTION TEST
DL/VA % pred: 60 %
DL/VA: 2.86 ml/min/mmHg/L
DLCO unc % pred: 25 %
DLCO unc: 8.88 ml/min/mmHg
FEF 25-75 Post: 2.39 L/sec
FEF 25-75 Pre: 1.95 L/sec
FEF2575-%Change-Post: 22 %
FEF2575-%PRED-PRE: 56 %
FEF2575-%Pred-Post: 68 %
FEV1-%Change-Post: 2 %
FEV1-%PRED-POST: 50 %
FEV1-%Pred-Pre: 49 %
FEV1-PRE: 1.75 L
FEV1-Post: 1.79 L
FEV1FVC-%Change-Post: 1 %
FEV1FVC-%Pred-Pre: 106 %
FEV6-%CHANGE-POST: 1 %
FEV6-%Pred-Post: 47 %
FEV6-%Pred-Pre: 47 %
FEV6-PRE: 2.06 L
FEV6-Post: 2.08 L
FEV6FVC-%PRED-POST: 103 %
FEV6FVC-%PRED-PRE: 103 %
FVC-%Change-Post: 1 %
FVC-%Pred-Post: 46 %
FVC-%Pred-Pre: 45 %
FVC-Post: 2.08 L
FVC-Pre: 2.06 L
POST FEV6/FVC RATIO: 100 %
Post FEV1/FVC ratio: 86 %
Pre FEV1/FVC ratio: 85 %
Pre FEV6/FVC Ratio: 100 %
RV % pred: 68 %
RV: 1.48 L
TLC % pred: 47 %
TLC: 3.51 L

## 2018-04-07 NOTE — Assessment & Plan Note (Addendum)
-  Follows with nephology in [place] -Continues HD tues/thur/sat

## 2018-04-07 NOTE — Assessment & Plan Note (Signed)
-   Echocardiogram in July showed pulmonary arterial pressure 69 mmHg - Repeat in 2 weeks to determine if PA pressures improve with supplemental oxygen

## 2018-04-07 NOTE — Telephone Encounter (Signed)
Patient scheduled for HD tomorrow. Last Cr in July 7

## 2018-04-07 NOTE — Assessment & Plan Note (Signed)
-   Improved; ambulating independency, not requiring WC - Could benefit from therapy

## 2018-04-07 NOTE — Assessment & Plan Note (Addendum)
-  Continues Keppra 500mg  daily and Vimpat 75mg  BID  -Reports seizure last week when not wearing oxygen

## 2018-04-07 NOTE — Assessment & Plan Note (Addendum)
-   Continues to require supplemental oxygen since July  - Feels well. Denies sob or wheezing  - O2 sat trial on RA was 88%; on 2L he was100% - VQ scan low probability PE - Sleep study showed no evidence of sleep apnea  - PAH and diastolic dysfunction likely contributing some - PFTs showed restriction, need HRCT - Labs today to monitor anemia

## 2018-04-07 NOTE — Assessment & Plan Note (Addendum)
-   PFTs 04/07/2018- showed severe restriction, low DLCO - FVC 2.08 (46%), FEV11.79 (50%), ratio 86, TLC 3.51 (47%), DLCOunc 8.88 (25%)  - Needs HRCT to r/o ILD, determine cause  - FU in 2-4 weeks with Dr. Halford Chessman

## 2018-04-07 NOTE — Progress Notes (Signed)
PFT done today. 

## 2018-04-07 NOTE — Telephone Encounter (Signed)
Received call report from Lab with critical value on patient's Cr 8.34 and GFR 8.75 done on 04/07/2018.   Beth Please advise, thank you.

## 2018-04-07 NOTE — Assessment & Plan Note (Signed)
-  Follows with Dr. Elmon [facility], per note in July appears compensated

## 2018-04-07 NOTE — Progress Notes (Signed)
Reviewed and agree with assessment/plan.   Hien Cunliffe, MD [facility] 06/30/2016, 12:24 PM Pager:  [phone]  

## 2018-04-07 NOTE — Assessment & Plan Note (Signed)
-  Stable; BP 140/70 -Continues Norvasc and Coreg

## 2018-04-07 NOTE — Progress Notes (Signed)
@Patient  ID: Bryan Wilkerson, male    DOB: Feb 24, 1966, 52 y.o.   MRN: [id number]  Chief Complaint  Patient presents with  . Follow-up    PFT results, using O2 since July, 3LNC,DME [facility]    Referring provider: [name], MD  HPI: 52 year old male, never smoked. PMH type 1 diabetes, end-stage renal disease (on HD), pulmonary hypertension, diastolic heart failure, pleural effusion, seizures. Patient of Dr. Halford Chessman, last seen on 03/02/18.  Needs PFTs, VQ scan, ECHO.   -Patient was being evaluated for renal transplant, on hold d/t oxygen needs and deconditioning  -ESRD follows with nephrology in [place] -Follow up with cardiology for management of diastolic heart failure, may need heart cath at some point to further assess -Repeat ECHO in next few months to determine if PA pressures improve with supplemental oxygen    04/07/2018 Patient presents today for follow-up visit with PFTs. He's feeling well. No shortness of breath at rest or with exertion. Denies cough, wheezing. Continues HD (tues/thur/sat). Follows with Dr. Jenne Campus with cardiology, last note reviewed from 07/23. Started using oxygen in June/July after seizure. On Keppra and Vimpat. Last seizure witnessed by significant other 1 week ago. States that he thought he turned his oxygen on but did not. Ambulating without assisted device, not requiring wheelchair. Thinks he could benefit from physical therapy. Has had some weight loss.    Oxygen testing: Oxygen at rest on RA was 88%; 2L 100%    Significant testing: > CXR 01/16/18- scar at left base > Echo 01/27/18 - EF 94-85%, grade 2 diastolic dysfunction, mild MR, and significant elevation in pulmonary pressures at 69 mmHg > PSG 02/17/18 - No evidence of sleep apnea/ AHI 0.8, SpO2 low 88% > CXR 03/10/18- Mild cardiac enlargement, similar to previous exam. Chronic pleural thickening vs small left pleural effusion. Pleuroparenchymal scarring overlying the lateral aspect  of the left lower lung.   > VQ scan  03/10/18- No findings to suggest acute or chronic pulmonary emboli  > PFTs 04/07/2018- showed severe restriction, low DLCO FVC 2.08 (46%), FEV11.79 (50%), ratio 86, TLC 3.51 (47%), DLCOunc 8.88 (25%)    Allergies  Allergen Reactions  . Penicillins Other (See Comments)    UNSPECIFIED REACTION FROM CHILDHOOD Has patient had a PCN reaction causing immediate rash, facial/tongue/throat swelling, SOB or lightheadedness with hypotension:Yes Has patient had a PCN reaction causing severe rash involving mucus membranes or skin necrosis:No Has patient had a PCN reaction that required hospitalization:Yes Has patient had a PCN reaction occurring within the last 10 years:No If all of the above answers are "NO", then may proceed with Cephalosporin use.      Immunization History  Administered Date(s) Administered  . Influenza Inj Mdck Quad Pf 03/23/2018  . PPD Test 05/23/2017, 05/23/2017  . Pneumococcal Polysaccharide-23 12/30/2012    Past Medical History:  Diagnosis Date  . Anemia   . CHF (congestive heart failure) ([place])   . Diabetic retinopathy ([place])   . ESRD on peritoneal dialysis ([place])    "7 days/week" (04/20/2017)  . Hypertension   . Pneumonia 2016; 04/19/2017  . Psoriasis   . Type I diabetes mellitus (HCC)     Tobacco History: Social History   Tobacco Use  Smoking Status Never Smoker  Smokeless Tobacco Never Used   Counseling given: Not Answered   Outpatient Medications Prior to Visit  Medication Sig Dispense Refill  . amLODipine (NORVASC) 5 MG tablet Take 5 mg by mouth daily.     [name]  calcitRIOL (ROCALTROL) 0.25 MCG capsule Take 2 capsules (0.5 mcg total) by mouth daily. (Patient taking differently: Take 0.25 mcg by mouth 3 (three) times daily. ) 30 capsule 0  . carvedilol (COREG) 6.25 MG tablet Take 1 tablet (6.25 mg total) by mouth 2 (two) times daily with a meal. 60 tablet 0  . Lacosamide 150 MG TABS Take 150 mg by mouth daily.     [name]  LEVEMIR FLEXTOUCH 100 UNIT/ML Pen Inject 2-8 Units into the skin daily at 10 pm. Sliding Scale    . NOVOLOG FLEXPEN 100 UNIT/ML FlexPen Sliding scale over  150-315 1 units    . OTEZLA 30 MG TABS Take 30 mg by mouth 2 (two) times daily.     . OXYGEN Inhale 3 L into the lungs continuous.    . sucroferric oxyhydroxide (VELPHORO) 500 MG chewable tablet Chew 500 mg by mouth daily.     [name] triamcinolone ointment (KENALOG) 0.1 % Apply 1 application topically as needed (psorasis).      No facility-administered medications prior to visit.     Review of Systems  Review of Systems  Constitutional: Positive for unexpected weight change.  HENT: Negative.   Respiratory: Negative for cough, shortness of breath and wheezing.   Cardiovascular: Negative.   Skin:       Psoriasis     Physical Exam  BP 140/70 (BP Location: Right Arm, Cuff Size: Normal)   Pulse 64   Ht 6' (1.829 m)   Wt 178 lb (80.7 kg)   SpO2 97%   BMI 24.14 kg/m  Physical Exam  Constitutional: He is oriented to person, place, and time. No distress.  Tall/thin adult male  HENT:  Head: Normocephalic and atraumatic.  Eyes: Pupils are equal, round, and reactive to light. EOM are normal.  Neck: Normal range of motion. Neck supple.  Cardiovascular: Normal rate and regular rhythm.  Pulmonary/Chest: Effort normal.  CTA, O2 sat 100% 2L   Musculoskeletal: Normal range of motion.  Ambulating  Neurological: He is alert and oriented to person, place, and time.  Skin: Skin is dry.  Psychiatric: He has a normal mood and affect. His behavior is normal. Judgment and thought content normal.     Lab Results:  CBC    Component Value Date/Time   WBC 5.5 08/27/2017 0501   RBC 3.57 (L) 08/27/2017 0501   HGB 9.7 (L) 08/27/2017 0501   HCT 31.5 (L) 08/27/2017 0501   PLT 154 08/27/2017 0501   MCV 88.2 08/27/2017 0501   MCH 27.2 08/27/2017 0501   MCHC 30.8 08/27/2017 0501   RDW 15.4 08/27/2017 0501   LYMPHSABS 1.8 08/25/2017 1407    MONOABS 0.6 08/25/2017 1407   EOSABS 0.3 08/25/2017 1407   BASOSABS 0.0 08/25/2017 1407    BMET    Component Value Date/Time   NA 134 (L) 08/27/2017 0501   K 2.9 (L) 08/27/2017 0501   CL 98 (L) 08/27/2017 0501   CO2 27 08/27/2017 0501   GLUCOSE 110 (H) 08/27/2017 0501   BUN 10 08/27/2017 0501   CREATININE 6.02 (H) 08/27/2017 0501   CREATININE 7.03 (H) 10/31/2013 1208   CALCIUM 7.4 (L) 08/27/2017 0501   CALCIUM 7.4 (L) 03/01/2014 0944   GFRNONAA 10 (L) 08/27/2017 0501   GFRAA 11 (L) 08/27/2017 0501    BNP    Component Value Date/Time   BNP 461.9 (H) 10/31/2013 1208    ProBNP    Component Value Date/Time   PROBNP 5,926.0 (H) 01/01/2013 0522  Imaging: Dg Chest 2 View  Result Date: 03/10/2018 CLINICAL DATA:  Pulmonary hypertension. Recently diagnosed with diastolic congestive heart failure. EXAM: CHEST - 2 VIEW COMPARISON:  01/16/2018. FINDINGS: Mild cardiac enlargement is similar to previous exam. Chronic pleural thickening versus small left pleural effusion noted. Pleuroparenchymal scarring overlying the lateral aspect of the left lower lung is unchanged from previous exam. No superimposed airspace consolidation identified. IMPRESSION: 1. Chronic pleuroparenchymal scarring overlying the left lower lung and small left pleural effusion versus chronic pleural thickening. Electronically Signed   By: Kerby Moors M.D.   On: 03/10/2018 13:01   Nm Pulmonary Per & Vent  Result Date: 03/10/2018 CLINICAL DATA:  Primary pulmonary hypertension with shortness of breath. No chest pain. EXAM: NUCLEAR MEDICINE VENTILATION - PERFUSION LUNG SCAN TECHNIQUE: Ventilation images were obtained in multiple projections using inhaled aerosol Tc-72m DTPA. Perfusion images were obtained in multiple projections after intravenous injection of Tc-81m-MAA. RADIOPHARMACEUTICALS:  31.7 mCi of Tc-10m DTPA aerosol inhalation and 4.2 mCi Tc54m-MAA IV COMPARISON:  Chest radiograph from today. FINDINGS:  Ventilation: There is heterogeneous distribution of the aerosolized radiotracer throughout both lungs. There is a large focus of relative increased uptake within the right lower lobe with relative decreased uptake within the right upper lobe and entire right lung. Several small foci of increased uptake within the central airways of the left lung identified. Perfusion: No wedge shaped peripheral perfusion defects to suggest acute pulmonary embolism. IMPRESSION: No findings to suggest acute or chronic pulmonary emboli. Heterogeneous distribution of the aerosolized radiopharmaceutical within both lungs is noted which may be seen with obstructive pulmonary disease. Electronically Signed   By: Kerby Moors M.D.   On: 03/10/2018 13:05     Assessment & Plan:   Impaired mobility and ADLs - Improved; ambulating independency, not requiring WC - Could benefit from therapy   ESRD on dialysis ([place]) -Follows with nephology in [place] -Continues HD tues/thur/sat  Seizure ([place]) -Continues Keppra 500mg  daily and Vimpat 75mg  BID  -Reports seizure last week when not wearing oxygen   Pulmonary hypertension, unspecified (HCC) - Echocardiogram in July showed pulmonary arterial pressure 69 mmHg - Repeat in 2 weeks to determine if PA pressures improve with supplemental oxygen   CHF (congestive heart failure), NYHA class II ([place]) -Follows with Dr. Elmon Kirsch[facility], per note in July appears compensated   Essential hypertension, benign -Stable; BP 140/70 -Continues Norvasc and Coreg   Shortness of breath - Continues to require supplemental oxygen since July  - Feels well. Denies sob or wheezing  - O2 sat trial on RA was 88%; on 2L he was100% - VQ scan low probability PE - Sleep study showed no evidence of sleep apnea  - PAH and diastolic dysfunction likely contributing some - PFTs showed restriction, need HRCT - Labs today to monitor anemia   Restrictive lung disease - PFTs 04/07/2018- showed  severe restriction, low DLCO - FVC 2.08 (46%), FEV11.79 (50%), ratio 86, TLC 3.51 (47%), DLCOunc 8.88 (25%)  - Needs HRCT to r/o ILD, determine cause  - FU in 2-4 weeks with Dr. Almedia Balls, NP 04/07/2018

## 2018-04-07 NOTE — Patient Instructions (Addendum)
Pulmonary function test today showed restriction, no obstruction   Keep O2 level >92% Use 2L at rest, 3L when ambulating   Repeat Echocardiogram in 2 weeks  Ordering a high resolution CT scan chest   Refer to cardiopulmonary rehab re: restrictive lung disease, pulm hypertension, dHF  FU with Dr. Halford Chessman in 2-4 weeks

## 2018-04-08 DIAGNOSIS — D509 Iron deficiency anemia, unspecified: Secondary | ICD-10-CM | POA: Diagnosis not present

## 2018-04-08 DIAGNOSIS — D631 Anemia in chronic kidney disease: Secondary | ICD-10-CM | POA: Diagnosis not present

## 2018-04-08 DIAGNOSIS — N186 End stage renal disease: Secondary | ICD-10-CM | POA: Diagnosis not present

## 2018-04-08 DIAGNOSIS — E1122 Type 2 diabetes mellitus with diabetic chronic kidney disease: Secondary | ICD-10-CM | POA: Diagnosis not present

## 2018-04-08 DIAGNOSIS — N2581 Secondary hyperparathyroidism of renal origin: Secondary | ICD-10-CM | POA: Diagnosis not present

## 2018-04-10 ENCOUNTER — Ambulatory Visit (HOSPITAL_BASED_OUTPATIENT_CLINIC_OR_DEPARTMENT_OTHER): Payer: Medicare Other

## 2018-04-10 ENCOUNTER — Ambulatory Visit: Payer: Medicare Other | Admitting: Cardiology

## 2018-04-11 DIAGNOSIS — N2581 Secondary hyperparathyroidism of renal origin: Secondary | ICD-10-CM | POA: Diagnosis not present

## 2018-04-11 DIAGNOSIS — E1122 Type 2 diabetes mellitus with diabetic chronic kidney disease: Secondary | ICD-10-CM | POA: Diagnosis not present

## 2018-04-11 DIAGNOSIS — D509 Iron deficiency anemia, unspecified: Secondary | ICD-10-CM | POA: Diagnosis not present

## 2018-04-11 DIAGNOSIS — D631 Anemia in chronic kidney disease: Secondary | ICD-10-CM | POA: Diagnosis not present

## 2018-04-11 DIAGNOSIS — N186 End stage renal disease: Secondary | ICD-10-CM | POA: Diagnosis not present

## 2018-04-11 NOTE — Addendum Note (Signed)
Addended by: Collier Salina on: 04/11/2018 09:53 AM   Modules accepted: Orders

## 2018-04-13 DIAGNOSIS — N2581 Secondary hyperparathyroidism of renal origin: Secondary | ICD-10-CM | POA: Diagnosis not present

## 2018-04-13 DIAGNOSIS — D631 Anemia in chronic kidney disease: Secondary | ICD-10-CM | POA: Diagnosis not present

## 2018-04-13 DIAGNOSIS — N186 End stage renal disease: Secondary | ICD-10-CM | POA: Diagnosis not present

## 2018-04-13 DIAGNOSIS — E1122 Type 2 diabetes mellitus with diabetic chronic kidney disease: Secondary | ICD-10-CM | POA: Diagnosis not present

## 2018-04-13 DIAGNOSIS — D509 Iron deficiency anemia, unspecified: Secondary | ICD-10-CM | POA: Diagnosis not present

## 2018-04-15 DIAGNOSIS — D509 Iron deficiency anemia, unspecified: Secondary | ICD-10-CM | POA: Diagnosis not present

## 2018-04-15 DIAGNOSIS — D631 Anemia in chronic kidney disease: Secondary | ICD-10-CM | POA: Diagnosis not present

## 2018-04-15 DIAGNOSIS — N2581 Secondary hyperparathyroidism of renal origin: Secondary | ICD-10-CM | POA: Diagnosis not present

## 2018-04-15 DIAGNOSIS — N186 End stage renal disease: Secondary | ICD-10-CM | POA: Diagnosis not present

## 2018-04-15 DIAGNOSIS — E1122 Type 2 diabetes mellitus with diabetic chronic kidney disease: Secondary | ICD-10-CM | POA: Diagnosis not present

## 2018-04-18 DIAGNOSIS — N186 End stage renal disease: Secondary | ICD-10-CM | POA: Diagnosis not present

## 2018-04-18 DIAGNOSIS — N2581 Secondary hyperparathyroidism of renal origin: Secondary | ICD-10-CM | POA: Diagnosis not present

## 2018-04-18 DIAGNOSIS — D509 Iron deficiency anemia, unspecified: Secondary | ICD-10-CM | POA: Diagnosis not present

## 2018-04-18 DIAGNOSIS — D631 Anemia in chronic kidney disease: Secondary | ICD-10-CM | POA: Diagnosis not present

## 2018-04-18 DIAGNOSIS — E1122 Type 2 diabetes mellitus with diabetic chronic kidney disease: Secondary | ICD-10-CM | POA: Diagnosis not present

## 2018-04-19 ENCOUNTER — Telehealth (HOSPITAL_COMMUNITY): Payer: Self-pay

## 2018-04-19 NOTE — Telephone Encounter (Signed)
Attempted to call patient in regards to Pulmonary Rehab - LM on VM °

## 2018-04-19 NOTE — Telephone Encounter (Signed)
Pt insurance is active and benefits verified through Medicare A/b. Co-pay $0.00, DED $185.00/$0.00 met, out of pocket $0.00/$0.00 met, co-insurance 20%. No pre-authorization.  2ndary insurance is active and benefits verified through [facility]. Co-pay $0.00,2 DED $0.00/$0.00 met, out of pocket $3,000.00/$3,000.00 met, co-insurance 0%. No pre-authorization. Ales/[employer], 04/19/18 @ 2:26PM, [id number]

## 2018-04-20 DIAGNOSIS — E1122 Type 2 diabetes mellitus with diabetic chronic kidney disease: Secondary | ICD-10-CM | POA: Diagnosis not present

## 2018-04-20 DIAGNOSIS — D509 Iron deficiency anemia, unspecified: Secondary | ICD-10-CM | POA: Diagnosis not present

## 2018-04-20 DIAGNOSIS — D631 Anemia in chronic kidney disease: Secondary | ICD-10-CM | POA: Diagnosis not present

## 2018-04-20 DIAGNOSIS — N186 End stage renal disease: Secondary | ICD-10-CM | POA: Diagnosis not present

## 2018-04-20 DIAGNOSIS — N2581 Secondary hyperparathyroidism of renal origin: Secondary | ICD-10-CM | POA: Diagnosis not present

## 2018-04-21 ENCOUNTER — Ambulatory Visit (HOSPITAL_BASED_OUTPATIENT_CLINIC_OR_DEPARTMENT_OTHER): Admission: RE | Admit: 2018-04-21 | Payer: Medicare Other | Source: Ambulatory Visit

## 2018-04-22 DIAGNOSIS — N186 End stage renal disease: Secondary | ICD-10-CM | POA: Diagnosis not present

## 2018-04-22 DIAGNOSIS — D631 Anemia in chronic kidney disease: Secondary | ICD-10-CM | POA: Diagnosis not present

## 2018-04-22 DIAGNOSIS — D509 Iron deficiency anemia, unspecified: Secondary | ICD-10-CM | POA: Diagnosis not present

## 2018-04-22 DIAGNOSIS — N2581 Secondary hyperparathyroidism of renal origin: Secondary | ICD-10-CM | POA: Diagnosis not present

## 2018-04-22 DIAGNOSIS — E1122 Type 2 diabetes mellitus with diabetic chronic kidney disease: Secondary | ICD-10-CM | POA: Diagnosis not present

## 2018-04-25 ENCOUNTER — Ambulatory Visit: Payer: Medicare Other | Admitting: Cardiology

## 2018-04-25 DIAGNOSIS — D509 Iron deficiency anemia, unspecified: Secondary | ICD-10-CM | POA: Diagnosis not present

## 2018-04-25 DIAGNOSIS — D631 Anemia in chronic kidney disease: Secondary | ICD-10-CM | POA: Diagnosis not present

## 2018-04-25 DIAGNOSIS — R0989 Other specified symptoms and signs involving the circulatory and respiratory systems: Secondary | ICD-10-CM

## 2018-04-25 DIAGNOSIS — E1122 Type 2 diabetes mellitus with diabetic chronic kidney disease: Secondary | ICD-10-CM | POA: Diagnosis not present

## 2018-04-25 DIAGNOSIS — N2581 Secondary hyperparathyroidism of renal origin: Secondary | ICD-10-CM | POA: Diagnosis not present

## 2018-04-25 DIAGNOSIS — N186 End stage renal disease: Secondary | ICD-10-CM | POA: Diagnosis not present

## 2018-04-26 ENCOUNTER — Telehealth (HOSPITAL_COMMUNITY): Payer: Self-pay

## 2018-04-26 ENCOUNTER — Encounter: Payer: Self-pay | Admitting: Cardiology

## 2018-04-26 NOTE — Telephone Encounter (Signed)
2nd attempt to contact pt in regards to Pulmonary Rehab - lm on vm. Sending letter.

## 2018-04-27 DIAGNOSIS — E1122 Type 2 diabetes mellitus with diabetic chronic kidney disease: Secondary | ICD-10-CM | POA: Diagnosis not present

## 2018-04-27 DIAGNOSIS — N2581 Secondary hyperparathyroidism of renal origin: Secondary | ICD-10-CM | POA: Diagnosis not present

## 2018-04-27 DIAGNOSIS — N186 End stage renal disease: Secondary | ICD-10-CM | POA: Diagnosis not present

## 2018-04-27 DIAGNOSIS — D631 Anemia in chronic kidney disease: Secondary | ICD-10-CM | POA: Diagnosis not present

## 2018-04-27 DIAGNOSIS — D509 Iron deficiency anemia, unspecified: Secondary | ICD-10-CM | POA: Diagnosis not present

## 2018-04-29 DIAGNOSIS — D509 Iron deficiency anemia, unspecified: Secondary | ICD-10-CM | POA: Diagnosis not present

## 2018-04-29 DIAGNOSIS — E1122 Type 2 diabetes mellitus with diabetic chronic kidney disease: Secondary | ICD-10-CM | POA: Diagnosis not present

## 2018-04-29 DIAGNOSIS — N186 End stage renal disease: Secondary | ICD-10-CM | POA: Diagnosis not present

## 2018-04-29 DIAGNOSIS — D631 Anemia in chronic kidney disease: Secondary | ICD-10-CM | POA: Diagnosis not present

## 2018-04-29 DIAGNOSIS — N2581 Secondary hyperparathyroidism of renal origin: Secondary | ICD-10-CM | POA: Diagnosis not present

## 2018-05-02 DIAGNOSIS — N2581 Secondary hyperparathyroidism of renal origin: Secondary | ICD-10-CM | POA: Diagnosis not present

## 2018-05-02 DIAGNOSIS — D631 Anemia in chronic kidney disease: Secondary | ICD-10-CM | POA: Diagnosis not present

## 2018-05-02 DIAGNOSIS — E1122 Type 2 diabetes mellitus with diabetic chronic kidney disease: Secondary | ICD-10-CM | POA: Diagnosis not present

## 2018-05-02 DIAGNOSIS — N186 End stage renal disease: Secondary | ICD-10-CM | POA: Diagnosis not present

## 2018-05-02 DIAGNOSIS — D509 Iron deficiency anemia, unspecified: Secondary | ICD-10-CM | POA: Diagnosis not present

## 2018-05-02 NOTE — Telephone Encounter (Signed)
3rd attempt to contact pt in regards to Pulmonary Rehab. LM on VM

## 2018-05-04 DIAGNOSIS — E1122 Type 2 diabetes mellitus with diabetic chronic kidney disease: Secondary | ICD-10-CM | POA: Diagnosis not present

## 2018-05-04 DIAGNOSIS — N2581 Secondary hyperparathyroidism of renal origin: Secondary | ICD-10-CM | POA: Diagnosis not present

## 2018-05-04 DIAGNOSIS — D509 Iron deficiency anemia, unspecified: Secondary | ICD-10-CM | POA: Diagnosis not present

## 2018-05-04 DIAGNOSIS — N186 End stage renal disease: Secondary | ICD-10-CM | POA: Diagnosis not present

## 2018-05-04 DIAGNOSIS — D631 Anemia in chronic kidney disease: Secondary | ICD-10-CM | POA: Diagnosis not present

## 2018-05-05 ENCOUNTER — Ambulatory Visit: Payer: Medicare Other | Admitting: Pulmonary Disease

## 2018-05-05 DIAGNOSIS — Z992 Dependence on renal dialysis: Secondary | ICD-10-CM | POA: Diagnosis not present

## 2018-05-05 DIAGNOSIS — N186 End stage renal disease: Secondary | ICD-10-CM | POA: Diagnosis not present

## 2018-05-05 DIAGNOSIS — E1129 Type 2 diabetes mellitus with other diabetic kidney complication: Secondary | ICD-10-CM | POA: Diagnosis not present

## 2018-05-06 DIAGNOSIS — D631 Anemia in chronic kidney disease: Secondary | ICD-10-CM | POA: Diagnosis not present

## 2018-05-06 DIAGNOSIS — E1122 Type 2 diabetes mellitus with diabetic chronic kidney disease: Secondary | ICD-10-CM | POA: Diagnosis not present

## 2018-05-06 DIAGNOSIS — N186 End stage renal disease: Secondary | ICD-10-CM | POA: Diagnosis not present

## 2018-05-06 DIAGNOSIS — N2581 Secondary hyperparathyroidism of renal origin: Secondary | ICD-10-CM | POA: Diagnosis not present

## 2018-05-06 DIAGNOSIS — D509 Iron deficiency anemia, unspecified: Secondary | ICD-10-CM | POA: Diagnosis not present

## 2018-05-09 DIAGNOSIS — D509 Iron deficiency anemia, unspecified: Secondary | ICD-10-CM | POA: Diagnosis not present

## 2018-05-09 DIAGNOSIS — N186 End stage renal disease: Secondary | ICD-10-CM | POA: Diagnosis not present

## 2018-05-09 DIAGNOSIS — D631 Anemia in chronic kidney disease: Secondary | ICD-10-CM | POA: Diagnosis not present

## 2018-05-09 DIAGNOSIS — N2581 Secondary hyperparathyroidism of renal origin: Secondary | ICD-10-CM | POA: Diagnosis not present

## 2018-05-09 DIAGNOSIS — E1122 Type 2 diabetes mellitus with diabetic chronic kidney disease: Secondary | ICD-10-CM | POA: Diagnosis not present

## 2018-05-11 DIAGNOSIS — N2581 Secondary hyperparathyroidism of renal origin: Secondary | ICD-10-CM | POA: Diagnosis not present

## 2018-05-11 DIAGNOSIS — E1122 Type 2 diabetes mellitus with diabetic chronic kidney disease: Secondary | ICD-10-CM | POA: Diagnosis not present

## 2018-05-11 DIAGNOSIS — D631 Anemia in chronic kidney disease: Secondary | ICD-10-CM | POA: Diagnosis not present

## 2018-05-11 DIAGNOSIS — N186 End stage renal disease: Secondary | ICD-10-CM | POA: Diagnosis not present

## 2018-05-11 DIAGNOSIS — D509 Iron deficiency anemia, unspecified: Secondary | ICD-10-CM | POA: Diagnosis not present

## 2018-05-13 DIAGNOSIS — D631 Anemia in chronic kidney disease: Secondary | ICD-10-CM | POA: Diagnosis not present

## 2018-05-13 DIAGNOSIS — D509 Iron deficiency anemia, unspecified: Secondary | ICD-10-CM | POA: Diagnosis not present

## 2018-05-13 DIAGNOSIS — N186 End stage renal disease: Secondary | ICD-10-CM | POA: Diagnosis not present

## 2018-05-13 DIAGNOSIS — E1122 Type 2 diabetes mellitus with diabetic chronic kidney disease: Secondary | ICD-10-CM | POA: Diagnosis not present

## 2018-05-13 DIAGNOSIS — N2581 Secondary hyperparathyroidism of renal origin: Secondary | ICD-10-CM | POA: Diagnosis not present

## 2018-05-16 ENCOUNTER — Ambulatory Visit (HOSPITAL_BASED_OUTPATIENT_CLINIC_OR_DEPARTMENT_OTHER): Payer: Medicare Other

## 2018-05-16 DIAGNOSIS — D631 Anemia in chronic kidney disease: Secondary | ICD-10-CM | POA: Diagnosis not present

## 2018-05-16 DIAGNOSIS — D509 Iron deficiency anemia, unspecified: Secondary | ICD-10-CM | POA: Diagnosis not present

## 2018-05-16 DIAGNOSIS — N2581 Secondary hyperparathyroidism of renal origin: Secondary | ICD-10-CM | POA: Diagnosis not present

## 2018-05-16 DIAGNOSIS — E1122 Type 2 diabetes mellitus with diabetic chronic kidney disease: Secondary | ICD-10-CM | POA: Diagnosis not present

## 2018-05-16 DIAGNOSIS — N186 End stage renal disease: Secondary | ICD-10-CM | POA: Diagnosis not present

## 2018-05-18 DIAGNOSIS — N186 End stage renal disease: Secondary | ICD-10-CM | POA: Diagnosis not present

## 2018-05-18 DIAGNOSIS — E1122 Type 2 diabetes mellitus with diabetic chronic kidney disease: Secondary | ICD-10-CM | POA: Diagnosis not present

## 2018-05-18 DIAGNOSIS — N2581 Secondary hyperparathyroidism of renal origin: Secondary | ICD-10-CM | POA: Diagnosis not present

## 2018-05-18 DIAGNOSIS — D631 Anemia in chronic kidney disease: Secondary | ICD-10-CM | POA: Diagnosis not present

## 2018-05-18 DIAGNOSIS — D509 Iron deficiency anemia, unspecified: Secondary | ICD-10-CM | POA: Diagnosis not present

## 2018-05-20 DIAGNOSIS — D631 Anemia in chronic kidney disease: Secondary | ICD-10-CM | POA: Diagnosis not present

## 2018-05-20 DIAGNOSIS — D509 Iron deficiency anemia, unspecified: Secondary | ICD-10-CM | POA: Diagnosis not present

## 2018-05-20 DIAGNOSIS — E1122 Type 2 diabetes mellitus with diabetic chronic kidney disease: Secondary | ICD-10-CM | POA: Diagnosis not present

## 2018-05-20 DIAGNOSIS — N186 End stage renal disease: Secondary | ICD-10-CM | POA: Diagnosis not present

## 2018-05-20 DIAGNOSIS — N2581 Secondary hyperparathyroidism of renal origin: Secondary | ICD-10-CM | POA: Diagnosis not present

## 2018-05-23 DIAGNOSIS — N186 End stage renal disease: Secondary | ICD-10-CM | POA: Diagnosis not present

## 2018-05-23 DIAGNOSIS — E1122 Type 2 diabetes mellitus with diabetic chronic kidney disease: Secondary | ICD-10-CM | POA: Diagnosis not present

## 2018-05-23 DIAGNOSIS — D509 Iron deficiency anemia, unspecified: Secondary | ICD-10-CM | POA: Diagnosis not present

## 2018-05-23 DIAGNOSIS — N2581 Secondary hyperparathyroidism of renal origin: Secondary | ICD-10-CM | POA: Diagnosis not present

## 2018-05-23 DIAGNOSIS — D631 Anemia in chronic kidney disease: Secondary | ICD-10-CM | POA: Diagnosis not present

## 2018-05-25 DIAGNOSIS — N2581 Secondary hyperparathyroidism of renal origin: Secondary | ICD-10-CM | POA: Diagnosis not present

## 2018-05-25 DIAGNOSIS — D631 Anemia in chronic kidney disease: Secondary | ICD-10-CM | POA: Diagnosis not present

## 2018-05-25 DIAGNOSIS — D509 Iron deficiency anemia, unspecified: Secondary | ICD-10-CM | POA: Diagnosis not present

## 2018-05-25 DIAGNOSIS — E1122 Type 2 diabetes mellitus with diabetic chronic kidney disease: Secondary | ICD-10-CM | POA: Diagnosis not present

## 2018-05-25 DIAGNOSIS — N186 End stage renal disease: Secondary | ICD-10-CM | POA: Diagnosis not present

## 2018-05-27 DIAGNOSIS — D509 Iron deficiency anemia, unspecified: Secondary | ICD-10-CM | POA: Diagnosis not present

## 2018-05-27 DIAGNOSIS — N186 End stage renal disease: Secondary | ICD-10-CM | POA: Diagnosis not present

## 2018-05-27 DIAGNOSIS — E1122 Type 2 diabetes mellitus with diabetic chronic kidney disease: Secondary | ICD-10-CM | POA: Diagnosis not present

## 2018-05-27 DIAGNOSIS — N2581 Secondary hyperparathyroidism of renal origin: Secondary | ICD-10-CM | POA: Diagnosis not present

## 2018-05-27 DIAGNOSIS — D631 Anemia in chronic kidney disease: Secondary | ICD-10-CM | POA: Diagnosis not present

## 2018-05-29 DIAGNOSIS — D509 Iron deficiency anemia, unspecified: Secondary | ICD-10-CM | POA: Diagnosis not present

## 2018-05-29 DIAGNOSIS — E1122 Type 2 diabetes mellitus with diabetic chronic kidney disease: Secondary | ICD-10-CM | POA: Diagnosis not present

## 2018-05-29 DIAGNOSIS — N186 End stage renal disease: Secondary | ICD-10-CM | POA: Diagnosis not present

## 2018-05-29 DIAGNOSIS — D631 Anemia in chronic kidney disease: Secondary | ICD-10-CM | POA: Diagnosis not present

## 2018-05-29 DIAGNOSIS — N2581 Secondary hyperparathyroidism of renal origin: Secondary | ICD-10-CM | POA: Diagnosis not present

## 2018-05-31 DIAGNOSIS — D631 Anemia in chronic kidney disease: Secondary | ICD-10-CM | POA: Diagnosis not present

## 2018-05-31 DIAGNOSIS — D509 Iron deficiency anemia, unspecified: Secondary | ICD-10-CM | POA: Diagnosis not present

## 2018-05-31 DIAGNOSIS — N2581 Secondary hyperparathyroidism of renal origin: Secondary | ICD-10-CM | POA: Diagnosis not present

## 2018-05-31 DIAGNOSIS — N186 End stage renal disease: Secondary | ICD-10-CM | POA: Diagnosis not present

## 2018-05-31 DIAGNOSIS — E1122 Type 2 diabetes mellitus with diabetic chronic kidney disease: Secondary | ICD-10-CM | POA: Diagnosis not present

## 2018-06-03 DIAGNOSIS — D631 Anemia in chronic kidney disease: Secondary | ICD-10-CM | POA: Diagnosis not present

## 2018-06-03 DIAGNOSIS — E1122 Type 2 diabetes mellitus with diabetic chronic kidney disease: Secondary | ICD-10-CM | POA: Diagnosis not present

## 2018-06-03 DIAGNOSIS — D509 Iron deficiency anemia, unspecified: Secondary | ICD-10-CM | POA: Diagnosis not present

## 2018-06-03 DIAGNOSIS — N2581 Secondary hyperparathyroidism of renal origin: Secondary | ICD-10-CM | POA: Diagnosis not present

## 2018-06-03 DIAGNOSIS — N186 End stage renal disease: Secondary | ICD-10-CM | POA: Diagnosis not present

## 2018-06-04 DIAGNOSIS — Z992 Dependence on renal dialysis: Secondary | ICD-10-CM | POA: Diagnosis not present

## 2018-06-04 DIAGNOSIS — E1129 Type 2 diabetes mellitus with other diabetic kidney complication: Secondary | ICD-10-CM | POA: Diagnosis not present

## 2018-06-04 DIAGNOSIS — N186 End stage renal disease: Secondary | ICD-10-CM | POA: Diagnosis not present

## 2018-06-05 DIAGNOSIS — R0602 Shortness of breath: Secondary | ICD-10-CM | POA: Diagnosis not present

## 2018-06-05 DIAGNOSIS — Z992 Dependence on renal dialysis: Secondary | ICD-10-CM | POA: Diagnosis not present

## 2018-06-05 DIAGNOSIS — N186 End stage renal disease: Secondary | ICD-10-CM | POA: Diagnosis not present

## 2018-06-05 DIAGNOSIS — Z79899 Other long term (current) drug therapy: Secondary | ICD-10-CM | POA: Diagnosis not present

## 2018-06-05 DIAGNOSIS — I270 Primary pulmonary hypertension: Secondary | ICD-10-CM | POA: Diagnosis not present

## 2018-06-05 DIAGNOSIS — Z114 Encounter for screening for human immunodeficiency virus [HIV]: Secondary | ICD-10-CM | POA: Diagnosis not present

## 2018-06-06 DIAGNOSIS — E875 Hyperkalemia: Secondary | ICD-10-CM | POA: Diagnosis not present

## 2018-06-06 DIAGNOSIS — N186 End stage renal disease: Secondary | ICD-10-CM | POA: Diagnosis not present

## 2018-06-06 DIAGNOSIS — E1122 Type 2 diabetes mellitus with diabetic chronic kidney disease: Secondary | ICD-10-CM | POA: Diagnosis not present

## 2018-06-06 DIAGNOSIS — N2581 Secondary hyperparathyroidism of renal origin: Secondary | ICD-10-CM | POA: Diagnosis not present

## 2018-06-06 DIAGNOSIS — D509 Iron deficiency anemia, unspecified: Secondary | ICD-10-CM | POA: Diagnosis not present

## 2018-06-08 DIAGNOSIS — E875 Hyperkalemia: Secondary | ICD-10-CM | POA: Diagnosis not present

## 2018-06-08 DIAGNOSIS — D509 Iron deficiency anemia, unspecified: Secondary | ICD-10-CM | POA: Diagnosis not present

## 2018-06-08 DIAGNOSIS — N2581 Secondary hyperparathyroidism of renal origin: Secondary | ICD-10-CM | POA: Diagnosis not present

## 2018-06-08 DIAGNOSIS — E1122 Type 2 diabetes mellitus with diabetic chronic kidney disease: Secondary | ICD-10-CM | POA: Diagnosis not present

## 2018-06-08 DIAGNOSIS — N186 End stage renal disease: Secondary | ICD-10-CM | POA: Diagnosis not present

## 2018-06-10 DIAGNOSIS — E1122 Type 2 diabetes mellitus with diabetic chronic kidney disease: Secondary | ICD-10-CM | POA: Diagnosis not present

## 2018-06-10 DIAGNOSIS — E875 Hyperkalemia: Secondary | ICD-10-CM | POA: Diagnosis not present

## 2018-06-10 DIAGNOSIS — D509 Iron deficiency anemia, unspecified: Secondary | ICD-10-CM | POA: Diagnosis not present

## 2018-06-10 DIAGNOSIS — N186 End stage renal disease: Secondary | ICD-10-CM | POA: Diagnosis not present

## 2018-06-10 DIAGNOSIS — N2581 Secondary hyperparathyroidism of renal origin: Secondary | ICD-10-CM | POA: Diagnosis not present

## 2018-06-13 DIAGNOSIS — N186 End stage renal disease: Secondary | ICD-10-CM | POA: Diagnosis not present

## 2018-06-13 DIAGNOSIS — N2581 Secondary hyperparathyroidism of renal origin: Secondary | ICD-10-CM | POA: Diagnosis not present

## 2018-06-13 DIAGNOSIS — D509 Iron deficiency anemia, unspecified: Secondary | ICD-10-CM | POA: Diagnosis not present

## 2018-06-13 DIAGNOSIS — E1122 Type 2 diabetes mellitus with diabetic chronic kidney disease: Secondary | ICD-10-CM | POA: Diagnosis not present

## 2018-06-13 DIAGNOSIS — E875 Hyperkalemia: Secondary | ICD-10-CM | POA: Diagnosis not present

## 2018-06-15 DIAGNOSIS — E1122 Type 2 diabetes mellitus with diabetic chronic kidney disease: Secondary | ICD-10-CM | POA: Diagnosis not present

## 2018-06-15 DIAGNOSIS — N2581 Secondary hyperparathyroidism of renal origin: Secondary | ICD-10-CM | POA: Diagnosis not present

## 2018-06-15 DIAGNOSIS — E875 Hyperkalemia: Secondary | ICD-10-CM | POA: Diagnosis not present

## 2018-06-15 DIAGNOSIS — N186 End stage renal disease: Secondary | ICD-10-CM | POA: Diagnosis not present

## 2018-06-15 DIAGNOSIS — D509 Iron deficiency anemia, unspecified: Secondary | ICD-10-CM | POA: Diagnosis not present

## 2018-06-17 DIAGNOSIS — E1122 Type 2 diabetes mellitus with diabetic chronic kidney disease: Secondary | ICD-10-CM | POA: Diagnosis not present

## 2018-06-17 DIAGNOSIS — N2581 Secondary hyperparathyroidism of renal origin: Secondary | ICD-10-CM | POA: Diagnosis not present

## 2018-06-17 DIAGNOSIS — E875 Hyperkalemia: Secondary | ICD-10-CM | POA: Diagnosis not present

## 2018-06-17 DIAGNOSIS — D509 Iron deficiency anemia, unspecified: Secondary | ICD-10-CM | POA: Diagnosis not present

## 2018-06-17 DIAGNOSIS — N186 End stage renal disease: Secondary | ICD-10-CM | POA: Diagnosis not present

## 2018-06-20 DIAGNOSIS — D509 Iron deficiency anemia, unspecified: Secondary | ICD-10-CM | POA: Diagnosis not present

## 2018-06-20 DIAGNOSIS — E1122 Type 2 diabetes mellitus with diabetic chronic kidney disease: Secondary | ICD-10-CM | POA: Diagnosis not present

## 2018-06-20 DIAGNOSIS — N186 End stage renal disease: Secondary | ICD-10-CM | POA: Diagnosis not present

## 2018-06-20 DIAGNOSIS — E875 Hyperkalemia: Secondary | ICD-10-CM | POA: Diagnosis not present

## 2018-06-20 DIAGNOSIS — N2581 Secondary hyperparathyroidism of renal origin: Secondary | ICD-10-CM | POA: Diagnosis not present

## 2018-06-22 DIAGNOSIS — E1122 Type 2 diabetes mellitus with diabetic chronic kidney disease: Secondary | ICD-10-CM | POA: Diagnosis not present

## 2018-06-22 DIAGNOSIS — E875 Hyperkalemia: Secondary | ICD-10-CM | POA: Diagnosis not present

## 2018-06-22 DIAGNOSIS — N2581 Secondary hyperparathyroidism of renal origin: Secondary | ICD-10-CM | POA: Diagnosis not present

## 2018-06-22 DIAGNOSIS — N186 End stage renal disease: Secondary | ICD-10-CM | POA: Diagnosis not present

## 2018-06-22 DIAGNOSIS — D509 Iron deficiency anemia, unspecified: Secondary | ICD-10-CM | POA: Diagnosis not present

## 2018-06-23 DIAGNOSIS — J984 Other disorders of lung: Secondary | ICD-10-CM | POA: Diagnosis not present

## 2018-06-23 DIAGNOSIS — N186 End stage renal disease: Secondary | ICD-10-CM | POA: Diagnosis not present

## 2018-06-23 DIAGNOSIS — I517 Cardiomegaly: Secondary | ICD-10-CM | POA: Diagnosis not present

## 2018-06-23 DIAGNOSIS — I5042 Chronic combined systolic (congestive) and diastolic (congestive) heart failure: Secondary | ICD-10-CM | POA: Diagnosis not present

## 2018-06-23 DIAGNOSIS — I081 Rheumatic disorders of both mitral and tricuspid valves: Secondary | ICD-10-CM | POA: Diagnosis not present

## 2018-06-23 DIAGNOSIS — I270 Primary pulmonary hypertension: Secondary | ICD-10-CM | POA: Diagnosis not present

## 2018-06-24 DIAGNOSIS — E1122 Type 2 diabetes mellitus with diabetic chronic kidney disease: Secondary | ICD-10-CM | POA: Diagnosis not present

## 2018-06-24 DIAGNOSIS — E875 Hyperkalemia: Secondary | ICD-10-CM | POA: Diagnosis not present

## 2018-06-24 DIAGNOSIS — D509 Iron deficiency anemia, unspecified: Secondary | ICD-10-CM | POA: Diagnosis not present

## 2018-06-24 DIAGNOSIS — N186 End stage renal disease: Secondary | ICD-10-CM | POA: Diagnosis not present

## 2018-06-24 DIAGNOSIS — N2581 Secondary hyperparathyroidism of renal origin: Secondary | ICD-10-CM | POA: Diagnosis not present

## 2018-06-26 DIAGNOSIS — E875 Hyperkalemia: Secondary | ICD-10-CM | POA: Diagnosis not present

## 2018-06-26 DIAGNOSIS — N2581 Secondary hyperparathyroidism of renal origin: Secondary | ICD-10-CM | POA: Diagnosis not present

## 2018-06-26 DIAGNOSIS — E1122 Type 2 diabetes mellitus with diabetic chronic kidney disease: Secondary | ICD-10-CM | POA: Diagnosis not present

## 2018-06-26 DIAGNOSIS — D509 Iron deficiency anemia, unspecified: Secondary | ICD-10-CM | POA: Diagnosis not present

## 2018-06-26 DIAGNOSIS — N186 End stage renal disease: Secondary | ICD-10-CM | POA: Diagnosis not present

## 2018-06-29 DIAGNOSIS — N186 End stage renal disease: Secondary | ICD-10-CM | POA: Diagnosis not present

## 2018-06-29 DIAGNOSIS — D509 Iron deficiency anemia, unspecified: Secondary | ICD-10-CM | POA: Diagnosis not present

## 2018-06-29 DIAGNOSIS — E875 Hyperkalemia: Secondary | ICD-10-CM | POA: Diagnosis not present

## 2018-06-29 DIAGNOSIS — E1122 Type 2 diabetes mellitus with diabetic chronic kidney disease: Secondary | ICD-10-CM | POA: Diagnosis not present

## 2018-06-29 DIAGNOSIS — N2581 Secondary hyperparathyroidism of renal origin: Secondary | ICD-10-CM | POA: Diagnosis not present

## 2018-07-02 DIAGNOSIS — N186 End stage renal disease: Secondary | ICD-10-CM | POA: Diagnosis not present

## 2018-07-02 DIAGNOSIS — D509 Iron deficiency anemia, unspecified: Secondary | ICD-10-CM | POA: Diagnosis not present

## 2018-07-02 DIAGNOSIS — N2581 Secondary hyperparathyroidism of renal origin: Secondary | ICD-10-CM | POA: Diagnosis not present

## 2018-07-02 DIAGNOSIS — E875 Hyperkalemia: Secondary | ICD-10-CM | POA: Diagnosis not present

## 2018-07-02 DIAGNOSIS — E1122 Type 2 diabetes mellitus with diabetic chronic kidney disease: Secondary | ICD-10-CM | POA: Diagnosis not present

## 2018-07-03 DIAGNOSIS — D509 Iron deficiency anemia, unspecified: Secondary | ICD-10-CM | POA: Diagnosis not present

## 2018-07-03 DIAGNOSIS — E1122 Type 2 diabetes mellitus with diabetic chronic kidney disease: Secondary | ICD-10-CM | POA: Diagnosis not present

## 2018-07-03 DIAGNOSIS — N2581 Secondary hyperparathyroidism of renal origin: Secondary | ICD-10-CM | POA: Diagnosis not present

## 2018-07-03 DIAGNOSIS — N186 End stage renal disease: Secondary | ICD-10-CM | POA: Diagnosis not present

## 2018-07-03 DIAGNOSIS — E875 Hyperkalemia: Secondary | ICD-10-CM | POA: Diagnosis not present

## 2018-07-05 DIAGNOSIS — N186 End stage renal disease: Secondary | ICD-10-CM | POA: Diagnosis not present

## 2018-07-05 DIAGNOSIS — E1129 Type 2 diabetes mellitus with other diabetic kidney complication: Secondary | ICD-10-CM | POA: Diagnosis not present

## 2018-07-05 DIAGNOSIS — Z992 Dependence on renal dialysis: Secondary | ICD-10-CM | POA: Diagnosis not present

## 2018-07-06 DIAGNOSIS — N2581 Secondary hyperparathyroidism of renal origin: Secondary | ICD-10-CM | POA: Diagnosis not present

## 2018-07-06 DIAGNOSIS — D509 Iron deficiency anemia, unspecified: Secondary | ICD-10-CM | POA: Diagnosis not present

## 2018-07-06 DIAGNOSIS — E1122 Type 2 diabetes mellitus with diabetic chronic kidney disease: Secondary | ICD-10-CM | POA: Diagnosis not present

## 2018-07-06 DIAGNOSIS — N186 End stage renal disease: Secondary | ICD-10-CM | POA: Diagnosis not present

## 2018-07-06 DIAGNOSIS — E875 Hyperkalemia: Secondary | ICD-10-CM | POA: Diagnosis not present

## 2018-07-08 DIAGNOSIS — E1122 Type 2 diabetes mellitus with diabetic chronic kidney disease: Secondary | ICD-10-CM | POA: Diagnosis not present

## 2018-07-08 DIAGNOSIS — N2581 Secondary hyperparathyroidism of renal origin: Secondary | ICD-10-CM | POA: Diagnosis not present

## 2018-07-08 DIAGNOSIS — N186 End stage renal disease: Secondary | ICD-10-CM | POA: Diagnosis not present

## 2018-07-08 DIAGNOSIS — E875 Hyperkalemia: Secondary | ICD-10-CM | POA: Diagnosis not present

## 2018-07-08 DIAGNOSIS — D509 Iron deficiency anemia, unspecified: Secondary | ICD-10-CM | POA: Diagnosis not present

## 2018-07-11 DIAGNOSIS — E1122 Type 2 diabetes mellitus with diabetic chronic kidney disease: Secondary | ICD-10-CM | POA: Diagnosis not present

## 2018-07-11 DIAGNOSIS — N186 End stage renal disease: Secondary | ICD-10-CM | POA: Diagnosis not present

## 2018-07-11 DIAGNOSIS — D509 Iron deficiency anemia, unspecified: Secondary | ICD-10-CM | POA: Diagnosis not present

## 2018-07-11 DIAGNOSIS — N2581 Secondary hyperparathyroidism of renal origin: Secondary | ICD-10-CM | POA: Diagnosis not present

## 2018-07-11 DIAGNOSIS — E875 Hyperkalemia: Secondary | ICD-10-CM | POA: Diagnosis not present

## 2018-07-13 DIAGNOSIS — D509 Iron deficiency anemia, unspecified: Secondary | ICD-10-CM | POA: Diagnosis not present

## 2018-07-13 DIAGNOSIS — E1122 Type 2 diabetes mellitus with diabetic chronic kidney disease: Secondary | ICD-10-CM | POA: Diagnosis not present

## 2018-07-13 DIAGNOSIS — N2581 Secondary hyperparathyroidism of renal origin: Secondary | ICD-10-CM | POA: Diagnosis not present

## 2018-07-13 DIAGNOSIS — N186 End stage renal disease: Secondary | ICD-10-CM | POA: Diagnosis not present

## 2018-07-13 DIAGNOSIS — E875 Hyperkalemia: Secondary | ICD-10-CM | POA: Diagnosis not present

## 2018-07-15 DIAGNOSIS — E1122 Type 2 diabetes mellitus with diabetic chronic kidney disease: Secondary | ICD-10-CM | POA: Diagnosis not present

## 2018-07-15 DIAGNOSIS — N2581 Secondary hyperparathyroidism of renal origin: Secondary | ICD-10-CM | POA: Diagnosis not present

## 2018-07-15 DIAGNOSIS — D509 Iron deficiency anemia, unspecified: Secondary | ICD-10-CM | POA: Diagnosis not present

## 2018-07-15 DIAGNOSIS — E875 Hyperkalemia: Secondary | ICD-10-CM | POA: Diagnosis not present

## 2018-07-15 DIAGNOSIS — N186 End stage renal disease: Secondary | ICD-10-CM | POA: Diagnosis not present

## 2018-07-18 DIAGNOSIS — D509 Iron deficiency anemia, unspecified: Secondary | ICD-10-CM | POA: Diagnosis not present

## 2018-07-18 DIAGNOSIS — E875 Hyperkalemia: Secondary | ICD-10-CM | POA: Diagnosis not present

## 2018-07-18 DIAGNOSIS — N2581 Secondary hyperparathyroidism of renal origin: Secondary | ICD-10-CM | POA: Diagnosis not present

## 2018-07-18 DIAGNOSIS — N186 End stage renal disease: Secondary | ICD-10-CM | POA: Diagnosis not present

## 2018-07-18 DIAGNOSIS — E1122 Type 2 diabetes mellitus with diabetic chronic kidney disease: Secondary | ICD-10-CM | POA: Diagnosis not present

## 2018-07-20 DIAGNOSIS — E1122 Type 2 diabetes mellitus with diabetic chronic kidney disease: Secondary | ICD-10-CM | POA: Diagnosis not present

## 2018-07-20 DIAGNOSIS — E875 Hyperkalemia: Secondary | ICD-10-CM | POA: Diagnosis not present

## 2018-07-20 DIAGNOSIS — D509 Iron deficiency anemia, unspecified: Secondary | ICD-10-CM | POA: Diagnosis not present

## 2018-07-20 DIAGNOSIS — N186 End stage renal disease: Secondary | ICD-10-CM | POA: Diagnosis not present

## 2018-07-20 DIAGNOSIS — N2581 Secondary hyperparathyroidism of renal origin: Secondary | ICD-10-CM | POA: Diagnosis not present

## 2018-07-22 DIAGNOSIS — E875 Hyperkalemia: Secondary | ICD-10-CM | POA: Diagnosis not present

## 2018-07-22 DIAGNOSIS — N2581 Secondary hyperparathyroidism of renal origin: Secondary | ICD-10-CM | POA: Diagnosis not present

## 2018-07-22 DIAGNOSIS — D509 Iron deficiency anemia, unspecified: Secondary | ICD-10-CM | POA: Diagnosis not present

## 2018-07-22 DIAGNOSIS — E1122 Type 2 diabetes mellitus with diabetic chronic kidney disease: Secondary | ICD-10-CM | POA: Diagnosis not present

## 2018-07-22 DIAGNOSIS — N186 End stage renal disease: Secondary | ICD-10-CM | POA: Diagnosis not present

## 2018-07-22 NOTE — Progress Notes (Deleted)
[place] at [facility] [address] [place], [place] [address] [phone]  Date:  07/24/2018   Name:  Bryan Wilkerson   DOB:  1965-07-11   MRN:  [id number]  PCP:  [name], MD    Chief Complaint: No chief complaint on file.   History of Present Illness:  Bryan Wilkerson is a 53 y.o. very pleasant male patient who presents with the following:  Periodic follow-up visit for this patient today.  He has history of CHF, hypertension and pulmonary hypertension, type 2 diabetes, end-stage renal disease on dialysis, seizures on medication  He was seen by pulmonology in [place] had planned an echo to monitor his pulmonary hypertension, but it looks like he did not come for this test He does use supplemental oxygen  Last visit with cardiology was in August His neurologist is Dr. Kyung Rudd with [facility] He is also consulting with the transplant service there for consideration of kidney transplant  ?  Who is his nephrologist and ready to go for dialysis?????????????? His endocrinologist is Dr. Chalmers Cater  He is married to [place], and they have 4 children.  2 grandchildren  Patient Active Problem List   Diagnosis Date Noted  . Restrictive lung disease 04/07/2018  . Impaired mobility and ADLs 03/09/2018  . Pulmonary hypertension, unspecified ([place]) 03/03/2018  . Fatigue 01/16/2018  . Transaminitis 12/30/2017  . Abdominal wall abscess 12/19/2017  . Peritonitis due to infected peritoneal dialysis catheter ([place]) 11/03/2017  . Uremia 08/25/2017  . Altered mental status 08/25/2017  . Sepsis ([place]) 08/02/2017  . Cellulitis and abscess of trunk 08/02/2017  . Diabetic polyneuropathy associated with type 2 diabetes mellitus ([place]) 06/05/2017  . Loculated pleural effusion 04/27/2017  . Pneumonia 04/19/2017  . Diabetes mellitus due to underlying condition, uncontrolled, with stage 4 chronic kidney disease, with long-term current use  of insulin ([place]) 01/13/2017  . Seizure ([place]) 11/23/2016  . Hypocalcemia 11/23/2016  . Chronic kidney disease 08/17/2016  . History of CHF (congestive heart failure) 05/28/2014  . Dependence on renal dialysis ([place]) 05/28/2014  . Bilateral cataracts 05/28/2014  . ESRD on dialysis ([place]) 11/16/2013  . Shortness of breath 10/31/2013  . CHF (congestive heart failure), NYHA class II ([place]) 12/29/2012  . Essential hypertension, benign 12/29/2012  . Hypokalemia 12/29/2012  . Anemia 12/29/2012    Past Medical History:  Diagnosis Date  . Anemia   . CHF (congestive heart failure) ([place])   . Diabetic retinopathy ([place])   . ESRD on peritoneal dialysis ([place])    "7 days/week" (04/20/2017)  . Hypertension   . Pneumonia 2016; 04/19/2017  . Psoriasis   . Type I diabetes mellitus ([place])     Past Surgical History:  Procedure Laterality Date  . AV FISTULA PLACEMENT Right 12/05/2013   Procedure: RADIOCEPHALIC VS. BRACHIOCEPHALIC ARTERIOVENOUS (AV) FISTULA CREATION;  Surgeon: Conrad Jamest[place], MD;  Location: [place];  Service: Vascular;  Laterality: Right;  . AV FISTULA PLACEMENT Left 07/20/2016   Procedure: LEFT ARM RADIOCEPHALIC ARTERIOVENOUS (AV) FISTULA CREATION;  Surgeon: Waynetta Sandy, MD;  Location: [place];  Service: Vascular;  Laterality: Left;  . CATARACT EXTRACTION W/ INTRAOCULAR LENS  IMPLANT, BILATERAL Bilateral   . EYE SURGERY    . FISTULOGRAM Right 07/16/2014   Procedure: FISTULOGRAM;  Surgeon: Conrad [place], MD;  Location: [place];  Service: Vascular;  Laterality: Right;  . IR THORACENTESIS ASP PLEURAL SPACE W/IMG GUIDE  05/02/2017  . LIGATION  OF COMPETING BRANCHES OF ARTERIOVENOUS FISTULA Right 07/16/2014   Procedure: LIGATION OF COMPETING BRANCHES OF ARTERIOVENOUS FISTULA;  Surgeon: Conrad Tajique, MD;  Location: [place];  Service: Vascular;  Laterality: Right;  . PERITONEAL CATHETER INSERTION Left ~ 08/2016  . PORT-A-CATH REMOVAL  2017  . PORTA CATH INSERTION Right    "for hemodialysis"  .  RETINAL DETACHMENT SURGERY Right     Social History   Tobacco Use  . Smoking status: Never Smoker  . Smokeless tobacco: Never Used  Substance Use Topics  . Alcohol use: No    Alcohol/week: 0.0 standard drinks  . Drug use: No    Family History  Problem Relation Age of Onset  . Hypertension Mother   . Heart attack Mother   . Chronic Renal Failure Neg Hx   . Diabetes Neg Hx   . Stroke Neg Hx   . Cancer Neg Hx     Allergies  Allergen Reactions  . Penicillins Other (See Comments)    UNSPECIFIED REACTION FROM CHILDHOOD Has patient had a PCN reaction causing immediate rash, facial/tongue/throat swelling, SOB or lightheadedness with hypotension:Yes Has patient had a PCN reaction causing severe rash involving mucus membranes or skin necrosis:No Has patient had a PCN reaction that required hospitalization:Yes Has patient had a PCN reaction occurring within the last 10 years:No If all of the above answers are "NO", then may proceed with Cephalosporin use.      Medication list has been reviewed and updated.  Current Outpatient Medications on File Prior to Visit  Medication Sig Dispense Refill  . amLODipine (NORVASC) 5 MG tablet Take 5 mg by mouth daily.     . calcitRIOL (ROCALTROL) 0.25 MCG capsule Take 2 capsules (0.5 mcg total) by mouth daily. (Patient taking differently: Take 0.25 mcg by mouth 3 (three) times daily. ) 30 capsule 0  . carvedilol (COREG) 6.25 MG tablet Take 1 tablet (6.25 mg total) by mouth 2 (two) times daily with a meal. 60 tablet 0  . Lacosamide 150 MG TABS Take 150 mg by mouth daily.     [name] LEVEMIR FLEXTOUCH 100 UNIT/ML Pen Inject 2-8 Units into the skin daily at 10 pm. Sliding Scale    . NOVOLOG FLEXPEN 100 UNIT/ML FlexPen Sliding scale over  150-315 1 units    . OTEZLA 30 MG TABS Take 30 mg by mouth 2 (two) times daily.     . OXYGEN Inhale 3 L into the lungs continuous.    . sucroferric oxyhydroxide (VELPHORO) 500 MG chewable tablet Chew 500 mg by mouth  daily.     [name] triamcinolone ointment (KENALOG) 0.1 % Apply 1 application topically as needed (psorasis).      No current facility-administered medications on file prior to visit.     Review of Systems:  .aspoer   Physical Examination: There were no vitals filed for this visit. There were no vitals filed for this visit. There is no height or weight on file to calculate BMI. Ideal Body Weight:    GEN: WDWN, NAD, Non-toxic, A & O x 3 HEENT: Atraumatic, Normocephalic. Neck supple. No masses, No LAD. Ears and Nose: No external deformity. CV: RRR, No M/G/R. No JVD. No thrill. No extra heart sounds. PULM: CTA B, no wheezes, crackles, rhonchi. No retractions. No resp. distress. No accessory muscle use. ABD: S, NT, ND, +BS. No rebound. No HSM. EXTR: No c/c/e NEURO Normal gait.  PSYCH: Normally interactive. Conversant. Not depressed or anxious appearing.  Calm demeanor.  Assessment and Plan: ***  Signed Lamar Blinks, MD

## 2018-07-24 ENCOUNTER — Ambulatory Visit: Payer: Medicare Other | Admitting: Family Medicine

## 2018-07-24 DIAGNOSIS — I871 Compression of vein: Secondary | ICD-10-CM | POA: Diagnosis not present

## 2018-07-24 DIAGNOSIS — T82858A Stenosis of vascular prosthetic devices, implants and grafts, initial encounter: Secondary | ICD-10-CM | POA: Diagnosis not present

## 2018-07-24 DIAGNOSIS — Z0289 Encounter for other administrative examinations: Secondary | ICD-10-CM

## 2018-07-24 DIAGNOSIS — N186 End stage renal disease: Secondary | ICD-10-CM | POA: Diagnosis not present

## 2018-07-24 DIAGNOSIS — Z992 Dependence on renal dialysis: Secondary | ICD-10-CM | POA: Diagnosis not present

## 2018-07-25 DIAGNOSIS — E875 Hyperkalemia: Secondary | ICD-10-CM | POA: Diagnosis not present

## 2018-07-25 DIAGNOSIS — N186 End stage renal disease: Secondary | ICD-10-CM | POA: Diagnosis not present

## 2018-07-25 DIAGNOSIS — E1122 Type 2 diabetes mellitus with diabetic chronic kidney disease: Secondary | ICD-10-CM | POA: Diagnosis not present

## 2018-07-25 DIAGNOSIS — D509 Iron deficiency anemia, unspecified: Secondary | ICD-10-CM | POA: Diagnosis not present

## 2018-07-25 DIAGNOSIS — N2581 Secondary hyperparathyroidism of renal origin: Secondary | ICD-10-CM | POA: Diagnosis not present

## 2018-07-26 IMAGING — US IR THORACENTESIS ASP PLEURAL SPACE W/IMG GUIDE
1 series · 5 of 5 positions shown · non-contrast
Comparison: none

INDICATION: History of end-stage renal disease on hemodialysis with a left
pleural effusion. He was just recently discharged for community
acquired pneumonia with a pleural effusion noted at that time. A
request is made for a diagnostic and therapeutic thoracentesis
today.

[Series 1: ir thoracentesis asp pleural space w/img guide · 5 of 5 slices shown]
[im 1/5]
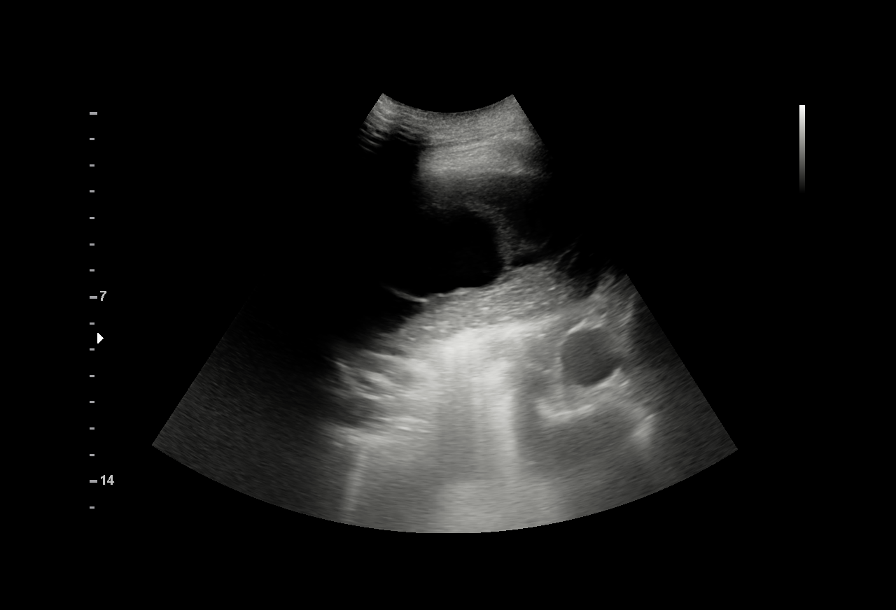
[im 2/5]
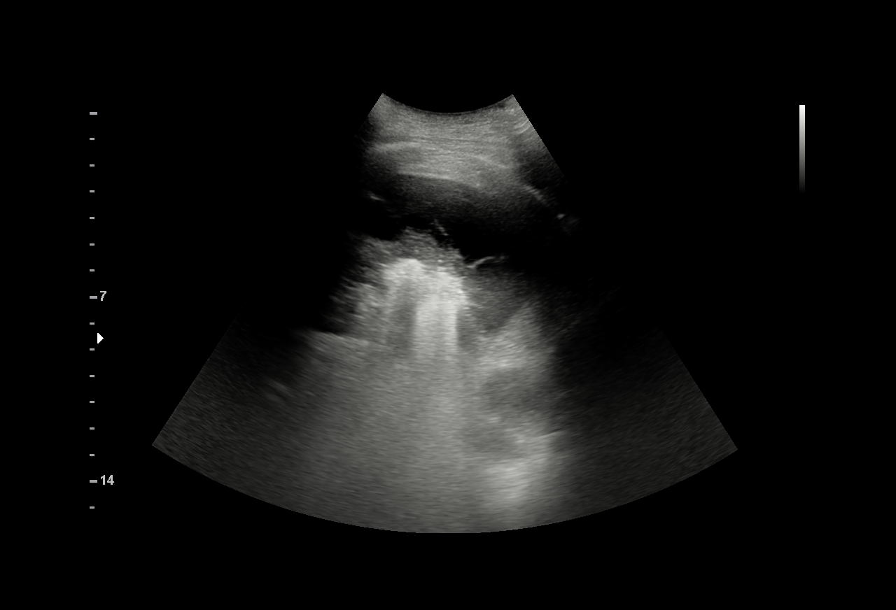
[im 3/5]
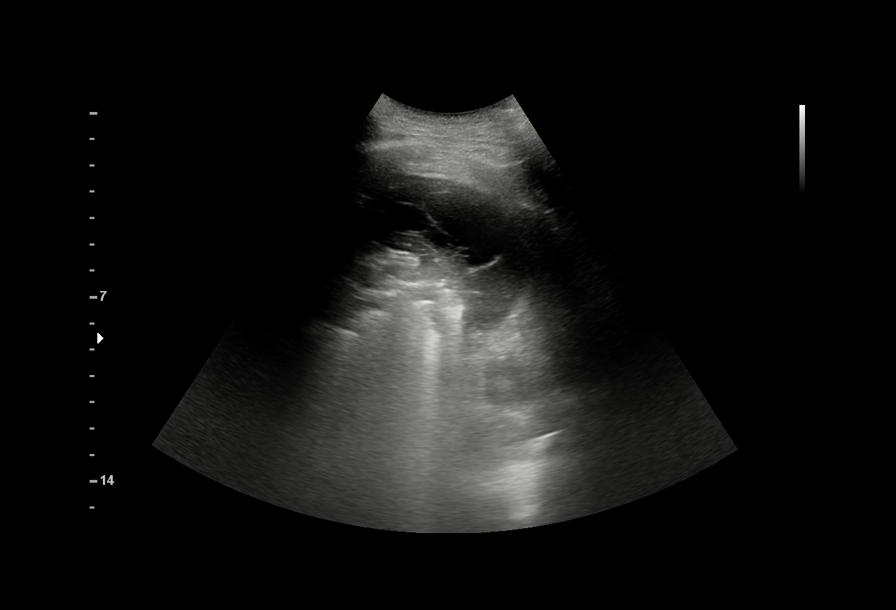
[im 4/5]
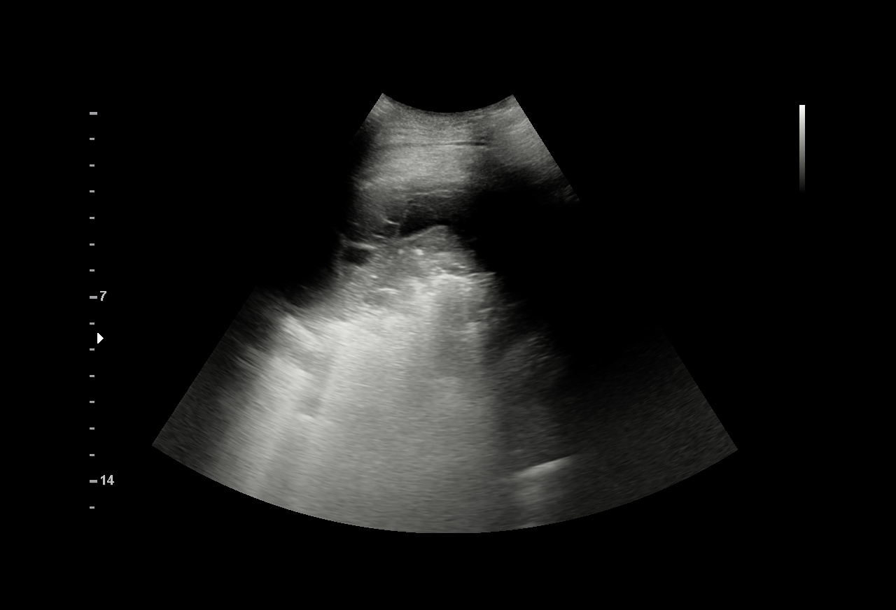
[im 5/5]
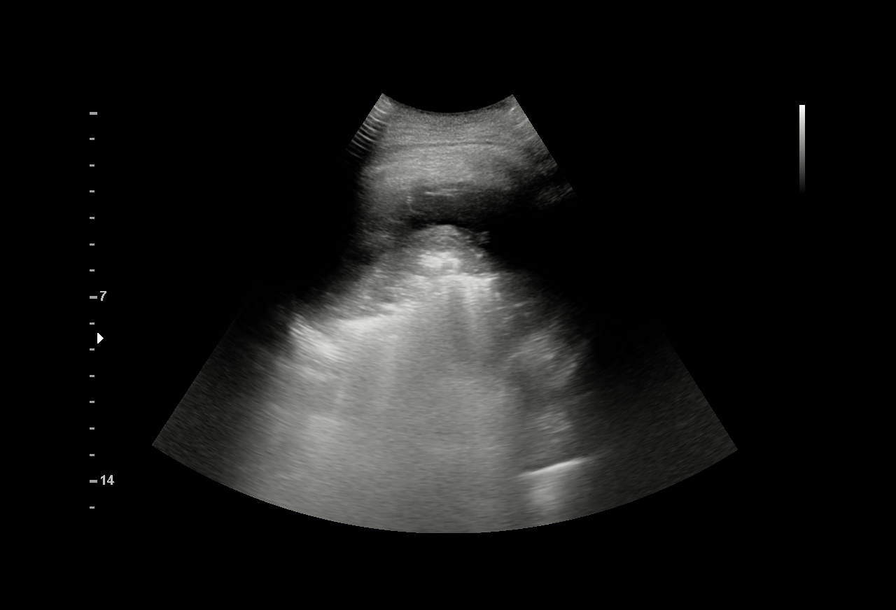

[5 of 5 positions shown; findings below may reference images not displayed]

EXAM:
ULTRASOUND GUIDED DIAGNOSTIC AND THERAPEUTIC THORACENTESIS

MEDICATIONS:
1% lidocaine

COMPLICATIONS:
None immediate.

PROCEDURE:
An ultrasound guided thoracentesis was thoroughly discussed with the
patient and questions answered. The benefits, risks, alternatives
and complications were also discussed. The patient understands and
wishes to proceed with the procedure. Written consent was obtained.

Ultrasound was performed to localize and mark an adequate pocket of
fluid in the left chest. The area was then prepped and draped in the
normal sterile fashion. 1% Lidocaine was used for local anesthesia.
Under ultrasound guidance a Safe-T-Centesis catheter was introduced.
Thoracentesis was performed. The catheter was removed and a dressing
applied.
FINDINGS: A total of approximately 120 cc of slightly cloudy serous fluid was
removed. Samples were sent to the laboratory as requested by the
clinical team. The fluid appeared to be complex with multiple small
loculations.
IMPRESSION: Successful ultrasound guided left thoracentesis yielding 120 cc of
pleural fluid.

## 2018-07-26 IMAGING — DX DG CHEST 1V
1 series · 1 of 1 positions shown · non-contrast
Comparison: Radiographs April 27, 2017.

CLINICAL DATA: Status post left thoracentesis.

EXAM:
CHEST 1 VIEW

[w chest pa]
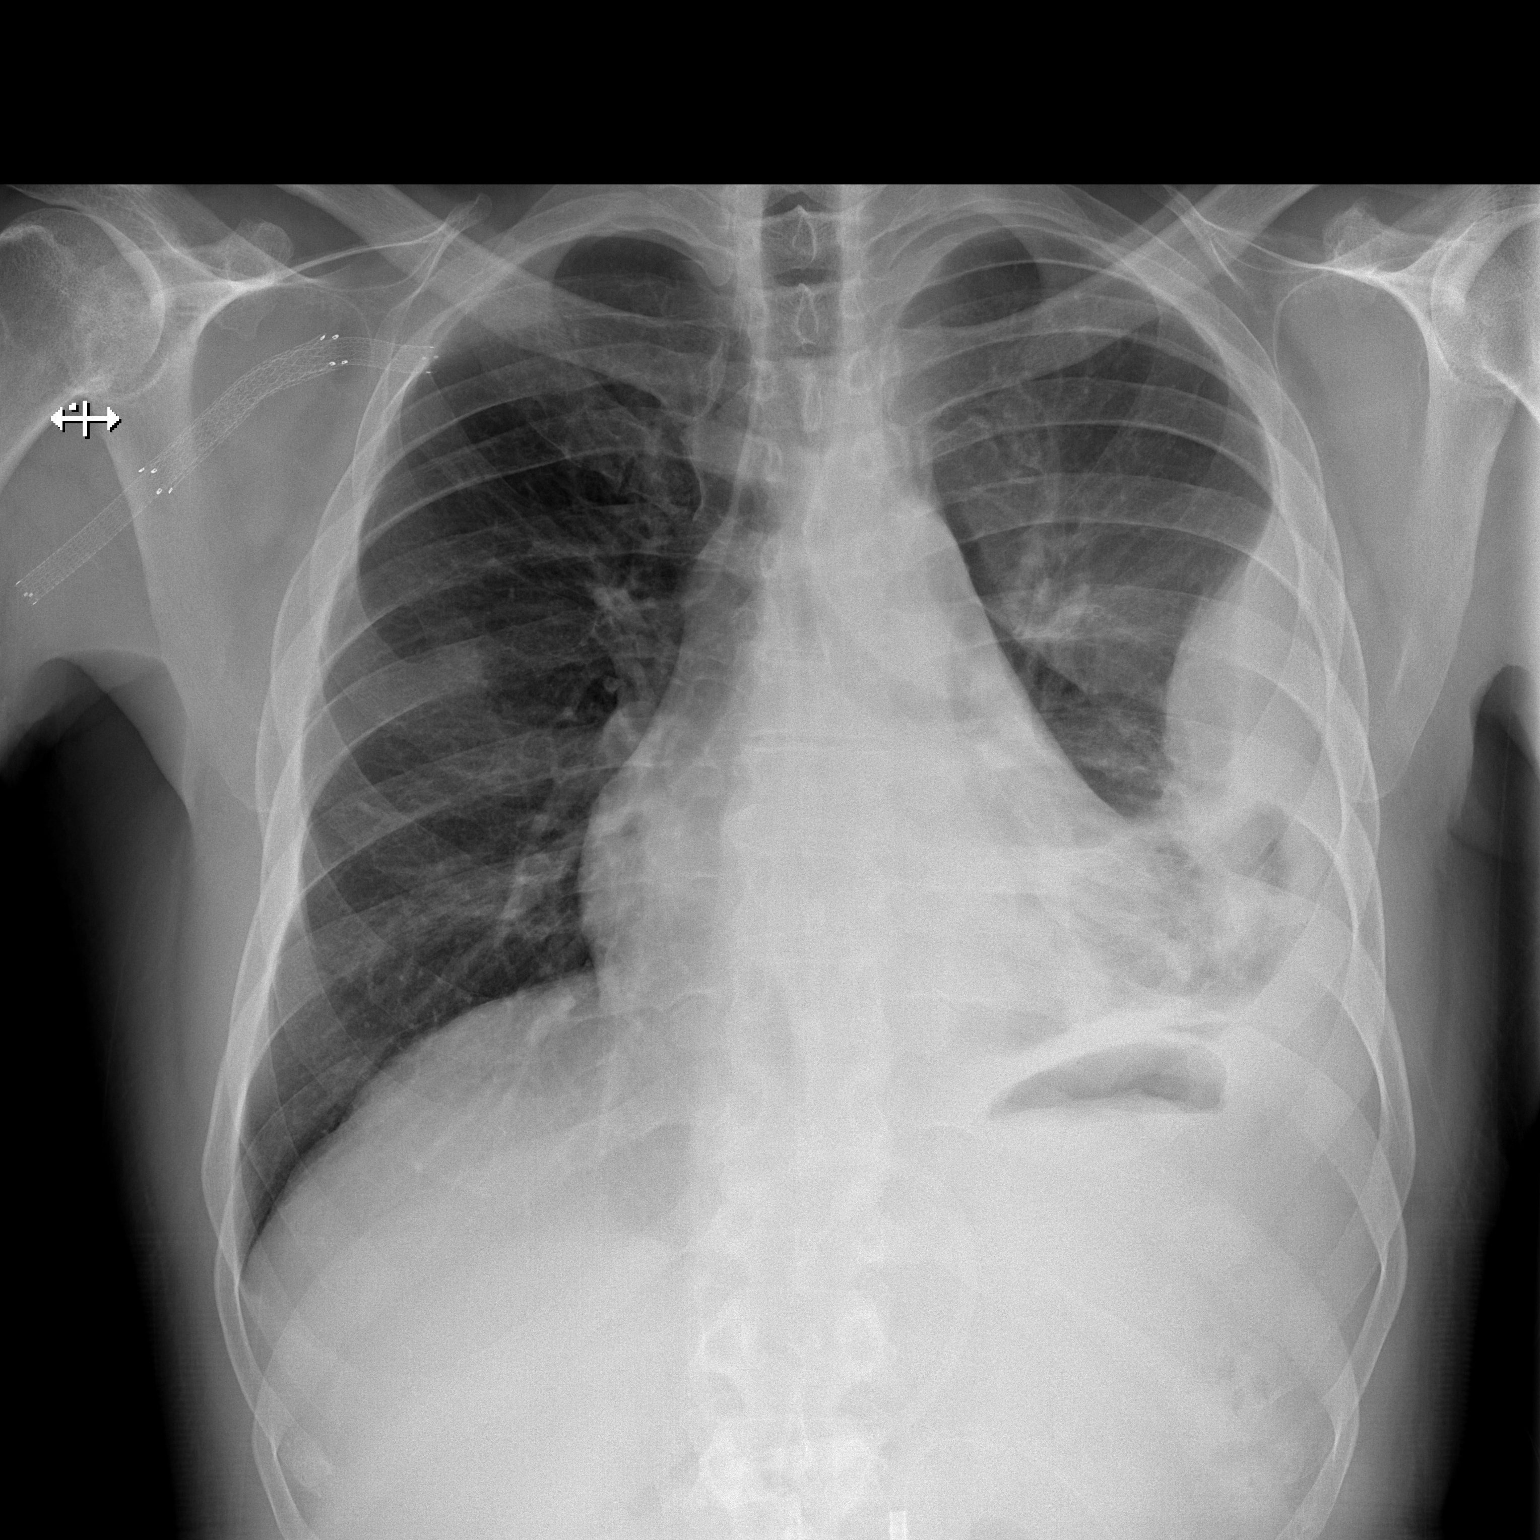

[1 of 1 positions shown; findings below may reference images not displayed]

FINDINGS: Stable cardiomediastinal silhouette. Right lung is clear. Left
pleural effusion is significantly smaller status post thoracentesis.
Bony thorax is unremarkable. Left basilar atelectasis or scarring is
noted. Bony thorax is unremarkable.
IMPRESSION: Left pleural effusion is significantly smaller status post
thoracentesis. No pneumothorax is noted.

## 2018-07-27 DIAGNOSIS — N186 End stage renal disease: Secondary | ICD-10-CM | POA: Diagnosis not present

## 2018-07-27 DIAGNOSIS — E1122 Type 2 diabetes mellitus with diabetic chronic kidney disease: Secondary | ICD-10-CM | POA: Diagnosis not present

## 2018-07-27 DIAGNOSIS — E875 Hyperkalemia: Secondary | ICD-10-CM | POA: Diagnosis not present

## 2018-07-27 DIAGNOSIS — N2581 Secondary hyperparathyroidism of renal origin: Secondary | ICD-10-CM | POA: Diagnosis not present

## 2018-07-27 DIAGNOSIS — D509 Iron deficiency anemia, unspecified: Secondary | ICD-10-CM | POA: Diagnosis not present

## 2018-07-29 DIAGNOSIS — E875 Hyperkalemia: Secondary | ICD-10-CM | POA: Diagnosis not present

## 2018-07-29 DIAGNOSIS — D509 Iron deficiency anemia, unspecified: Secondary | ICD-10-CM | POA: Diagnosis not present

## 2018-07-29 DIAGNOSIS — E1122 Type 2 diabetes mellitus with diabetic chronic kidney disease: Secondary | ICD-10-CM | POA: Diagnosis not present

## 2018-07-29 DIAGNOSIS — N2581 Secondary hyperparathyroidism of renal origin: Secondary | ICD-10-CM | POA: Diagnosis not present

## 2018-07-29 DIAGNOSIS — N186 End stage renal disease: Secondary | ICD-10-CM | POA: Diagnosis not present

## 2018-07-31 DIAGNOSIS — Z79899 Other long term (current) drug therapy: Secondary | ICD-10-CM | POA: Diagnosis not present

## 2018-07-31 DIAGNOSIS — R569 Unspecified convulsions: Secondary | ICD-10-CM | POA: Diagnosis not present

## 2018-07-31 DIAGNOSIS — Z992 Dependence on renal dialysis: Secondary | ICD-10-CM | POA: Diagnosis not present

## 2018-08-01 DIAGNOSIS — E875 Hyperkalemia: Secondary | ICD-10-CM | POA: Diagnosis not present

## 2018-08-01 DIAGNOSIS — N2581 Secondary hyperparathyroidism of renal origin: Secondary | ICD-10-CM | POA: Diagnosis not present

## 2018-08-01 DIAGNOSIS — N186 End stage renal disease: Secondary | ICD-10-CM | POA: Diagnosis not present

## 2018-08-01 DIAGNOSIS — D509 Iron deficiency anemia, unspecified: Secondary | ICD-10-CM | POA: Diagnosis not present

## 2018-08-01 DIAGNOSIS — E1122 Type 2 diabetes mellitus with diabetic chronic kidney disease: Secondary | ICD-10-CM | POA: Diagnosis not present

## 2018-08-03 DIAGNOSIS — N2581 Secondary hyperparathyroidism of renal origin: Secondary | ICD-10-CM | POA: Diagnosis not present

## 2018-08-03 DIAGNOSIS — N186 End stage renal disease: Secondary | ICD-10-CM | POA: Diagnosis not present

## 2018-08-03 DIAGNOSIS — E875 Hyperkalemia: Secondary | ICD-10-CM | POA: Diagnosis not present

## 2018-08-03 DIAGNOSIS — E1122 Type 2 diabetes mellitus with diabetic chronic kidney disease: Secondary | ICD-10-CM | POA: Diagnosis not present

## 2018-08-03 DIAGNOSIS — D509 Iron deficiency anemia, unspecified: Secondary | ICD-10-CM | POA: Diagnosis not present

## 2018-08-05 DIAGNOSIS — E1122 Type 2 diabetes mellitus with diabetic chronic kidney disease: Secondary | ICD-10-CM | POA: Diagnosis not present

## 2018-08-05 DIAGNOSIS — N2581 Secondary hyperparathyroidism of renal origin: Secondary | ICD-10-CM | POA: Diagnosis not present

## 2018-08-05 DIAGNOSIS — Z992 Dependence on renal dialysis: Secondary | ICD-10-CM | POA: Diagnosis not present

## 2018-08-05 DIAGNOSIS — N186 End stage renal disease: Secondary | ICD-10-CM | POA: Diagnosis not present

## 2018-08-05 DIAGNOSIS — E1129 Type 2 diabetes mellitus with other diabetic kidney complication: Secondary | ICD-10-CM | POA: Diagnosis not present

## 2018-08-08 DIAGNOSIS — N2581 Secondary hyperparathyroidism of renal origin: Secondary | ICD-10-CM | POA: Diagnosis not present

## 2018-08-08 DIAGNOSIS — N186 End stage renal disease: Secondary | ICD-10-CM | POA: Diagnosis not present

## 2018-08-08 DIAGNOSIS — E1122 Type 2 diabetes mellitus with diabetic chronic kidney disease: Secondary | ICD-10-CM | POA: Diagnosis not present

## 2018-08-10 DIAGNOSIS — N186 End stage renal disease: Secondary | ICD-10-CM | POA: Diagnosis not present

## 2018-08-10 DIAGNOSIS — E1122 Type 2 diabetes mellitus with diabetic chronic kidney disease: Secondary | ICD-10-CM | POA: Diagnosis not present

## 2018-08-10 DIAGNOSIS — N2581 Secondary hyperparathyroidism of renal origin: Secondary | ICD-10-CM | POA: Diagnosis not present

## 2018-08-12 DIAGNOSIS — E1122 Type 2 diabetes mellitus with diabetic chronic kidney disease: Secondary | ICD-10-CM | POA: Diagnosis not present

## 2018-08-12 DIAGNOSIS — N186 End stage renal disease: Secondary | ICD-10-CM | POA: Diagnosis not present

## 2018-08-12 DIAGNOSIS — N2581 Secondary hyperparathyroidism of renal origin: Secondary | ICD-10-CM | POA: Diagnosis not present

## 2018-08-15 DIAGNOSIS — N2581 Secondary hyperparathyroidism of renal origin: Secondary | ICD-10-CM | POA: Diagnosis not present

## 2018-08-15 DIAGNOSIS — E1122 Type 2 diabetes mellitus with diabetic chronic kidney disease: Secondary | ICD-10-CM | POA: Diagnosis not present

## 2018-08-15 DIAGNOSIS — N186 End stage renal disease: Secondary | ICD-10-CM | POA: Diagnosis not present

## 2018-08-17 DIAGNOSIS — N186 End stage renal disease: Secondary | ICD-10-CM | POA: Diagnosis not present

## 2018-08-17 DIAGNOSIS — N2581 Secondary hyperparathyroidism of renal origin: Secondary | ICD-10-CM | POA: Diagnosis not present

## 2018-08-17 DIAGNOSIS — E1122 Type 2 diabetes mellitus with diabetic chronic kidney disease: Secondary | ICD-10-CM | POA: Diagnosis not present

## 2018-08-19 DIAGNOSIS — E1122 Type 2 diabetes mellitus with diabetic chronic kidney disease: Secondary | ICD-10-CM | POA: Diagnosis not present

## 2018-08-19 DIAGNOSIS — N186 End stage renal disease: Secondary | ICD-10-CM | POA: Diagnosis not present

## 2018-08-19 DIAGNOSIS — N2581 Secondary hyperparathyroidism of renal origin: Secondary | ICD-10-CM | POA: Diagnosis not present

## 2018-08-22 DIAGNOSIS — N186 End stage renal disease: Secondary | ICD-10-CM | POA: Diagnosis not present

## 2018-08-22 DIAGNOSIS — N2581 Secondary hyperparathyroidism of renal origin: Secondary | ICD-10-CM | POA: Diagnosis not present

## 2018-08-22 DIAGNOSIS — E1122 Type 2 diabetes mellitus with diabetic chronic kidney disease: Secondary | ICD-10-CM | POA: Diagnosis not present

## 2018-08-24 DIAGNOSIS — E1122 Type 2 diabetes mellitus with diabetic chronic kidney disease: Secondary | ICD-10-CM | POA: Diagnosis not present

## 2018-08-24 DIAGNOSIS — N2581 Secondary hyperparathyroidism of renal origin: Secondary | ICD-10-CM | POA: Diagnosis not present

## 2018-08-24 DIAGNOSIS — N186 End stage renal disease: Secondary | ICD-10-CM | POA: Diagnosis not present

## 2018-08-26 DIAGNOSIS — N2581 Secondary hyperparathyroidism of renal origin: Secondary | ICD-10-CM | POA: Diagnosis not present

## 2018-08-26 DIAGNOSIS — E1122 Type 2 diabetes mellitus with diabetic chronic kidney disease: Secondary | ICD-10-CM | POA: Diagnosis not present

## 2018-08-26 DIAGNOSIS — N186 End stage renal disease: Secondary | ICD-10-CM | POA: Diagnosis not present

## 2018-08-28 ENCOUNTER — Ambulatory Visit: Payer: 59 | Attending: Nephrology | Admitting: Physical Therapy

## 2018-08-29 DIAGNOSIS — E1122 Type 2 diabetes mellitus with diabetic chronic kidney disease: Secondary | ICD-10-CM | POA: Diagnosis not present

## 2018-08-29 DIAGNOSIS — N2581 Secondary hyperparathyroidism of renal origin: Secondary | ICD-10-CM | POA: Diagnosis not present

## 2018-08-29 DIAGNOSIS — N186 End stage renal disease: Secondary | ICD-10-CM | POA: Diagnosis not present

## 2018-08-31 DIAGNOSIS — E1122 Type 2 diabetes mellitus with diabetic chronic kidney disease: Secondary | ICD-10-CM | POA: Diagnosis not present

## 2018-08-31 DIAGNOSIS — N186 End stage renal disease: Secondary | ICD-10-CM | POA: Diagnosis not present

## 2018-08-31 DIAGNOSIS — N2581 Secondary hyperparathyroidism of renal origin: Secondary | ICD-10-CM | POA: Diagnosis not present

## 2018-09-02 DIAGNOSIS — N2581 Secondary hyperparathyroidism of renal origin: Secondary | ICD-10-CM | POA: Diagnosis not present

## 2018-09-02 DIAGNOSIS — N186 End stage renal disease: Secondary | ICD-10-CM | POA: Diagnosis not present

## 2018-09-02 DIAGNOSIS — E1122 Type 2 diabetes mellitus with diabetic chronic kidney disease: Secondary | ICD-10-CM | POA: Diagnosis not present

## 2018-09-03 DIAGNOSIS — N186 End stage renal disease: Secondary | ICD-10-CM | POA: Diagnosis not present

## 2018-09-03 DIAGNOSIS — Z992 Dependence on renal dialysis: Secondary | ICD-10-CM | POA: Diagnosis not present

## 2018-09-03 DIAGNOSIS — E1129 Type 2 diabetes mellitus with other diabetic kidney complication: Secondary | ICD-10-CM | POA: Diagnosis not present

## 2018-09-05 DIAGNOSIS — N2581 Secondary hyperparathyroidism of renal origin: Secondary | ICD-10-CM | POA: Diagnosis not present

## 2018-09-05 DIAGNOSIS — N186 End stage renal disease: Secondary | ICD-10-CM | POA: Diagnosis not present

## 2018-09-06 ENCOUNTER — Ambulatory Visit: Payer: Medicare Other | Attending: Nephrology | Admitting: Physical Therapy

## 2018-09-06 ENCOUNTER — Encounter: Payer: Self-pay | Admitting: Physical Therapy

## 2018-09-06 ENCOUNTER — Other Ambulatory Visit: Payer: Self-pay

## 2018-09-06 VITALS — BP 138/80 | HR 79

## 2018-09-06 DIAGNOSIS — M6281 Muscle weakness (generalized): Secondary | ICD-10-CM | POA: Diagnosis not present

## 2018-09-06 DIAGNOSIS — R262 Difficulty in walking, not elsewhere classified: Secondary | ICD-10-CM | POA: Diagnosis not present

## 2018-09-06 DIAGNOSIS — R2689 Other abnormalities of gait and mobility: Secondary | ICD-10-CM | POA: Diagnosis not present

## 2018-09-06 DIAGNOSIS — R2681 Unsteadiness on feet: Secondary | ICD-10-CM | POA: Diagnosis not present

## 2018-09-06 NOTE — Therapy (Signed)
[facility] [address]  [place], [place], [address] Phone: [phone]   Fax:  [phone]  Physical Therapy Evaluation  Patient Details  Name: ARLENE GENOVA MRN: [id number] Date of Birth: 1971-08-15 Referring Provider (PT): Otelia Santee, MD   Encounter Date: 09/06/2018  PT End of Session - 09/06/18 1450    Visit Number  1    Number of Visits  17    Date for PT Re-Evaluation  11/01/18    Authorization Type  Medicare & UHC    PT Start Time  5885    PT Stop Time  1442    PT Time Calculation (min)  45 min    Equipment Utilized During Treatment  Gait belt    Activity Tolerance  Patient tolerated treatment well   limited by dropping O2 levels   Behavior During Therapy  WFL for tasks assessed/performed       Past Medical History:  Diagnosis Date  . Anemia   . CHF (congestive heart failure) ([place])   . Diabetic retinopathy ([place])   . ESRD on peritoneal dialysis ([place])    "7 days/week" (04/20/2017)  . Hypertension   . Pneumonia 2016; 04/19/2017  . Psoriasis   . Type I diabetes mellitus ([place])     Past Surgical History:  Procedure Laterality Date  . AV FISTULA PLACEMENT Right 12/05/2013   Procedure: RADIOCEPHALIC VS. BRACHIOCEPHALIC ARTERIOVENOUS (AV) FISTULA CREATION;  Surgeon: Conrad [place]ville, MD;  Location: [facility];  Service: Vascular;  Laterality: Right;  . AV FISTULA PLACEMENT Left 07/20/2016   Procedure: LEFT ARM RADIOCEPHALIC ARTERIOVENOUS (AV) FISTULA CREATION;  Surgeon: Waynetta Sandy, MD;  Location: [facility];  Service: Vascular;  Laterality: Left;  . CATARACT EXTRACTION W/ INTRAOCULAR LENS  IMPLANT, BILATERAL Bilateral   . EYE SURGERY    . FISTULOGRAM Right 07/16/2014   Procedure: FISTULOGRAM;  Surgeon: Conrad Stanton, MD;  Location: [facility];  Service: Vascular;  Laterality: Right;  . IR THORACENTESIS ASP PLEURAL SPACE W/IMG GUIDE  05/02/2017  . LIGATION OF COMPETING BRANCHES OF ARTERIOVENOUS FISTULA Right  07/16/2014   Procedure: LIGATION OF COMPETING BRANCHES OF ARTERIOVENOUS FISTULA;  Surgeon: Conrad Mahinahina, MD;  Location: [facility];  Service: Vascular;  Laterality: Right;  . PERITONEAL CATHETER INSERTION Left ~ 08/2016  . PORT-A-CATH REMOVAL  2017  . PORTA CATH INSERTION Right    "for hemodialysis"  . RETINAL DETACHMENT SURGERY Right     Vitals:   09/06/18 1400 09/06/18 1422 09/06/18 1428 09/06/18 1445  BP: 138/80     Pulse: 75 85 87 79  SpO2: 91% (!) 80% (!) 77% 90%     Subjective Assessment - 09/06/18 1402    Subjective  Patient present with wife. Both reporting that patient's weakness has been going on for 1-2 years. Reports he had an infection at his peritoneal dialysis catheter site about a year ago. Underwent a lot of complications and seizures since this, and believes the weakness is d/t this. However, wife reports that she believes his weakness started when he started undergoing dialysis. Reports that he has been unstable with walking, leaning to one side with walking, and exhaustion with stairs. At one point he was using a walker and still does lean on his wife for support. Bouts of imbalance come and go- has trouble with feeling faint but this lasts seconds to minutes and without lasting effects. Does not coincide with transfers or position changes. Wife reports several falls in  the past 6 months, patient unaware because he "does not know it is happening." Has dialysis on Tues, Thurs, Sat. Reports that he is on O2 supplement at home, but did not bring it today.    Patient is accompained by:  Family member   wife   Pertinent History  DM type I, psoriasis, HTN, ESRD on peritoneal dialysis, diabetic retinopathy, CHF, anemia, GERD, hx of seizures, MRSA positive 08/02/17    Limitations  Lifting;Standing;Walking;House hold activities    How long can you stand comfortably?  5-15 min; varies    How long can you walk comfortably?  30 min    Patient Stated Goals  get back in shape, get strength  back, get balance back    Currently in Pain?  No/denies         [facility] PT Assessment - 09/06/18 1411      Assessment   Medical Diagnosis  Generalized Muscle Weakness    Referring Provider (PT)  Otelia Santee, MD    Onset Date/Surgical Date  09/05/16    Next MD Visit  not scheduled    Prior Therapy  no      Precautions   Precautions  Fall   hemodialysis 3x/week, hx of seizures     Restrictions   Weight Bearing Restrictions  No      Balance Screen   Has the patient fallen in the past 6 months  Yes    How many times?  several    Has the patient had a decrease in activity level because of a fear of falling?   No    Is the patient reluctant to leave their home because of a fear of falling?   No      Home Film/video editor residence    Living Arrangements  Spouse/significant other    Available Help at Discharge  Family    Type of [place] Access  Level entry    [place]  Two level    Alternate Level Stairs-Number of Steps  16    Alternate Level Stairs-Rails  Right    [place] - 2 wheels      Prior Function   Level of [place]  On disability    Leisure  fishing, bowling, walking, yardwork      Cognition   Overall Cognitive Status  Within Functional Limits for tasks assessed      Sensation   Light Touch  Appears Intact   neuropathy in B hands and feet     Coordination   Gross Motor Movements are Fluid and Coordinated  Yes      Posture/Postural Control   Posture/Postural Control  Postural limitations    Postural Limitations  Weight shift right;Weight shift left;Rounded Shoulders   trunk flexed     ROM / Strength   AROM / PROM / Strength  Strength      Strength   Strength Assessment Site  Hip;Knee;Ankle    Right/Left Hip  Right;Left    Right Hip Flexion  4+/5    Right Hip ABduction  4/5    Right Hip ADduction  4/5    Left Hip Flexion  4/5    Left Hip ABduction  4/5    Left Hip  ADduction  4/5    Right/Left Knee  Right;Left    Right Knee Flexion  4/5    Right Knee Extension  4+/5  Left Knee Flexion  4/5    Left Knee Extension  4+/5    Right/Left Ankle  Right;Left    Right Ankle Dorsiflexion  4/5    Right Ankle Plantar Flexion  4/5    Left Ankle Dorsiflexion  4/5    Left Ankle Plantar Flexion  4/5      Ambulation/Gait   Gait Pattern  Step-through pattern;Ataxic   slight sensory ataxia and tendency for B toe-in     Standardized Balance Assessment   Standardized Balance Assessment  Dynamic Gait Index;Five Times Sit to Stand    Five times sit to stand comments   15.1   without UE support     Dynamic Gait Index   Level Surface  Normal    Change in Gait Speed  Mild Impairment    Gait with Horizontal Head Turns  Mild Impairment    Gait with Vertical Head Turns  Normal    Gait and Pivot Turn  Normal    Step Over Obstacle  Mild Impairment    Step Around Obstacles  Mild Impairment    Steps  Moderate Impairment    Total Score  18                Objective measurements completed on examination: See above findings.              PT Education - 09/06/18 1450    Education Details  prognosis, POC, HEP; edu on use of pursed lip breathing and advised to bring supplemental O2 to next session    Person(s) Educated  Patient;Spouse    Methods  Explanation;Demonstration;Tactile cues;Verbal cues;Handout    Comprehension  Verbalized understanding;Returned demonstration       PT Short Term Goals - 09/06/18 1502      PT SHORT TERM GOAL #1   Title  Patient to be independent with initial HEP.    Time  4    Period  Weeks    Status  New    Target Date  10/04/18        PT Long Term Goals - 09/06/18 1503      PT LONG TERM GOAL #1   Title  Patient to be independent with advanced HEP.    Time  8    Period  Weeks    Status  New    Target Date  11/01/18      PT LONG TERM GOAL #2   Title  Patient to demonstrate B LE strength >=4+/5.    Time   8    Period  Weeks    Status  New    Target Date  11/01/18      PT LONG TERM GOAL #3   Title  Patient to score >19/24 on DGI in order to decrease risk of falls.     Time  8    Period  Weeks    Status  New    Target Date  11/01/18      PT LONG TERM GOAL #4   Title  Patient to report 75% improvement in fatigue/SOB with climbing 16 steps without handrail at home.     Time  8    Period  Weeks    Status  New    Target Date  11/01/18      PT LONG TERM GOAL #5   Title  Patient to report tolerance of 1 hour of standing/walking without fatigue limiting.     Time  8    Period  Weeks    Status  New    Target Date  11/01/18             Plan - 09/06/18 1451    Clinical Impression Statement  Patient is a 53y/o M on hemodialysis 3x/week, presenting to OPPT with wife with c/o progressive weakness of 1-2 years duration. Patient is on 2L supplemental O2, however did not bring it today. Reports that he has been unstable with walking, leaning to one side with walking, and feeling exhaustion with stairs. Also with intermittent bouts of imbalance, feeling faint, and confusion that lasts seconds to minutes. Patient also with hx of seizures. Patient today with limited B LE strength, decreased endurance and dropping O2 saturation with light activity, and decreased dynamic balance. Patient scored 18/24 on DGI, indicating increased risk of falls. Patient with quickly dropping O2 saturation during appointment, requiring instruction on pursed lip breathing. Advised patient to bring his O2 tank next session. Educated wife and patient on balance and strengthening HEP to be performed at counter top- both reported understanding. Would benefit from skilled PT services 2x/week for 8 weeks to address aforementioned impairments.     Personal Factors and Comorbidities  Past/Current Experience;Comorbidity 3+;Time since onset of injury/illness/exacerbation    Comorbidities  DM type I, psoriasis, HTN, ESRD on peritoneal  dialysis, diabetic retinopathy, CHF, anemia, GERD, hx of seizures, MRSA positive 08/02/17    Examination-Activity Limitations  Carry;Squat;Stairs;Stand;Lift;Transfers;Locomotion Level    Examination-Participation Restrictions  Cleaning;Shop;Community Activity;Driving;Interpersonal Relationship;Yard Work;Laundry;Meal Prep    Stability/Clinical Decision Making  Unstable/Unpredictable    Clinical Decision Making  High    Rehab Potential  Good    PT Frequency  2x / week    PT Duration  8 weeks    PT Treatment/Interventions  ADLs/Self Care Home Management;Cryotherapy;Electrical Stimulation;Functional mobility training;Stair training;Gait training;DME Instruction;Moist Heat;Therapeutic activities;Therapeutic exercise;Balance training;Neuromuscular re-education;Patient/family education;Passive range of motion;Manual techniques;Dry needling;Energy conservation;Splinting;Taping    PT Next Visit Plan  reassess HEP    Consulted and Agree with Plan of Care  Patient       Patient will benefit from skilled therapeutic intervention in order to improve the following deficits and impairments:  Abnormal gait, Decreased endurance, Decreased activity tolerance, Decreased strength, Decreased balance, Difficulty walking, Improper body mechanics, Postural dysfunction  Visit Diagnosis: Muscle weakness (generalized)  Difficulty in walking, not elsewhere classified  Unsteadiness on feet  Other abnormalities of gait and mobility     Problem List Patient Active Problem List   Diagnosis Date Noted  . Restrictive lung disease 04/07/2018  . Impaired mobility and ADLs 03/09/2018  . Pulmonary hypertension, unspecified ([place]) 03/03/2018  . Fatigue 01/16/2018  . Transaminitis 12/30/2017  . Abdominal wall abscess 12/19/2017  . Peritonitis due to infected peritoneal dialysis catheter ([place]) 11/03/2017  . Uremia 08/25/2017  . Altered mental status 08/25/2017  . Sepsis ([place]) 08/02/2017  . Cellulitis and abscess of  trunk 08/02/2017  . Diabetic polyneuropathy associated with type 2 diabetes mellitus ([place]) 06/05/2017  . Loculated pleural effusion 04/27/2017  . Pneumonia 04/19/2017  . Diabetes mellitus due to underlying condition, uncontrolled, with stage 4 chronic kidney disease, with long-term current use of insulin ([place]) 01/13/2017  . Seizure ([place]) 11/23/2016  . Hypocalcemia 11/23/2016  . Chronic kidney disease 08/17/2016  . History of CHF (congestive heart failure) 05/28/2014  . Dependence on renal dialysis ([place]) 05/28/2014  . Bilateral cataracts 05/28/2014  . ESRD on dialysis ([place]) 11/16/2013  . Shortness of breath 10/31/2013  . CHF (congestive heart failure), NYHA class II ([place])  12/29/2012  . Essential hypertension, benign 12/29/2012  . Hypokalemia 12/29/2012  . Anemia 12/29/2012    Janene Harvey, PT, DPT 09/06/18 3:07 PM   [facility] [address]  [place], [place], [address] Phone: [phone]   Fax:  [phone]  Name: WAYLYN TENBRINK MRN: [id number] Date of Birth: Apr 07, 1966

## 2018-09-07 DIAGNOSIS — N186 End stage renal disease: Secondary | ICD-10-CM | POA: Diagnosis not present

## 2018-09-07 DIAGNOSIS — N2581 Secondary hyperparathyroidism of renal origin: Secondary | ICD-10-CM | POA: Diagnosis not present

## 2018-09-09 DIAGNOSIS — N186 End stage renal disease: Secondary | ICD-10-CM | POA: Diagnosis not present

## 2018-09-09 DIAGNOSIS — N2581 Secondary hyperparathyroidism of renal origin: Secondary | ICD-10-CM | POA: Diagnosis not present

## 2018-09-11 ENCOUNTER — Ambulatory Visit: Payer: Medicare Other | Admitting: Physical Therapy

## 2018-09-11 ENCOUNTER — Encounter: Payer: Self-pay | Admitting: Physical Therapy

## 2018-09-11 VITALS — BP 124/70 | HR 92

## 2018-09-11 DIAGNOSIS — R2681 Unsteadiness on feet: Secondary | ICD-10-CM

## 2018-09-11 DIAGNOSIS — R2689 Other abnormalities of gait and mobility: Secondary | ICD-10-CM | POA: Diagnosis not present

## 2018-09-11 DIAGNOSIS — R262 Difficulty in walking, not elsewhere classified: Secondary | ICD-10-CM | POA: Diagnosis not present

## 2018-09-11 DIAGNOSIS — M6281 Muscle weakness (generalized): Secondary | ICD-10-CM

## 2018-09-11 NOTE — Therapy (Signed)
[facility] [address]  [place], [place], [address] Phone: [phone]   Fax:  [phone]  Physical Therapy Treatment  Patient Details  Name: Bryan Wilkerson MRN: [id number] Date of Birth: May 13, 1966 Referring Provider (PT): Otelia Santee, MD   Encounter Date: 09/11/2018  PT End of Session - 09/11/18 1016    Visit Number  2    Number of Visits  17    Date for PT Re-Evaluation  11/01/18    Authorization Type  Medicare & UHC    PT Start Time  0939    PT Stop Time  1017    PT Time Calculation (min)  38 min    Equipment Utilized During Treatment  Oxygen   2L O2   Activity Tolerance  Patient tolerated treatment well   limited by dropping O2 levels   Behavior During Therapy  [facility] for tasks assessed/performed       Past Medical History:  Diagnosis Date  . Anemia   . CHF (congestive heart failure) ([place])   . Diabetic retinopathy ([place])   . ESRD on peritoneal dialysis ([place])    "7 days/week" (04/20/2017)  . Hypertension   . Pneumonia 2016; 04/19/2017  . Psoriasis   . Type I diabetes mellitus ([place])     Past Surgical History:  Procedure Laterality Date  . AV FISTULA PLACEMENT Right 12/05/2013   Procedure: RADIOCEPHALIC VS. BRACHIOCEPHALIC ARTERIOVENOUS (AV) FISTULA CREATION;  Surgeon: Conrad [place], MD;  Location: [facility];  Service: Vascular;  Laterality: Right;  . AV FISTULA PLACEMENT Left 07/20/2016   Procedure: LEFT ARM RADIOCEPHALIC ARTERIOVENOUS (AV) FISTULA CREATION;  Surgeon: Waynetta Sandy, MD;  Location: [facility];  Service: Vascular;  Laterality: Left;  . CATARACT EXTRACTION W/ INTRAOCULAR LENS  IMPLANT, BILATERAL Bilateral   . EYE SURGERY    . FISTULOGRAM Right 07/16/2014   Procedure: FISTULOGRAM;  Surgeon: Conrad [place]on, MD;  Location: [facility];  Service: Vascular;  Laterality: Right;  . IR THORACENTESIS ASP PLEURAL SPACE W/IMG GUIDE  05/02/2017  . LIGATION OF COMPETING BRANCHES OF ARTERIOVENOUS FISTULA Right  07/16/2014   Procedure: LIGATION OF COMPETING BRANCHES OF ARTERIOVENOUS FISTULA;  Surgeon: Conrad , MD;  Location: [facility];  Service: Vascular;  Laterality: Right;  . PERITONEAL CATHETER INSERTION Left ~ 08/2016  . PORT-A-CATH REMOVAL  2017  . PORTA CATH INSERTION Right    "for hemodialysis"  . RETINAL DETACHMENT SURGERY Right     Vitals:   09/11/18 0940 09/11/18 0956  BP: 124/70   Pulse:  92  SpO2:  93%    Subjective Assessment - 09/11/18 0943    Subjective  Reports he gave the HEP a try but still having some trouble with tandem walking.     Pertinent History  DM type I, psoriasis, HTN, ESRD on peritoneal dialysis, diabetic retinopathy, CHF, anemia, GERD, hx of seizures, MRSA positive 08/02/17    Patient Stated Goals  get back in shape, get strength back, get balance back    Currently in Pain?  No/denies                       [facility] Adult PT Treatment/Exercise - 09/11/18 0001      Exercises   Exercises  Knee/Hip      Knee/Hip Exercises: Aerobic   Nustep  L2 x 6 UEs/LEs   on 2L O2     Knee/Hip Exercises: Standing   Heel Raises  Both;1  set;15 reps    Heel Raises Limitations  heel/toe raise at treamill rail   unable to perform toe raise d/t weakness   Functional Squat  1 set;10 reps;Limitations    Functional Squat Limitations  at treadmill rail; cues to bring bottom back    Other Standing Knee Exercises  alt toe tap on treadmill 2x 30"    1 UE support     Knee/Hip Exercises: Seated   Other Seated Knee/Hip Exercises  R & L DF and PF with green TB x15 each    Other Seated Knee/Hip Exercises  R & L ankle inversion and eversion with red TB x15   heavy manual assistance to avoid hip rotation   Hamstring Curl  Strengthening;Right;Left;1 set;15 reps    Hamstring Limitations  blue TB             PT Education - 09/11/18 1016    Education Details  update to HEP; administered red and green TB    Person(s) Educated  Patient;Spouse    Methods   Explanation;Demonstration;Tactile cues;Verbal cues;Handout    Comprehension  Verbalized understanding;Returned demonstration       PT Short Term Goals - 09/11/18 1023      PT SHORT TERM GOAL #1   Title  Patient to be independent with initial HEP.    Time  4    Period  Weeks    Status  On-going    Target Date  10/04/18        PT Long Term Goals - 09/11/18 1023      PT LONG TERM GOAL #1   Title  Patient to be independent with advanced HEP.    Time  8    Period  Weeks    Status  On-going      PT LONG TERM GOAL #2   Title  Patient to demonstrate B LE strength >=4+/5.    Time  8    Period  Weeks    Status  On-going      PT LONG TERM GOAL #3   Title  Patient to score >19/24 on DGI in order to decrease risk of falls.     Time  8    Period  Weeks    Status  On-going      PT LONG TERM GOAL #4   Title  Patient to report 75% improvement in fatigue/SOB with climbing 16 steps without handrail at home.     Time  8    Period  Weeks    Status  On-going      PT LONG TERM GOAL #5   Title  Patient to report tolerance of 1 hour of standing/walking without fatigue limiting.     Time  8    Period  Weeks    Status  On-going            Plan - 09/11/18 1018    Clinical Impression Statement  Patient arrived to session late with wife, with supplemental O2 in hand. Unable to get O2 reading at beginning of session, however patient denying SOB or feeling unwell. Tolerated cardiovascular warm-up on 2L O2 without complaints. Continued rest of session with 2L O2, with vitals staying stable. Introduced 4 way ankle with patient requiring heavy manual assistance to avoid hip compensation. Able to tolerate standing ther-ex at treadmill with patient demonstrating difficulty with toe raises d/t weakness. Able to perform mini squats with cues to bring bottom back- patient demonstrating good carryover. Vitals WFL at end of  session and patient without complaints. Updated HPE with 4 way ankle- patient  and wife reported understanding.     Comorbidities  DM type I, psoriasis, HTN, ESRD on peritoneal dialysis, diabetic retinopathy, CHF, anemia, GERD, hx of seizures, MRSA positive 08/02/17    PT Treatment/Interventions  ADLs/Self Care Home Management;Cryotherapy;Electrical Stimulation;Functional mobility training;Stair training;Gait training;DME Instruction;Moist Heat;Therapeutic activities;Therapeutic exercise;Balance training;Neuromuscular re-education;Patient/family education;Passive range of motion;Manual techniques;Dry needling;Energy conservation;Splinting;Taping    PT Next Visit Plan  progress standing LE strengthening     Consulted and Agree with Plan of Care  Patient       Patient will benefit from skilled therapeutic intervention in order to improve the following deficits and impairments:     Visit Diagnosis: Muscle weakness (generalized)  Difficulty in walking, not elsewhere classified  Unsteadiness on feet  Other abnormalities of gait and mobility     Problem List Patient Active Problem List   Diagnosis Date Noted  . Restrictive lung disease 04/07/2018  . Impaired mobility and ADLs 03/09/2018  . Pulmonary hypertension, unspecified ([place]) 03/03/2018  . Fatigue 01/16/2018  . Transaminitis 12/30/2017  . Abdominal wall abscess 12/19/2017  . Peritonitis due to infected peritoneal dialysis catheter ([place]) 11/03/2017  . Uremia 08/25/2017  . Altered mental status 08/25/2017  . Sepsis ([place]) 08/02/2017  . Cellulitis and abscess of trunk 08/02/2017  . Diabetic polyneuropathy associated with type 2 diabetes mellitus ([place]) 06/05/2017  . Loculated pleural effusion 04/27/2017  . Pneumonia 04/19/2017  . Diabetes mellitus due to underlying condition, uncontrolled, with stage 4 chronic kidney disease, with long-term current use of insulin ([place]) 01/13/2017  . Seizure ([place]) 11/23/2016  . Hypocalcemia 11/23/2016  . Chronic kidney disease 08/17/2016  . History of CHF (congestive heart  failure) 05/28/2014  . Dependence on renal dialysis ([place]) 05/28/2014  . Bilateral cataracts 05/28/2014  . ESRD on dialysis ([place]) 11/16/2013  . Shortness of breath 10/31/2013  . CHF (congestive heart failure), NYHA class II ([place]) 12/29/2012  . Essential hypertension, benign 12/29/2012  . Hypokalemia 12/29/2012  . Anemia 12/29/2012     Janene Harvey, PT, DPT 09/11/18 10:25 AM   [facility] [address]  [place], [place], [address] Phone: [phone]   Fax:  [phone]  Name: Bryan Wilkerson MRN: [id number] Date of Birth: [birth year]

## 2018-09-12 DIAGNOSIS — N2581 Secondary hyperparathyroidism of renal origin: Secondary | ICD-10-CM | POA: Diagnosis not present

## 2018-09-12 DIAGNOSIS — N186 End stage renal disease: Secondary | ICD-10-CM | POA: Diagnosis not present

## 2018-09-13 ENCOUNTER — Ambulatory Visit: Payer: Medicare Other

## 2018-09-13 ENCOUNTER — Other Ambulatory Visit: Payer: Self-pay

## 2018-09-13 VITALS — BP 156/86 | HR 87

## 2018-09-13 DIAGNOSIS — M6281 Muscle weakness (generalized): Secondary | ICD-10-CM | POA: Diagnosis not present

## 2018-09-13 DIAGNOSIS — R2681 Unsteadiness on feet: Secondary | ICD-10-CM

## 2018-09-13 DIAGNOSIS — R2689 Other abnormalities of gait and mobility: Secondary | ICD-10-CM

## 2018-09-13 DIAGNOSIS — R262 Difficulty in walking, not elsewhere classified: Secondary | ICD-10-CM

## 2018-09-13 NOTE — Therapy (Signed)
[facility] [address]  [place], [place], [address] Phone: [phone]   Fax:  [phone]  Physical Therapy Treatment  Patient Details  Name: Bryan Wilkerson MRN: [id number] Date of Birth: 1966-04-24 Referring Provider (PT): Otelia Santee, MD   Encounter Date: 09/13/2018  PT End of Session - 09/13/18 1114    Visit Number  3    Number of Visits  17    Date for PT Re-Evaluation  11/01/18    Authorization Type  Medicare & UHC    PT Start Time  1104    PT Stop Time  1151    PT Time Calculation (min)  47 min    Equipment Utilized During Treatment  Oxygen   2L O2   Activity Tolerance  Patient tolerated treatment well   limited by dropping O2 levels   Behavior During Therapy  WFL for tasks assessed/performed       Past Medical History:  Diagnosis Date  . Anemia   . CHF (congestive heart failure) ([place])   . Diabetic retinopathy ([place])   . ESRD on peritoneal dialysis ([place])    "7 days/week" (04/20/2017)  . Hypertension   . Pneumonia 2016; 04/19/2017  . Psoriasis   . Type I diabetes mellitus ([place])     Past Surgical History:  Procedure Laterality Date  . AV FISTULA PLACEMENT Right 12/05/2013   Procedure: RADIOCEPHALIC VS. BRACHIOCEPHALIC ARTERIOVENOUS (AV) FISTULA CREATION;  Surgeon: Conrad [place], MD;  Location: [facility];  Service: Vascular;  Laterality: Right;  . AV FISTULA PLACEMENT Left 07/20/2016   Procedure: LEFT ARM RADIOCEPHALIC ARTERIOVENOUS (AV) FISTULA CREATION;  Surgeon: Waynetta Sandy, MD;  Location: [facility];  Service: Vascular;  Laterality: Left;  . CATARACT EXTRACTION W/ INTRAOCULAR LENS  IMPLANT, BILATERAL Bilateral   . EYE SURGERY    . FISTULOGRAM Right 07/16/2014   Procedure: FISTULOGRAM;  Surgeon: Conrad Holland, MD;  Location: [facility];  Service: Vascular;  Laterality: Right;  . IR THORACENTESIS ASP PLEURAL SPACE W/IMG GUIDE  05/02/2017  . LIGATION OF COMPETING BRANCHES OF ARTERIOVENOUS FISTULA  Right 07/16/2014   Procedure: LIGATION OF COMPETING BRANCHES OF ARTERIOVENOUS FISTULA;  Surgeon: Conrad [place], MD;  Location: [facility];  Service: Vascular;  Laterality: Right;  . PERITONEAL CATHETER INSERTION Left ~ 08/2016  . PORT-A-CATH REMOVAL  2017  . PORTA CATH INSERTION Right    "for hemodialysis"  . RETINAL DETACHMENT SURGERY Right     Vitals:   09/13/18 1138  BP: (!) 156/86  Pulse: 87  SpO2: 92%    Subjective Assessment - 09/13/18 1112    Subjective  Pt. reporting he is performing HEP daily.      Patient is accompained by:  Family member   Wife   Pertinent History  DM type I, psoriasis, HTN, ESRD on peritoneal dialysis, diabetic retinopathy, CHF, anemia, GERD, hx of seizures, MRSA positive 08/02/17    Patient Stated Goals  get back in shape, get strength back, get balance back    Currently in Pain?  No/denies    Multiple Pain Sites  No                       [facility] Adult PT Treatment/Exercise - 09/13/18 1112      Knee/Hip Exercises: Aerobic   Nustep  L3 x 6 UEs/LEs      Knee/Hip Exercises: Machines for Strengthening   Cybex Knee Extension  B LEs: 20# 2  x 15 reps     Cybex Knee Flexion  B LE's: 20# 2 x 15 reps     Cybex Leg Press  B LE's: 25# x 15 reps       Knee/Hip Exercises: Standing   Heel Raises  Both;20 reps    Forward Step Up  Right;Left;15 reps;1 set;Hand Hold: 1;Step Height: 6"    Forward Step Up Limitations  cues for slow eccentric step back     Functional Squat  1 set;15 reps;3 seconds    Functional Squat Limitations  to chair at counter                PT Short Term Goals - 09/11/18 1023      PT SHORT TERM GOAL #1   Title  Patient to be independent with initial HEP.    Time  4    Period  Weeks    Status  On-going    Target Date  10/04/18        PT Long Term Goals - 09/11/18 1023      PT LONG TERM GOAL #1   Title  Patient to be independent with advanced HEP.    Time  8    Period  Weeks    Status  On-going      PT LONG  TERM GOAL #2   Title  Patient to demonstrate B LE strength >=4+/5.    Time  8    Period  Weeks    Status  On-going      PT LONG TERM GOAL #3   Title  Patient to score >19/24 on DGI in order to decrease risk of falls.     Time  8    Period  Weeks    Status  On-going      PT LONG TERM GOAL #4   Title  Patient to report 75% improvement in fatigue/SOB with climbing 16 steps without handrail at home.     Time  8    Period  Weeks    Status  On-going      PT LONG TERM GOAL #5   Title  Patient to report tolerance of 1 hour of standing/walking without fatigue limiting.     Time  8    Period  Weeks    Status  On-going            Plan - 09/13/18 1117    Clinical Impression Statement  Pt. arrived to session on 2L of continues O2 with vitals stable throughout session.  Initial resting BP in seated somewhat elevated at 156/10mmHg however pt. noting MD wanting him to wait on BP meds today as he saw MD yesterday with low BP.  Pt. tolerated all LE strengthening activities well today without report of excessive LE fatigue or pain.  Tolerated addition of LE strengthening on machines well today.  Pt. noting he is performing HEP daily without issue.  Will continue to progress toward goals.      Personal Factors and Comorbidities  Past/Current Experience;Comorbidity 3+;Time since onset of injury/illness/exacerbation    Comorbidities  DM type I, psoriasis, HTN, ESRD on peritoneal dialysis, diabetic retinopathy, CHF, anemia, GERD, hx of seizures, MRSA positive 08/02/17    Examination-Activity Limitations  Carry;Squat;Stairs;Stand;Lift;Transfers;Locomotion Level    Examination-Participation Restrictions  Cleaning;Shop;Community Activity;Driving;Interpersonal Relationship;Yard Work;Laundry;Meal Prep    Stability/Clinical Decision Making  Unstable/Unpredictable    Rehab Potential  Good    PT Treatment/Interventions  ADLs/Self Care Home Management;Cryotherapy;Electrical Stimulation;Functional mobility  training;Stair training;Gait training;DME  Instruction;Moist Heat;Therapeutic activities;Therapeutic exercise;Balance training;Neuromuscular re-education;Patient/family education;Passive range of motion;Manual techniques;Dry needling;Energy conservation;Splinting;Taping    Consulted and Agree with Plan of Care  Patient       Patient will benefit from skilled therapeutic intervention in order to improve the following deficits and impairments:  Abnormal gait, Decreased endurance, Decreased activity tolerance, Decreased strength, Decreased balance, Difficulty walking, Improper body mechanics, Postural dysfunction  Visit Diagnosis: Muscle weakness (generalized)  Difficulty in walking, not elsewhere classified  Unsteadiness on feet  Other abnormalities of gait and mobility     Problem List Patient Active Problem List   Diagnosis Date Noted  . Restrictive lung disease 04/07/2018  . Impaired mobility and ADLs 03/09/2018  . Pulmonary hypertension, unspecified ([place]) 03/03/2018  . Fatigue 01/16/2018  . Transaminitis 12/30/2017  . Abdominal wall abscess 12/19/2017  . Peritonitis due to infected peritoneal dialysis catheter ([place]) 11/03/2017  . Uremia 08/25/2017  . Altered mental status 08/25/2017  . Sepsis ([place]) 08/02/2017  . Cellulitis and abscess of trunk 08/02/2017  . Diabetic polyneuropathy associated with type 2 diabetes mellitus ([place]) 06/05/2017  . Loculated pleural effusion 04/27/2017  . Pneumonia 04/19/2017  . Diabetes mellitus due to underlying condition, uncontrolled, with stage 4 chronic kidney disease, with long-term current use of insulin ([place]) 01/13/2017  . Seizure ([place]) 11/23/2016  . Hypocalcemia 11/23/2016  . Chronic kidney disease 08/17/2016  . History of CHF (congestive heart failure) 05/28/2014  . Dependence on renal dialysis ([place]) 05/28/2014  . Bilateral cataracts 05/28/2014  . ESRD on dialysis ([place]) 11/16/2013  . Shortness of breath 10/31/2013  . CHF (congestive  heart failure), NYHA class II ([place]) 12/29/2012  . Essential hypertension, benign 12/29/2012  . Hypokalemia 12/29/2012  . Anemia 12/29/2012    Bess Harvest, PTA 09/13/18 12:56 PM   [facility] [address]  [place], [place], [address] Phone: [phone]   Fax:  [phone]  Name: Bryan Wilkerson MRN: [id number] Date of Birth: 1966-03-05

## 2018-09-14 DIAGNOSIS — N2581 Secondary hyperparathyroidism of renal origin: Secondary | ICD-10-CM | POA: Diagnosis not present

## 2018-09-14 DIAGNOSIS — N186 End stage renal disease: Secondary | ICD-10-CM | POA: Diagnosis not present

## 2018-09-16 DIAGNOSIS — N2581 Secondary hyperparathyroidism of renal origin: Secondary | ICD-10-CM | POA: Diagnosis not present

## 2018-09-16 DIAGNOSIS — N186 End stage renal disease: Secondary | ICD-10-CM | POA: Diagnosis not present

## 2018-09-19 DIAGNOSIS — N2581 Secondary hyperparathyroidism of renal origin: Secondary | ICD-10-CM | POA: Diagnosis not present

## 2018-09-19 DIAGNOSIS — N186 End stage renal disease: Secondary | ICD-10-CM | POA: Diagnosis not present

## 2018-09-20 ENCOUNTER — Ambulatory Visit: Payer: Medicare Other | Admitting: Physical Therapy

## 2018-09-20 ENCOUNTER — Encounter: Payer: Self-pay | Admitting: Physical Therapy

## 2018-09-20 ENCOUNTER — Other Ambulatory Visit: Payer: Self-pay

## 2018-09-20 VITALS — BP 106/64 | HR 83

## 2018-09-20 DIAGNOSIS — M6281 Muscle weakness (generalized): Secondary | ICD-10-CM

## 2018-09-20 DIAGNOSIS — R262 Difficulty in walking, not elsewhere classified: Secondary | ICD-10-CM

## 2018-09-20 DIAGNOSIS — R2681 Unsteadiness on feet: Secondary | ICD-10-CM

## 2018-09-20 DIAGNOSIS — R2689 Other abnormalities of gait and mobility: Secondary | ICD-10-CM

## 2018-09-20 NOTE — Therapy (Addendum)
[facility] [address]  [place], [place], [address] Phone: [phone]   Fax:  [phone]  Physical Therapy Treatment  Patient Details  Name: Bryan Wilkerson MRN: [id number] Date of Birth: 1966/04/03 Referring Provider (PT): Otelia Santee, MD   Progress Note Reporting Period 09/06/18 to 09/20/18  See note below for Objective Data and Assessment of Progress/Goals.    Encounter Date: 09/20/2018  PT End of Session - 09/20/18 1146    Visit Number  4    Number of Visits  17    Date for PT Re-Evaluation  11/01/18    Authorization Type  Medicare & UHC    PT Start Time  1100    PT Stop Time  1146    PT Time Calculation (min)  46 min    Equipment Utilized During Treatment  Oxygen;Gait belt   2L O2   Activity Tolerance  Patient tolerated treatment well    Behavior During Therapy  WFL for tasks assessed/performed       Past Medical History:  Diagnosis Date  . Anemia   . CHF (congestive heart failure) ([place])   . Diabetic retinopathy ([place])   . ESRD on peritoneal dialysis ([place])    "7 days/week" (04/20/2017)  . Hypertension   . Pneumonia 2016; 04/19/2017  . Psoriasis   . Type I diabetes mellitus ([place])     Past Surgical History:  Procedure Laterality Date  . AV FISTULA PLACEMENT Right 12/05/2013   Procedure: RADIOCEPHALIC VS. BRACHIOCEPHALIC ARTERIOVENOUS (AV) FISTULA CREATION;  Surgeon: Conrad [place], MD;  Location: [facility];  Service: Vascular;  Laterality: Right;  . AV FISTULA PLACEMENT Left 07/20/2016   Procedure: LEFT ARM RADIOCEPHALIC ARTERIOVENOUS (AV) FISTULA CREATION;  Surgeon: Waynetta Sandy, MD;  Location: [facility];  Service: Vascular;  Laterality: Left;  . CATARACT EXTRACTION W/ INTRAOCULAR LENS  IMPLANT, BILATERAL Bilateral   . EYE SURGERY    . FISTULOGRAM Right 07/16/2014   Procedure: FISTULOGRAM;  Surgeon: Conrad [place], MD;  Location: [facility];  Service: Vascular;  Laterality: Right;  . IR  THORACENTESIS ASP PLEURAL SPACE W/IMG GUIDE  05/02/2017  . LIGATION OF COMPETING BRANCHES OF ARTERIOVENOUS FISTULA Right 07/16/2014   Procedure: LIGATION OF COMPETING BRANCHES OF ARTERIOVENOUS FISTULA;  Surgeon: Conrad [place], MD;  Location: [facility];  Service: Vascular;  Laterality: Right;  . PERITONEAL CATHETER INSERTION Left ~ 08/2016  . PORT-A-CATH REMOVAL  2017  . PORTA CATH INSERTION Right    "for hemodialysis"  . RETINAL DETACHMENT SURGERY Right     Vitals:   09/20/18 1100 09/20/18 1108 09/20/18 1134  BP: (!) 80/60 106/64   Pulse:  82 83  SpO2:  98% 97%    Subjective Assessment - 09/20/18 1108    Subjective  Patient reporting that he has been doing well. Has dialysis yesterday and notes that BP was low yesterday.    Patient is accompained by:  Family member   wife   Pertinent History  DM type I, psoriasis, HTN, ESRD on peritoneal dialysis, diabetic retinopathy, CHF, anemia, GERD, hx of seizures, MRSA positive 08/02/17    Patient Stated Goals  get back in shape, get strength back, get balance back    Currently in Pain?  No/denies                       [facility] Adult PT Treatment/Exercise - 09/20/18 0001      Neuro Re-ed  Neuro Re-ed Details   R & L tandem stance, tandem walking, [place] at counter top and with intermittent LOB but able to recover with min A      Knee/Hip Exercises: Stretches   Sports administrator  Right;Left;2 reps;30 seconds    Quad Stretch Limitations  prone with strap   cues for alignment     Knee/Hip Exercises: Aerobic   Nustep  L3 x 6 LEs only      Knee/Hip Exercises: Machines for Strengthening   Cybex Knee Extension  B LEs: 20# 15 reps     Cybex Knee Flexion  B LE's: 20# 15 reps     Cybex Leg Press  B LE's: 25# x 15 reps    cues to avoid valgus collapse     Knee/Hip Exercises: Standing   Wall Squat  10 reps;2 sets    Wall Squat Limitations  good form               PT Short Term Goals - 09/20/18 1149      PT SHORT TERM GOAL  #1   Title  Patient to be independent with initial HEP.    Time  4    Period  Weeks    Status  Achieved    Target Date  10/04/18        PT Long Term Goals - 09/11/18 1023      PT LONG TERM GOAL #1   Title  Patient to be independent with advanced HEP.    Time  8    Period  Weeks    Status  On-going      PT LONG TERM GOAL #2   Title  Patient to demonstrate B LE strength >=4+/5.    Time  8    Period  Weeks    Status  On-going      PT LONG TERM GOAL #3   Title  Patient to score >19/24 on DGI in order to decrease risk of falls.     Time  8    Period  Weeks    Status  On-going      PT LONG TERM GOAL #4   Title  Patient to report 75% improvement in fatigue/SOB with climbing 16 steps without handrail at home.     Time  8    Period  Weeks    Status  On-going      PT LONG TERM GOAL #5   Title  Patient to report tolerance of 1 hour of standing/walking without fatigue limiting.     Time  8    Period  Weeks    Status  On-going            Plan - 09/20/18 1149    Clinical Impression Statement  Patient arrived to session with wife. Reported that he had dialysis yesterday and his BP was low as a result. Difficult to get O2 reading at beginning of session, and BP slightly low but safe for therapy. Patient asymptomatic. Worked on progressive LE machine strengthening with patient demonstrating good tolerance and control throughout. Introduced wall squats with patient demonstrating good depth; reporting muscle burn and fatigue by the end of reps. Patient with tendency to push himself to the point of fatigue, however vitals stable throughout session. Worked on standing dynamic balance with cues to contact core and buttocks. Patient asymptomatic at end of session.     Comorbidities  DM type I, psoriasis, HTN, ESRD on peritoneal dialysis, diabetic retinopathy, CHF, anemia, GERD,  hx of seizures, MRSA positive 08/02/17    PT Treatment/Interventions  ADLs/Self Care Home  Management;Cryotherapy;Electrical Stimulation;Functional mobility training;Stair training;Gait training;DME Instruction;Moist Heat;Therapeutic activities;Therapeutic exercise;Balance training;Neuromuscular re-education;Patient/family education;Passive range of motion;Manual techniques;Dry needling;Energy conservation;Splinting;Taping    PT Next Visit Plan  progress standing LE strengthening     Consulted and Agree with Plan of Care  Patient       Patient will benefit from skilled therapeutic intervention in order to improve the following deficits and impairments:  Abnormal gait, Decreased endurance, Decreased activity tolerance, Decreased strength, Decreased balance, Difficulty walking, Improper body mechanics, Postural dysfunction  Visit Diagnosis: Muscle weakness (generalized)  Difficulty in walking, not elsewhere classified  Unsteadiness on feet  Other abnormalities of gait and mobility     Problem List Patient Active Problem List   Diagnosis Date Noted  . Restrictive lung disease 04/07/2018  . Impaired mobility and ADLs 03/09/2018  . Pulmonary hypertension, unspecified ([place]) 03/03/2018  . Fatigue 01/16/2018  . Transaminitis 12/30/2017  . Abdominal wall abscess 12/19/2017  . Peritonitis due to infected peritoneal dialysis catheter ([place]) 11/03/2017  . Uremia 08/25/2017  . Altered mental status 08/25/2017  . Sepsis ([place]) 08/02/2017  . Cellulitis and abscess of trunk 08/02/2017  . Diabetic polyneuropathy associated with type 2 diabetes mellitus ([place]) 06/05/2017  . Loculated pleural effusion 04/27/2017  . Pneumonia 04/19/2017  . Diabetes mellitus due to underlying condition, uncontrolled, with stage 4 chronic kidney disease, with long-term current use of insulin ([place]) 01/13/2017  . Seizure ([place]) 11/23/2016  . Hypocalcemia 11/23/2016  . Chronic kidney disease 08/17/2016  . History of CHF (congestive heart failure) 05/28/2014  . Dependence on renal dialysis ([place]) 05/28/2014  .  Bilateral cataracts 05/28/2014  . ESRD on dialysis ([place]) 11/16/2013  . Shortness of breath 10/31/2013  . CHF (congestive heart failure), NYHA class II ([place]) 12/29/2012  . Essential hypertension, benign 12/29/2012  . Hypokalemia 12/29/2012  . Anemia 12/29/2012     Janene Harvey, PT, DPT 09/20/18 11:51 AM   [facility] [address]  [place], [place], [address] Phone: [phone]   Fax:  [phone]  Name: Bryan Wilkerson MRN: [id number] Date of Birth: 05-Feb-1966   PHYSICAL THERAPY DISCHARGE SUMMARY  Visits from Start of Care: 4  Current functional level related to goals / functional outcomes: Unable to assess; patient did not return d/t concern about COVID-19   Remaining deficits: Unable to assess   Education / Equipment: HEP  Plan: Patient agrees to discharge.  Patient goals were not met. Patient is being discharged due to not returning since the last visit.  ?????     Janene Harvey, PT, DPT 11/02/18 10:22 AM

## 2018-09-21 DIAGNOSIS — N186 End stage renal disease: Secondary | ICD-10-CM | POA: Diagnosis not present

## 2018-09-21 DIAGNOSIS — N2581 Secondary hyperparathyroidism of renal origin: Secondary | ICD-10-CM | POA: Diagnosis not present

## 2018-09-23 DIAGNOSIS — N186 End stage renal disease: Secondary | ICD-10-CM | POA: Diagnosis not present

## 2018-09-23 DIAGNOSIS — N2581 Secondary hyperparathyroidism of renal origin: Secondary | ICD-10-CM | POA: Diagnosis not present

## 2018-09-25 ENCOUNTER — Ambulatory Visit: Payer: Medicare Other

## 2018-09-26 DIAGNOSIS — N186 End stage renal disease: Secondary | ICD-10-CM | POA: Diagnosis not present

## 2018-09-26 DIAGNOSIS — N2581 Secondary hyperparathyroidism of renal origin: Secondary | ICD-10-CM | POA: Diagnosis not present

## 2018-09-27 ENCOUNTER — Encounter: Payer: Medicare Other | Admitting: Physical Therapy

## 2018-09-28 DIAGNOSIS — N186 End stage renal disease: Secondary | ICD-10-CM | POA: Diagnosis not present

## 2018-09-28 DIAGNOSIS — N2581 Secondary hyperparathyroidism of renal origin: Secondary | ICD-10-CM | POA: Diagnosis not present

## 2018-09-30 DIAGNOSIS — N186 End stage renal disease: Secondary | ICD-10-CM | POA: Diagnosis not present

## 2018-09-30 DIAGNOSIS — N2581 Secondary hyperparathyroidism of renal origin: Secondary | ICD-10-CM | POA: Diagnosis not present

## 2018-10-03 DIAGNOSIS — N186 End stage renal disease: Secondary | ICD-10-CM | POA: Diagnosis not present

## 2018-10-03 DIAGNOSIS — N2581 Secondary hyperparathyroidism of renal origin: Secondary | ICD-10-CM | POA: Diagnosis not present

## 2018-10-04 ENCOUNTER — Ambulatory Visit: Payer: 59

## 2018-10-04 DIAGNOSIS — N186 End stage renal disease: Secondary | ICD-10-CM | POA: Diagnosis not present

## 2018-10-04 DIAGNOSIS — Z992 Dependence on renal dialysis: Secondary | ICD-10-CM | POA: Diagnosis not present

## 2018-10-04 DIAGNOSIS — E1129 Type 2 diabetes mellitus with other diabetic kidney complication: Secondary | ICD-10-CM | POA: Diagnosis not present

## 2018-10-05 DIAGNOSIS — N2581 Secondary hyperparathyroidism of renal origin: Secondary | ICD-10-CM | POA: Diagnosis not present

## 2018-10-05 DIAGNOSIS — E1122 Type 2 diabetes mellitus with diabetic chronic kidney disease: Secondary | ICD-10-CM | POA: Diagnosis not present

## 2018-10-05 DIAGNOSIS — N186 End stage renal disease: Secondary | ICD-10-CM | POA: Diagnosis not present

## 2018-10-06 DIAGNOSIS — I871 Compression of vein: Secondary | ICD-10-CM | POA: Diagnosis not present

## 2018-10-06 DIAGNOSIS — T82858A Stenosis of vascular prosthetic devices, implants and grafts, initial encounter: Secondary | ICD-10-CM | POA: Diagnosis not present

## 2018-10-06 DIAGNOSIS — N186 End stage renal disease: Secondary | ICD-10-CM | POA: Diagnosis not present

## 2018-10-06 DIAGNOSIS — Z992 Dependence on renal dialysis: Secondary | ICD-10-CM | POA: Diagnosis not present

## 2018-10-07 DIAGNOSIS — N2581 Secondary hyperparathyroidism of renal origin: Secondary | ICD-10-CM | POA: Diagnosis not present

## 2018-10-07 DIAGNOSIS — E1122 Type 2 diabetes mellitus with diabetic chronic kidney disease: Secondary | ICD-10-CM | POA: Diagnosis not present

## 2018-10-07 DIAGNOSIS — N186 End stage renal disease: Secondary | ICD-10-CM | POA: Diagnosis not present

## 2018-10-10 DIAGNOSIS — N186 End stage renal disease: Secondary | ICD-10-CM | POA: Diagnosis not present

## 2018-10-10 DIAGNOSIS — E1122 Type 2 diabetes mellitus with diabetic chronic kidney disease: Secondary | ICD-10-CM | POA: Diagnosis not present

## 2018-10-10 DIAGNOSIS — N2581 Secondary hyperparathyroidism of renal origin: Secondary | ICD-10-CM | POA: Diagnosis not present

## 2018-10-12 DIAGNOSIS — E1122 Type 2 diabetes mellitus with diabetic chronic kidney disease: Secondary | ICD-10-CM | POA: Diagnosis not present

## 2018-10-12 DIAGNOSIS — N186 End stage renal disease: Secondary | ICD-10-CM | POA: Diagnosis not present

## 2018-10-12 DIAGNOSIS — N2581 Secondary hyperparathyroidism of renal origin: Secondary | ICD-10-CM | POA: Diagnosis not present

## 2018-10-14 DIAGNOSIS — N186 End stage renal disease: Secondary | ICD-10-CM | POA: Diagnosis not present

## 2018-10-14 DIAGNOSIS — E1122 Type 2 diabetes mellitus with diabetic chronic kidney disease: Secondary | ICD-10-CM | POA: Diagnosis not present

## 2018-10-14 DIAGNOSIS — N2581 Secondary hyperparathyroidism of renal origin: Secondary | ICD-10-CM | POA: Diagnosis not present

## 2018-10-17 DIAGNOSIS — N186 End stage renal disease: Secondary | ICD-10-CM | POA: Diagnosis not present

## 2018-10-17 DIAGNOSIS — E1122 Type 2 diabetes mellitus with diabetic chronic kidney disease: Secondary | ICD-10-CM | POA: Diagnosis not present

## 2018-10-17 DIAGNOSIS — N2581 Secondary hyperparathyroidism of renal origin: Secondary | ICD-10-CM | POA: Diagnosis not present

## 2018-10-19 DIAGNOSIS — E1122 Type 2 diabetes mellitus with diabetic chronic kidney disease: Secondary | ICD-10-CM | POA: Diagnosis not present

## 2018-10-19 DIAGNOSIS — N2581 Secondary hyperparathyroidism of renal origin: Secondary | ICD-10-CM | POA: Diagnosis not present

## 2018-10-19 DIAGNOSIS — N186 End stage renal disease: Secondary | ICD-10-CM | POA: Diagnosis not present

## 2018-10-21 DIAGNOSIS — N186 End stage renal disease: Secondary | ICD-10-CM | POA: Diagnosis not present

## 2018-10-21 DIAGNOSIS — N2581 Secondary hyperparathyroidism of renal origin: Secondary | ICD-10-CM | POA: Diagnosis not present

## 2018-10-21 DIAGNOSIS — E1122 Type 2 diabetes mellitus with diabetic chronic kidney disease: Secondary | ICD-10-CM | POA: Diagnosis not present

## 2018-10-24 DIAGNOSIS — N2581 Secondary hyperparathyroidism of renal origin: Secondary | ICD-10-CM | POA: Diagnosis not present

## 2018-10-24 DIAGNOSIS — N186 End stage renal disease: Secondary | ICD-10-CM | POA: Diagnosis not present

## 2018-10-24 DIAGNOSIS — E1122 Type 2 diabetes mellitus with diabetic chronic kidney disease: Secondary | ICD-10-CM | POA: Diagnosis not present

## 2018-10-26 DIAGNOSIS — L408 Other psoriasis: Secondary | ICD-10-CM | POA: Diagnosis not present

## 2018-10-26 DIAGNOSIS — N186 End stage renal disease: Secondary | ICD-10-CM | POA: Diagnosis not present

## 2018-10-26 DIAGNOSIS — Z79899 Other long term (current) drug therapy: Secondary | ICD-10-CM | POA: Diagnosis not present

## 2018-10-26 DIAGNOSIS — N2581 Secondary hyperparathyroidism of renal origin: Secondary | ICD-10-CM | POA: Diagnosis not present

## 2018-10-26 DIAGNOSIS — E1122 Type 2 diabetes mellitus with diabetic chronic kidney disease: Secondary | ICD-10-CM | POA: Diagnosis not present

## 2018-10-26 IMAGING — CR DG CHEST 2V
2 series · 2 of 2 positions shown · non-contrast
Comparison: 07/14/2017

CLINICAL DATA: RLQ abdominal pain and knot in groin. Pt is on daily
dialysis and has h/o CHF, PNA, DM.

EXAM:
CHEST  2 VIEW

[w chest pa]
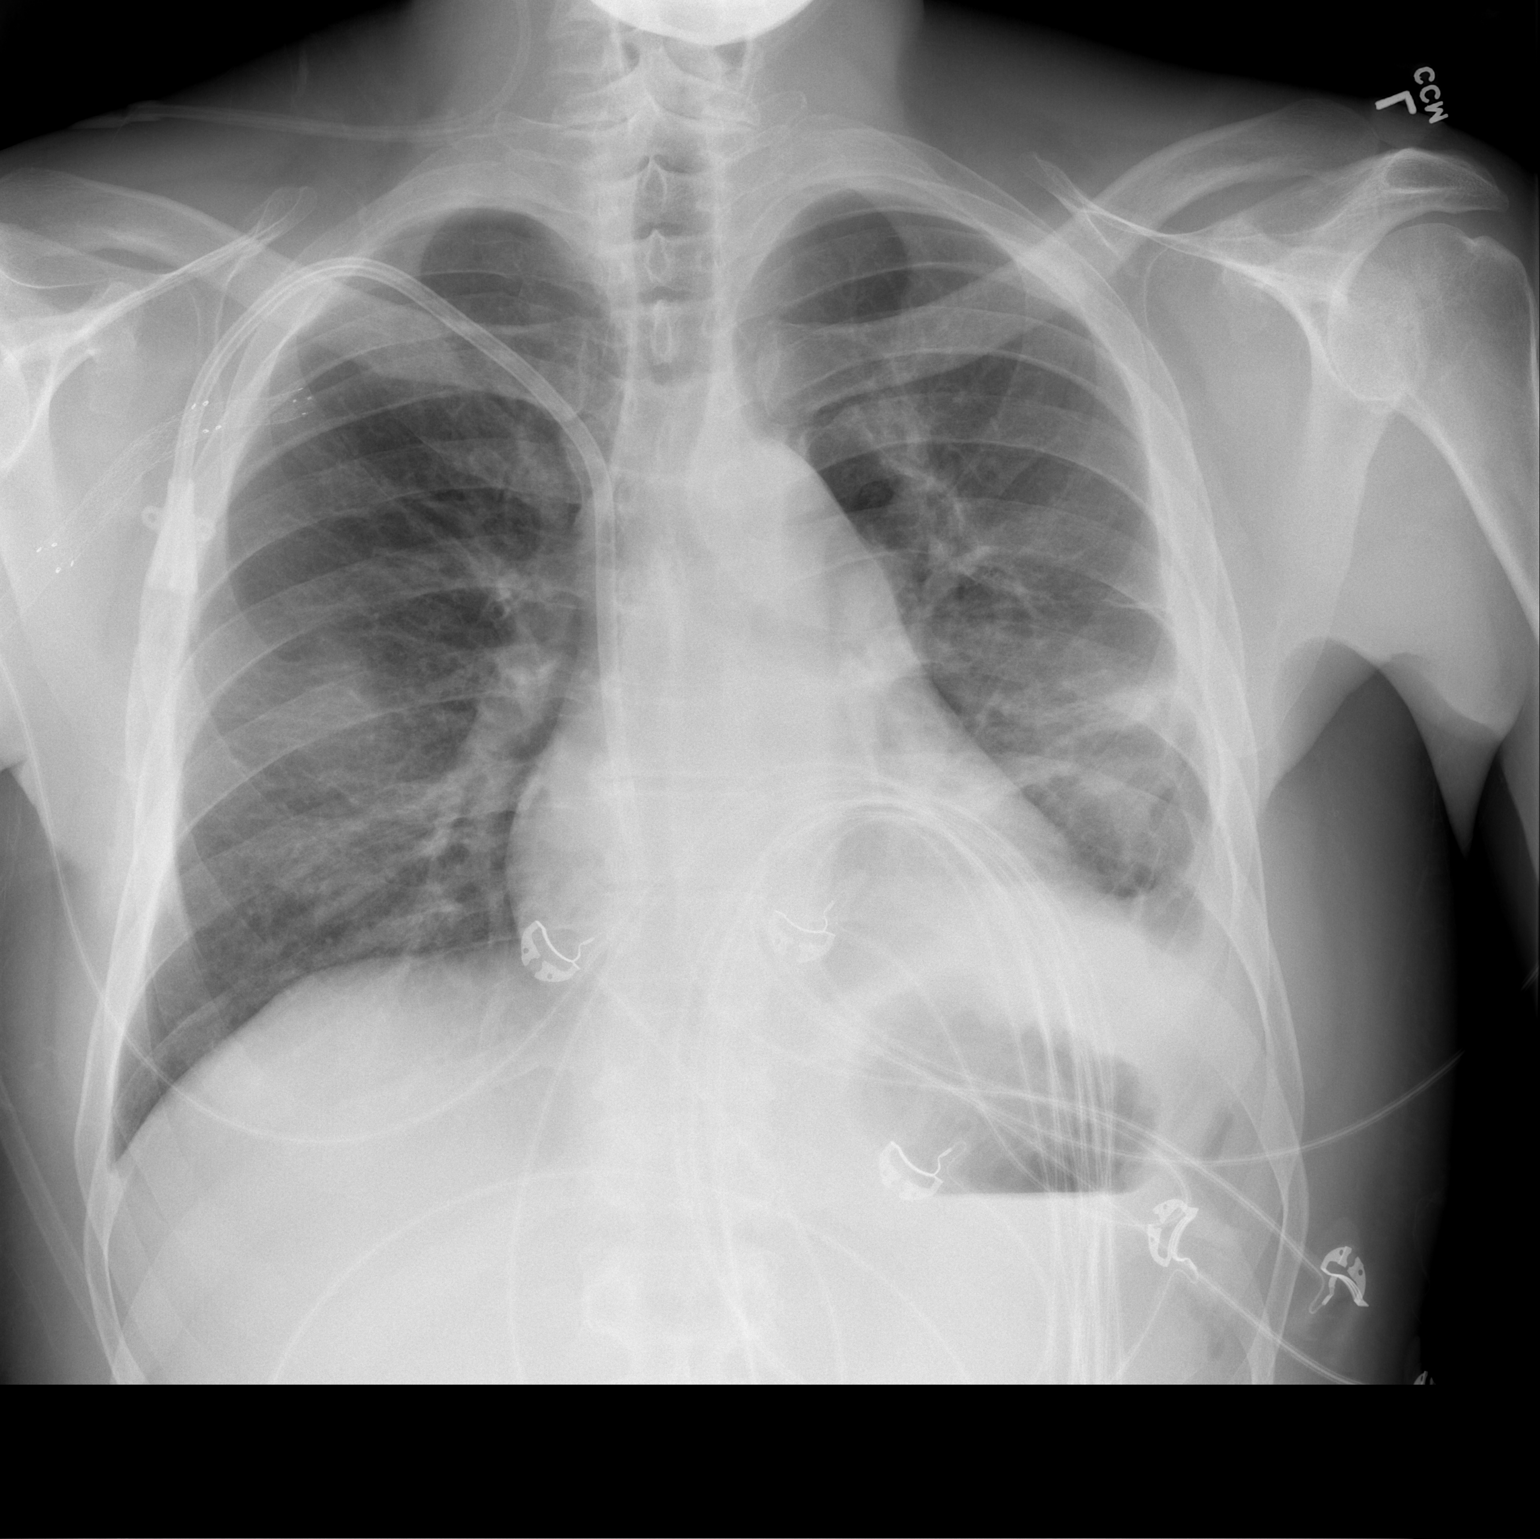

[w chest lat]
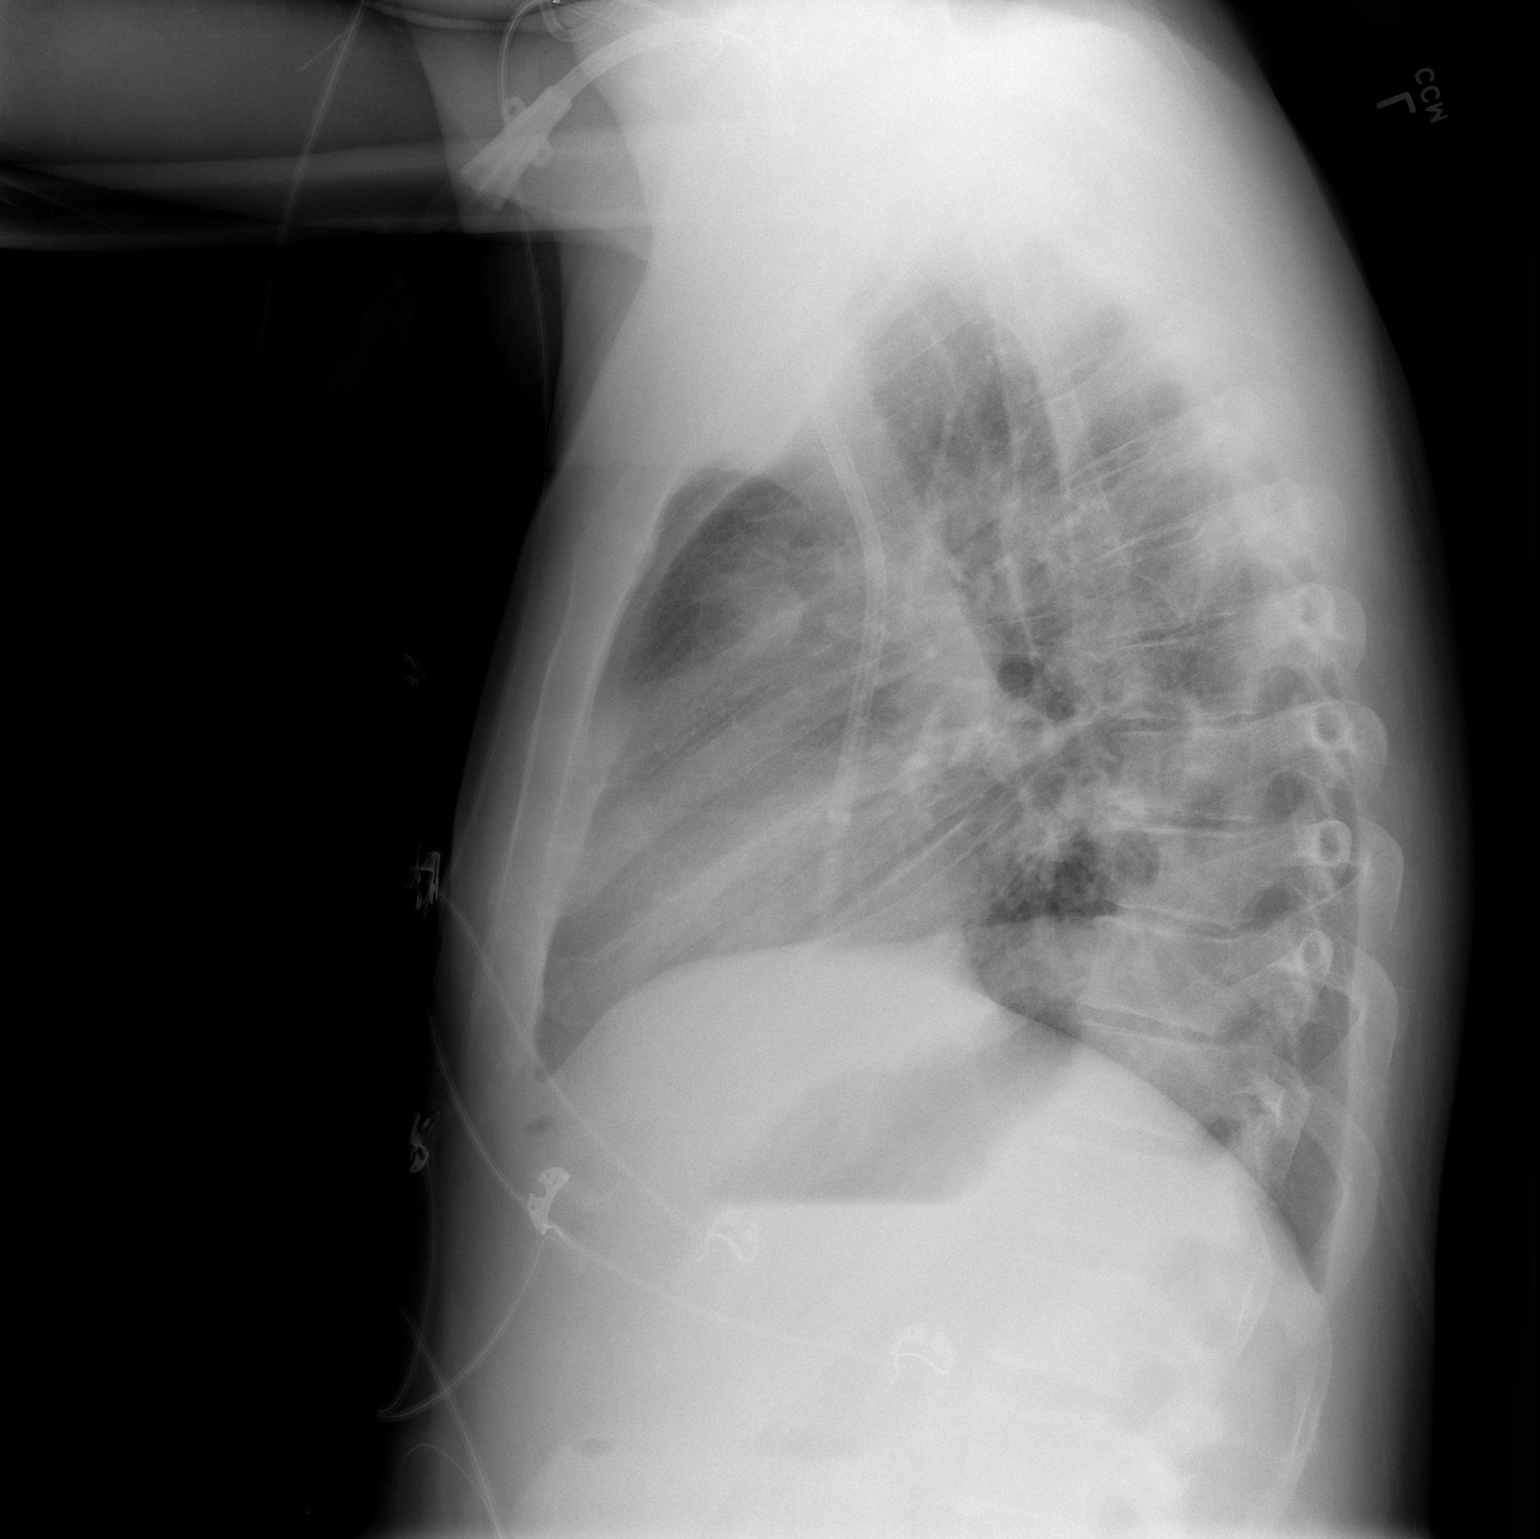

[2 of 2 positions shown; findings below may reference images not displayed]

FINDINGS: Cardiac silhouette is normal in size. No mediastinal or hilar
masses. No convincing adenopathy.

Right-sided tunneled dual lumen central venous catheter is stable,
distal tip in the right atrium.

Left pleural effusion and left mid to lower lung zones scarring is
stable from the prior exam. Lungs otherwise clear with no evidence
to suggest pneumonia or pulmonary edema. No right pleural effusion.
No pneumothorax.

Skeletal structures are intact.
IMPRESSION: No acute cardiopulmonary disease. Stable chronic changes in the left
lung and at the left lung base, with a chronic left pleural
effusion.

## 2018-10-28 DIAGNOSIS — N2581 Secondary hyperparathyroidism of renal origin: Secondary | ICD-10-CM | POA: Diagnosis not present

## 2018-10-28 DIAGNOSIS — N186 End stage renal disease: Secondary | ICD-10-CM | POA: Diagnosis not present

## 2018-10-28 DIAGNOSIS — E1122 Type 2 diabetes mellitus with diabetic chronic kidney disease: Secondary | ICD-10-CM | POA: Diagnosis not present

## 2018-10-31 DIAGNOSIS — N2581 Secondary hyperparathyroidism of renal origin: Secondary | ICD-10-CM | POA: Diagnosis not present

## 2018-10-31 DIAGNOSIS — E1122 Type 2 diabetes mellitus with diabetic chronic kidney disease: Secondary | ICD-10-CM | POA: Diagnosis not present

## 2018-10-31 DIAGNOSIS — N186 End stage renal disease: Secondary | ICD-10-CM | POA: Diagnosis not present

## 2018-11-02 DIAGNOSIS — E1122 Type 2 diabetes mellitus with diabetic chronic kidney disease: Secondary | ICD-10-CM | POA: Diagnosis not present

## 2018-11-02 DIAGNOSIS — N2581 Secondary hyperparathyroidism of renal origin: Secondary | ICD-10-CM | POA: Diagnosis not present

## 2018-11-02 DIAGNOSIS — N186 End stage renal disease: Secondary | ICD-10-CM | POA: Diagnosis not present

## 2018-11-03 DIAGNOSIS — N186 End stage renal disease: Secondary | ICD-10-CM | POA: Diagnosis not present

## 2018-11-03 DIAGNOSIS — Z992 Dependence on renal dialysis: Secondary | ICD-10-CM | POA: Diagnosis not present

## 2018-11-03 DIAGNOSIS — E1129 Type 2 diabetes mellitus with other diabetic kidney complication: Secondary | ICD-10-CM | POA: Diagnosis not present

## 2018-11-04 DIAGNOSIS — E1122 Type 2 diabetes mellitus with diabetic chronic kidney disease: Secondary | ICD-10-CM | POA: Diagnosis not present

## 2018-11-04 DIAGNOSIS — N2581 Secondary hyperparathyroidism of renal origin: Secondary | ICD-10-CM | POA: Diagnosis not present

## 2018-11-04 DIAGNOSIS — N186 End stage renal disease: Secondary | ICD-10-CM | POA: Diagnosis not present

## 2018-11-04 DIAGNOSIS — D631 Anemia in chronic kidney disease: Secondary | ICD-10-CM | POA: Diagnosis not present

## 2018-11-07 DIAGNOSIS — D631 Anemia in chronic kidney disease: Secondary | ICD-10-CM | POA: Diagnosis not present

## 2018-11-07 DIAGNOSIS — N2581 Secondary hyperparathyroidism of renal origin: Secondary | ICD-10-CM | POA: Diagnosis not present

## 2018-11-07 DIAGNOSIS — N186 End stage renal disease: Secondary | ICD-10-CM | POA: Diagnosis not present

## 2018-11-07 DIAGNOSIS — E1122 Type 2 diabetes mellitus with diabetic chronic kidney disease: Secondary | ICD-10-CM | POA: Diagnosis not present

## 2018-11-09 DIAGNOSIS — E1122 Type 2 diabetes mellitus with diabetic chronic kidney disease: Secondary | ICD-10-CM | POA: Diagnosis not present

## 2018-11-09 DIAGNOSIS — N2581 Secondary hyperparathyroidism of renal origin: Secondary | ICD-10-CM | POA: Diagnosis not present

## 2018-11-09 DIAGNOSIS — D631 Anemia in chronic kidney disease: Secondary | ICD-10-CM | POA: Diagnosis not present

## 2018-11-09 DIAGNOSIS — N186 End stage renal disease: Secondary | ICD-10-CM | POA: Diagnosis not present

## 2018-11-11 DIAGNOSIS — E1122 Type 2 diabetes mellitus with diabetic chronic kidney disease: Secondary | ICD-10-CM | POA: Diagnosis not present

## 2018-11-11 DIAGNOSIS — N2581 Secondary hyperparathyroidism of renal origin: Secondary | ICD-10-CM | POA: Diagnosis not present

## 2018-11-11 DIAGNOSIS — D631 Anemia in chronic kidney disease: Secondary | ICD-10-CM | POA: Diagnosis not present

## 2018-11-11 DIAGNOSIS — N186 End stage renal disease: Secondary | ICD-10-CM | POA: Diagnosis not present

## 2018-11-14 DIAGNOSIS — N186 End stage renal disease: Secondary | ICD-10-CM | POA: Diagnosis not present

## 2018-11-14 DIAGNOSIS — D631 Anemia in chronic kidney disease: Secondary | ICD-10-CM | POA: Diagnosis not present

## 2018-11-14 DIAGNOSIS — N2581 Secondary hyperparathyroidism of renal origin: Secondary | ICD-10-CM | POA: Diagnosis not present

## 2018-11-14 DIAGNOSIS — E1122 Type 2 diabetes mellitus with diabetic chronic kidney disease: Secondary | ICD-10-CM | POA: Diagnosis not present

## 2018-11-16 DIAGNOSIS — D631 Anemia in chronic kidney disease: Secondary | ICD-10-CM | POA: Diagnosis not present

## 2018-11-16 DIAGNOSIS — E1122 Type 2 diabetes mellitus with diabetic chronic kidney disease: Secondary | ICD-10-CM | POA: Diagnosis not present

## 2018-11-16 DIAGNOSIS — N2581 Secondary hyperparathyroidism of renal origin: Secondary | ICD-10-CM | POA: Diagnosis not present

## 2018-11-16 DIAGNOSIS — N186 End stage renal disease: Secondary | ICD-10-CM | POA: Diagnosis not present

## 2018-11-18 DIAGNOSIS — N186 End stage renal disease: Secondary | ICD-10-CM | POA: Diagnosis not present

## 2018-11-18 DIAGNOSIS — D631 Anemia in chronic kidney disease: Secondary | ICD-10-CM | POA: Diagnosis not present

## 2018-11-18 DIAGNOSIS — E1122 Type 2 diabetes mellitus with diabetic chronic kidney disease: Secondary | ICD-10-CM | POA: Diagnosis not present

## 2018-11-18 DIAGNOSIS — N2581 Secondary hyperparathyroidism of renal origin: Secondary | ICD-10-CM | POA: Diagnosis not present

## 2018-11-21 DIAGNOSIS — E1122 Type 2 diabetes mellitus with diabetic chronic kidney disease: Secondary | ICD-10-CM | POA: Diagnosis not present

## 2018-11-21 DIAGNOSIS — N2581 Secondary hyperparathyroidism of renal origin: Secondary | ICD-10-CM | POA: Diagnosis not present

## 2018-11-21 DIAGNOSIS — N186 End stage renal disease: Secondary | ICD-10-CM | POA: Diagnosis not present

## 2018-11-21 DIAGNOSIS — D631 Anemia in chronic kidney disease: Secondary | ICD-10-CM | POA: Diagnosis not present

## 2018-11-23 DIAGNOSIS — N186 End stage renal disease: Secondary | ICD-10-CM | POA: Diagnosis not present

## 2018-11-23 DIAGNOSIS — E1122 Type 2 diabetes mellitus with diabetic chronic kidney disease: Secondary | ICD-10-CM | POA: Diagnosis not present

## 2018-11-23 DIAGNOSIS — N2581 Secondary hyperparathyroidism of renal origin: Secondary | ICD-10-CM | POA: Diagnosis not present

## 2018-11-23 DIAGNOSIS — D631 Anemia in chronic kidney disease: Secondary | ICD-10-CM | POA: Diagnosis not present

## 2018-11-25 DIAGNOSIS — N186 End stage renal disease: Secondary | ICD-10-CM | POA: Diagnosis not present

## 2018-11-25 DIAGNOSIS — D631 Anemia in chronic kidney disease: Secondary | ICD-10-CM | POA: Diagnosis not present

## 2018-11-25 DIAGNOSIS — E1122 Type 2 diabetes mellitus with diabetic chronic kidney disease: Secondary | ICD-10-CM | POA: Diagnosis not present

## 2018-11-25 DIAGNOSIS — N2581 Secondary hyperparathyroidism of renal origin: Secondary | ICD-10-CM | POA: Diagnosis not present

## 2018-11-28 DIAGNOSIS — E1122 Type 2 diabetes mellitus with diabetic chronic kidney disease: Secondary | ICD-10-CM | POA: Diagnosis not present

## 2018-11-28 DIAGNOSIS — N2581 Secondary hyperparathyroidism of renal origin: Secondary | ICD-10-CM | POA: Diagnosis not present

## 2018-11-28 DIAGNOSIS — D631 Anemia in chronic kidney disease: Secondary | ICD-10-CM | POA: Diagnosis not present

## 2018-11-28 DIAGNOSIS — N186 End stage renal disease: Secondary | ICD-10-CM | POA: Diagnosis not present

## 2018-11-30 DIAGNOSIS — N2581 Secondary hyperparathyroidism of renal origin: Secondary | ICD-10-CM | POA: Diagnosis not present

## 2018-11-30 DIAGNOSIS — E1122 Type 2 diabetes mellitus with diabetic chronic kidney disease: Secondary | ICD-10-CM | POA: Diagnosis not present

## 2018-11-30 DIAGNOSIS — D631 Anemia in chronic kidney disease: Secondary | ICD-10-CM | POA: Diagnosis not present

## 2018-11-30 DIAGNOSIS — N186 End stage renal disease: Secondary | ICD-10-CM | POA: Diagnosis not present

## 2018-12-02 DIAGNOSIS — E1122 Type 2 diabetes mellitus with diabetic chronic kidney disease: Secondary | ICD-10-CM | POA: Diagnosis not present

## 2018-12-02 DIAGNOSIS — D631 Anemia in chronic kidney disease: Secondary | ICD-10-CM | POA: Diagnosis not present

## 2018-12-02 DIAGNOSIS — N2581 Secondary hyperparathyroidism of renal origin: Secondary | ICD-10-CM | POA: Diagnosis not present

## 2018-12-02 DIAGNOSIS — N186 End stage renal disease: Secondary | ICD-10-CM | POA: Diagnosis not present

## 2018-12-04 DIAGNOSIS — N186 End stage renal disease: Secondary | ICD-10-CM | POA: Diagnosis not present

## 2018-12-04 DIAGNOSIS — Z992 Dependence on renal dialysis: Secondary | ICD-10-CM | POA: Diagnosis not present

## 2018-12-04 DIAGNOSIS — E1129 Type 2 diabetes mellitus with other diabetic kidney complication: Secondary | ICD-10-CM | POA: Diagnosis not present

## 2018-12-05 DIAGNOSIS — N2581 Secondary hyperparathyroidism of renal origin: Secondary | ICD-10-CM | POA: Diagnosis not present

## 2018-12-05 DIAGNOSIS — N186 End stage renal disease: Secondary | ICD-10-CM | POA: Diagnosis not present

## 2018-12-05 DIAGNOSIS — E1122 Type 2 diabetes mellitus with diabetic chronic kidney disease: Secondary | ICD-10-CM | POA: Diagnosis not present

## 2018-12-07 DIAGNOSIS — E1122 Type 2 diabetes mellitus with diabetic chronic kidney disease: Secondary | ICD-10-CM | POA: Diagnosis not present

## 2018-12-07 DIAGNOSIS — N186 End stage renal disease: Secondary | ICD-10-CM | POA: Diagnosis not present

## 2018-12-07 DIAGNOSIS — N2581 Secondary hyperparathyroidism of renal origin: Secondary | ICD-10-CM | POA: Diagnosis not present

## 2018-12-09 DIAGNOSIS — N2581 Secondary hyperparathyroidism of renal origin: Secondary | ICD-10-CM | POA: Diagnosis not present

## 2018-12-09 DIAGNOSIS — E1122 Type 2 diabetes mellitus with diabetic chronic kidney disease: Secondary | ICD-10-CM | POA: Diagnosis not present

## 2018-12-09 DIAGNOSIS — N186 End stage renal disease: Secondary | ICD-10-CM | POA: Diagnosis not present

## 2018-12-12 DIAGNOSIS — E1122 Type 2 diabetes mellitus with diabetic chronic kidney disease: Secondary | ICD-10-CM | POA: Diagnosis not present

## 2018-12-12 DIAGNOSIS — N2581 Secondary hyperparathyroidism of renal origin: Secondary | ICD-10-CM | POA: Diagnosis not present

## 2018-12-12 DIAGNOSIS — N186 End stage renal disease: Secondary | ICD-10-CM | POA: Diagnosis not present

## 2018-12-14 DIAGNOSIS — N186 End stage renal disease: Secondary | ICD-10-CM | POA: Diagnosis not present

## 2018-12-14 DIAGNOSIS — E1122 Type 2 diabetes mellitus with diabetic chronic kidney disease: Secondary | ICD-10-CM | POA: Diagnosis not present

## 2018-12-14 DIAGNOSIS — N2581 Secondary hyperparathyroidism of renal origin: Secondary | ICD-10-CM | POA: Diagnosis not present

## 2018-12-16 DIAGNOSIS — E1122 Type 2 diabetes mellitus with diabetic chronic kidney disease: Secondary | ICD-10-CM | POA: Diagnosis not present

## 2018-12-16 DIAGNOSIS — N186 End stage renal disease: Secondary | ICD-10-CM | POA: Diagnosis not present

## 2018-12-16 DIAGNOSIS — N2581 Secondary hyperparathyroidism of renal origin: Secondary | ICD-10-CM | POA: Diagnosis not present

## 2018-12-19 DIAGNOSIS — E1122 Type 2 diabetes mellitus with diabetic chronic kidney disease: Secondary | ICD-10-CM | POA: Diagnosis not present

## 2018-12-19 DIAGNOSIS — N2581 Secondary hyperparathyroidism of renal origin: Secondary | ICD-10-CM | POA: Diagnosis not present

## 2018-12-19 DIAGNOSIS — N186 End stage renal disease: Secondary | ICD-10-CM | POA: Diagnosis not present

## 2018-12-21 DIAGNOSIS — N186 End stage renal disease: Secondary | ICD-10-CM | POA: Diagnosis not present

## 2018-12-21 DIAGNOSIS — N2581 Secondary hyperparathyroidism of renal origin: Secondary | ICD-10-CM | POA: Diagnosis not present

## 2018-12-21 DIAGNOSIS — E1122 Type 2 diabetes mellitus with diabetic chronic kidney disease: Secondary | ICD-10-CM | POA: Diagnosis not present

## 2018-12-23 DIAGNOSIS — N2581 Secondary hyperparathyroidism of renal origin: Secondary | ICD-10-CM | POA: Diagnosis not present

## 2018-12-23 DIAGNOSIS — E1122 Type 2 diabetes mellitus with diabetic chronic kidney disease: Secondary | ICD-10-CM | POA: Diagnosis not present

## 2018-12-23 DIAGNOSIS — N186 End stage renal disease: Secondary | ICD-10-CM | POA: Diagnosis not present

## 2018-12-26 DIAGNOSIS — E1122 Type 2 diabetes mellitus with diabetic chronic kidney disease: Secondary | ICD-10-CM | POA: Diagnosis not present

## 2018-12-26 DIAGNOSIS — N2581 Secondary hyperparathyroidism of renal origin: Secondary | ICD-10-CM | POA: Diagnosis not present

## 2018-12-26 DIAGNOSIS — N186 End stage renal disease: Secondary | ICD-10-CM | POA: Diagnosis not present

## 2018-12-28 DIAGNOSIS — E1122 Type 2 diabetes mellitus with diabetic chronic kidney disease: Secondary | ICD-10-CM | POA: Diagnosis not present

## 2018-12-28 DIAGNOSIS — N2581 Secondary hyperparathyroidism of renal origin: Secondary | ICD-10-CM | POA: Diagnosis not present

## 2018-12-28 DIAGNOSIS — N186 End stage renal disease: Secondary | ICD-10-CM | POA: Diagnosis not present

## 2018-12-30 DIAGNOSIS — N186 End stage renal disease: Secondary | ICD-10-CM | POA: Diagnosis not present

## 2018-12-30 DIAGNOSIS — E1122 Type 2 diabetes mellitus with diabetic chronic kidney disease: Secondary | ICD-10-CM | POA: Diagnosis not present

## 2018-12-30 DIAGNOSIS — N2581 Secondary hyperparathyroidism of renal origin: Secondary | ICD-10-CM | POA: Diagnosis not present

## 2019-01-01 DIAGNOSIS — Z992 Dependence on renal dialysis: Secondary | ICD-10-CM | POA: Diagnosis not present

## 2019-01-01 DIAGNOSIS — N186 End stage renal disease: Secondary | ICD-10-CM | POA: Diagnosis not present

## 2019-01-01 DIAGNOSIS — T82858A Stenosis of vascular prosthetic devices, implants and grafts, initial encounter: Secondary | ICD-10-CM | POA: Diagnosis not present

## 2019-01-01 DIAGNOSIS — I871 Compression of vein: Secondary | ICD-10-CM | POA: Diagnosis not present

## 2019-01-02 DIAGNOSIS — E1122 Type 2 diabetes mellitus with diabetic chronic kidney disease: Secondary | ICD-10-CM | POA: Diagnosis not present

## 2019-01-02 DIAGNOSIS — N2581 Secondary hyperparathyroidism of renal origin: Secondary | ICD-10-CM | POA: Diagnosis not present

## 2019-01-02 DIAGNOSIS — N186 End stage renal disease: Secondary | ICD-10-CM | POA: Diagnosis not present

## 2019-01-03 DIAGNOSIS — Z992 Dependence on renal dialysis: Secondary | ICD-10-CM | POA: Diagnosis not present

## 2019-01-03 DIAGNOSIS — E1129 Type 2 diabetes mellitus with other diabetic kidney complication: Secondary | ICD-10-CM | POA: Diagnosis not present

## 2019-01-03 DIAGNOSIS — N186 End stage renal disease: Secondary | ICD-10-CM | POA: Diagnosis not present

## 2019-01-04 DIAGNOSIS — N186 End stage renal disease: Secondary | ICD-10-CM | POA: Diagnosis not present

## 2019-01-04 DIAGNOSIS — N2581 Secondary hyperparathyroidism of renal origin: Secondary | ICD-10-CM | POA: Diagnosis not present

## 2019-01-04 DIAGNOSIS — E1122 Type 2 diabetes mellitus with diabetic chronic kidney disease: Secondary | ICD-10-CM | POA: Diagnosis not present

## 2019-01-04 DIAGNOSIS — D509 Iron deficiency anemia, unspecified: Secondary | ICD-10-CM | POA: Diagnosis not present

## 2019-01-06 DIAGNOSIS — N186 End stage renal disease: Secondary | ICD-10-CM | POA: Diagnosis not present

## 2019-01-06 DIAGNOSIS — N2581 Secondary hyperparathyroidism of renal origin: Secondary | ICD-10-CM | POA: Diagnosis not present

## 2019-01-06 DIAGNOSIS — D509 Iron deficiency anemia, unspecified: Secondary | ICD-10-CM | POA: Diagnosis not present

## 2019-01-06 DIAGNOSIS — E1122 Type 2 diabetes mellitus with diabetic chronic kidney disease: Secondary | ICD-10-CM | POA: Diagnosis not present

## 2019-01-09 DIAGNOSIS — N2581 Secondary hyperparathyroidism of renal origin: Secondary | ICD-10-CM | POA: Diagnosis not present

## 2019-01-09 DIAGNOSIS — D509 Iron deficiency anemia, unspecified: Secondary | ICD-10-CM | POA: Diagnosis not present

## 2019-01-09 DIAGNOSIS — E1122 Type 2 diabetes mellitus with diabetic chronic kidney disease: Secondary | ICD-10-CM | POA: Diagnosis not present

## 2019-01-09 DIAGNOSIS — N186 End stage renal disease: Secondary | ICD-10-CM | POA: Diagnosis not present

## 2019-01-11 DIAGNOSIS — D509 Iron deficiency anemia, unspecified: Secondary | ICD-10-CM | POA: Diagnosis not present

## 2019-01-11 DIAGNOSIS — N186 End stage renal disease: Secondary | ICD-10-CM | POA: Diagnosis not present

## 2019-01-11 DIAGNOSIS — N2581 Secondary hyperparathyroidism of renal origin: Secondary | ICD-10-CM | POA: Diagnosis not present

## 2019-01-11 DIAGNOSIS — E1122 Type 2 diabetes mellitus with diabetic chronic kidney disease: Secondary | ICD-10-CM | POA: Diagnosis not present

## 2019-01-13 DIAGNOSIS — D509 Iron deficiency anemia, unspecified: Secondary | ICD-10-CM | POA: Diagnosis not present

## 2019-01-13 DIAGNOSIS — E1122 Type 2 diabetes mellitus with diabetic chronic kidney disease: Secondary | ICD-10-CM | POA: Diagnosis not present

## 2019-01-13 DIAGNOSIS — N2581 Secondary hyperparathyroidism of renal origin: Secondary | ICD-10-CM | POA: Diagnosis not present

## 2019-01-13 DIAGNOSIS — N186 End stage renal disease: Secondary | ICD-10-CM | POA: Diagnosis not present

## 2019-01-16 DIAGNOSIS — D509 Iron deficiency anemia, unspecified: Secondary | ICD-10-CM | POA: Diagnosis not present

## 2019-01-16 DIAGNOSIS — N186 End stage renal disease: Secondary | ICD-10-CM | POA: Diagnosis not present

## 2019-01-16 DIAGNOSIS — E1122 Type 2 diabetes mellitus with diabetic chronic kidney disease: Secondary | ICD-10-CM | POA: Diagnosis not present

## 2019-01-16 DIAGNOSIS — N2581 Secondary hyperparathyroidism of renal origin: Secondary | ICD-10-CM | POA: Diagnosis not present

## 2019-01-18 DIAGNOSIS — N2581 Secondary hyperparathyroidism of renal origin: Secondary | ICD-10-CM | POA: Diagnosis not present

## 2019-01-18 DIAGNOSIS — N186 End stage renal disease: Secondary | ICD-10-CM | POA: Diagnosis not present

## 2019-01-18 DIAGNOSIS — E1122 Type 2 diabetes mellitus with diabetic chronic kidney disease: Secondary | ICD-10-CM | POA: Diagnosis not present

## 2019-01-18 DIAGNOSIS — D509 Iron deficiency anemia, unspecified: Secondary | ICD-10-CM | POA: Diagnosis not present

## 2019-01-20 DIAGNOSIS — E1122 Type 2 diabetes mellitus with diabetic chronic kidney disease: Secondary | ICD-10-CM | POA: Diagnosis not present

## 2019-01-20 DIAGNOSIS — D509 Iron deficiency anemia, unspecified: Secondary | ICD-10-CM | POA: Diagnosis not present

## 2019-01-20 DIAGNOSIS — N186 End stage renal disease: Secondary | ICD-10-CM | POA: Diagnosis not present

## 2019-01-20 DIAGNOSIS — N2581 Secondary hyperparathyroidism of renal origin: Secondary | ICD-10-CM | POA: Diagnosis not present

## 2019-01-23 DIAGNOSIS — D509 Iron deficiency anemia, unspecified: Secondary | ICD-10-CM | POA: Diagnosis not present

## 2019-01-23 DIAGNOSIS — N2581 Secondary hyperparathyroidism of renal origin: Secondary | ICD-10-CM | POA: Diagnosis not present

## 2019-01-23 DIAGNOSIS — E1122 Type 2 diabetes mellitus with diabetic chronic kidney disease: Secondary | ICD-10-CM | POA: Diagnosis not present

## 2019-01-23 DIAGNOSIS — N186 End stage renal disease: Secondary | ICD-10-CM | POA: Diagnosis not present

## 2019-01-25 DIAGNOSIS — N2581 Secondary hyperparathyroidism of renal origin: Secondary | ICD-10-CM | POA: Diagnosis not present

## 2019-01-25 DIAGNOSIS — N186 End stage renal disease: Secondary | ICD-10-CM | POA: Diagnosis not present

## 2019-01-25 DIAGNOSIS — D509 Iron deficiency anemia, unspecified: Secondary | ICD-10-CM | POA: Diagnosis not present

## 2019-01-25 DIAGNOSIS — E1122 Type 2 diabetes mellitus with diabetic chronic kidney disease: Secondary | ICD-10-CM | POA: Diagnosis not present

## 2019-01-27 DIAGNOSIS — D509 Iron deficiency anemia, unspecified: Secondary | ICD-10-CM | POA: Diagnosis not present

## 2019-01-27 DIAGNOSIS — N186 End stage renal disease: Secondary | ICD-10-CM | POA: Diagnosis not present

## 2019-01-27 DIAGNOSIS — E1122 Type 2 diabetes mellitus with diabetic chronic kidney disease: Secondary | ICD-10-CM | POA: Diagnosis not present

## 2019-01-27 DIAGNOSIS — N2581 Secondary hyperparathyroidism of renal origin: Secondary | ICD-10-CM | POA: Diagnosis not present

## 2019-01-30 DIAGNOSIS — R569 Unspecified convulsions: Secondary | ICD-10-CM | POA: Diagnosis not present

## 2019-01-30 DIAGNOSIS — N2581 Secondary hyperparathyroidism of renal origin: Secondary | ICD-10-CM | POA: Diagnosis not present

## 2019-01-30 DIAGNOSIS — N186 End stage renal disease: Secondary | ICD-10-CM | POA: Diagnosis not present

## 2019-01-30 DIAGNOSIS — D509 Iron deficiency anemia, unspecified: Secondary | ICD-10-CM | POA: Diagnosis not present

## 2019-01-30 DIAGNOSIS — E1122 Type 2 diabetes mellitus with diabetic chronic kidney disease: Secondary | ICD-10-CM | POA: Diagnosis not present

## 2019-02-01 DIAGNOSIS — N2581 Secondary hyperparathyroidism of renal origin: Secondary | ICD-10-CM | POA: Diagnosis not present

## 2019-02-01 DIAGNOSIS — N186 End stage renal disease: Secondary | ICD-10-CM | POA: Diagnosis not present

## 2019-02-01 DIAGNOSIS — E1122 Type 2 diabetes mellitus with diabetic chronic kidney disease: Secondary | ICD-10-CM | POA: Diagnosis not present

## 2019-02-01 DIAGNOSIS — D509 Iron deficiency anemia, unspecified: Secondary | ICD-10-CM | POA: Diagnosis not present

## 2019-02-03 DIAGNOSIS — N186 End stage renal disease: Secondary | ICD-10-CM | POA: Diagnosis not present

## 2019-02-03 DIAGNOSIS — D509 Iron deficiency anemia, unspecified: Secondary | ICD-10-CM | POA: Diagnosis not present

## 2019-02-03 DIAGNOSIS — N2581 Secondary hyperparathyroidism of renal origin: Secondary | ICD-10-CM | POA: Diagnosis not present

## 2019-02-03 DIAGNOSIS — E1122 Type 2 diabetes mellitus with diabetic chronic kidney disease: Secondary | ICD-10-CM | POA: Diagnosis not present

## 2019-02-03 DIAGNOSIS — Z992 Dependence on renal dialysis: Secondary | ICD-10-CM | POA: Diagnosis not present

## 2019-02-03 DIAGNOSIS — D631 Anemia in chronic kidney disease: Secondary | ICD-10-CM | POA: Diagnosis not present

## 2019-02-03 DIAGNOSIS — E1129 Type 2 diabetes mellitus with other diabetic kidney complication: Secondary | ICD-10-CM | POA: Diagnosis not present

## 2019-02-05 DIAGNOSIS — N186 End stage renal disease: Secondary | ICD-10-CM | POA: Diagnosis not present

## 2019-02-05 DIAGNOSIS — L97521 Non-pressure chronic ulcer of other part of left foot limited to breakdown of skin: Secondary | ICD-10-CM | POA: Diagnosis not present

## 2019-02-05 DIAGNOSIS — Z794 Long term (current) use of insulin: Secondary | ICD-10-CM | POA: Diagnosis not present

## 2019-02-05 DIAGNOSIS — E1122 Type 2 diabetes mellitus with diabetic chronic kidney disease: Secondary | ICD-10-CM | POA: Diagnosis not present

## 2019-02-05 DIAGNOSIS — E1152 Type 2 diabetes mellitus with diabetic peripheral angiopathy with gangrene: Secondary | ICD-10-CM | POA: Diagnosis not present

## 2019-02-05 DIAGNOSIS — Z992 Dependence on renal dialysis: Secondary | ICD-10-CM | POA: Diagnosis not present

## 2019-02-05 DIAGNOSIS — L97522 Non-pressure chronic ulcer of other part of left foot with fat layer exposed: Secondary | ICD-10-CM | POA: Diagnosis not present

## 2019-02-06 DIAGNOSIS — N2581 Secondary hyperparathyroidism of renal origin: Secondary | ICD-10-CM | POA: Diagnosis not present

## 2019-02-06 DIAGNOSIS — D509 Iron deficiency anemia, unspecified: Secondary | ICD-10-CM | POA: Diagnosis not present

## 2019-02-06 DIAGNOSIS — D631 Anemia in chronic kidney disease: Secondary | ICD-10-CM | POA: Diagnosis not present

## 2019-02-06 DIAGNOSIS — N186 End stage renal disease: Secondary | ICD-10-CM | POA: Diagnosis not present

## 2019-02-06 DIAGNOSIS — Z992 Dependence on renal dialysis: Secondary | ICD-10-CM | POA: Diagnosis not present

## 2019-02-06 DIAGNOSIS — E1122 Type 2 diabetes mellitus with diabetic chronic kidney disease: Secondary | ICD-10-CM | POA: Diagnosis not present

## 2019-02-07 DIAGNOSIS — L97502 Non-pressure chronic ulcer of other part of unspecified foot with fat layer exposed: Secondary | ICD-10-CM | POA: Diagnosis not present

## 2019-02-08 DIAGNOSIS — D631 Anemia in chronic kidney disease: Secondary | ICD-10-CM | POA: Diagnosis not present

## 2019-02-08 DIAGNOSIS — N186 End stage renal disease: Secondary | ICD-10-CM | POA: Diagnosis not present

## 2019-02-08 DIAGNOSIS — L97522 Non-pressure chronic ulcer of other part of left foot with fat layer exposed: Secondary | ICD-10-CM | POA: Diagnosis not present

## 2019-02-08 DIAGNOSIS — N2581 Secondary hyperparathyroidism of renal origin: Secondary | ICD-10-CM | POA: Diagnosis not present

## 2019-02-08 DIAGNOSIS — I779 Disorder of arteries and arterioles, unspecified: Secondary | ICD-10-CM | POA: Diagnosis not present

## 2019-02-08 DIAGNOSIS — E1122 Type 2 diabetes mellitus with diabetic chronic kidney disease: Secondary | ICD-10-CM | POA: Diagnosis not present

## 2019-02-08 DIAGNOSIS — D509 Iron deficiency anemia, unspecified: Secondary | ICD-10-CM | POA: Diagnosis not present

## 2019-02-08 DIAGNOSIS — Z794 Long term (current) use of insulin: Secondary | ICD-10-CM | POA: Diagnosis not present

## 2019-02-08 DIAGNOSIS — E0852 Diabetes mellitus due to underlying condition with diabetic peripheral angiopathy with gangrene: Secondary | ICD-10-CM | POA: Diagnosis not present

## 2019-02-08 DIAGNOSIS — Z992 Dependence on renal dialysis: Secondary | ICD-10-CM | POA: Diagnosis not present

## 2019-02-09 DIAGNOSIS — Z20828 Contact with and (suspected) exposure to other viral communicable diseases: Secondary | ICD-10-CM | POA: Diagnosis not present

## 2019-02-09 DIAGNOSIS — Z01812 Encounter for preprocedural laboratory examination: Secondary | ICD-10-CM | POA: Diagnosis not present

## 2019-02-09 DIAGNOSIS — Z1159 Encounter for screening for other viral diseases: Secondary | ICD-10-CM | POA: Diagnosis not present

## 2019-02-09 DIAGNOSIS — Z794 Long term (current) use of insulin: Secondary | ICD-10-CM | POA: Diagnosis not present

## 2019-02-09 DIAGNOSIS — I779 Disorder of arteries and arterioles, unspecified: Secondary | ICD-10-CM | POA: Diagnosis not present

## 2019-02-09 DIAGNOSIS — E0852 Diabetes mellitus due to underlying condition with diabetic peripheral angiopathy with gangrene: Secondary | ICD-10-CM | POA: Diagnosis not present

## 2019-02-09 DIAGNOSIS — L97522 Non-pressure chronic ulcer of other part of left foot with fat layer exposed: Secondary | ICD-10-CM | POA: Diagnosis not present

## 2019-02-10 DIAGNOSIS — N2581 Secondary hyperparathyroidism of renal origin: Secondary | ICD-10-CM | POA: Diagnosis not present

## 2019-02-10 DIAGNOSIS — E1122 Type 2 diabetes mellitus with diabetic chronic kidney disease: Secondary | ICD-10-CM | POA: Diagnosis not present

## 2019-02-10 DIAGNOSIS — D631 Anemia in chronic kidney disease: Secondary | ICD-10-CM | POA: Diagnosis not present

## 2019-02-10 DIAGNOSIS — D509 Iron deficiency anemia, unspecified: Secondary | ICD-10-CM | POA: Diagnosis not present

## 2019-02-10 DIAGNOSIS — Z992 Dependence on renal dialysis: Secondary | ICD-10-CM | POA: Diagnosis not present

## 2019-02-10 DIAGNOSIS — N186 End stage renal disease: Secondary | ICD-10-CM | POA: Diagnosis not present

## 2019-02-12 DIAGNOSIS — Z794 Long term (current) use of insulin: Secondary | ICD-10-CM | POA: Diagnosis not present

## 2019-02-12 DIAGNOSIS — L97524 Non-pressure chronic ulcer of other part of left foot with necrosis of bone: Secondary | ICD-10-CM | POA: Diagnosis not present

## 2019-02-12 DIAGNOSIS — E0852 Diabetes mellitus due to underlying condition with diabetic peripheral angiopathy with gangrene: Secondary | ICD-10-CM | POA: Diagnosis not present

## 2019-02-12 DIAGNOSIS — M869 Osteomyelitis, unspecified: Secondary | ICD-10-CM | POA: Diagnosis not present

## 2019-02-13 DIAGNOSIS — E1122 Type 2 diabetes mellitus with diabetic chronic kidney disease: Secondary | ICD-10-CM | POA: Diagnosis not present

## 2019-02-13 DIAGNOSIS — D631 Anemia in chronic kidney disease: Secondary | ICD-10-CM | POA: Diagnosis not present

## 2019-02-13 DIAGNOSIS — N2581 Secondary hyperparathyroidism of renal origin: Secondary | ICD-10-CM | POA: Diagnosis not present

## 2019-02-13 DIAGNOSIS — Z992 Dependence on renal dialysis: Secondary | ICD-10-CM | POA: Diagnosis not present

## 2019-02-13 DIAGNOSIS — D509 Iron deficiency anemia, unspecified: Secondary | ICD-10-CM | POA: Diagnosis not present

## 2019-02-13 DIAGNOSIS — N186 End stage renal disease: Secondary | ICD-10-CM | POA: Diagnosis not present

## 2019-02-15 DIAGNOSIS — I509 Heart failure, unspecified: Secondary | ICD-10-CM | POA: Diagnosis not present

## 2019-02-15 DIAGNOSIS — L97522 Non-pressure chronic ulcer of other part of left foot with fat layer exposed: Secondary | ICD-10-CM | POA: Diagnosis not present

## 2019-02-15 DIAGNOSIS — I998 Other disorder of circulatory system: Secondary | ICD-10-CM | POA: Diagnosis not present

## 2019-02-15 DIAGNOSIS — Z992 Dependence on renal dialysis: Secondary | ICD-10-CM | POA: Diagnosis not present

## 2019-02-15 DIAGNOSIS — E1151 Type 2 diabetes mellitus with diabetic peripheral angiopathy without gangrene: Secondary | ICD-10-CM | POA: Diagnosis not present

## 2019-02-15 DIAGNOSIS — I70245 Atherosclerosis of native arteries of left leg with ulceration of other part of foot: Secondary | ICD-10-CM | POA: Diagnosis not present

## 2019-02-15 DIAGNOSIS — I779 Disorder of arteries and arterioles, unspecified: Secondary | ICD-10-CM | POA: Diagnosis not present

## 2019-02-15 DIAGNOSIS — N186 End stage renal disease: Secondary | ICD-10-CM | POA: Diagnosis not present

## 2019-02-15 DIAGNOSIS — E1122 Type 2 diabetes mellitus with diabetic chronic kidney disease: Secondary | ICD-10-CM | POA: Diagnosis not present

## 2019-02-15 DIAGNOSIS — K219 Gastro-esophageal reflux disease without esophagitis: Secondary | ICD-10-CM | POA: Diagnosis not present

## 2019-02-15 DIAGNOSIS — I132 Hypertensive heart and chronic kidney disease with heart failure and with stage 5 chronic kidney disease, or end stage renal disease: Secondary | ICD-10-CM | POA: Diagnosis not present

## 2019-02-16 DIAGNOSIS — Z992 Dependence on renal dialysis: Secondary | ICD-10-CM | POA: Diagnosis not present

## 2019-02-16 DIAGNOSIS — N2581 Secondary hyperparathyroidism of renal origin: Secondary | ICD-10-CM | POA: Diagnosis not present

## 2019-02-16 DIAGNOSIS — N186 End stage renal disease: Secondary | ICD-10-CM | POA: Diagnosis not present

## 2019-02-16 DIAGNOSIS — D631 Anemia in chronic kidney disease: Secondary | ICD-10-CM | POA: Diagnosis not present

## 2019-02-16 DIAGNOSIS — E1122 Type 2 diabetes mellitus with diabetic chronic kidney disease: Secondary | ICD-10-CM | POA: Diagnosis not present

## 2019-02-16 DIAGNOSIS — D509 Iron deficiency anemia, unspecified: Secondary | ICD-10-CM | POA: Diagnosis not present

## 2019-02-17 DIAGNOSIS — N2581 Secondary hyperparathyroidism of renal origin: Secondary | ICD-10-CM | POA: Diagnosis not present

## 2019-02-17 DIAGNOSIS — Z992 Dependence on renal dialysis: Secondary | ICD-10-CM | POA: Diagnosis not present

## 2019-02-17 DIAGNOSIS — N186 End stage renal disease: Secondary | ICD-10-CM | POA: Diagnosis not present

## 2019-02-17 DIAGNOSIS — E1122 Type 2 diabetes mellitus with diabetic chronic kidney disease: Secondary | ICD-10-CM | POA: Diagnosis not present

## 2019-02-17 DIAGNOSIS — D631 Anemia in chronic kidney disease: Secondary | ICD-10-CM | POA: Diagnosis not present

## 2019-02-17 DIAGNOSIS — D509 Iron deficiency anemia, unspecified: Secondary | ICD-10-CM | POA: Diagnosis not present

## 2019-02-20 DIAGNOSIS — D631 Anemia in chronic kidney disease: Secondary | ICD-10-CM | POA: Diagnosis not present

## 2019-02-20 DIAGNOSIS — E1122 Type 2 diabetes mellitus with diabetic chronic kidney disease: Secondary | ICD-10-CM | POA: Diagnosis not present

## 2019-02-20 DIAGNOSIS — Z992 Dependence on renal dialysis: Secondary | ICD-10-CM | POA: Diagnosis not present

## 2019-02-20 DIAGNOSIS — N186 End stage renal disease: Secondary | ICD-10-CM | POA: Diagnosis not present

## 2019-02-20 DIAGNOSIS — N2581 Secondary hyperparathyroidism of renal origin: Secondary | ICD-10-CM | POA: Diagnosis not present

## 2019-02-20 DIAGNOSIS — D509 Iron deficiency anemia, unspecified: Secondary | ICD-10-CM | POA: Diagnosis not present

## 2019-02-21 DIAGNOSIS — D631 Anemia in chronic kidney disease: Secondary | ICD-10-CM | POA: Diagnosis present

## 2019-02-21 DIAGNOSIS — I132 Hypertensive heart and chronic kidney disease with heart failure and with stage 5 chronic kidney disease, or end stage renal disease: Secondary | ICD-10-CM | POA: Diagnosis not present

## 2019-02-21 DIAGNOSIS — E1169 Type 2 diabetes mellitus with other specified complication: Secondary | ICD-10-CM | POA: Diagnosis not present

## 2019-02-21 DIAGNOSIS — Z794 Long term (current) use of insulin: Secondary | ICD-10-CM | POA: Diagnosis not present

## 2019-02-21 DIAGNOSIS — E1152 Type 2 diabetes mellitus with diabetic peripheral angiopathy with gangrene: Secondary | ICD-10-CM | POA: Diagnosis not present

## 2019-02-21 DIAGNOSIS — L97522 Non-pressure chronic ulcer of other part of left foot with fat layer exposed: Secondary | ICD-10-CM | POA: Diagnosis present

## 2019-02-21 DIAGNOSIS — Z8249 Family history of ischemic heart disease and other diseases of the circulatory system: Secondary | ICD-10-CM | POA: Diagnosis not present

## 2019-02-21 DIAGNOSIS — M86172 Other acute osteomyelitis, left ankle and foot: Secondary | ICD-10-CM | POA: Diagnosis present

## 2019-02-21 DIAGNOSIS — Z792 Long term (current) use of antibiotics: Secondary | ICD-10-CM | POA: Diagnosis not present

## 2019-02-21 DIAGNOSIS — Z452 Encounter for adjustment and management of vascular access device: Secondary | ICD-10-CM | POA: Diagnosis not present

## 2019-02-21 DIAGNOSIS — Z7982 Long term (current) use of aspirin: Secondary | ICD-10-CM | POA: Diagnosis not present

## 2019-02-21 DIAGNOSIS — Z20828 Contact with and (suspected) exposure to other viral communicable diseases: Secondary | ICD-10-CM | POA: Diagnosis present

## 2019-02-21 DIAGNOSIS — L03116 Cellulitis of left lower limb: Secondary | ICD-10-CM | POA: Diagnosis not present

## 2019-02-21 DIAGNOSIS — G40909 Epilepsy, unspecified, not intractable, without status epilepticus: Secondary | ICD-10-CM | POA: Diagnosis present

## 2019-02-21 DIAGNOSIS — E1142 Type 2 diabetes mellitus with diabetic polyneuropathy: Secondary | ICD-10-CM | POA: Diagnosis present

## 2019-02-21 DIAGNOSIS — B9689 Other specified bacterial agents as the cause of diseases classified elsewhere: Secondary | ICD-10-CM | POA: Diagnosis present

## 2019-02-21 DIAGNOSIS — Z4781 Encounter for orthopedic aftercare following surgical amputation: Secondary | ICD-10-CM | POA: Diagnosis not present

## 2019-02-21 DIAGNOSIS — D72829 Elevated white blood cell count, unspecified: Secondary | ICD-10-CM | POA: Diagnosis not present

## 2019-02-21 DIAGNOSIS — M869 Osteomyelitis, unspecified: Secondary | ICD-10-CM | POA: Diagnosis not present

## 2019-02-21 DIAGNOSIS — E1151 Type 2 diabetes mellitus with diabetic peripheral angiopathy without gangrene: Secondary | ICD-10-CM | POA: Diagnosis present

## 2019-02-21 DIAGNOSIS — E1136 Type 2 diabetes mellitus with diabetic cataract: Secondary | ICD-10-CM | POA: Diagnosis present

## 2019-02-21 DIAGNOSIS — I5030 Unspecified diastolic (congestive) heart failure: Secondary | ICD-10-CM | POA: Diagnosis present

## 2019-02-21 DIAGNOSIS — I509 Heart failure, unspecified: Secondary | ICD-10-CM | POA: Diagnosis not present

## 2019-02-21 DIAGNOSIS — I150 Renovascular hypertension: Secondary | ICD-10-CM | POA: Diagnosis not present

## 2019-02-21 DIAGNOSIS — K219 Gastro-esophageal reflux disease without esophagitis: Secondary | ICD-10-CM | POA: Diagnosis present

## 2019-02-21 DIAGNOSIS — Z992 Dependence on renal dialysis: Secondary | ICD-10-CM | POA: Diagnosis not present

## 2019-02-21 DIAGNOSIS — J984 Other disorders of lung: Secondary | ICD-10-CM | POA: Diagnosis not present

## 2019-02-21 DIAGNOSIS — M7989 Other specified soft tissue disorders: Secondary | ICD-10-CM | POA: Diagnosis not present

## 2019-02-21 DIAGNOSIS — E8889 Other specified metabolic disorders: Secondary | ICD-10-CM | POA: Diagnosis present

## 2019-02-21 DIAGNOSIS — S98132A Complete traumatic amputation of one left lesser toe, initial encounter: Secondary | ICD-10-CM | POA: Diagnosis not present

## 2019-02-21 DIAGNOSIS — Z89422 Acquired absence of other left toe(s): Secondary | ICD-10-CM | POA: Diagnosis not present

## 2019-02-21 DIAGNOSIS — Z89432 Acquired absence of left foot: Secondary | ICD-10-CM | POA: Diagnosis not present

## 2019-02-21 DIAGNOSIS — I96 Gangrene, not elsewhere classified: Secondary | ICD-10-CM | POA: Diagnosis not present

## 2019-02-21 DIAGNOSIS — E11621 Type 2 diabetes mellitus with foot ulcer: Secondary | ICD-10-CM | POA: Diagnosis present

## 2019-02-21 DIAGNOSIS — I70209 Unspecified atherosclerosis of native arteries of extremities, unspecified extremity: Secondary | ICD-10-CM | POA: Diagnosis not present

## 2019-02-21 DIAGNOSIS — N186 End stage renal disease: Secondary | ICD-10-CM | POA: Diagnosis present

## 2019-02-21 DIAGNOSIS — E11628 Type 2 diabetes mellitus with other skin complications: Secondary | ICD-10-CM | POA: Diagnosis not present

## 2019-02-21 DIAGNOSIS — E1165 Type 2 diabetes mellitus with hyperglycemia: Secondary | ICD-10-CM | POA: Diagnosis present

## 2019-02-21 DIAGNOSIS — I272 Pulmonary hypertension, unspecified: Secondary | ICD-10-CM | POA: Diagnosis present

## 2019-02-21 DIAGNOSIS — E1122 Type 2 diabetes mellitus with diabetic chronic kidney disease: Secondary | ICD-10-CM | POA: Diagnosis present

## 2019-02-21 DIAGNOSIS — L97526 Non-pressure chronic ulcer of other part of left foot with bone involvement without evidence of necrosis: Secondary | ICD-10-CM | POA: Diagnosis not present

## 2019-02-21 DIAGNOSIS — Z8614 Personal history of Methicillin resistant Staphylococcus aureus infection: Secondary | ICD-10-CM | POA: Diagnosis not present

## 2019-02-21 DIAGNOSIS — L089 Local infection of the skin and subcutaneous tissue, unspecified: Secondary | ICD-10-CM | POA: Diagnosis not present

## 2019-02-21 DIAGNOSIS — I120 Hypertensive chronic kidney disease with stage 5 chronic kidney disease or end stage renal disease: Secondary | ICD-10-CM | POA: Diagnosis not present

## 2019-02-28 DIAGNOSIS — M86172 Other acute osteomyelitis, left ankle and foot: Secondary | ICD-10-CM | POA: Diagnosis not present

## 2019-02-28 DIAGNOSIS — I509 Heart failure, unspecified: Secondary | ICD-10-CM | POA: Diagnosis not present

## 2019-02-28 DIAGNOSIS — I132 Hypertensive heart and chronic kidney disease with heart failure and with stage 5 chronic kidney disease, or end stage renal disease: Secondary | ICD-10-CM | POA: Diagnosis not present

## 2019-02-28 DIAGNOSIS — Z4781 Encounter for orthopedic aftercare following surgical amputation: Secondary | ICD-10-CM | POA: Diagnosis not present

## 2019-02-28 DIAGNOSIS — E1169 Type 2 diabetes mellitus with other specified complication: Secondary | ICD-10-CM | POA: Diagnosis not present

## 2019-02-28 DIAGNOSIS — L03116 Cellulitis of left lower limb: Secondary | ICD-10-CM | POA: Diagnosis not present

## 2019-02-28 MED ORDER — ASSURE II CONTROL LEVEL 1 & 2 VI
25.00 | Status: DC
Start: 2019-02-28 — End: 2019-02-28

## 2019-02-28 MED ORDER — CVS KIDPANT BOYS X-LARGE MISC
40.00 | Status: DC
Start: 2019-03-01 — End: 2019-02-28

## 2019-02-28 MED ORDER — CELLULOSE SODIUM PHOSPHATE VI
5000.00 | Status: DC
Start: 2019-02-28 — End: 2019-02-28

## 2019-02-28 MED ORDER — VICON FORTE PO CAPS
17.00 | ORAL_CAPSULE | ORAL | Status: DC
Start: ? — End: 2019-02-28

## 2019-02-28 MED ORDER — Medication
5.00 | Status: DC
Start: ? — End: 2019-02-28

## 2019-02-28 MED ORDER — Medication
500.00 | Status: DC
Start: ? — End: 2019-02-28

## 2019-02-28 MED ORDER — PHENYLEPH-POT GUAIACOLSULF
81.00 | Status: DC
Start: 2019-03-01 — End: 2019-02-28

## 2019-02-28 MED ORDER — MEDI-TUSSIN DM DOUBLE STRENGTH 30-200 MG/5ML PO LIQD
1000.00 | ORAL | Status: DC
Start: ? — End: 2019-02-28

## 2019-02-28 MED ORDER — Medication
5.00 | Status: DC
Start: 2019-02-28 — End: 2019-02-28

## 2019-02-28 MED ORDER — COMPOUND W FREEZE OFF EX AERO
2.00 | INHALATION_SPRAY | CUTANEOUS | Status: DC
Start: 2019-02-28 — End: 2019-02-28

## 2019-02-28 MED ORDER — Medication
1.00 | Status: DC
Start: 2019-02-28 — End: 2019-02-28

## 2019-02-28 MED ORDER — ENTSOL NASAL NA GEL
5000.00 | NASAL | Status: DC
Start: 2019-02-28 — End: 2019-02-28

## 2019-02-28 MED ORDER — OLANZAPINE-FLUOXETINE HCL 6-50 MG PO CAPS
3.00 | ORAL_CAPSULE | ORAL | Status: DC
Start: ? — End: 2019-02-28

## 2019-02-28 MED ORDER — GLUCOSAMINE-CHONDROIT-COLLAGEN PO
100.00 | ORAL | Status: DC
Start: ? — End: 2019-02-28

## 2019-02-28 MED ORDER — GLUCAGON HCL RDNA (DIAGNOSTIC) 1 MG IJ SOLR
1.00 | INTRAMUSCULAR | Status: DC
Start: ? — End: 2019-02-28

## 2019-02-28 MED ORDER — PHENYLEPHRINE-GUAIFENESIN 30-400 MG PO CP12
10.00 | ORAL_CAPSULE | ORAL | Status: DC
Start: 2019-02-28 — End: 2019-02-28

## 2019-02-28 MED ORDER — Medication
30.00 | Status: DC
Start: ? — End: 2019-02-28

## 2019-02-28 MED ORDER — NUTREN RENAL PO LIQD
800.00 | ORAL | Status: DC
Start: 2019-02-28 — End: 2019-02-28

## 2019-02-28 MED ORDER — STRI-DEX MAXIMUM STRENGTH 2 % EX PADS
125.00 | MEDICATED_PAD | CUTANEOUS | Status: DC
Start: ? — End: 2019-02-28

## 2019-02-28 MED ORDER — EQUATE NICOTINE 4 MG MT GUM
4.00 | CHEWING_GUM | OROMUCOSAL | Status: DC
Start: ? — End: 2019-02-28

## 2019-02-28 MED ORDER — FOSPHENYTOIN SODIUM 50 MG PE/ML IJ SOLN
15.00 | INTRAMUSCULAR | Status: DC
Start: ? — End: 2019-02-28

## 2019-02-28 MED ORDER — PHENYLEPHRINE-GUAIFENESIN 20-375 MG PO CP12
10.00 | ORAL_CAPSULE | ORAL | Status: DC
Start: ? — End: 2019-02-28

## 2019-03-01 DIAGNOSIS — I509 Heart failure, unspecified: Secondary | ICD-10-CM | POA: Diagnosis not present

## 2019-03-01 DIAGNOSIS — N186 End stage renal disease: Secondary | ICD-10-CM | POA: Diagnosis not present

## 2019-03-01 DIAGNOSIS — D631 Anemia in chronic kidney disease: Secondary | ICD-10-CM | POA: Diagnosis not present

## 2019-03-01 DIAGNOSIS — M86172 Other acute osteomyelitis, left ankle and foot: Secondary | ICD-10-CM | POA: Diagnosis not present

## 2019-03-01 DIAGNOSIS — L03116 Cellulitis of left lower limb: Secondary | ICD-10-CM | POA: Diagnosis not present

## 2019-03-01 DIAGNOSIS — I132 Hypertensive heart and chronic kidney disease with heart failure and with stage 5 chronic kidney disease, or end stage renal disease: Secondary | ICD-10-CM | POA: Diagnosis not present

## 2019-03-01 DIAGNOSIS — Z992 Dependence on renal dialysis: Secondary | ICD-10-CM | POA: Diagnosis not present

## 2019-03-01 DIAGNOSIS — E1169 Type 2 diabetes mellitus with other specified complication: Secondary | ICD-10-CM | POA: Diagnosis not present

## 2019-03-01 DIAGNOSIS — Z4781 Encounter for orthopedic aftercare following surgical amputation: Secondary | ICD-10-CM | POA: Diagnosis not present

## 2019-03-01 DIAGNOSIS — D509 Iron deficiency anemia, unspecified: Secondary | ICD-10-CM | POA: Diagnosis not present

## 2019-03-01 DIAGNOSIS — E1122 Type 2 diabetes mellitus with diabetic chronic kidney disease: Secondary | ICD-10-CM | POA: Diagnosis not present

## 2019-03-01 DIAGNOSIS — N2581 Secondary hyperparathyroidism of renal origin: Secondary | ICD-10-CM | POA: Diagnosis not present

## 2019-03-02 DIAGNOSIS — M86172 Other acute osteomyelitis, left ankle and foot: Secondary | ICD-10-CM | POA: Diagnosis not present

## 2019-03-02 DIAGNOSIS — E1169 Type 2 diabetes mellitus with other specified complication: Secondary | ICD-10-CM | POA: Diagnosis not present

## 2019-03-02 DIAGNOSIS — L03116 Cellulitis of left lower limb: Secondary | ICD-10-CM | POA: Diagnosis not present

## 2019-03-02 DIAGNOSIS — I509 Heart failure, unspecified: Secondary | ICD-10-CM | POA: Diagnosis not present

## 2019-03-02 DIAGNOSIS — I132 Hypertensive heart and chronic kidney disease with heart failure and with stage 5 chronic kidney disease, or end stage renal disease: Secondary | ICD-10-CM | POA: Diagnosis not present

## 2019-03-02 DIAGNOSIS — Z4781 Encounter for orthopedic aftercare following surgical amputation: Secondary | ICD-10-CM | POA: Diagnosis not present

## 2019-03-03 DIAGNOSIS — I132 Hypertensive heart and chronic kidney disease with heart failure and with stage 5 chronic kidney disease, or end stage renal disease: Secondary | ICD-10-CM | POA: Diagnosis not present

## 2019-03-03 DIAGNOSIS — N186 End stage renal disease: Secondary | ICD-10-CM | POA: Diagnosis not present

## 2019-03-03 DIAGNOSIS — E1169 Type 2 diabetes mellitus with other specified complication: Secondary | ICD-10-CM | POA: Diagnosis not present

## 2019-03-03 DIAGNOSIS — Z4781 Encounter for orthopedic aftercare following surgical amputation: Secondary | ICD-10-CM | POA: Diagnosis not present

## 2019-03-03 DIAGNOSIS — D631 Anemia in chronic kidney disease: Secondary | ICD-10-CM | POA: Diagnosis not present

## 2019-03-03 DIAGNOSIS — L03116 Cellulitis of left lower limb: Secondary | ICD-10-CM | POA: Diagnosis not present

## 2019-03-03 DIAGNOSIS — Z992 Dependence on renal dialysis: Secondary | ICD-10-CM | POA: Diagnosis not present

## 2019-03-03 DIAGNOSIS — E1122 Type 2 diabetes mellitus with diabetic chronic kidney disease: Secondary | ICD-10-CM | POA: Diagnosis not present

## 2019-03-03 DIAGNOSIS — M86172 Other acute osteomyelitis, left ankle and foot: Secondary | ICD-10-CM | POA: Diagnosis not present

## 2019-03-03 DIAGNOSIS — I509 Heart failure, unspecified: Secondary | ICD-10-CM | POA: Diagnosis not present

## 2019-03-03 DIAGNOSIS — D509 Iron deficiency anemia, unspecified: Secondary | ICD-10-CM | POA: Diagnosis not present

## 2019-03-03 DIAGNOSIS — N2581 Secondary hyperparathyroidism of renal origin: Secondary | ICD-10-CM | POA: Diagnosis not present

## 2019-03-05 DIAGNOSIS — I509 Heart failure, unspecified: Secondary | ICD-10-CM | POA: Diagnosis not present

## 2019-03-05 DIAGNOSIS — E1169 Type 2 diabetes mellitus with other specified complication: Secondary | ICD-10-CM | POA: Diagnosis not present

## 2019-03-05 DIAGNOSIS — I70202 Unspecified atherosclerosis of native arteries of extremities, left leg: Secondary | ICD-10-CM | POA: Diagnosis not present

## 2019-03-05 DIAGNOSIS — Z95820 Peripheral vascular angioplasty status with implants and grafts: Secondary | ICD-10-CM | POA: Diagnosis not present

## 2019-03-05 DIAGNOSIS — Z4781 Encounter for orthopedic aftercare following surgical amputation: Secondary | ICD-10-CM | POA: Diagnosis not present

## 2019-03-05 DIAGNOSIS — M86172 Other acute osteomyelitis, left ankle and foot: Secondary | ICD-10-CM | POA: Diagnosis not present

## 2019-03-05 DIAGNOSIS — L97522 Non-pressure chronic ulcer of other part of left foot with fat layer exposed: Secondary | ICD-10-CM | POA: Diagnosis not present

## 2019-03-05 DIAGNOSIS — I132 Hypertensive heart and chronic kidney disease with heart failure and with stage 5 chronic kidney disease, or end stage renal disease: Secondary | ICD-10-CM | POA: Diagnosis not present

## 2019-03-05 DIAGNOSIS — L03116 Cellulitis of left lower limb: Secondary | ICD-10-CM | POA: Diagnosis not present

## 2019-03-06 DIAGNOSIS — L03116 Cellulitis of left lower limb: Secondary | ICD-10-CM | POA: Diagnosis not present

## 2019-03-06 DIAGNOSIS — Z992 Dependence on renal dialysis: Secondary | ICD-10-CM | POA: Diagnosis not present

## 2019-03-06 DIAGNOSIS — E1129 Type 2 diabetes mellitus with other diabetic kidney complication: Secondary | ICD-10-CM | POA: Diagnosis not present

## 2019-03-06 DIAGNOSIS — D509 Iron deficiency anemia, unspecified: Secondary | ICD-10-CM | POA: Diagnosis not present

## 2019-03-06 DIAGNOSIS — D631 Anemia in chronic kidney disease: Secondary | ICD-10-CM | POA: Diagnosis not present

## 2019-03-06 DIAGNOSIS — E1122 Type 2 diabetes mellitus with diabetic chronic kidney disease: Secondary | ICD-10-CM | POA: Diagnosis not present

## 2019-03-06 DIAGNOSIS — E1169 Type 2 diabetes mellitus with other specified complication: Secondary | ICD-10-CM | POA: Diagnosis not present

## 2019-03-06 DIAGNOSIS — N186 End stage renal disease: Secondary | ICD-10-CM | POA: Diagnosis not present

## 2019-03-06 DIAGNOSIS — Z4781 Encounter for orthopedic aftercare following surgical amputation: Secondary | ICD-10-CM | POA: Diagnosis not present

## 2019-03-06 DIAGNOSIS — I509 Heart failure, unspecified: Secondary | ICD-10-CM | POA: Diagnosis not present

## 2019-03-06 DIAGNOSIS — I132 Hypertensive heart and chronic kidney disease with heart failure and with stage 5 chronic kidney disease, or end stage renal disease: Secondary | ICD-10-CM | POA: Diagnosis not present

## 2019-03-06 DIAGNOSIS — M869 Osteomyelitis, unspecified: Secondary | ICD-10-CM | POA: Diagnosis not present

## 2019-03-06 DIAGNOSIS — N2581 Secondary hyperparathyroidism of renal origin: Secondary | ICD-10-CM | POA: Diagnosis not present

## 2019-03-06 DIAGNOSIS — M86172 Other acute osteomyelitis, left ankle and foot: Secondary | ICD-10-CM | POA: Diagnosis not present

## 2019-03-07 DIAGNOSIS — I509 Heart failure, unspecified: Secondary | ICD-10-CM | POA: Diagnosis not present

## 2019-03-07 DIAGNOSIS — E1122 Type 2 diabetes mellitus with diabetic chronic kidney disease: Secondary | ICD-10-CM | POA: Diagnosis not present

## 2019-03-07 DIAGNOSIS — E1142 Type 2 diabetes mellitus with diabetic polyneuropathy: Secondary | ICD-10-CM | POA: Diagnosis not present

## 2019-03-07 DIAGNOSIS — L97829 Non-pressure chronic ulcer of other part of left lower leg with unspecified severity: Secondary | ICD-10-CM | POA: Diagnosis not present

## 2019-03-07 DIAGNOSIS — I132 Hypertensive heart and chronic kidney disease with heart failure and with stage 5 chronic kidney disease, or end stage renal disease: Secondary | ICD-10-CM | POA: Diagnosis not present

## 2019-03-07 DIAGNOSIS — N186 End stage renal disease: Secondary | ICD-10-CM | POA: Diagnosis not present

## 2019-03-07 DIAGNOSIS — Z794 Long term (current) use of insulin: Secondary | ICD-10-CM | POA: Diagnosis not present

## 2019-03-07 DIAGNOSIS — E1152 Type 2 diabetes mellitus with diabetic peripheral angiopathy with gangrene: Secondary | ICD-10-CM | POA: Diagnosis not present

## 2019-03-08 DIAGNOSIS — Z992 Dependence on renal dialysis: Secondary | ICD-10-CM | POA: Diagnosis not present

## 2019-03-08 DIAGNOSIS — N186 End stage renal disease: Secondary | ICD-10-CM | POA: Diagnosis not present

## 2019-03-08 DIAGNOSIS — E1169 Type 2 diabetes mellitus with other specified complication: Secondary | ICD-10-CM | POA: Diagnosis not present

## 2019-03-08 DIAGNOSIS — M86172 Other acute osteomyelitis, left ankle and foot: Secondary | ICD-10-CM | POA: Diagnosis not present

## 2019-03-08 DIAGNOSIS — Z4781 Encounter for orthopedic aftercare following surgical amputation: Secondary | ICD-10-CM | POA: Diagnosis not present

## 2019-03-08 DIAGNOSIS — E1122 Type 2 diabetes mellitus with diabetic chronic kidney disease: Secondary | ICD-10-CM | POA: Diagnosis not present

## 2019-03-08 DIAGNOSIS — I132 Hypertensive heart and chronic kidney disease with heart failure and with stage 5 chronic kidney disease, or end stage renal disease: Secondary | ICD-10-CM | POA: Diagnosis not present

## 2019-03-08 DIAGNOSIS — D509 Iron deficiency anemia, unspecified: Secondary | ICD-10-CM | POA: Diagnosis not present

## 2019-03-08 DIAGNOSIS — L03116 Cellulitis of left lower limb: Secondary | ICD-10-CM | POA: Diagnosis not present

## 2019-03-08 DIAGNOSIS — I509 Heart failure, unspecified: Secondary | ICD-10-CM | POA: Diagnosis not present

## 2019-03-08 DIAGNOSIS — D631 Anemia in chronic kidney disease: Secondary | ICD-10-CM | POA: Diagnosis not present

## 2019-03-08 DIAGNOSIS — N2581 Secondary hyperparathyroidism of renal origin: Secondary | ICD-10-CM | POA: Diagnosis not present

## 2019-03-10 DIAGNOSIS — N2581 Secondary hyperparathyroidism of renal origin: Secondary | ICD-10-CM | POA: Diagnosis not present

## 2019-03-10 DIAGNOSIS — Z992 Dependence on renal dialysis: Secondary | ICD-10-CM | POA: Diagnosis not present

## 2019-03-10 DIAGNOSIS — N186 End stage renal disease: Secondary | ICD-10-CM | POA: Diagnosis not present

## 2019-03-10 DIAGNOSIS — D631 Anemia in chronic kidney disease: Secondary | ICD-10-CM | POA: Diagnosis not present

## 2019-03-10 DIAGNOSIS — E1122 Type 2 diabetes mellitus with diabetic chronic kidney disease: Secondary | ICD-10-CM | POA: Diagnosis not present

## 2019-03-10 DIAGNOSIS — D509 Iron deficiency anemia, unspecified: Secondary | ICD-10-CM | POA: Diagnosis not present

## 2019-03-11 DIAGNOSIS — E1169 Type 2 diabetes mellitus with other specified complication: Secondary | ICD-10-CM | POA: Diagnosis not present

## 2019-03-11 DIAGNOSIS — Z792 Long term (current) use of antibiotics: Secondary | ICD-10-CM | POA: Diagnosis not present

## 2019-03-11 DIAGNOSIS — M86172 Other acute osteomyelitis, left ankle and foot: Secondary | ICD-10-CM | POA: Diagnosis not present

## 2019-03-11 DIAGNOSIS — Z4781 Encounter for orthopedic aftercare following surgical amputation: Secondary | ICD-10-CM | POA: Diagnosis not present

## 2019-03-11 DIAGNOSIS — I509 Heart failure, unspecified: Secondary | ICD-10-CM | POA: Diagnosis not present

## 2019-03-11 DIAGNOSIS — I132 Hypertensive heart and chronic kidney disease with heart failure and with stage 5 chronic kidney disease, or end stage renal disease: Secondary | ICD-10-CM | POA: Diagnosis not present

## 2019-03-11 DIAGNOSIS — L03116 Cellulitis of left lower limb: Secondary | ICD-10-CM | POA: Diagnosis not present

## 2019-03-13 DIAGNOSIS — D631 Anemia in chronic kidney disease: Secondary | ICD-10-CM | POA: Diagnosis not present

## 2019-03-13 DIAGNOSIS — E1122 Type 2 diabetes mellitus with diabetic chronic kidney disease: Secondary | ICD-10-CM | POA: Diagnosis not present

## 2019-03-13 DIAGNOSIS — N186 End stage renal disease: Secondary | ICD-10-CM | POA: Diagnosis not present

## 2019-03-13 DIAGNOSIS — N2581 Secondary hyperparathyroidism of renal origin: Secondary | ICD-10-CM | POA: Diagnosis not present

## 2019-03-13 DIAGNOSIS — Z992 Dependence on renal dialysis: Secondary | ICD-10-CM | POA: Diagnosis not present

## 2019-03-13 DIAGNOSIS — D509 Iron deficiency anemia, unspecified: Secondary | ICD-10-CM | POA: Diagnosis not present

## 2019-03-14 DIAGNOSIS — I132 Hypertensive heart and chronic kidney disease with heart failure and with stage 5 chronic kidney disease, or end stage renal disease: Secondary | ICD-10-CM | POA: Diagnosis not present

## 2019-03-14 DIAGNOSIS — M86172 Other acute osteomyelitis, left ankle and foot: Secondary | ICD-10-CM | POA: Diagnosis not present

## 2019-03-14 DIAGNOSIS — Z4781 Encounter for orthopedic aftercare following surgical amputation: Secondary | ICD-10-CM | POA: Diagnosis not present

## 2019-03-14 DIAGNOSIS — I509 Heart failure, unspecified: Secondary | ICD-10-CM | POA: Diagnosis not present

## 2019-03-14 DIAGNOSIS — L03116 Cellulitis of left lower limb: Secondary | ICD-10-CM | POA: Diagnosis not present

## 2019-03-14 DIAGNOSIS — E1169 Type 2 diabetes mellitus with other specified complication: Secondary | ICD-10-CM | POA: Diagnosis not present

## 2019-03-15 DIAGNOSIS — Z992 Dependence on renal dialysis: Secondary | ICD-10-CM | POA: Diagnosis not present

## 2019-03-15 DIAGNOSIS — N2581 Secondary hyperparathyroidism of renal origin: Secondary | ICD-10-CM | POA: Diagnosis not present

## 2019-03-15 DIAGNOSIS — E1122 Type 2 diabetes mellitus with diabetic chronic kidney disease: Secondary | ICD-10-CM | POA: Diagnosis not present

## 2019-03-15 DIAGNOSIS — D509 Iron deficiency anemia, unspecified: Secondary | ICD-10-CM | POA: Diagnosis not present

## 2019-03-15 DIAGNOSIS — D631 Anemia in chronic kidney disease: Secondary | ICD-10-CM | POA: Diagnosis not present

## 2019-03-15 DIAGNOSIS — N186 End stage renal disease: Secondary | ICD-10-CM | POA: Diagnosis not present

## 2019-03-16 DIAGNOSIS — E1169 Type 2 diabetes mellitus with other specified complication: Secondary | ICD-10-CM | POA: Diagnosis not present

## 2019-03-16 DIAGNOSIS — Z4781 Encounter for orthopedic aftercare following surgical amputation: Secondary | ICD-10-CM | POA: Diagnosis not present

## 2019-03-16 DIAGNOSIS — M86172 Other acute osteomyelitis, left ankle and foot: Secondary | ICD-10-CM | POA: Diagnosis not present

## 2019-03-16 DIAGNOSIS — I509 Heart failure, unspecified: Secondary | ICD-10-CM | POA: Diagnosis not present

## 2019-03-16 DIAGNOSIS — L03116 Cellulitis of left lower limb: Secondary | ICD-10-CM | POA: Diagnosis not present

## 2019-03-16 DIAGNOSIS — I132 Hypertensive heart and chronic kidney disease with heart failure and with stage 5 chronic kidney disease, or end stage renal disease: Secondary | ICD-10-CM | POA: Diagnosis not present

## 2019-03-17 DIAGNOSIS — D509 Iron deficiency anemia, unspecified: Secondary | ICD-10-CM | POA: Diagnosis not present

## 2019-03-17 DIAGNOSIS — E1122 Type 2 diabetes mellitus with diabetic chronic kidney disease: Secondary | ICD-10-CM | POA: Diagnosis not present

## 2019-03-17 DIAGNOSIS — D631 Anemia in chronic kidney disease: Secondary | ICD-10-CM | POA: Diagnosis not present

## 2019-03-17 DIAGNOSIS — Z992 Dependence on renal dialysis: Secondary | ICD-10-CM | POA: Diagnosis not present

## 2019-03-17 DIAGNOSIS — N2581 Secondary hyperparathyroidism of renal origin: Secondary | ICD-10-CM | POA: Diagnosis not present

## 2019-03-17 DIAGNOSIS — N186 End stage renal disease: Secondary | ICD-10-CM | POA: Diagnosis not present

## 2019-03-19 DIAGNOSIS — M86172 Other acute osteomyelitis, left ankle and foot: Secondary | ICD-10-CM | POA: Diagnosis not present

## 2019-03-19 DIAGNOSIS — E1169 Type 2 diabetes mellitus with other specified complication: Secondary | ICD-10-CM | POA: Diagnosis not present

## 2019-03-19 DIAGNOSIS — L03116 Cellulitis of left lower limb: Secondary | ICD-10-CM | POA: Diagnosis not present

## 2019-03-19 DIAGNOSIS — Z4781 Encounter for orthopedic aftercare following surgical amputation: Secondary | ICD-10-CM | POA: Diagnosis not present

## 2019-03-19 DIAGNOSIS — I132 Hypertensive heart and chronic kidney disease with heart failure and with stage 5 chronic kidney disease, or end stage renal disease: Secondary | ICD-10-CM | POA: Diagnosis not present

## 2019-03-19 DIAGNOSIS — I509 Heart failure, unspecified: Secondary | ICD-10-CM | POA: Diagnosis not present

## 2019-03-19 DIAGNOSIS — Z792 Long term (current) use of antibiotics: Secondary | ICD-10-CM | POA: Diagnosis not present

## 2019-03-20 DIAGNOSIS — D631 Anemia in chronic kidney disease: Secondary | ICD-10-CM | POA: Diagnosis not present

## 2019-03-20 DIAGNOSIS — Z992 Dependence on renal dialysis: Secondary | ICD-10-CM | POA: Diagnosis not present

## 2019-03-20 DIAGNOSIS — D509 Iron deficiency anemia, unspecified: Secondary | ICD-10-CM | POA: Diagnosis not present

## 2019-03-20 DIAGNOSIS — N2581 Secondary hyperparathyroidism of renal origin: Secondary | ICD-10-CM | POA: Diagnosis not present

## 2019-03-20 DIAGNOSIS — E1122 Type 2 diabetes mellitus with diabetic chronic kidney disease: Secondary | ICD-10-CM | POA: Diagnosis not present

## 2019-03-20 DIAGNOSIS — N186 End stage renal disease: Secondary | ICD-10-CM | POA: Diagnosis not present

## 2019-03-21 DIAGNOSIS — Z794 Long term (current) use of insulin: Secondary | ICD-10-CM | POA: Diagnosis not present

## 2019-03-21 DIAGNOSIS — L089 Local infection of the skin and subcutaneous tissue, unspecified: Secondary | ICD-10-CM | POA: Diagnosis not present

## 2019-03-21 DIAGNOSIS — A498 Other bacterial infections of unspecified site: Secondary | ICD-10-CM | POA: Diagnosis not present

## 2019-03-21 DIAGNOSIS — A499 Bacterial infection, unspecified: Secondary | ICD-10-CM | POA: Diagnosis not present

## 2019-03-21 DIAGNOSIS — E1142 Type 2 diabetes mellitus with diabetic polyneuropathy: Secondary | ICD-10-CM | POA: Diagnosis not present

## 2019-03-21 DIAGNOSIS — E1122 Type 2 diabetes mellitus with diabetic chronic kidney disease: Secondary | ICD-10-CM | POA: Diagnosis not present

## 2019-03-21 DIAGNOSIS — E11628 Type 2 diabetes mellitus with other skin complications: Secondary | ICD-10-CM | POA: Diagnosis not present

## 2019-03-21 DIAGNOSIS — Z95828 Presence of other vascular implants and grafts: Secondary | ICD-10-CM | POA: Diagnosis not present

## 2019-03-21 DIAGNOSIS — N186 End stage renal disease: Secondary | ICD-10-CM | POA: Diagnosis not present

## 2019-03-21 DIAGNOSIS — M86672 Other chronic osteomyelitis, left ankle and foot: Secondary | ICD-10-CM | POA: Diagnosis not present

## 2019-03-21 DIAGNOSIS — E1165 Type 2 diabetes mellitus with hyperglycemia: Secondary | ICD-10-CM | POA: Diagnosis not present

## 2019-03-21 DIAGNOSIS — Z792 Long term (current) use of antibiotics: Secondary | ICD-10-CM | POA: Diagnosis not present

## 2019-03-22 DIAGNOSIS — Z4781 Encounter for orthopedic aftercare following surgical amputation: Secondary | ICD-10-CM | POA: Diagnosis not present

## 2019-03-22 DIAGNOSIS — Z992 Dependence on renal dialysis: Secondary | ICD-10-CM | POA: Diagnosis not present

## 2019-03-22 DIAGNOSIS — M86172 Other acute osteomyelitis, left ankle and foot: Secondary | ICD-10-CM | POA: Diagnosis not present

## 2019-03-22 DIAGNOSIS — I132 Hypertensive heart and chronic kidney disease with heart failure and with stage 5 chronic kidney disease, or end stage renal disease: Secondary | ICD-10-CM | POA: Diagnosis not present

## 2019-03-22 DIAGNOSIS — L03116 Cellulitis of left lower limb: Secondary | ICD-10-CM | POA: Diagnosis not present

## 2019-03-22 DIAGNOSIS — N2581 Secondary hyperparathyroidism of renal origin: Secondary | ICD-10-CM | POA: Diagnosis not present

## 2019-03-22 DIAGNOSIS — D631 Anemia in chronic kidney disease: Secondary | ICD-10-CM | POA: Diagnosis not present

## 2019-03-22 DIAGNOSIS — E1169 Type 2 diabetes mellitus with other specified complication: Secondary | ICD-10-CM | POA: Diagnosis not present

## 2019-03-22 DIAGNOSIS — D509 Iron deficiency anemia, unspecified: Secondary | ICD-10-CM | POA: Diagnosis not present

## 2019-03-22 DIAGNOSIS — N186 End stage renal disease: Secondary | ICD-10-CM | POA: Diagnosis not present

## 2019-03-22 DIAGNOSIS — E1122 Type 2 diabetes mellitus with diabetic chronic kidney disease: Secondary | ICD-10-CM | POA: Diagnosis not present

## 2019-03-22 DIAGNOSIS — I509 Heart failure, unspecified: Secondary | ICD-10-CM | POA: Diagnosis not present

## 2019-03-24 DIAGNOSIS — Z992 Dependence on renal dialysis: Secondary | ICD-10-CM | POA: Diagnosis not present

## 2019-03-24 DIAGNOSIS — D631 Anemia in chronic kidney disease: Secondary | ICD-10-CM | POA: Diagnosis not present

## 2019-03-24 DIAGNOSIS — N2581 Secondary hyperparathyroidism of renal origin: Secondary | ICD-10-CM | POA: Diagnosis not present

## 2019-03-24 DIAGNOSIS — E1122 Type 2 diabetes mellitus with diabetic chronic kidney disease: Secondary | ICD-10-CM | POA: Diagnosis not present

## 2019-03-24 DIAGNOSIS — D509 Iron deficiency anemia, unspecified: Secondary | ICD-10-CM | POA: Diagnosis not present

## 2019-03-24 DIAGNOSIS — N186 End stage renal disease: Secondary | ICD-10-CM | POA: Diagnosis not present

## 2019-03-26 DIAGNOSIS — I132 Hypertensive heart and chronic kidney disease with heart failure and with stage 5 chronic kidney disease, or end stage renal disease: Secondary | ICD-10-CM | POA: Diagnosis not present

## 2019-03-26 DIAGNOSIS — M86172 Other acute osteomyelitis, left ankle and foot: Secondary | ICD-10-CM | POA: Diagnosis not present

## 2019-03-26 DIAGNOSIS — L03116 Cellulitis of left lower limb: Secondary | ICD-10-CM | POA: Diagnosis not present

## 2019-03-26 DIAGNOSIS — Z4781 Encounter for orthopedic aftercare following surgical amputation: Secondary | ICD-10-CM | POA: Diagnosis not present

## 2019-03-26 DIAGNOSIS — Z791 Long term (current) use of non-steroidal anti-inflammatories (NSAID): Secondary | ICD-10-CM | POA: Diagnosis not present

## 2019-03-26 DIAGNOSIS — I509 Heart failure, unspecified: Secondary | ICD-10-CM | POA: Diagnosis not present

## 2019-03-26 DIAGNOSIS — E1169 Type 2 diabetes mellitus with other specified complication: Secondary | ICD-10-CM | POA: Diagnosis not present

## 2019-03-27 DIAGNOSIS — Z992 Dependence on renal dialysis: Secondary | ICD-10-CM | POA: Diagnosis not present

## 2019-03-27 DIAGNOSIS — N2581 Secondary hyperparathyroidism of renal origin: Secondary | ICD-10-CM | POA: Diagnosis not present

## 2019-03-27 DIAGNOSIS — D631 Anemia in chronic kidney disease: Secondary | ICD-10-CM | POA: Diagnosis not present

## 2019-03-27 DIAGNOSIS — N186 End stage renal disease: Secondary | ICD-10-CM | POA: Diagnosis not present

## 2019-03-27 DIAGNOSIS — D509 Iron deficiency anemia, unspecified: Secondary | ICD-10-CM | POA: Diagnosis not present

## 2019-03-27 DIAGNOSIS — E1122 Type 2 diabetes mellitus with diabetic chronic kidney disease: Secondary | ICD-10-CM | POA: Diagnosis not present

## 2019-03-29 DIAGNOSIS — I132 Hypertensive heart and chronic kidney disease with heart failure and with stage 5 chronic kidney disease, or end stage renal disease: Secondary | ICD-10-CM | POA: Diagnosis not present

## 2019-03-29 DIAGNOSIS — I509 Heart failure, unspecified: Secondary | ICD-10-CM | POA: Diagnosis not present

## 2019-03-29 DIAGNOSIS — E1122 Type 2 diabetes mellitus with diabetic chronic kidney disease: Secondary | ICD-10-CM | POA: Diagnosis not present

## 2019-03-29 DIAGNOSIS — N186 End stage renal disease: Secondary | ICD-10-CM | POA: Diagnosis not present

## 2019-03-29 DIAGNOSIS — D631 Anemia in chronic kidney disease: Secondary | ICD-10-CM | POA: Diagnosis not present

## 2019-03-29 DIAGNOSIS — D509 Iron deficiency anemia, unspecified: Secondary | ICD-10-CM | POA: Diagnosis not present

## 2019-03-29 DIAGNOSIS — Z4781 Encounter for orthopedic aftercare following surgical amputation: Secondary | ICD-10-CM | POA: Diagnosis not present

## 2019-03-29 DIAGNOSIS — L03116 Cellulitis of left lower limb: Secondary | ICD-10-CM | POA: Diagnosis not present

## 2019-03-29 DIAGNOSIS — Z992 Dependence on renal dialysis: Secondary | ICD-10-CM | POA: Diagnosis not present

## 2019-03-29 DIAGNOSIS — M86172 Other acute osteomyelitis, left ankle and foot: Secondary | ICD-10-CM | POA: Diagnosis not present

## 2019-03-29 DIAGNOSIS — E1169 Type 2 diabetes mellitus with other specified complication: Secondary | ICD-10-CM | POA: Diagnosis not present

## 2019-03-29 DIAGNOSIS — N2581 Secondary hyperparathyroidism of renal origin: Secondary | ICD-10-CM | POA: Diagnosis not present

## 2019-03-30 DIAGNOSIS — Z4781 Encounter for orthopedic aftercare following surgical amputation: Secondary | ICD-10-CM | POA: Diagnosis not present

## 2019-03-30 DIAGNOSIS — E1122 Type 2 diabetes mellitus with diabetic chronic kidney disease: Secondary | ICD-10-CM | POA: Diagnosis not present

## 2019-03-30 DIAGNOSIS — Z7982 Long term (current) use of aspirin: Secondary | ICD-10-CM | POA: Diagnosis not present

## 2019-03-30 DIAGNOSIS — Z8614 Personal history of Methicillin resistant Staphylococcus aureus infection: Secondary | ICD-10-CM | POA: Diagnosis not present

## 2019-03-30 DIAGNOSIS — Z792 Long term (current) use of antibiotics: Secondary | ICD-10-CM | POA: Diagnosis not present

## 2019-03-30 DIAGNOSIS — G40909 Epilepsy, unspecified, not intractable, without status epilepticus: Secondary | ICD-10-CM | POA: Diagnosis not present

## 2019-03-30 DIAGNOSIS — L03116 Cellulitis of left lower limb: Secondary | ICD-10-CM | POA: Diagnosis not present

## 2019-03-30 DIAGNOSIS — I509 Heart failure, unspecified: Secondary | ICD-10-CM | POA: Diagnosis not present

## 2019-03-30 DIAGNOSIS — N186 End stage renal disease: Secondary | ICD-10-CM | POA: Diagnosis not present

## 2019-03-30 DIAGNOSIS — D631 Anemia in chronic kidney disease: Secondary | ICD-10-CM | POA: Diagnosis not present

## 2019-03-30 DIAGNOSIS — Z452 Encounter for adjustment and management of vascular access device: Secondary | ICD-10-CM | POA: Diagnosis not present

## 2019-03-30 DIAGNOSIS — I272 Pulmonary hypertension, unspecified: Secondary | ICD-10-CM | POA: Diagnosis not present

## 2019-03-30 DIAGNOSIS — M86172 Other acute osteomyelitis, left ankle and foot: Secondary | ICD-10-CM | POA: Diagnosis not present

## 2019-03-30 DIAGNOSIS — I132 Hypertensive heart and chronic kidney disease with heart failure and with stage 5 chronic kidney disease, or end stage renal disease: Secondary | ICD-10-CM | POA: Diagnosis not present

## 2019-03-30 DIAGNOSIS — E1169 Type 2 diabetes mellitus with other specified complication: Secondary | ICD-10-CM | POA: Diagnosis not present

## 2019-03-30 DIAGNOSIS — E1136 Type 2 diabetes mellitus with diabetic cataract: Secondary | ICD-10-CM | POA: Diagnosis not present

## 2019-03-30 DIAGNOSIS — Z794 Long term (current) use of insulin: Secondary | ICD-10-CM | POA: Diagnosis not present

## 2019-03-30 DIAGNOSIS — I70209 Unspecified atherosclerosis of native arteries of extremities, unspecified extremity: Secondary | ICD-10-CM | POA: Diagnosis not present

## 2019-03-30 DIAGNOSIS — Z89422 Acquired absence of other left toe(s): Secondary | ICD-10-CM | POA: Diagnosis not present

## 2019-03-30 DIAGNOSIS — Z992 Dependence on renal dialysis: Secondary | ICD-10-CM | POA: Diagnosis not present

## 2019-03-30 DIAGNOSIS — J984 Other disorders of lung: Secondary | ICD-10-CM | POA: Diagnosis not present

## 2019-03-30 DIAGNOSIS — E1151 Type 2 diabetes mellitus with diabetic peripheral angiopathy without gangrene: Secondary | ICD-10-CM | POA: Diagnosis not present

## 2019-03-30 DIAGNOSIS — K219 Gastro-esophageal reflux disease without esophagitis: Secondary | ICD-10-CM | POA: Diagnosis not present

## 2019-03-30 DIAGNOSIS — E1142 Type 2 diabetes mellitus with diabetic polyneuropathy: Secondary | ICD-10-CM | POA: Diagnosis not present

## 2019-03-31 DIAGNOSIS — D509 Iron deficiency anemia, unspecified: Secondary | ICD-10-CM | POA: Diagnosis not present

## 2019-03-31 DIAGNOSIS — D631 Anemia in chronic kidney disease: Secondary | ICD-10-CM | POA: Diagnosis not present

## 2019-03-31 DIAGNOSIS — N2581 Secondary hyperparathyroidism of renal origin: Secondary | ICD-10-CM | POA: Diagnosis not present

## 2019-03-31 DIAGNOSIS — Z992 Dependence on renal dialysis: Secondary | ICD-10-CM | POA: Diagnosis not present

## 2019-03-31 DIAGNOSIS — N186 End stage renal disease: Secondary | ICD-10-CM | POA: Diagnosis not present

## 2019-03-31 DIAGNOSIS — E1122 Type 2 diabetes mellitus with diabetic chronic kidney disease: Secondary | ICD-10-CM | POA: Diagnosis not present

## 2019-04-02 DIAGNOSIS — L03116 Cellulitis of left lower limb: Secondary | ICD-10-CM | POA: Diagnosis not present

## 2019-04-02 DIAGNOSIS — E1169 Type 2 diabetes mellitus with other specified complication: Secondary | ICD-10-CM | POA: Diagnosis not present

## 2019-04-02 DIAGNOSIS — M86172 Other acute osteomyelitis, left ankle and foot: Secondary | ICD-10-CM | POA: Diagnosis not present

## 2019-04-02 DIAGNOSIS — M86171 Other acute osteomyelitis, right ankle and foot: Secondary | ICD-10-CM | POA: Diagnosis not present

## 2019-04-02 DIAGNOSIS — I509 Heart failure, unspecified: Secondary | ICD-10-CM | POA: Diagnosis not present

## 2019-04-02 DIAGNOSIS — Z4781 Encounter for orthopedic aftercare following surgical amputation: Secondary | ICD-10-CM | POA: Diagnosis not present

## 2019-04-02 DIAGNOSIS — I132 Hypertensive heart and chronic kidney disease with heart failure and with stage 5 chronic kidney disease, or end stage renal disease: Secondary | ICD-10-CM | POA: Diagnosis not present

## 2019-04-02 DIAGNOSIS — Z792 Long term (current) use of antibiotics: Secondary | ICD-10-CM | POA: Diagnosis not present

## 2019-04-03 DIAGNOSIS — D509 Iron deficiency anemia, unspecified: Secondary | ICD-10-CM | POA: Diagnosis not present

## 2019-04-03 DIAGNOSIS — D631 Anemia in chronic kidney disease: Secondary | ICD-10-CM | POA: Diagnosis not present

## 2019-04-03 DIAGNOSIS — N2581 Secondary hyperparathyroidism of renal origin: Secondary | ICD-10-CM | POA: Diagnosis not present

## 2019-04-03 DIAGNOSIS — Z992 Dependence on renal dialysis: Secondary | ICD-10-CM | POA: Diagnosis not present

## 2019-04-03 DIAGNOSIS — E1122 Type 2 diabetes mellitus with diabetic chronic kidney disease: Secondary | ICD-10-CM | POA: Diagnosis not present

## 2019-04-03 DIAGNOSIS — N186 End stage renal disease: Secondary | ICD-10-CM | POA: Diagnosis not present

## 2019-04-04 DIAGNOSIS — A499 Bacterial infection, unspecified: Secondary | ICD-10-CM | POA: Diagnosis not present

## 2019-04-04 DIAGNOSIS — Z95828 Presence of other vascular implants and grafts: Secondary | ICD-10-CM | POA: Diagnosis not present

## 2019-04-04 DIAGNOSIS — Z794 Long term (current) use of insulin: Secondary | ICD-10-CM | POA: Diagnosis not present

## 2019-04-04 DIAGNOSIS — A498 Other bacterial infections of unspecified site: Secondary | ICD-10-CM | POA: Diagnosis not present

## 2019-04-04 DIAGNOSIS — M86672 Other chronic osteomyelitis, left ankle and foot: Secondary | ICD-10-CM | POA: Diagnosis not present

## 2019-04-04 DIAGNOSIS — E1142 Type 2 diabetes mellitus with diabetic polyneuropathy: Secondary | ICD-10-CM | POA: Diagnosis not present

## 2019-04-04 DIAGNOSIS — N186 End stage renal disease: Secondary | ICD-10-CM | POA: Diagnosis not present

## 2019-04-04 DIAGNOSIS — E1165 Type 2 diabetes mellitus with hyperglycemia: Secondary | ICD-10-CM | POA: Diagnosis not present

## 2019-04-04 DIAGNOSIS — E11628 Type 2 diabetes mellitus with other skin complications: Secondary | ICD-10-CM | POA: Diagnosis not present

## 2019-04-04 DIAGNOSIS — Z792 Long term (current) use of antibiotics: Secondary | ICD-10-CM | POA: Diagnosis not present

## 2019-04-04 DIAGNOSIS — L089 Local infection of the skin and subcutaneous tissue, unspecified: Secondary | ICD-10-CM | POA: Diagnosis not present

## 2019-04-04 DIAGNOSIS — E1122 Type 2 diabetes mellitus with diabetic chronic kidney disease: Secondary | ICD-10-CM | POA: Diagnosis not present

## 2019-04-05 DIAGNOSIS — E1129 Type 2 diabetes mellitus with other diabetic kidney complication: Secondary | ICD-10-CM | POA: Diagnosis not present

## 2019-04-05 DIAGNOSIS — E1122 Type 2 diabetes mellitus with diabetic chronic kidney disease: Secondary | ICD-10-CM | POA: Diagnosis not present

## 2019-04-05 DIAGNOSIS — Z23 Encounter for immunization: Secondary | ICD-10-CM | POA: Diagnosis not present

## 2019-04-05 DIAGNOSIS — D509 Iron deficiency anemia, unspecified: Secondary | ICD-10-CM | POA: Diagnosis not present

## 2019-04-05 DIAGNOSIS — Z992 Dependence on renal dialysis: Secondary | ICD-10-CM | POA: Diagnosis not present

## 2019-04-05 DIAGNOSIS — N186 End stage renal disease: Secondary | ICD-10-CM | POA: Diagnosis not present

## 2019-04-05 DIAGNOSIS — N2581 Secondary hyperparathyroidism of renal origin: Secondary | ICD-10-CM | POA: Diagnosis not present

## 2019-04-05 DIAGNOSIS — D631 Anemia in chronic kidney disease: Secondary | ICD-10-CM | POA: Diagnosis not present

## 2019-04-07 DIAGNOSIS — D631 Anemia in chronic kidney disease: Secondary | ICD-10-CM | POA: Diagnosis not present

## 2019-04-07 DIAGNOSIS — N2581 Secondary hyperparathyroidism of renal origin: Secondary | ICD-10-CM | POA: Diagnosis not present

## 2019-04-07 DIAGNOSIS — E1122 Type 2 diabetes mellitus with diabetic chronic kidney disease: Secondary | ICD-10-CM | POA: Diagnosis not present

## 2019-04-07 DIAGNOSIS — Z992 Dependence on renal dialysis: Secondary | ICD-10-CM | POA: Diagnosis not present

## 2019-04-07 DIAGNOSIS — N186 End stage renal disease: Secondary | ICD-10-CM | POA: Diagnosis not present

## 2019-04-07 DIAGNOSIS — D509 Iron deficiency anemia, unspecified: Secondary | ICD-10-CM | POA: Diagnosis not present

## 2019-04-10 DIAGNOSIS — D631 Anemia in chronic kidney disease: Secondary | ICD-10-CM | POA: Diagnosis not present

## 2019-04-10 DIAGNOSIS — Z992 Dependence on renal dialysis: Secondary | ICD-10-CM | POA: Diagnosis not present

## 2019-04-10 DIAGNOSIS — E1122 Type 2 diabetes mellitus with diabetic chronic kidney disease: Secondary | ICD-10-CM | POA: Diagnosis not present

## 2019-04-10 DIAGNOSIS — D509 Iron deficiency anemia, unspecified: Secondary | ICD-10-CM | POA: Diagnosis not present

## 2019-04-10 DIAGNOSIS — N186 End stage renal disease: Secondary | ICD-10-CM | POA: Diagnosis not present

## 2019-04-10 DIAGNOSIS — N2581 Secondary hyperparathyroidism of renal origin: Secondary | ICD-10-CM | POA: Diagnosis not present

## 2019-04-11 DIAGNOSIS — E1169 Type 2 diabetes mellitus with other specified complication: Secondary | ICD-10-CM | POA: Diagnosis not present

## 2019-04-11 DIAGNOSIS — Z4781 Encounter for orthopedic aftercare following surgical amputation: Secondary | ICD-10-CM | POA: Diagnosis not present

## 2019-04-11 DIAGNOSIS — L03116 Cellulitis of left lower limb: Secondary | ICD-10-CM | POA: Diagnosis not present

## 2019-04-11 DIAGNOSIS — I509 Heart failure, unspecified: Secondary | ICD-10-CM | POA: Diagnosis not present

## 2019-04-11 DIAGNOSIS — M86172 Other acute osteomyelitis, left ankle and foot: Secondary | ICD-10-CM | POA: Diagnosis not present

## 2019-04-11 DIAGNOSIS — I132 Hypertensive heart and chronic kidney disease with heart failure and with stage 5 chronic kidney disease, or end stage renal disease: Secondary | ICD-10-CM | POA: Diagnosis not present

## 2019-04-11 IMAGING — DX DG CHEST 2V
2 series · 2 of 2 positions shown · non-contrast
Comparison: Single-view of the chest 01/04/2018, 12/31/2017 and
05/02/2017. CT chest 12/17/2017.

CLINICAL DATA: Chronic shortness of breath.

EXAM:
CHEST - 2 VIEW

[chest pa]
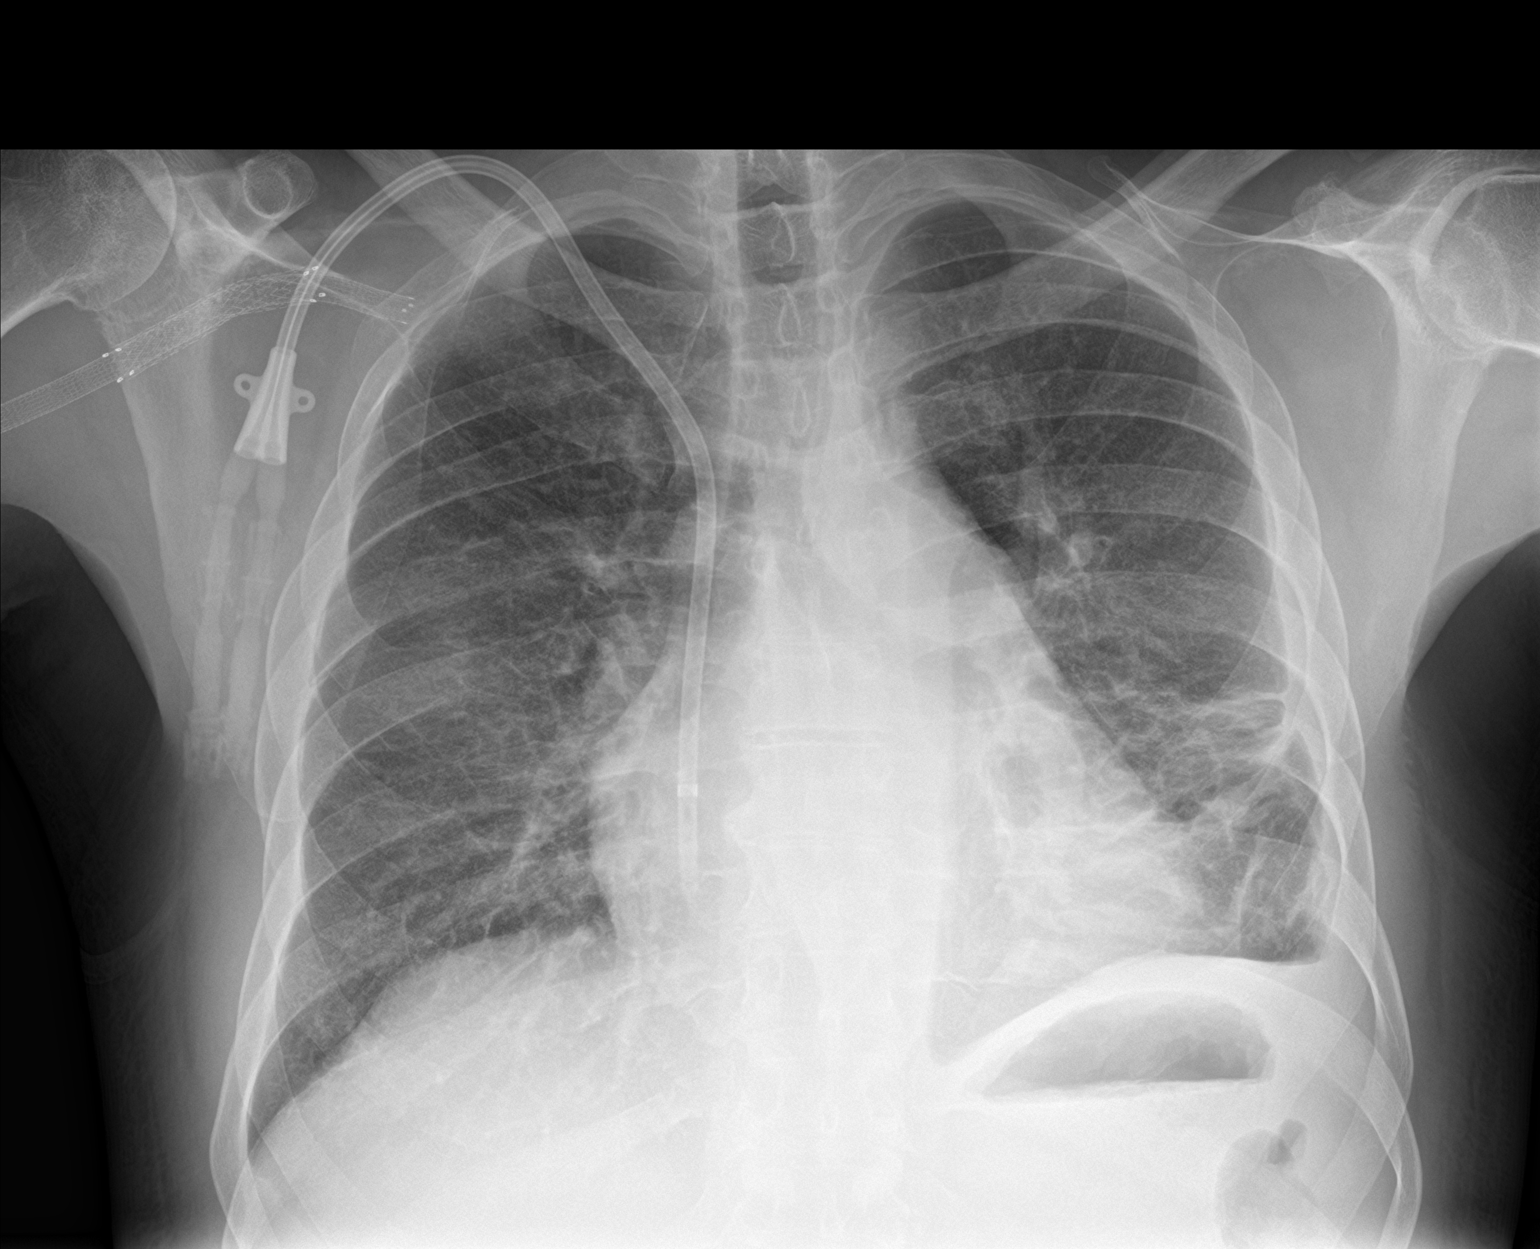

[chest lat]
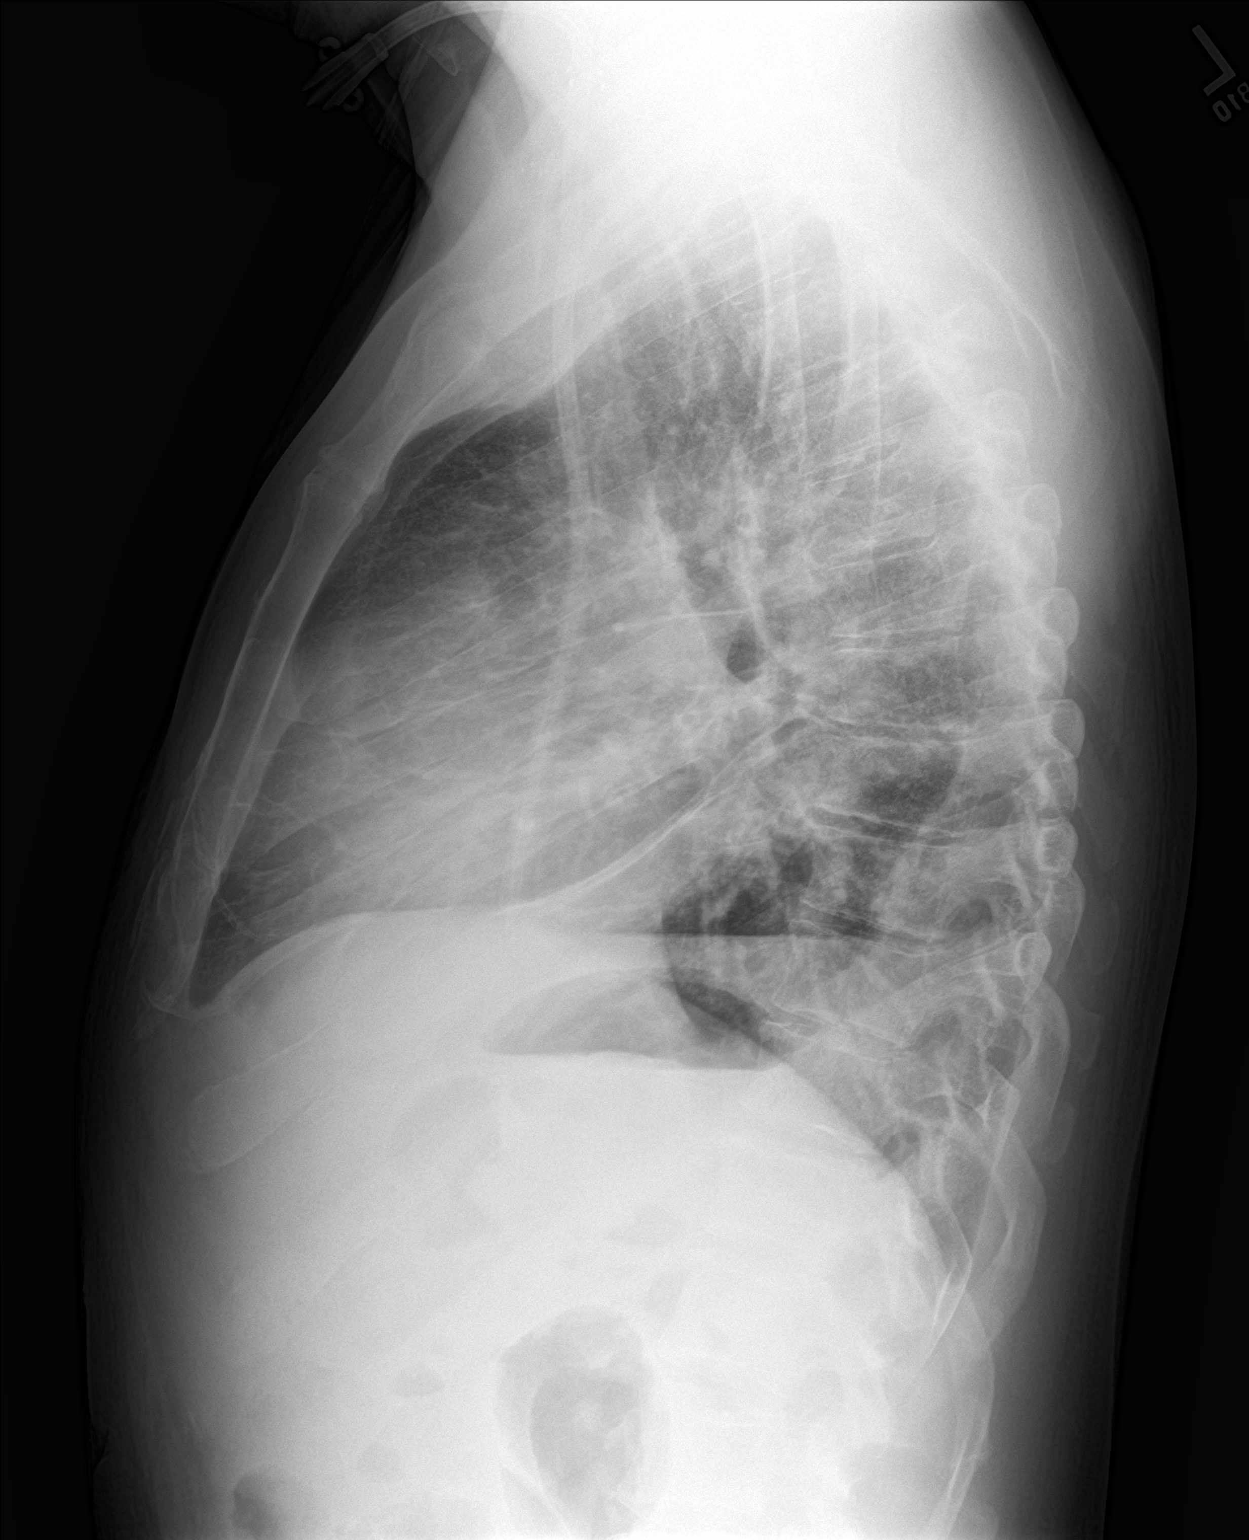

[2 of 2 positions shown; findings below may reference images not displayed]

FINDINGS: Left basilar scarring appears unchanged. The lungs are otherwise
clear. No pneumothorax. There may be a trace left pleural effusion.
Heart size is upper normal. Dialysis catheter is in place. Vascular
stent right axilla noted.
IMPRESSION: No acute disease.

## 2019-04-12 DIAGNOSIS — Z992 Dependence on renal dialysis: Secondary | ICD-10-CM | POA: Diagnosis not present

## 2019-04-12 DIAGNOSIS — N2581 Secondary hyperparathyroidism of renal origin: Secondary | ICD-10-CM | POA: Diagnosis not present

## 2019-04-12 DIAGNOSIS — I132 Hypertensive heart and chronic kidney disease with heart failure and with stage 5 chronic kidney disease, or end stage renal disease: Secondary | ICD-10-CM | POA: Diagnosis not present

## 2019-04-12 DIAGNOSIS — Z792 Long term (current) use of antibiotics: Secondary | ICD-10-CM | POA: Diagnosis not present

## 2019-04-12 DIAGNOSIS — Z4781 Encounter for orthopedic aftercare following surgical amputation: Secondary | ICD-10-CM | POA: Diagnosis not present

## 2019-04-12 DIAGNOSIS — E1169 Type 2 diabetes mellitus with other specified complication: Secondary | ICD-10-CM | POA: Diagnosis not present

## 2019-04-12 DIAGNOSIS — M86172 Other acute osteomyelitis, left ankle and foot: Secondary | ICD-10-CM | POA: Diagnosis not present

## 2019-04-12 DIAGNOSIS — D509 Iron deficiency anemia, unspecified: Secondary | ICD-10-CM | POA: Diagnosis not present

## 2019-04-12 DIAGNOSIS — L03116 Cellulitis of left lower limb: Secondary | ICD-10-CM | POA: Diagnosis not present

## 2019-04-12 DIAGNOSIS — E1069 Type 1 diabetes mellitus with other specified complication: Secondary | ICD-10-CM | POA: Diagnosis not present

## 2019-04-12 DIAGNOSIS — I509 Heart failure, unspecified: Secondary | ICD-10-CM | POA: Diagnosis not present

## 2019-04-12 DIAGNOSIS — D631 Anemia in chronic kidney disease: Secondary | ICD-10-CM | POA: Diagnosis not present

## 2019-04-12 DIAGNOSIS — E1122 Type 2 diabetes mellitus with diabetic chronic kidney disease: Secondary | ICD-10-CM | POA: Diagnosis not present

## 2019-04-12 DIAGNOSIS — N186 End stage renal disease: Secondary | ICD-10-CM | POA: Diagnosis not present

## 2019-04-14 DIAGNOSIS — N2581 Secondary hyperparathyroidism of renal origin: Secondary | ICD-10-CM | POA: Diagnosis not present

## 2019-04-14 DIAGNOSIS — N186 End stage renal disease: Secondary | ICD-10-CM | POA: Diagnosis not present

## 2019-04-14 DIAGNOSIS — D631 Anemia in chronic kidney disease: Secondary | ICD-10-CM | POA: Diagnosis not present

## 2019-04-14 DIAGNOSIS — Z992 Dependence on renal dialysis: Secondary | ICD-10-CM | POA: Diagnosis not present

## 2019-04-14 DIAGNOSIS — E1122 Type 2 diabetes mellitus with diabetic chronic kidney disease: Secondary | ICD-10-CM | POA: Diagnosis not present

## 2019-04-14 DIAGNOSIS — D509 Iron deficiency anemia, unspecified: Secondary | ICD-10-CM | POA: Diagnosis not present

## 2019-04-16 DIAGNOSIS — M86172 Other acute osteomyelitis, left ankle and foot: Secondary | ICD-10-CM | POA: Diagnosis not present

## 2019-04-16 DIAGNOSIS — I509 Heart failure, unspecified: Secondary | ICD-10-CM | POA: Diagnosis not present

## 2019-04-16 DIAGNOSIS — E1169 Type 2 diabetes mellitus with other specified complication: Secondary | ICD-10-CM | POA: Diagnosis not present

## 2019-04-16 DIAGNOSIS — L03116 Cellulitis of left lower limb: Secondary | ICD-10-CM | POA: Diagnosis not present

## 2019-04-16 DIAGNOSIS — I132 Hypertensive heart and chronic kidney disease with heart failure and with stage 5 chronic kidney disease, or end stage renal disease: Secondary | ICD-10-CM | POA: Diagnosis not present

## 2019-04-16 DIAGNOSIS — Z4781 Encounter for orthopedic aftercare following surgical amputation: Secondary | ICD-10-CM | POA: Diagnosis not present

## 2019-04-17 DIAGNOSIS — D631 Anemia in chronic kidney disease: Secondary | ICD-10-CM | POA: Diagnosis not present

## 2019-04-17 DIAGNOSIS — N2581 Secondary hyperparathyroidism of renal origin: Secondary | ICD-10-CM | POA: Diagnosis not present

## 2019-04-17 DIAGNOSIS — E1122 Type 2 diabetes mellitus with diabetic chronic kidney disease: Secondary | ICD-10-CM | POA: Diagnosis not present

## 2019-04-17 DIAGNOSIS — Z992 Dependence on renal dialysis: Secondary | ICD-10-CM | POA: Diagnosis not present

## 2019-04-17 DIAGNOSIS — N186 End stage renal disease: Secondary | ICD-10-CM | POA: Diagnosis not present

## 2019-04-17 DIAGNOSIS — D509 Iron deficiency anemia, unspecified: Secondary | ICD-10-CM | POA: Diagnosis not present

## 2019-04-19 DIAGNOSIS — N2581 Secondary hyperparathyroidism of renal origin: Secondary | ICD-10-CM | POA: Diagnosis not present

## 2019-04-19 DIAGNOSIS — Z992 Dependence on renal dialysis: Secondary | ICD-10-CM | POA: Diagnosis not present

## 2019-04-19 DIAGNOSIS — E1122 Type 2 diabetes mellitus with diabetic chronic kidney disease: Secondary | ICD-10-CM | POA: Diagnosis not present

## 2019-04-19 DIAGNOSIS — D509 Iron deficiency anemia, unspecified: Secondary | ICD-10-CM | POA: Diagnosis not present

## 2019-04-19 DIAGNOSIS — D631 Anemia in chronic kidney disease: Secondary | ICD-10-CM | POA: Diagnosis not present

## 2019-04-19 DIAGNOSIS — N186 End stage renal disease: Secondary | ICD-10-CM | POA: Diagnosis not present

## 2019-04-21 DIAGNOSIS — D631 Anemia in chronic kidney disease: Secondary | ICD-10-CM | POA: Diagnosis not present

## 2019-04-21 DIAGNOSIS — E1122 Type 2 diabetes mellitus with diabetic chronic kidney disease: Secondary | ICD-10-CM | POA: Diagnosis not present

## 2019-04-21 DIAGNOSIS — N186 End stage renal disease: Secondary | ICD-10-CM | POA: Diagnosis not present

## 2019-04-21 DIAGNOSIS — D509 Iron deficiency anemia, unspecified: Secondary | ICD-10-CM | POA: Diagnosis not present

## 2019-04-21 DIAGNOSIS — N2581 Secondary hyperparathyroidism of renal origin: Secondary | ICD-10-CM | POA: Diagnosis not present

## 2019-04-21 DIAGNOSIS — Z992 Dependence on renal dialysis: Secondary | ICD-10-CM | POA: Diagnosis not present

## 2019-04-24 DIAGNOSIS — N2581 Secondary hyperparathyroidism of renal origin: Secondary | ICD-10-CM | POA: Diagnosis not present

## 2019-04-24 DIAGNOSIS — M86172 Other acute osteomyelitis, left ankle and foot: Secondary | ICD-10-CM | POA: Diagnosis not present

## 2019-04-24 DIAGNOSIS — Z992 Dependence on renal dialysis: Secondary | ICD-10-CM | POA: Diagnosis not present

## 2019-04-24 DIAGNOSIS — L03116 Cellulitis of left lower limb: Secondary | ICD-10-CM | POA: Diagnosis not present

## 2019-04-24 DIAGNOSIS — Z4781 Encounter for orthopedic aftercare following surgical amputation: Secondary | ICD-10-CM | POA: Diagnosis not present

## 2019-04-24 DIAGNOSIS — E1169 Type 2 diabetes mellitus with other specified complication: Secondary | ICD-10-CM | POA: Diagnosis not present

## 2019-04-24 DIAGNOSIS — E1122 Type 2 diabetes mellitus with diabetic chronic kidney disease: Secondary | ICD-10-CM | POA: Diagnosis not present

## 2019-04-24 DIAGNOSIS — N186 End stage renal disease: Secondary | ICD-10-CM | POA: Diagnosis not present

## 2019-04-24 DIAGNOSIS — I509 Heart failure, unspecified: Secondary | ICD-10-CM | POA: Diagnosis not present

## 2019-04-24 DIAGNOSIS — D631 Anemia in chronic kidney disease: Secondary | ICD-10-CM | POA: Diagnosis not present

## 2019-04-24 DIAGNOSIS — Z791 Long term (current) use of non-steroidal anti-inflammatories (NSAID): Secondary | ICD-10-CM | POA: Diagnosis not present

## 2019-04-24 DIAGNOSIS — I132 Hypertensive heart and chronic kidney disease with heart failure and with stage 5 chronic kidney disease, or end stage renal disease: Secondary | ICD-10-CM | POA: Diagnosis not present

## 2019-04-24 DIAGNOSIS — D509 Iron deficiency anemia, unspecified: Secondary | ICD-10-CM | POA: Diagnosis not present

## 2019-04-25 DIAGNOSIS — Z95828 Presence of other vascular implants and grafts: Secondary | ICD-10-CM | POA: Diagnosis not present

## 2019-04-25 DIAGNOSIS — A498 Other bacterial infections of unspecified site: Secondary | ICD-10-CM | POA: Diagnosis not present

## 2019-04-25 DIAGNOSIS — E1142 Type 2 diabetes mellitus with diabetic polyneuropathy: Secondary | ICD-10-CM | POA: Diagnosis not present

## 2019-04-25 DIAGNOSIS — E11628 Type 2 diabetes mellitus with other skin complications: Secondary | ICD-10-CM | POA: Diagnosis not present

## 2019-04-25 DIAGNOSIS — N186 End stage renal disease: Secondary | ICD-10-CM | POA: Diagnosis not present

## 2019-04-25 DIAGNOSIS — M86672 Other chronic osteomyelitis, left ankle and foot: Secondary | ICD-10-CM | POA: Diagnosis not present

## 2019-04-25 DIAGNOSIS — A499 Bacterial infection, unspecified: Secondary | ICD-10-CM | POA: Diagnosis not present

## 2019-04-25 DIAGNOSIS — E1165 Type 2 diabetes mellitus with hyperglycemia: Secondary | ICD-10-CM | POA: Diagnosis not present

## 2019-04-25 DIAGNOSIS — Z792 Long term (current) use of antibiotics: Secondary | ICD-10-CM | POA: Diagnosis not present

## 2019-04-25 DIAGNOSIS — E1122 Type 2 diabetes mellitus with diabetic chronic kidney disease: Secondary | ICD-10-CM | POA: Diagnosis not present

## 2019-04-25 DIAGNOSIS — Z794 Long term (current) use of insulin: Secondary | ICD-10-CM | POA: Diagnosis not present

## 2019-04-25 DIAGNOSIS — L089 Local infection of the skin and subcutaneous tissue, unspecified: Secondary | ICD-10-CM | POA: Diagnosis not present

## 2019-04-26 DIAGNOSIS — N2581 Secondary hyperparathyroidism of renal origin: Secondary | ICD-10-CM | POA: Diagnosis not present

## 2019-04-26 DIAGNOSIS — D509 Iron deficiency anemia, unspecified: Secondary | ICD-10-CM | POA: Diagnosis not present

## 2019-04-26 DIAGNOSIS — E1122 Type 2 diabetes mellitus with diabetic chronic kidney disease: Secondary | ICD-10-CM | POA: Diagnosis not present

## 2019-04-26 DIAGNOSIS — N186 End stage renal disease: Secondary | ICD-10-CM | POA: Diagnosis not present

## 2019-04-26 DIAGNOSIS — D631 Anemia in chronic kidney disease: Secondary | ICD-10-CM | POA: Diagnosis not present

## 2019-04-26 DIAGNOSIS — Z992 Dependence on renal dialysis: Secondary | ICD-10-CM | POA: Diagnosis not present

## 2019-04-28 DIAGNOSIS — E1122 Type 2 diabetes mellitus with diabetic chronic kidney disease: Secondary | ICD-10-CM | POA: Diagnosis not present

## 2019-04-28 DIAGNOSIS — D509 Iron deficiency anemia, unspecified: Secondary | ICD-10-CM | POA: Diagnosis not present

## 2019-04-28 DIAGNOSIS — N2581 Secondary hyperparathyroidism of renal origin: Secondary | ICD-10-CM | POA: Diagnosis not present

## 2019-04-28 DIAGNOSIS — D631 Anemia in chronic kidney disease: Secondary | ICD-10-CM | POA: Diagnosis not present

## 2019-04-28 DIAGNOSIS — Z992 Dependence on renal dialysis: Secondary | ICD-10-CM | POA: Diagnosis not present

## 2019-04-28 DIAGNOSIS — N186 End stage renal disease: Secondary | ICD-10-CM | POA: Diagnosis not present

## 2019-04-29 DIAGNOSIS — Z792 Long term (current) use of antibiotics: Secondary | ICD-10-CM | POA: Diagnosis not present

## 2019-04-29 DIAGNOSIS — Z7982 Long term (current) use of aspirin: Secondary | ICD-10-CM | POA: Diagnosis not present

## 2019-04-29 DIAGNOSIS — Z4781 Encounter for orthopedic aftercare following surgical amputation: Secondary | ICD-10-CM | POA: Diagnosis not present

## 2019-04-29 DIAGNOSIS — Z452 Encounter for adjustment and management of vascular access device: Secondary | ICD-10-CM | POA: Diagnosis not present

## 2019-04-29 DIAGNOSIS — I132 Hypertensive heart and chronic kidney disease with heart failure and with stage 5 chronic kidney disease, or end stage renal disease: Secondary | ICD-10-CM | POA: Diagnosis not present

## 2019-04-29 DIAGNOSIS — Z794 Long term (current) use of insulin: Secondary | ICD-10-CM | POA: Diagnosis not present

## 2019-04-29 DIAGNOSIS — Z992 Dependence on renal dialysis: Secondary | ICD-10-CM | POA: Diagnosis not present

## 2019-04-29 DIAGNOSIS — D631 Anemia in chronic kidney disease: Secondary | ICD-10-CM | POA: Diagnosis not present

## 2019-04-29 DIAGNOSIS — I272 Pulmonary hypertension, unspecified: Secondary | ICD-10-CM | POA: Diagnosis not present

## 2019-04-29 DIAGNOSIS — E1136 Type 2 diabetes mellitus with diabetic cataract: Secondary | ICD-10-CM | POA: Diagnosis not present

## 2019-04-29 DIAGNOSIS — I70209 Unspecified atherosclerosis of native arteries of extremities, unspecified extremity: Secondary | ICD-10-CM | POA: Diagnosis not present

## 2019-04-29 DIAGNOSIS — E1142 Type 2 diabetes mellitus with diabetic polyneuropathy: Secondary | ICD-10-CM | POA: Diagnosis not present

## 2019-04-29 DIAGNOSIS — Z89422 Acquired absence of other left toe(s): Secondary | ICD-10-CM | POA: Diagnosis not present

## 2019-04-29 DIAGNOSIS — I509 Heart failure, unspecified: Secondary | ICD-10-CM | POA: Diagnosis not present

## 2019-04-29 DIAGNOSIS — K219 Gastro-esophageal reflux disease without esophagitis: Secondary | ICD-10-CM | POA: Diagnosis not present

## 2019-04-29 DIAGNOSIS — L03116 Cellulitis of left lower limb: Secondary | ICD-10-CM | POA: Diagnosis not present

## 2019-04-29 DIAGNOSIS — Z8614 Personal history of Methicillin resistant Staphylococcus aureus infection: Secondary | ICD-10-CM | POA: Diagnosis not present

## 2019-04-29 DIAGNOSIS — Z9181 History of falling: Secondary | ICD-10-CM | POA: Diagnosis not present

## 2019-04-29 DIAGNOSIS — N186 End stage renal disease: Secondary | ICD-10-CM | POA: Diagnosis not present

## 2019-04-29 DIAGNOSIS — E1122 Type 2 diabetes mellitus with diabetic chronic kidney disease: Secondary | ICD-10-CM | POA: Diagnosis not present

## 2019-04-29 DIAGNOSIS — G40909 Epilepsy, unspecified, not intractable, without status epilepticus: Secondary | ICD-10-CM | POA: Diagnosis not present

## 2019-04-29 DIAGNOSIS — J984 Other disorders of lung: Secondary | ICD-10-CM | POA: Diagnosis not present

## 2019-04-29 DIAGNOSIS — E1151 Type 2 diabetes mellitus with diabetic peripheral angiopathy without gangrene: Secondary | ICD-10-CM | POA: Diagnosis not present

## 2019-04-29 DIAGNOSIS — M86172 Other acute osteomyelitis, left ankle and foot: Secondary | ICD-10-CM | POA: Diagnosis not present

## 2019-04-29 DIAGNOSIS — E1169 Type 2 diabetes mellitus with other specified complication: Secondary | ICD-10-CM | POA: Diagnosis not present

## 2019-04-30 DIAGNOSIS — M868X7 Other osteomyelitis, ankle and foot: Secondary | ICD-10-CM | POA: Diagnosis not present

## 2019-04-30 DIAGNOSIS — Z452 Encounter for adjustment and management of vascular access device: Secondary | ICD-10-CM | POA: Diagnosis not present

## 2019-04-30 DIAGNOSIS — Z79899 Other long term (current) drug therapy: Secondary | ICD-10-CM | POA: Diagnosis not present

## 2019-05-01 DIAGNOSIS — Z992 Dependence on renal dialysis: Secondary | ICD-10-CM | POA: Diagnosis not present

## 2019-05-01 DIAGNOSIS — D509 Iron deficiency anemia, unspecified: Secondary | ICD-10-CM | POA: Diagnosis not present

## 2019-05-01 DIAGNOSIS — D631 Anemia in chronic kidney disease: Secondary | ICD-10-CM | POA: Diagnosis not present

## 2019-05-01 DIAGNOSIS — N2581 Secondary hyperparathyroidism of renal origin: Secondary | ICD-10-CM | POA: Diagnosis not present

## 2019-05-01 DIAGNOSIS — E1122 Type 2 diabetes mellitus with diabetic chronic kidney disease: Secondary | ICD-10-CM | POA: Diagnosis not present

## 2019-05-01 DIAGNOSIS — N186 End stage renal disease: Secondary | ICD-10-CM | POA: Diagnosis not present

## 2019-05-02 DIAGNOSIS — E1169 Type 2 diabetes mellitus with other specified complication: Secondary | ICD-10-CM | POA: Diagnosis not present

## 2019-05-02 DIAGNOSIS — I132 Hypertensive heart and chronic kidney disease with heart failure and with stage 5 chronic kidney disease, or end stage renal disease: Secondary | ICD-10-CM | POA: Diagnosis not present

## 2019-05-02 DIAGNOSIS — L03116 Cellulitis of left lower limb: Secondary | ICD-10-CM | POA: Diagnosis not present

## 2019-05-02 DIAGNOSIS — I509 Heart failure, unspecified: Secondary | ICD-10-CM | POA: Diagnosis not present

## 2019-05-02 DIAGNOSIS — Z4781 Encounter for orthopedic aftercare following surgical amputation: Secondary | ICD-10-CM | POA: Diagnosis not present

## 2019-05-02 DIAGNOSIS — M86172 Other acute osteomyelitis, left ankle and foot: Secondary | ICD-10-CM | POA: Diagnosis not present

## 2019-05-03 DIAGNOSIS — E1122 Type 2 diabetes mellitus with diabetic chronic kidney disease: Secondary | ICD-10-CM | POA: Diagnosis not present

## 2019-05-03 DIAGNOSIS — D509 Iron deficiency anemia, unspecified: Secondary | ICD-10-CM | POA: Diagnosis not present

## 2019-05-03 DIAGNOSIS — D631 Anemia in chronic kidney disease: Secondary | ICD-10-CM | POA: Diagnosis not present

## 2019-05-03 DIAGNOSIS — Z992 Dependence on renal dialysis: Secondary | ICD-10-CM | POA: Diagnosis not present

## 2019-05-03 DIAGNOSIS — N186 End stage renal disease: Secondary | ICD-10-CM | POA: Diagnosis not present

## 2019-05-03 DIAGNOSIS — N2581 Secondary hyperparathyroidism of renal origin: Secondary | ICD-10-CM | POA: Diagnosis not present

## 2019-05-05 DIAGNOSIS — D631 Anemia in chronic kidney disease: Secondary | ICD-10-CM | POA: Diagnosis not present

## 2019-05-05 DIAGNOSIS — E1122 Type 2 diabetes mellitus with diabetic chronic kidney disease: Secondary | ICD-10-CM | POA: Diagnosis not present

## 2019-05-05 DIAGNOSIS — N186 End stage renal disease: Secondary | ICD-10-CM | POA: Diagnosis not present

## 2019-05-05 DIAGNOSIS — Z992 Dependence on renal dialysis: Secondary | ICD-10-CM | POA: Diagnosis not present

## 2019-05-05 DIAGNOSIS — D509 Iron deficiency anemia, unspecified: Secondary | ICD-10-CM | POA: Diagnosis not present

## 2019-05-05 DIAGNOSIS — N2581 Secondary hyperparathyroidism of renal origin: Secondary | ICD-10-CM | POA: Diagnosis not present

## 2019-05-06 DIAGNOSIS — E1129 Type 2 diabetes mellitus with other diabetic kidney complication: Secondary | ICD-10-CM | POA: Diagnosis not present

## 2019-05-06 DIAGNOSIS — Z992 Dependence on renal dialysis: Secondary | ICD-10-CM | POA: Diagnosis not present

## 2019-05-06 DIAGNOSIS — N186 End stage renal disease: Secondary | ICD-10-CM | POA: Diagnosis not present

## 2019-05-07 DIAGNOSIS — Z4781 Encounter for orthopedic aftercare following surgical amputation: Secondary | ICD-10-CM | POA: Diagnosis not present

## 2019-05-07 DIAGNOSIS — L03116 Cellulitis of left lower limb: Secondary | ICD-10-CM | POA: Diagnosis not present

## 2019-05-07 DIAGNOSIS — I509 Heart failure, unspecified: Secondary | ICD-10-CM | POA: Diagnosis not present

## 2019-05-07 DIAGNOSIS — E1169 Type 2 diabetes mellitus with other specified complication: Secondary | ICD-10-CM | POA: Diagnosis not present

## 2019-05-07 DIAGNOSIS — I132 Hypertensive heart and chronic kidney disease with heart failure and with stage 5 chronic kidney disease, or end stage renal disease: Secondary | ICD-10-CM | POA: Diagnosis not present

## 2019-05-07 DIAGNOSIS — M86172 Other acute osteomyelitis, left ankle and foot: Secondary | ICD-10-CM | POA: Diagnosis not present

## 2019-05-08 DIAGNOSIS — L97523 Non-pressure chronic ulcer of other part of left foot with necrosis of muscle: Secondary | ICD-10-CM | POA: Diagnosis not present

## 2019-05-08 DIAGNOSIS — L97425 Non-pressure chronic ulcer of left heel and midfoot with muscle involvement without evidence of necrosis: Secondary | ICD-10-CM | POA: Diagnosis not present

## 2019-05-08 DIAGNOSIS — E1140 Type 2 diabetes mellitus with diabetic neuropathy, unspecified: Secondary | ICD-10-CM | POA: Diagnosis not present

## 2019-05-08 DIAGNOSIS — N2581 Secondary hyperparathyroidism of renal origin: Secondary | ICD-10-CM | POA: Diagnosis not present

## 2019-05-08 DIAGNOSIS — Z992 Dependence on renal dialysis: Secondary | ICD-10-CM | POA: Diagnosis not present

## 2019-05-08 DIAGNOSIS — D509 Iron deficiency anemia, unspecified: Secondary | ICD-10-CM | POA: Diagnosis not present

## 2019-05-08 DIAGNOSIS — D631 Anemia in chronic kidney disease: Secondary | ICD-10-CM | POA: Diagnosis not present

## 2019-05-08 DIAGNOSIS — L97522 Non-pressure chronic ulcer of other part of left foot with fat layer exposed: Secondary | ICD-10-CM | POA: Diagnosis not present

## 2019-05-08 DIAGNOSIS — I509 Heart failure, unspecified: Secondary | ICD-10-CM | POA: Diagnosis not present

## 2019-05-08 DIAGNOSIS — E11621 Type 2 diabetes mellitus with foot ulcer: Secondary | ICD-10-CM | POA: Diagnosis not present

## 2019-05-08 DIAGNOSIS — N186 End stage renal disease: Secondary | ICD-10-CM | POA: Diagnosis not present

## 2019-05-08 DIAGNOSIS — I739 Peripheral vascular disease, unspecified: Secondary | ICD-10-CM | POA: Diagnosis not present

## 2019-05-08 DIAGNOSIS — E1122 Type 2 diabetes mellitus with diabetic chronic kidney disease: Secondary | ICD-10-CM | POA: Diagnosis not present

## 2019-05-08 DIAGNOSIS — E1165 Type 2 diabetes mellitus with hyperglycemia: Secondary | ICD-10-CM | POA: Diagnosis not present

## 2019-05-10 DIAGNOSIS — Z992 Dependence on renal dialysis: Secondary | ICD-10-CM | POA: Diagnosis not present

## 2019-05-10 DIAGNOSIS — E1122 Type 2 diabetes mellitus with diabetic chronic kidney disease: Secondary | ICD-10-CM | POA: Diagnosis not present

## 2019-05-10 DIAGNOSIS — D631 Anemia in chronic kidney disease: Secondary | ICD-10-CM | POA: Diagnosis not present

## 2019-05-10 DIAGNOSIS — N2581 Secondary hyperparathyroidism of renal origin: Secondary | ICD-10-CM | POA: Diagnosis not present

## 2019-05-10 DIAGNOSIS — D509 Iron deficiency anemia, unspecified: Secondary | ICD-10-CM | POA: Diagnosis not present

## 2019-05-10 DIAGNOSIS — N186 End stage renal disease: Secondary | ICD-10-CM | POA: Diagnosis not present

## 2019-05-12 DIAGNOSIS — N2581 Secondary hyperparathyroidism of renal origin: Secondary | ICD-10-CM | POA: Diagnosis not present

## 2019-05-12 DIAGNOSIS — E1122 Type 2 diabetes mellitus with diabetic chronic kidney disease: Secondary | ICD-10-CM | POA: Diagnosis not present

## 2019-05-12 DIAGNOSIS — N186 End stage renal disease: Secondary | ICD-10-CM | POA: Diagnosis not present

## 2019-05-12 DIAGNOSIS — D509 Iron deficiency anemia, unspecified: Secondary | ICD-10-CM | POA: Diagnosis not present

## 2019-05-12 DIAGNOSIS — D631 Anemia in chronic kidney disease: Secondary | ICD-10-CM | POA: Diagnosis not present

## 2019-05-12 DIAGNOSIS — Z992 Dependence on renal dialysis: Secondary | ICD-10-CM | POA: Diagnosis not present

## 2019-05-14 DIAGNOSIS — L03116 Cellulitis of left lower limb: Secondary | ICD-10-CM | POA: Diagnosis not present

## 2019-05-14 DIAGNOSIS — E1169 Type 2 diabetes mellitus with other specified complication: Secondary | ICD-10-CM | POA: Diagnosis not present

## 2019-05-14 DIAGNOSIS — Z4781 Encounter for orthopedic aftercare following surgical amputation: Secondary | ICD-10-CM | POA: Diagnosis not present

## 2019-05-14 DIAGNOSIS — M86172 Other acute osteomyelitis, left ankle and foot: Secondary | ICD-10-CM | POA: Diagnosis not present

## 2019-05-14 DIAGNOSIS — I509 Heart failure, unspecified: Secondary | ICD-10-CM | POA: Diagnosis not present

## 2019-05-14 DIAGNOSIS — I132 Hypertensive heart and chronic kidney disease with heart failure and with stage 5 chronic kidney disease, or end stage renal disease: Secondary | ICD-10-CM | POA: Diagnosis not present

## 2019-05-15 DIAGNOSIS — N2581 Secondary hyperparathyroidism of renal origin: Secondary | ICD-10-CM | POA: Diagnosis not present

## 2019-05-15 DIAGNOSIS — L97522 Non-pressure chronic ulcer of other part of left foot with fat layer exposed: Secondary | ICD-10-CM | POA: Diagnosis not present

## 2019-05-15 DIAGNOSIS — Z992 Dependence on renal dialysis: Secondary | ICD-10-CM | POA: Diagnosis not present

## 2019-05-15 DIAGNOSIS — Z89422 Acquired absence of other left toe(s): Secondary | ICD-10-CM | POA: Diagnosis not present

## 2019-05-15 DIAGNOSIS — E1140 Type 2 diabetes mellitus with diabetic neuropathy, unspecified: Secondary | ICD-10-CM | POA: Diagnosis not present

## 2019-05-15 DIAGNOSIS — E1165 Type 2 diabetes mellitus with hyperglycemia: Secondary | ICD-10-CM | POA: Diagnosis not present

## 2019-05-15 DIAGNOSIS — E11621 Type 2 diabetes mellitus with foot ulcer: Secondary | ICD-10-CM | POA: Diagnosis not present

## 2019-05-15 DIAGNOSIS — E1122 Type 2 diabetes mellitus with diabetic chronic kidney disease: Secondary | ICD-10-CM | POA: Diagnosis not present

## 2019-05-15 DIAGNOSIS — L97523 Non-pressure chronic ulcer of other part of left foot with necrosis of muscle: Secondary | ICD-10-CM | POA: Diagnosis not present

## 2019-05-15 DIAGNOSIS — D509 Iron deficiency anemia, unspecified: Secondary | ICD-10-CM | POA: Diagnosis not present

## 2019-05-15 DIAGNOSIS — D631 Anemia in chronic kidney disease: Secondary | ICD-10-CM | POA: Diagnosis not present

## 2019-05-15 DIAGNOSIS — N186 End stage renal disease: Secondary | ICD-10-CM | POA: Diagnosis not present

## 2019-05-17 DIAGNOSIS — E1122 Type 2 diabetes mellitus with diabetic chronic kidney disease: Secondary | ICD-10-CM | POA: Diagnosis not present

## 2019-05-17 DIAGNOSIS — Z992 Dependence on renal dialysis: Secondary | ICD-10-CM | POA: Diagnosis not present

## 2019-05-17 DIAGNOSIS — D631 Anemia in chronic kidney disease: Secondary | ICD-10-CM | POA: Diagnosis not present

## 2019-05-17 DIAGNOSIS — N186 End stage renal disease: Secondary | ICD-10-CM | POA: Diagnosis not present

## 2019-05-17 DIAGNOSIS — N2581 Secondary hyperparathyroidism of renal origin: Secondary | ICD-10-CM | POA: Diagnosis not present

## 2019-05-17 DIAGNOSIS — D509 Iron deficiency anemia, unspecified: Secondary | ICD-10-CM | POA: Diagnosis not present

## 2019-05-19 DIAGNOSIS — D631 Anemia in chronic kidney disease: Secondary | ICD-10-CM | POA: Diagnosis not present

## 2019-05-19 DIAGNOSIS — N186 End stage renal disease: Secondary | ICD-10-CM | POA: Diagnosis not present

## 2019-05-19 DIAGNOSIS — D509 Iron deficiency anemia, unspecified: Secondary | ICD-10-CM | POA: Diagnosis not present

## 2019-05-19 DIAGNOSIS — Z992 Dependence on renal dialysis: Secondary | ICD-10-CM | POA: Diagnosis not present

## 2019-05-19 DIAGNOSIS — E1122 Type 2 diabetes mellitus with diabetic chronic kidney disease: Secondary | ICD-10-CM | POA: Diagnosis not present

## 2019-05-19 DIAGNOSIS — N2581 Secondary hyperparathyroidism of renal origin: Secondary | ICD-10-CM | POA: Diagnosis not present

## 2019-05-22 DIAGNOSIS — N186 End stage renal disease: Secondary | ICD-10-CM | POA: Diagnosis not present

## 2019-05-22 DIAGNOSIS — E1122 Type 2 diabetes mellitus with diabetic chronic kidney disease: Secondary | ICD-10-CM | POA: Diagnosis not present

## 2019-05-22 DIAGNOSIS — N2581 Secondary hyperparathyroidism of renal origin: Secondary | ICD-10-CM | POA: Diagnosis not present

## 2019-05-22 DIAGNOSIS — D509 Iron deficiency anemia, unspecified: Secondary | ICD-10-CM | POA: Diagnosis not present

## 2019-05-22 DIAGNOSIS — Z992 Dependence on renal dialysis: Secondary | ICD-10-CM | POA: Diagnosis not present

## 2019-05-22 DIAGNOSIS — D631 Anemia in chronic kidney disease: Secondary | ICD-10-CM | POA: Diagnosis not present

## 2019-05-24 DIAGNOSIS — D631 Anemia in chronic kidney disease: Secondary | ICD-10-CM | POA: Diagnosis not present

## 2019-05-24 DIAGNOSIS — E1122 Type 2 diabetes mellitus with diabetic chronic kidney disease: Secondary | ICD-10-CM | POA: Diagnosis not present

## 2019-05-24 DIAGNOSIS — N186 End stage renal disease: Secondary | ICD-10-CM | POA: Diagnosis not present

## 2019-05-24 DIAGNOSIS — N2581 Secondary hyperparathyroidism of renal origin: Secondary | ICD-10-CM | POA: Diagnosis not present

## 2019-05-24 DIAGNOSIS — Z992 Dependence on renal dialysis: Secondary | ICD-10-CM | POA: Diagnosis not present

## 2019-05-24 DIAGNOSIS — D509 Iron deficiency anemia, unspecified: Secondary | ICD-10-CM | POA: Diagnosis not present

## 2019-05-25 DIAGNOSIS — I509 Heart failure, unspecified: Secondary | ICD-10-CM | POA: Diagnosis not present

## 2019-05-25 DIAGNOSIS — M86172 Other acute osteomyelitis, left ankle and foot: Secondary | ICD-10-CM | POA: Diagnosis not present

## 2019-05-25 DIAGNOSIS — Z4781 Encounter for orthopedic aftercare following surgical amputation: Secondary | ICD-10-CM | POA: Diagnosis not present

## 2019-05-25 DIAGNOSIS — L03116 Cellulitis of left lower limb: Secondary | ICD-10-CM | POA: Diagnosis not present

## 2019-05-25 DIAGNOSIS — I132 Hypertensive heart and chronic kidney disease with heart failure and with stage 5 chronic kidney disease, or end stage renal disease: Secondary | ICD-10-CM | POA: Diagnosis not present

## 2019-05-25 DIAGNOSIS — E1169 Type 2 diabetes mellitus with other specified complication: Secondary | ICD-10-CM | POA: Diagnosis not present

## 2019-05-26 DIAGNOSIS — E1122 Type 2 diabetes mellitus with diabetic chronic kidney disease: Secondary | ICD-10-CM | POA: Diagnosis not present

## 2019-05-26 DIAGNOSIS — Z992 Dependence on renal dialysis: Secondary | ICD-10-CM | POA: Diagnosis not present

## 2019-05-26 DIAGNOSIS — D509 Iron deficiency anemia, unspecified: Secondary | ICD-10-CM | POA: Diagnosis not present

## 2019-05-26 DIAGNOSIS — D631 Anemia in chronic kidney disease: Secondary | ICD-10-CM | POA: Diagnosis not present

## 2019-05-26 DIAGNOSIS — N2581 Secondary hyperparathyroidism of renal origin: Secondary | ICD-10-CM | POA: Diagnosis not present

## 2019-05-26 DIAGNOSIS — N186 End stage renal disease: Secondary | ICD-10-CM | POA: Diagnosis not present

## 2019-05-28 DIAGNOSIS — D509 Iron deficiency anemia, unspecified: Secondary | ICD-10-CM | POA: Diagnosis not present

## 2019-05-28 DIAGNOSIS — E1122 Type 2 diabetes mellitus with diabetic chronic kidney disease: Secondary | ICD-10-CM | POA: Diagnosis not present

## 2019-05-28 DIAGNOSIS — N2581 Secondary hyperparathyroidism of renal origin: Secondary | ICD-10-CM | POA: Diagnosis not present

## 2019-05-28 DIAGNOSIS — D631 Anemia in chronic kidney disease: Secondary | ICD-10-CM | POA: Diagnosis not present

## 2019-05-28 DIAGNOSIS — Z992 Dependence on renal dialysis: Secondary | ICD-10-CM | POA: Diagnosis not present

## 2019-05-28 DIAGNOSIS — N186 End stage renal disease: Secondary | ICD-10-CM | POA: Diagnosis not present

## 2019-05-29 DIAGNOSIS — Z792 Long term (current) use of antibiotics: Secondary | ICD-10-CM | POA: Diagnosis not present

## 2019-05-29 DIAGNOSIS — Z89422 Acquired absence of other left toe(s): Secondary | ICD-10-CM | POA: Diagnosis not present

## 2019-05-29 DIAGNOSIS — E1142 Type 2 diabetes mellitus with diabetic polyneuropathy: Secondary | ICD-10-CM | POA: Diagnosis not present

## 2019-05-29 DIAGNOSIS — Z452 Encounter for adjustment and management of vascular access device: Secondary | ICD-10-CM | POA: Diagnosis not present

## 2019-05-29 DIAGNOSIS — E1136 Type 2 diabetes mellitus with diabetic cataract: Secondary | ICD-10-CM | POA: Diagnosis not present

## 2019-05-29 DIAGNOSIS — I132 Hypertensive heart and chronic kidney disease with heart failure and with stage 5 chronic kidney disease, or end stage renal disease: Secondary | ICD-10-CM | POA: Diagnosis not present

## 2019-05-29 DIAGNOSIS — I509 Heart failure, unspecified: Secondary | ICD-10-CM | POA: Diagnosis not present

## 2019-05-29 DIAGNOSIS — K219 Gastro-esophageal reflux disease without esophagitis: Secondary | ICD-10-CM | POA: Diagnosis not present

## 2019-05-29 DIAGNOSIS — L03116 Cellulitis of left lower limb: Secondary | ICD-10-CM | POA: Diagnosis not present

## 2019-05-29 DIAGNOSIS — Z794 Long term (current) use of insulin: Secondary | ICD-10-CM | POA: Diagnosis not present

## 2019-05-29 DIAGNOSIS — I70209 Unspecified atherosclerosis of native arteries of extremities, unspecified extremity: Secondary | ICD-10-CM | POA: Diagnosis not present

## 2019-05-29 DIAGNOSIS — Z4781 Encounter for orthopedic aftercare following surgical amputation: Secondary | ICD-10-CM | POA: Diagnosis not present

## 2019-05-29 DIAGNOSIS — Z7982 Long term (current) use of aspirin: Secondary | ICD-10-CM | POA: Diagnosis not present

## 2019-05-29 DIAGNOSIS — E1151 Type 2 diabetes mellitus with diabetic peripheral angiopathy without gangrene: Secondary | ICD-10-CM | POA: Diagnosis not present

## 2019-05-29 DIAGNOSIS — Z992 Dependence on renal dialysis: Secondary | ICD-10-CM | POA: Diagnosis not present

## 2019-05-29 DIAGNOSIS — M86172 Other acute osteomyelitis, left ankle and foot: Secondary | ICD-10-CM | POA: Diagnosis not present

## 2019-05-29 DIAGNOSIS — Z9181 History of falling: Secondary | ICD-10-CM | POA: Diagnosis not present

## 2019-05-29 DIAGNOSIS — E1169 Type 2 diabetes mellitus with other specified complication: Secondary | ICD-10-CM | POA: Diagnosis not present

## 2019-05-29 DIAGNOSIS — D631 Anemia in chronic kidney disease: Secondary | ICD-10-CM | POA: Diagnosis not present

## 2019-05-29 DIAGNOSIS — E1122 Type 2 diabetes mellitus with diabetic chronic kidney disease: Secondary | ICD-10-CM | POA: Diagnosis not present

## 2019-05-29 DIAGNOSIS — N186 End stage renal disease: Secondary | ICD-10-CM | POA: Diagnosis not present

## 2019-05-29 DIAGNOSIS — Z8614 Personal history of Methicillin resistant Staphylococcus aureus infection: Secondary | ICD-10-CM | POA: Diagnosis not present

## 2019-05-29 DIAGNOSIS — G40909 Epilepsy, unspecified, not intractable, without status epilepticus: Secondary | ICD-10-CM | POA: Diagnosis not present

## 2019-05-29 DIAGNOSIS — I272 Pulmonary hypertension, unspecified: Secondary | ICD-10-CM | POA: Diagnosis not present

## 2019-05-29 DIAGNOSIS — J984 Other disorders of lung: Secondary | ICD-10-CM | POA: Diagnosis not present

## 2019-05-30 DIAGNOSIS — N2581 Secondary hyperparathyroidism of renal origin: Secondary | ICD-10-CM | POA: Diagnosis not present

## 2019-05-30 DIAGNOSIS — M86172 Other acute osteomyelitis, left ankle and foot: Secondary | ICD-10-CM | POA: Diagnosis not present

## 2019-05-30 DIAGNOSIS — D509 Iron deficiency anemia, unspecified: Secondary | ICD-10-CM | POA: Diagnosis not present

## 2019-05-30 DIAGNOSIS — I132 Hypertensive heart and chronic kidney disease with heart failure and with stage 5 chronic kidney disease, or end stage renal disease: Secondary | ICD-10-CM | POA: Diagnosis not present

## 2019-05-30 DIAGNOSIS — L03116 Cellulitis of left lower limb: Secondary | ICD-10-CM | POA: Diagnosis not present

## 2019-05-30 DIAGNOSIS — Z992 Dependence on renal dialysis: Secondary | ICD-10-CM | POA: Diagnosis not present

## 2019-05-30 DIAGNOSIS — D631 Anemia in chronic kidney disease: Secondary | ICD-10-CM | POA: Diagnosis not present

## 2019-05-30 DIAGNOSIS — N186 End stage renal disease: Secondary | ICD-10-CM | POA: Diagnosis not present

## 2019-05-30 DIAGNOSIS — Z4781 Encounter for orthopedic aftercare following surgical amputation: Secondary | ICD-10-CM | POA: Diagnosis not present

## 2019-05-30 DIAGNOSIS — E1169 Type 2 diabetes mellitus with other specified complication: Secondary | ICD-10-CM | POA: Diagnosis not present

## 2019-05-30 DIAGNOSIS — I509 Heart failure, unspecified: Secondary | ICD-10-CM | POA: Diagnosis not present

## 2019-05-30 DIAGNOSIS — E1122 Type 2 diabetes mellitus with diabetic chronic kidney disease: Secondary | ICD-10-CM | POA: Diagnosis not present

## 2019-06-02 DIAGNOSIS — N186 End stage renal disease: Secondary | ICD-10-CM | POA: Diagnosis not present

## 2019-06-02 DIAGNOSIS — D509 Iron deficiency anemia, unspecified: Secondary | ICD-10-CM | POA: Diagnosis not present

## 2019-06-02 DIAGNOSIS — D631 Anemia in chronic kidney disease: Secondary | ICD-10-CM | POA: Diagnosis not present

## 2019-06-02 DIAGNOSIS — N2581 Secondary hyperparathyroidism of renal origin: Secondary | ICD-10-CM | POA: Diagnosis not present

## 2019-06-02 DIAGNOSIS — E1122 Type 2 diabetes mellitus with diabetic chronic kidney disease: Secondary | ICD-10-CM | POA: Diagnosis not present

## 2019-06-02 DIAGNOSIS — Z992 Dependence on renal dialysis: Secondary | ICD-10-CM | POA: Diagnosis not present

## 2019-06-05 DIAGNOSIS — Z992 Dependence on renal dialysis: Secondary | ICD-10-CM | POA: Diagnosis not present

## 2019-06-05 DIAGNOSIS — E1122 Type 2 diabetes mellitus with diabetic chronic kidney disease: Secondary | ICD-10-CM | POA: Diagnosis not present

## 2019-06-05 DIAGNOSIS — D509 Iron deficiency anemia, unspecified: Secondary | ICD-10-CM | POA: Diagnosis not present

## 2019-06-05 DIAGNOSIS — N186 End stage renal disease: Secondary | ICD-10-CM | POA: Diagnosis not present

## 2019-06-05 DIAGNOSIS — D631 Anemia in chronic kidney disease: Secondary | ICD-10-CM | POA: Diagnosis not present

## 2019-06-05 DIAGNOSIS — N2581 Secondary hyperparathyroidism of renal origin: Secondary | ICD-10-CM | POA: Diagnosis not present

## 2019-06-06 DIAGNOSIS — I509 Heart failure, unspecified: Secondary | ICD-10-CM | POA: Diagnosis not present

## 2019-06-06 DIAGNOSIS — M86172 Other acute osteomyelitis, left ankle and foot: Secondary | ICD-10-CM | POA: Diagnosis not present

## 2019-06-06 DIAGNOSIS — Z4781 Encounter for orthopedic aftercare following surgical amputation: Secondary | ICD-10-CM | POA: Diagnosis not present

## 2019-06-06 DIAGNOSIS — E1169 Type 2 diabetes mellitus with other specified complication: Secondary | ICD-10-CM | POA: Diagnosis not present

## 2019-06-06 DIAGNOSIS — L03116 Cellulitis of left lower limb: Secondary | ICD-10-CM | POA: Diagnosis not present

## 2019-06-06 DIAGNOSIS — I132 Hypertensive heart and chronic kidney disease with heart failure and with stage 5 chronic kidney disease, or end stage renal disease: Secondary | ICD-10-CM | POA: Diagnosis not present

## 2019-06-07 DIAGNOSIS — N2581 Secondary hyperparathyroidism of renal origin: Secondary | ICD-10-CM | POA: Diagnosis not present

## 2019-06-07 DIAGNOSIS — N186 End stage renal disease: Secondary | ICD-10-CM | POA: Diagnosis not present

## 2019-06-07 DIAGNOSIS — D631 Anemia in chronic kidney disease: Secondary | ICD-10-CM | POA: Diagnosis not present

## 2019-06-07 DIAGNOSIS — D509 Iron deficiency anemia, unspecified: Secondary | ICD-10-CM | POA: Diagnosis not present

## 2019-06-07 DIAGNOSIS — Z992 Dependence on renal dialysis: Secondary | ICD-10-CM | POA: Diagnosis not present

## 2019-06-07 DIAGNOSIS — E1122 Type 2 diabetes mellitus with diabetic chronic kidney disease: Secondary | ICD-10-CM | POA: Diagnosis not present

## 2019-06-08 DIAGNOSIS — Z794 Long term (current) use of insulin: Secondary | ICD-10-CM | POA: Diagnosis not present

## 2019-06-08 DIAGNOSIS — I509 Heart failure, unspecified: Secondary | ICD-10-CM | POA: Diagnosis not present

## 2019-06-08 DIAGNOSIS — L97522 Non-pressure chronic ulcer of other part of left foot with fat layer exposed: Secondary | ICD-10-CM | POA: Diagnosis not present

## 2019-06-08 DIAGNOSIS — R569 Unspecified convulsions: Secondary | ICD-10-CM | POA: Diagnosis not present

## 2019-06-08 DIAGNOSIS — E1140 Type 2 diabetes mellitus with diabetic neuropathy, unspecified: Secondary | ICD-10-CM | POA: Diagnosis not present

## 2019-06-08 DIAGNOSIS — E1165 Type 2 diabetes mellitus with hyperglycemia: Secondary | ICD-10-CM | POA: Diagnosis not present

## 2019-06-08 DIAGNOSIS — Z89422 Acquired absence of other left toe(s): Secondary | ICD-10-CM | POA: Diagnosis not present

## 2019-06-08 DIAGNOSIS — E11621 Type 2 diabetes mellitus with foot ulcer: Secondary | ICD-10-CM | POA: Diagnosis not present

## 2019-06-08 DIAGNOSIS — Z992 Dependence on renal dialysis: Secondary | ICD-10-CM | POA: Diagnosis not present

## 2019-06-08 DIAGNOSIS — M86672 Other chronic osteomyelitis, left ankle and foot: Secondary | ICD-10-CM | POA: Diagnosis not present

## 2019-06-08 DIAGNOSIS — E1122 Type 2 diabetes mellitus with diabetic chronic kidney disease: Secondary | ICD-10-CM | POA: Diagnosis not present

## 2019-06-08 DIAGNOSIS — N186 End stage renal disease: Secondary | ICD-10-CM | POA: Diagnosis not present

## 2019-06-08 DIAGNOSIS — I739 Peripheral vascular disease, unspecified: Secondary | ICD-10-CM | POA: Diagnosis not present

## 2019-06-08 DIAGNOSIS — T8781 Dehiscence of amputation stump: Secondary | ICD-10-CM | POA: Diagnosis not present

## 2019-06-08 DIAGNOSIS — L97425 Non-pressure chronic ulcer of left heel and midfoot with muscle involvement without evidence of necrosis: Secondary | ICD-10-CM | POA: Diagnosis not present

## 2019-06-08 DIAGNOSIS — J984 Other disorders of lung: Secondary | ICD-10-CM | POA: Diagnosis not present

## 2019-06-09 DIAGNOSIS — D631 Anemia in chronic kidney disease: Secondary | ICD-10-CM | POA: Diagnosis not present

## 2019-06-09 DIAGNOSIS — E1122 Type 2 diabetes mellitus with diabetic chronic kidney disease: Secondary | ICD-10-CM | POA: Diagnosis not present

## 2019-06-09 DIAGNOSIS — N186 End stage renal disease: Secondary | ICD-10-CM | POA: Diagnosis not present

## 2019-06-09 DIAGNOSIS — N2581 Secondary hyperparathyroidism of renal origin: Secondary | ICD-10-CM | POA: Diagnosis not present

## 2019-06-09 DIAGNOSIS — Z992 Dependence on renal dialysis: Secondary | ICD-10-CM | POA: Diagnosis not present

## 2019-06-09 DIAGNOSIS — D509 Iron deficiency anemia, unspecified: Secondary | ICD-10-CM | POA: Diagnosis not present

## 2019-06-11 DIAGNOSIS — E1165 Type 2 diabetes mellitus with hyperglycemia: Secondary | ICD-10-CM | POA: Diagnosis not present

## 2019-06-11 DIAGNOSIS — E11621 Type 2 diabetes mellitus with foot ulcer: Secondary | ICD-10-CM | POA: Diagnosis not present

## 2019-06-11 DIAGNOSIS — Z992 Dependence on renal dialysis: Secondary | ICD-10-CM | POA: Diagnosis not present

## 2019-06-11 DIAGNOSIS — L97425 Non-pressure chronic ulcer of left heel and midfoot with muscle involvement without evidence of necrosis: Secondary | ICD-10-CM | POA: Diagnosis not present

## 2019-06-11 DIAGNOSIS — L97522 Non-pressure chronic ulcer of other part of left foot with fat layer exposed: Secondary | ICD-10-CM | POA: Diagnosis not present

## 2019-06-11 DIAGNOSIS — I739 Peripheral vascular disease, unspecified: Secondary | ICD-10-CM | POA: Diagnosis not present

## 2019-06-11 DIAGNOSIS — M86672 Other chronic osteomyelitis, left ankle and foot: Secondary | ICD-10-CM | POA: Diagnosis not present

## 2019-06-11 DIAGNOSIS — E1140 Type 2 diabetes mellitus with diabetic neuropathy, unspecified: Secondary | ICD-10-CM | POA: Diagnosis not present

## 2019-06-11 DIAGNOSIS — E1122 Type 2 diabetes mellitus with diabetic chronic kidney disease: Secondary | ICD-10-CM | POA: Diagnosis not present

## 2019-06-12 ENCOUNTER — Telehealth: Payer: Self-pay

## 2019-06-12 ENCOUNTER — Encounter: Payer: Self-pay | Admitting: Family Medicine

## 2019-06-12 DIAGNOSIS — N2581 Secondary hyperparathyroidism of renal origin: Secondary | ICD-10-CM | POA: Diagnosis not present

## 2019-06-12 DIAGNOSIS — E1165 Type 2 diabetes mellitus with hyperglycemia: Secondary | ICD-10-CM | POA: Diagnosis not present

## 2019-06-12 DIAGNOSIS — E1140 Type 2 diabetes mellitus with diabetic neuropathy, unspecified: Secondary | ICD-10-CM | POA: Diagnosis not present

## 2019-06-12 DIAGNOSIS — Z992 Dependence on renal dialysis: Secondary | ICD-10-CM | POA: Diagnosis not present

## 2019-06-12 DIAGNOSIS — IMO0002 Reserved for concepts with insufficient information to code with codable children: Secondary | ICD-10-CM

## 2019-06-12 DIAGNOSIS — D509 Iron deficiency anemia, unspecified: Secondary | ICD-10-CM | POA: Diagnosis not present

## 2019-06-12 DIAGNOSIS — D631 Anemia in chronic kidney disease: Secondary | ICD-10-CM | POA: Diagnosis not present

## 2019-06-12 DIAGNOSIS — E0822 Diabetes mellitus due to underlying condition with diabetic chronic kidney disease: Secondary | ICD-10-CM

## 2019-06-12 DIAGNOSIS — E1122 Type 2 diabetes mellitus with diabetic chronic kidney disease: Secondary | ICD-10-CM | POA: Diagnosis not present

## 2019-06-12 DIAGNOSIS — M86672 Other chronic osteomyelitis, left ankle and foot: Secondary | ICD-10-CM | POA: Diagnosis not present

## 2019-06-12 DIAGNOSIS — N186 End stage renal disease: Secondary | ICD-10-CM | POA: Diagnosis not present

## 2019-06-12 DIAGNOSIS — L97522 Non-pressure chronic ulcer of other part of left foot with fat layer exposed: Secondary | ICD-10-CM | POA: Diagnosis not present

## 2019-06-12 DIAGNOSIS — E11621 Type 2 diabetes mellitus with foot ulcer: Secondary | ICD-10-CM | POA: Diagnosis not present

## 2019-06-12 NOTE — Telephone Encounter (Signed)
Copied from [place] [id number]. Topic: General - Other >> Jun 11, 2019  3:50 PM Leward Quan A wrote: Reason for CRM: Patient wife called and  is asking for a referral from Dr Lorelei Pont for his Endocrinologist at [facility] Fax# [phone]. Please call patient at Ph#  [phone]

## 2019-06-12 NOTE — Addendum Note (Signed)
Addended by: Lamar Blinks C on: 06/12/2019 08:01 AM   Modules accepted: Orders

## 2019-06-13 ENCOUNTER — Telehealth: Payer: Self-pay

## 2019-06-13 DIAGNOSIS — E11621 Type 2 diabetes mellitus with foot ulcer: Secondary | ICD-10-CM | POA: Diagnosis not present

## 2019-06-13 DIAGNOSIS — E1122 Type 2 diabetes mellitus with diabetic chronic kidney disease: Secondary | ICD-10-CM | POA: Diagnosis not present

## 2019-06-13 DIAGNOSIS — M86172 Other acute osteomyelitis, left ankle and foot: Secondary | ICD-10-CM | POA: Diagnosis not present

## 2019-06-13 DIAGNOSIS — Z794 Long term (current) use of insulin: Secondary | ICD-10-CM | POA: Diagnosis not present

## 2019-06-13 DIAGNOSIS — E1140 Type 2 diabetes mellitus with diabetic neuropathy, unspecified: Secondary | ICD-10-CM | POA: Diagnosis not present

## 2019-06-13 DIAGNOSIS — E1165 Type 2 diabetes mellitus with hyperglycemia: Secondary | ICD-10-CM | POA: Diagnosis not present

## 2019-06-13 DIAGNOSIS — E1169 Type 2 diabetes mellitus with other specified complication: Secondary | ICD-10-CM | POA: Diagnosis not present

## 2019-06-13 DIAGNOSIS — E0852 Diabetes mellitus due to underlying condition with diabetic peripheral angiopathy with gangrene: Secondary | ICD-10-CM | POA: Diagnosis not present

## 2019-06-13 DIAGNOSIS — I509 Heart failure, unspecified: Secondary | ICD-10-CM | POA: Diagnosis not present

## 2019-06-13 DIAGNOSIS — L97522 Non-pressure chronic ulcer of other part of left foot with fat layer exposed: Secondary | ICD-10-CM | POA: Diagnosis not present

## 2019-06-13 DIAGNOSIS — L03116 Cellulitis of left lower limb: Secondary | ICD-10-CM | POA: Diagnosis not present

## 2019-06-13 DIAGNOSIS — M86672 Other chronic osteomyelitis, left ankle and foot: Secondary | ICD-10-CM | POA: Diagnosis not present

## 2019-06-13 DIAGNOSIS — Z4781 Encounter for orthopedic aftercare following surgical amputation: Secondary | ICD-10-CM | POA: Diagnosis not present

## 2019-06-13 DIAGNOSIS — I132 Hypertensive heart and chronic kidney disease with heart failure and with stage 5 chronic kidney disease, or end stage renal disease: Secondary | ICD-10-CM | POA: Diagnosis not present

## 2019-06-13 NOTE — Telephone Encounter (Signed)
Copied from [place] [id number]. Topic: General - Other >> Jun 11, 2019  3:50 PM Leward Quan A wrote: Reason for CRM: Patient wife called and  is asking for a referral from Dr Lorelei Pont for his Endocrinologist at [facility] Fax# [phone]. Please call patient at Ph#  [phone] >> Jun 13, 2019 10:02 AM Keene Breath wrote: Patient's wife called and said that the referral to the Endocrinologist has not been received.  Please advise and send again if possible.  CB# [phone]

## 2019-06-13 NOTE — Telephone Encounter (Signed)
[facility] did referral yesterday,  Can we please call [facility] and check on it?  Thank you!

## 2019-06-13 NOTE — Telephone Encounter (Signed)
Copied from [place] [phone]. Topic: General - Other >> Jun 11, 2019  3:50 PM Leward Quan A wrote: Reason for CRM: Patient wife called and  is asking for a referral from Dr Lorelei Pont for his Endocrinologist at [facility] Fax# [phone]. Please call patient at Ph#  [phone] >> Jun 13, 2019 10:02 AM Keene Breath wrote: Patient's wife called and said that the referral to the Endocrinologist has not been received.  Please advise and send again if possible.  CB# [phone]

## 2019-06-13 NOTE — Telephone Encounter (Signed)
Yes patient is needing new referral to the practice he is established at

## 2019-06-14 DIAGNOSIS — E1122 Type 2 diabetes mellitus with diabetic chronic kidney disease: Secondary | ICD-10-CM | POA: Diagnosis not present

## 2019-06-14 DIAGNOSIS — L97522 Non-pressure chronic ulcer of other part of left foot with fat layer exposed: Secondary | ICD-10-CM | POA: Diagnosis not present

## 2019-06-14 DIAGNOSIS — E1140 Type 2 diabetes mellitus with diabetic neuropathy, unspecified: Secondary | ICD-10-CM | POA: Diagnosis not present

## 2019-06-14 DIAGNOSIS — D631 Anemia in chronic kidney disease: Secondary | ICD-10-CM | POA: Diagnosis not present

## 2019-06-14 DIAGNOSIS — E1165 Type 2 diabetes mellitus with hyperglycemia: Secondary | ICD-10-CM | POA: Diagnosis not present

## 2019-06-14 DIAGNOSIS — Z992 Dependence on renal dialysis: Secondary | ICD-10-CM | POA: Diagnosis not present

## 2019-06-14 DIAGNOSIS — D509 Iron deficiency anemia, unspecified: Secondary | ICD-10-CM | POA: Diagnosis not present

## 2019-06-14 DIAGNOSIS — M86672 Other chronic osteomyelitis, left ankle and foot: Secondary | ICD-10-CM | POA: Diagnosis not present

## 2019-06-14 DIAGNOSIS — E11621 Type 2 diabetes mellitus with foot ulcer: Secondary | ICD-10-CM | POA: Diagnosis not present

## 2019-06-14 DIAGNOSIS — N186 End stage renal disease: Secondary | ICD-10-CM | POA: Diagnosis not present

## 2019-06-14 DIAGNOSIS — N2581 Secondary hyperparathyroidism of renal origin: Secondary | ICD-10-CM | POA: Diagnosis not present

## 2019-06-14 NOTE — Telephone Encounter (Signed)
Can one of you check on this referral to see if it was sent from [place]?  Referral was placed on 06/12/19.

## 2019-06-15 DIAGNOSIS — I779 Disorder of arteries and arterioles, unspecified: Secondary | ICD-10-CM | POA: Diagnosis not present

## 2019-06-15 DIAGNOSIS — L97526 Non-pressure chronic ulcer of other part of left foot with bone involvement without evidence of necrosis: Secondary | ICD-10-CM | POA: Diagnosis not present

## 2019-06-15 DIAGNOSIS — M869 Osteomyelitis, unspecified: Secondary | ICD-10-CM | POA: Diagnosis not present

## 2019-06-15 DIAGNOSIS — E1122 Type 2 diabetes mellitus with diabetic chronic kidney disease: Secondary | ICD-10-CM | POA: Diagnosis not present

## 2019-06-15 DIAGNOSIS — L97522 Non-pressure chronic ulcer of other part of left foot with fat layer exposed: Secondary | ICD-10-CM | POA: Diagnosis not present

## 2019-06-15 DIAGNOSIS — L97429 Non-pressure chronic ulcer of left heel and midfoot with unspecified severity: Secondary | ICD-10-CM | POA: Diagnosis not present

## 2019-06-15 DIAGNOSIS — E1165 Type 2 diabetes mellitus with hyperglycemia: Secondary | ICD-10-CM | POA: Diagnosis not present

## 2019-06-15 DIAGNOSIS — M86672 Other chronic osteomyelitis, left ankle and foot: Secondary | ICD-10-CM | POA: Diagnosis not present

## 2019-06-15 DIAGNOSIS — E1140 Type 2 diabetes mellitus with diabetic neuropathy, unspecified: Secondary | ICD-10-CM | POA: Diagnosis not present

## 2019-06-15 DIAGNOSIS — L97529 Non-pressure chronic ulcer of other part of left foot with unspecified severity: Secondary | ICD-10-CM | POA: Diagnosis not present

## 2019-06-15 DIAGNOSIS — Z7982 Long term (current) use of aspirin: Secondary | ICD-10-CM | POA: Diagnosis not present

## 2019-06-15 DIAGNOSIS — E11621 Type 2 diabetes mellitus with foot ulcer: Secondary | ICD-10-CM | POA: Diagnosis not present

## 2019-06-15 DIAGNOSIS — E1142 Type 2 diabetes mellitus with diabetic polyneuropathy: Secondary | ICD-10-CM | POA: Diagnosis not present

## 2019-06-16 DIAGNOSIS — D509 Iron deficiency anemia, unspecified: Secondary | ICD-10-CM | POA: Diagnosis not present

## 2019-06-16 DIAGNOSIS — Z992 Dependence on renal dialysis: Secondary | ICD-10-CM | POA: Diagnosis not present

## 2019-06-16 DIAGNOSIS — N2581 Secondary hyperparathyroidism of renal origin: Secondary | ICD-10-CM | POA: Diagnosis not present

## 2019-06-16 DIAGNOSIS — N186 End stage renal disease: Secondary | ICD-10-CM | POA: Diagnosis not present

## 2019-06-16 DIAGNOSIS — D631 Anemia in chronic kidney disease: Secondary | ICD-10-CM | POA: Diagnosis not present

## 2019-06-16 DIAGNOSIS — E1122 Type 2 diabetes mellitus with diabetic chronic kidney disease: Secondary | ICD-10-CM | POA: Diagnosis not present

## 2019-06-18 DIAGNOSIS — M86672 Other chronic osteomyelitis, left ankle and foot: Secondary | ICD-10-CM | POA: Diagnosis not present

## 2019-06-18 DIAGNOSIS — E11621 Type 2 diabetes mellitus with foot ulcer: Secondary | ICD-10-CM | POA: Diagnosis not present

## 2019-06-18 DIAGNOSIS — L97522 Non-pressure chronic ulcer of other part of left foot with fat layer exposed: Secondary | ICD-10-CM | POA: Diagnosis not present

## 2019-06-18 DIAGNOSIS — E1140 Type 2 diabetes mellitus with diabetic neuropathy, unspecified: Secondary | ICD-10-CM | POA: Diagnosis not present

## 2019-06-18 DIAGNOSIS — E1122 Type 2 diabetes mellitus with diabetic chronic kidney disease: Secondary | ICD-10-CM | POA: Diagnosis not present

## 2019-06-18 DIAGNOSIS — E1165 Type 2 diabetes mellitus with hyperglycemia: Secondary | ICD-10-CM | POA: Diagnosis not present

## 2019-06-18 DIAGNOSIS — L97425 Non-pressure chronic ulcer of left heel and midfoot with muscle involvement without evidence of necrosis: Secondary | ICD-10-CM | POA: Diagnosis not present

## 2019-06-19 DIAGNOSIS — D509 Iron deficiency anemia, unspecified: Secondary | ICD-10-CM | POA: Diagnosis not present

## 2019-06-19 DIAGNOSIS — E1165 Type 2 diabetes mellitus with hyperglycemia: Secondary | ICD-10-CM | POA: Diagnosis not present

## 2019-06-19 DIAGNOSIS — Z992 Dependence on renal dialysis: Secondary | ICD-10-CM | POA: Diagnosis not present

## 2019-06-19 DIAGNOSIS — E1122 Type 2 diabetes mellitus with diabetic chronic kidney disease: Secondary | ICD-10-CM | POA: Diagnosis not present

## 2019-06-19 DIAGNOSIS — E11621 Type 2 diabetes mellitus with foot ulcer: Secondary | ICD-10-CM | POA: Diagnosis not present

## 2019-06-19 DIAGNOSIS — N186 End stage renal disease: Secondary | ICD-10-CM | POA: Diagnosis not present

## 2019-06-19 DIAGNOSIS — N2581 Secondary hyperparathyroidism of renal origin: Secondary | ICD-10-CM | POA: Diagnosis not present

## 2019-06-19 DIAGNOSIS — L97522 Non-pressure chronic ulcer of other part of left foot with fat layer exposed: Secondary | ICD-10-CM | POA: Diagnosis not present

## 2019-06-19 DIAGNOSIS — E1140 Type 2 diabetes mellitus with diabetic neuropathy, unspecified: Secondary | ICD-10-CM | POA: Diagnosis not present

## 2019-06-19 DIAGNOSIS — M86672 Other chronic osteomyelitis, left ankle and foot: Secondary | ICD-10-CM | POA: Diagnosis not present

## 2019-06-19 DIAGNOSIS — D631 Anemia in chronic kidney disease: Secondary | ICD-10-CM | POA: Diagnosis not present

## 2019-06-21 DIAGNOSIS — N2581 Secondary hyperparathyroidism of renal origin: Secondary | ICD-10-CM | POA: Diagnosis not present

## 2019-06-21 DIAGNOSIS — D509 Iron deficiency anemia, unspecified: Secondary | ICD-10-CM | POA: Diagnosis not present

## 2019-06-21 DIAGNOSIS — Z992 Dependence on renal dialysis: Secondary | ICD-10-CM | POA: Diagnosis not present

## 2019-06-21 DIAGNOSIS — D631 Anemia in chronic kidney disease: Secondary | ICD-10-CM | POA: Diagnosis not present

## 2019-06-21 DIAGNOSIS — E1122 Type 2 diabetes mellitus with diabetic chronic kidney disease: Secondary | ICD-10-CM | POA: Diagnosis not present

## 2019-06-21 DIAGNOSIS — N186 End stage renal disease: Secondary | ICD-10-CM | POA: Diagnosis not present

## 2019-06-22 DIAGNOSIS — I509 Heart failure, unspecified: Secondary | ICD-10-CM | POA: Diagnosis not present

## 2019-06-22 DIAGNOSIS — L97522 Non-pressure chronic ulcer of other part of left foot with fat layer exposed: Secondary | ICD-10-CM | POA: Diagnosis not present

## 2019-06-22 DIAGNOSIS — E1140 Type 2 diabetes mellitus with diabetic neuropathy, unspecified: Secondary | ICD-10-CM | POA: Diagnosis not present

## 2019-06-22 DIAGNOSIS — M86172 Other acute osteomyelitis, left ankle and foot: Secondary | ICD-10-CM | POA: Diagnosis not present

## 2019-06-22 DIAGNOSIS — E1122 Type 2 diabetes mellitus with diabetic chronic kidney disease: Secondary | ICD-10-CM | POA: Diagnosis not present

## 2019-06-22 DIAGNOSIS — E11621 Type 2 diabetes mellitus with foot ulcer: Secondary | ICD-10-CM | POA: Diagnosis not present

## 2019-06-22 DIAGNOSIS — I132 Hypertensive heart and chronic kidney disease with heart failure and with stage 5 chronic kidney disease, or end stage renal disease: Secondary | ICD-10-CM | POA: Diagnosis not present

## 2019-06-22 DIAGNOSIS — Z4781 Encounter for orthopedic aftercare following surgical amputation: Secondary | ICD-10-CM | POA: Diagnosis not present

## 2019-06-22 DIAGNOSIS — M86672 Other chronic osteomyelitis, left ankle and foot: Secondary | ICD-10-CM | POA: Diagnosis not present

## 2019-06-22 DIAGNOSIS — L97425 Non-pressure chronic ulcer of left heel and midfoot with muscle involvement without evidence of necrosis: Secondary | ICD-10-CM | POA: Diagnosis not present

## 2019-06-22 DIAGNOSIS — N186 End stage renal disease: Secondary | ICD-10-CM | POA: Diagnosis not present

## 2019-06-22 DIAGNOSIS — E1165 Type 2 diabetes mellitus with hyperglycemia: Secondary | ICD-10-CM | POA: Diagnosis not present

## 2019-06-22 DIAGNOSIS — E1169 Type 2 diabetes mellitus with other specified complication: Secondary | ICD-10-CM | POA: Diagnosis not present

## 2019-06-22 DIAGNOSIS — L03116 Cellulitis of left lower limb: Secondary | ICD-10-CM | POA: Diagnosis not present

## 2019-06-23 DIAGNOSIS — Z992 Dependence on renal dialysis: Secondary | ICD-10-CM | POA: Diagnosis not present

## 2019-06-23 DIAGNOSIS — E1122 Type 2 diabetes mellitus with diabetic chronic kidney disease: Secondary | ICD-10-CM | POA: Diagnosis not present

## 2019-06-23 DIAGNOSIS — D509 Iron deficiency anemia, unspecified: Secondary | ICD-10-CM | POA: Diagnosis not present

## 2019-06-23 DIAGNOSIS — N2581 Secondary hyperparathyroidism of renal origin: Secondary | ICD-10-CM | POA: Diagnosis not present

## 2019-06-23 DIAGNOSIS — D631 Anemia in chronic kidney disease: Secondary | ICD-10-CM | POA: Diagnosis not present

## 2019-06-23 DIAGNOSIS — N186 End stage renal disease: Secondary | ICD-10-CM | POA: Diagnosis not present

## 2019-06-25 DIAGNOSIS — E1122 Type 2 diabetes mellitus with diabetic chronic kidney disease: Secondary | ICD-10-CM | POA: Diagnosis not present

## 2019-06-25 DIAGNOSIS — M86672 Other chronic osteomyelitis, left ankle and foot: Secondary | ICD-10-CM | POA: Diagnosis not present

## 2019-06-25 DIAGNOSIS — L97522 Non-pressure chronic ulcer of other part of left foot with fat layer exposed: Secondary | ICD-10-CM | POA: Diagnosis not present

## 2019-06-25 DIAGNOSIS — L97425 Non-pressure chronic ulcer of left heel and midfoot with muscle involvement without evidence of necrosis: Secondary | ICD-10-CM | POA: Diagnosis not present

## 2019-06-25 DIAGNOSIS — E11621 Type 2 diabetes mellitus with foot ulcer: Secondary | ICD-10-CM | POA: Diagnosis not present

## 2019-06-25 DIAGNOSIS — E1165 Type 2 diabetes mellitus with hyperglycemia: Secondary | ICD-10-CM | POA: Diagnosis not present

## 2019-06-25 DIAGNOSIS — E1140 Type 2 diabetes mellitus with diabetic neuropathy, unspecified: Secondary | ICD-10-CM | POA: Diagnosis not present

## 2019-06-26 DIAGNOSIS — N2581 Secondary hyperparathyroidism of renal origin: Secondary | ICD-10-CM | POA: Diagnosis not present

## 2019-06-26 DIAGNOSIS — Z992 Dependence on renal dialysis: Secondary | ICD-10-CM | POA: Diagnosis not present

## 2019-06-26 DIAGNOSIS — D509 Iron deficiency anemia, unspecified: Secondary | ICD-10-CM | POA: Diagnosis not present

## 2019-06-26 DIAGNOSIS — N186 End stage renal disease: Secondary | ICD-10-CM | POA: Diagnosis not present

## 2019-06-26 DIAGNOSIS — E1122 Type 2 diabetes mellitus with diabetic chronic kidney disease: Secondary | ICD-10-CM | POA: Diagnosis not present

## 2019-06-26 DIAGNOSIS — D631 Anemia in chronic kidney disease: Secondary | ICD-10-CM | POA: Diagnosis not present

## 2019-06-27 DIAGNOSIS — L03116 Cellulitis of left lower limb: Secondary | ICD-10-CM | POA: Diagnosis not present

## 2019-06-27 DIAGNOSIS — E11621 Type 2 diabetes mellitus with foot ulcer: Secondary | ICD-10-CM | POA: Diagnosis not present

## 2019-06-27 DIAGNOSIS — M86672 Other chronic osteomyelitis, left ankle and foot: Secondary | ICD-10-CM | POA: Diagnosis not present

## 2019-06-27 DIAGNOSIS — I132 Hypertensive heart and chronic kidney disease with heart failure and with stage 5 chronic kidney disease, or end stage renal disease: Secondary | ICD-10-CM | POA: Diagnosis not present

## 2019-06-27 DIAGNOSIS — I509 Heart failure, unspecified: Secondary | ICD-10-CM | POA: Diagnosis not present

## 2019-06-27 DIAGNOSIS — M86172 Other acute osteomyelitis, left ankle and foot: Secondary | ICD-10-CM | POA: Diagnosis not present

## 2019-06-27 DIAGNOSIS — E1169 Type 2 diabetes mellitus with other specified complication: Secondary | ICD-10-CM | POA: Diagnosis not present

## 2019-06-27 DIAGNOSIS — E1122 Type 2 diabetes mellitus with diabetic chronic kidney disease: Secondary | ICD-10-CM | POA: Diagnosis not present

## 2019-06-27 DIAGNOSIS — E1165 Type 2 diabetes mellitus with hyperglycemia: Secondary | ICD-10-CM | POA: Diagnosis not present

## 2019-06-27 DIAGNOSIS — L97522 Non-pressure chronic ulcer of other part of left foot with fat layer exposed: Secondary | ICD-10-CM | POA: Diagnosis not present

## 2019-06-27 DIAGNOSIS — E1140 Type 2 diabetes mellitus with diabetic neuropathy, unspecified: Secondary | ICD-10-CM | POA: Diagnosis not present

## 2019-06-27 DIAGNOSIS — Z4781 Encounter for orthopedic aftercare following surgical amputation: Secondary | ICD-10-CM | POA: Diagnosis not present

## 2019-06-27 DIAGNOSIS — L97425 Non-pressure chronic ulcer of left heel and midfoot with muscle involvement without evidence of necrosis: Secondary | ICD-10-CM | POA: Diagnosis not present

## 2019-06-28 DIAGNOSIS — N186 End stage renal disease: Secondary | ICD-10-CM | POA: Diagnosis not present

## 2019-06-28 DIAGNOSIS — I509 Heart failure, unspecified: Secondary | ICD-10-CM | POA: Diagnosis not present

## 2019-06-28 DIAGNOSIS — M86172 Other acute osteomyelitis, left ankle and foot: Secondary | ICD-10-CM | POA: Diagnosis not present

## 2019-06-28 DIAGNOSIS — J984 Other disorders of lung: Secondary | ICD-10-CM | POA: Diagnosis not present

## 2019-06-28 DIAGNOSIS — I70209 Unspecified atherosclerosis of native arteries of extremities, unspecified extremity: Secondary | ICD-10-CM | POA: Diagnosis not present

## 2019-06-28 DIAGNOSIS — I272 Pulmonary hypertension, unspecified: Secondary | ICD-10-CM | POA: Diagnosis not present

## 2019-06-28 DIAGNOSIS — L03116 Cellulitis of left lower limb: Secondary | ICD-10-CM | POA: Diagnosis not present

## 2019-06-28 DIAGNOSIS — Z7982 Long term (current) use of aspirin: Secondary | ICD-10-CM | POA: Diagnosis not present

## 2019-06-28 DIAGNOSIS — I132 Hypertensive heart and chronic kidney disease with heart failure and with stage 5 chronic kidney disease, or end stage renal disease: Secondary | ICD-10-CM | POA: Diagnosis not present

## 2019-06-28 DIAGNOSIS — D509 Iron deficiency anemia, unspecified: Secondary | ICD-10-CM | POA: Diagnosis not present

## 2019-06-28 DIAGNOSIS — E1169 Type 2 diabetes mellitus with other specified complication: Secondary | ICD-10-CM | POA: Diagnosis not present

## 2019-06-28 DIAGNOSIS — Z8614 Personal history of Methicillin resistant Staphylococcus aureus infection: Secondary | ICD-10-CM | POA: Diagnosis not present

## 2019-06-28 DIAGNOSIS — Z4781 Encounter for orthopedic aftercare following surgical amputation: Secondary | ICD-10-CM | POA: Diagnosis not present

## 2019-06-28 DIAGNOSIS — Z9181 History of falling: Secondary | ICD-10-CM | POA: Diagnosis not present

## 2019-06-28 DIAGNOSIS — E1142 Type 2 diabetes mellitus with diabetic polyneuropathy: Secondary | ICD-10-CM | POA: Diagnosis not present

## 2019-06-28 DIAGNOSIS — E1136 Type 2 diabetes mellitus with diabetic cataract: Secondary | ICD-10-CM | POA: Diagnosis not present

## 2019-06-28 DIAGNOSIS — G40909 Epilepsy, unspecified, not intractable, without status epilepticus: Secondary | ICD-10-CM | POA: Diagnosis not present

## 2019-06-28 DIAGNOSIS — N2581 Secondary hyperparathyroidism of renal origin: Secondary | ICD-10-CM | POA: Diagnosis not present

## 2019-06-28 DIAGNOSIS — E1151 Type 2 diabetes mellitus with diabetic peripheral angiopathy without gangrene: Secondary | ICD-10-CM | POA: Diagnosis not present

## 2019-06-28 DIAGNOSIS — E1122 Type 2 diabetes mellitus with diabetic chronic kidney disease: Secondary | ICD-10-CM | POA: Diagnosis not present

## 2019-06-28 DIAGNOSIS — Z89422 Acquired absence of other left toe(s): Secondary | ICD-10-CM | POA: Diagnosis not present

## 2019-06-28 DIAGNOSIS — Z794 Long term (current) use of insulin: Secondary | ICD-10-CM | POA: Diagnosis not present

## 2019-06-28 DIAGNOSIS — Z992 Dependence on renal dialysis: Secondary | ICD-10-CM | POA: Diagnosis not present

## 2019-06-28 DIAGNOSIS — K219 Gastro-esophageal reflux disease without esophagitis: Secondary | ICD-10-CM | POA: Diagnosis not present

## 2019-06-28 DIAGNOSIS — D631 Anemia in chronic kidney disease: Secondary | ICD-10-CM | POA: Diagnosis not present

## 2019-07-01 DIAGNOSIS — Z992 Dependence on renal dialysis: Secondary | ICD-10-CM | POA: Diagnosis not present

## 2019-07-01 DIAGNOSIS — N186 End stage renal disease: Secondary | ICD-10-CM | POA: Diagnosis not present

## 2019-07-01 DIAGNOSIS — E1122 Type 2 diabetes mellitus with diabetic chronic kidney disease: Secondary | ICD-10-CM | POA: Diagnosis not present

## 2019-07-01 DIAGNOSIS — N2581 Secondary hyperparathyroidism of renal origin: Secondary | ICD-10-CM | POA: Diagnosis not present

## 2019-07-01 DIAGNOSIS — D631 Anemia in chronic kidney disease: Secondary | ICD-10-CM | POA: Diagnosis not present

## 2019-07-01 DIAGNOSIS — D509 Iron deficiency anemia, unspecified: Secondary | ICD-10-CM | POA: Diagnosis not present

## 2019-07-02 DIAGNOSIS — E1165 Type 2 diabetes mellitus with hyperglycemia: Secondary | ICD-10-CM | POA: Diagnosis not present

## 2019-07-02 DIAGNOSIS — L97523 Non-pressure chronic ulcer of other part of left foot with necrosis of muscle: Secondary | ICD-10-CM | POA: Diagnosis not present

## 2019-07-02 DIAGNOSIS — M86672 Other chronic osteomyelitis, left ankle and foot: Secondary | ICD-10-CM | POA: Diagnosis not present

## 2019-07-02 DIAGNOSIS — E1122 Type 2 diabetes mellitus with diabetic chronic kidney disease: Secondary | ICD-10-CM | POA: Diagnosis not present

## 2019-07-02 DIAGNOSIS — T8131XD Disruption of external operation (surgical) wound, not elsewhere classified, subsequent encounter: Secondary | ICD-10-CM | POA: Diagnosis not present

## 2019-07-02 DIAGNOSIS — L97522 Non-pressure chronic ulcer of other part of left foot with fat layer exposed: Secondary | ICD-10-CM | POA: Diagnosis not present

## 2019-07-02 DIAGNOSIS — L97425 Non-pressure chronic ulcer of left heel and midfoot with muscle involvement without evidence of necrosis: Secondary | ICD-10-CM | POA: Diagnosis not present

## 2019-07-02 DIAGNOSIS — N186 End stage renal disease: Secondary | ICD-10-CM | POA: Diagnosis not present

## 2019-07-02 DIAGNOSIS — E1140 Type 2 diabetes mellitus with diabetic neuropathy, unspecified: Secondary | ICD-10-CM | POA: Diagnosis not present

## 2019-07-02 DIAGNOSIS — E11621 Type 2 diabetes mellitus with foot ulcer: Secondary | ICD-10-CM | POA: Diagnosis not present

## 2019-07-03 DIAGNOSIS — E1122 Type 2 diabetes mellitus with diabetic chronic kidney disease: Secondary | ICD-10-CM | POA: Diagnosis not present

## 2019-07-03 DIAGNOSIS — D631 Anemia in chronic kidney disease: Secondary | ICD-10-CM | POA: Diagnosis not present

## 2019-07-03 DIAGNOSIS — Z992 Dependence on renal dialysis: Secondary | ICD-10-CM | POA: Diagnosis not present

## 2019-07-03 DIAGNOSIS — D509 Iron deficiency anemia, unspecified: Secondary | ICD-10-CM | POA: Diagnosis not present

## 2019-07-03 DIAGNOSIS — N186 End stage renal disease: Secondary | ICD-10-CM | POA: Diagnosis not present

## 2019-07-03 DIAGNOSIS — N2581 Secondary hyperparathyroidism of renal origin: Secondary | ICD-10-CM | POA: Diagnosis not present

## 2019-07-04 DIAGNOSIS — E1122 Type 2 diabetes mellitus with diabetic chronic kidney disease: Secondary | ICD-10-CM | POA: Diagnosis not present

## 2019-07-04 DIAGNOSIS — M86672 Other chronic osteomyelitis, left ankle and foot: Secondary | ICD-10-CM | POA: Diagnosis not present

## 2019-07-04 DIAGNOSIS — E1165 Type 2 diabetes mellitus with hyperglycemia: Secondary | ICD-10-CM | POA: Diagnosis not present

## 2019-07-04 DIAGNOSIS — L97522 Non-pressure chronic ulcer of other part of left foot with fat layer exposed: Secondary | ICD-10-CM | POA: Diagnosis not present

## 2019-07-04 DIAGNOSIS — E1140 Type 2 diabetes mellitus with diabetic neuropathy, unspecified: Secondary | ICD-10-CM | POA: Diagnosis not present

## 2019-07-04 DIAGNOSIS — E11621 Type 2 diabetes mellitus with foot ulcer: Secondary | ICD-10-CM | POA: Diagnosis not present

## 2019-07-04 DIAGNOSIS — L97425 Non-pressure chronic ulcer of left heel and midfoot with muscle involvement without evidence of necrosis: Secondary | ICD-10-CM | POA: Diagnosis not present

## 2019-07-05 DIAGNOSIS — I132 Hypertensive heart and chronic kidney disease with heart failure and with stage 5 chronic kidney disease, or end stage renal disease: Secondary | ICD-10-CM | POA: Diagnosis not present

## 2019-07-05 DIAGNOSIS — E1129 Type 2 diabetes mellitus with other diabetic kidney complication: Secondary | ICD-10-CM | POA: Diagnosis not present

## 2019-07-05 DIAGNOSIS — E1140 Type 2 diabetes mellitus with diabetic neuropathy, unspecified: Secondary | ICD-10-CM | POA: Diagnosis not present

## 2019-07-05 DIAGNOSIS — Z992 Dependence on renal dialysis: Secondary | ICD-10-CM | POA: Diagnosis not present

## 2019-07-05 DIAGNOSIS — N186 End stage renal disease: Secondary | ICD-10-CM | POA: Diagnosis not present

## 2019-07-05 DIAGNOSIS — N2581 Secondary hyperparathyroidism of renal origin: Secondary | ICD-10-CM | POA: Diagnosis not present

## 2019-07-05 DIAGNOSIS — E1169 Type 2 diabetes mellitus with other specified complication: Secondary | ICD-10-CM | POA: Diagnosis not present

## 2019-07-05 DIAGNOSIS — Z4781 Encounter for orthopedic aftercare following surgical amputation: Secondary | ICD-10-CM | POA: Diagnosis not present

## 2019-07-05 DIAGNOSIS — E1165 Type 2 diabetes mellitus with hyperglycemia: Secondary | ICD-10-CM | POA: Diagnosis not present

## 2019-07-05 DIAGNOSIS — E1122 Type 2 diabetes mellitus with diabetic chronic kidney disease: Secondary | ICD-10-CM | POA: Diagnosis not present

## 2019-07-05 DIAGNOSIS — M86172 Other acute osteomyelitis, left ankle and foot: Secondary | ICD-10-CM | POA: Diagnosis not present

## 2019-07-05 DIAGNOSIS — L97529 Non-pressure chronic ulcer of other part of left foot with unspecified severity: Secondary | ICD-10-CM | POA: Diagnosis not present

## 2019-07-05 DIAGNOSIS — R569 Unspecified convulsions: Secondary | ICD-10-CM | POA: Diagnosis not present

## 2019-07-05 DIAGNOSIS — D509 Iron deficiency anemia, unspecified: Secondary | ICD-10-CM | POA: Diagnosis not present

## 2019-07-05 DIAGNOSIS — L03116 Cellulitis of left lower limb: Secondary | ICD-10-CM | POA: Diagnosis not present

## 2019-07-05 DIAGNOSIS — M86672 Other chronic osteomyelitis, left ankle and foot: Secondary | ICD-10-CM | POA: Diagnosis not present

## 2019-07-05 DIAGNOSIS — I739 Peripheral vascular disease, unspecified: Secondary | ICD-10-CM | POA: Diagnosis not present

## 2019-07-05 DIAGNOSIS — L97521 Non-pressure chronic ulcer of other part of left foot limited to breakdown of skin: Secondary | ICD-10-CM | POA: Diagnosis not present

## 2019-07-05 DIAGNOSIS — I509 Heart failure, unspecified: Secondary | ICD-10-CM | POA: Diagnosis not present

## 2019-07-05 DIAGNOSIS — D631 Anemia in chronic kidney disease: Secondary | ICD-10-CM | POA: Diagnosis not present

## 2019-07-05 DIAGNOSIS — L97522 Non-pressure chronic ulcer of other part of left foot with fat layer exposed: Secondary | ICD-10-CM | POA: Diagnosis not present

## 2019-07-05 DIAGNOSIS — E11621 Type 2 diabetes mellitus with foot ulcer: Secondary | ICD-10-CM | POA: Diagnosis not present

## 2019-07-06 DIAGNOSIS — Z992 Dependence on renal dialysis: Secondary | ICD-10-CM | POA: Diagnosis not present

## 2019-07-06 DIAGNOSIS — E1129 Type 2 diabetes mellitus with other diabetic kidney complication: Secondary | ICD-10-CM | POA: Diagnosis not present

## 2019-07-06 DIAGNOSIS — N186 End stage renal disease: Secondary | ICD-10-CM | POA: Diagnosis not present

## 2019-07-08 DIAGNOSIS — N2581 Secondary hyperparathyroidism of renal origin: Secondary | ICD-10-CM | POA: Diagnosis not present

## 2019-07-08 DIAGNOSIS — Z992 Dependence on renal dialysis: Secondary | ICD-10-CM | POA: Diagnosis not present

## 2019-07-08 DIAGNOSIS — D509 Iron deficiency anemia, unspecified: Secondary | ICD-10-CM | POA: Diagnosis not present

## 2019-07-08 DIAGNOSIS — N186 End stage renal disease: Secondary | ICD-10-CM | POA: Diagnosis not present

## 2019-07-09 DIAGNOSIS — Z794 Long term (current) use of insulin: Secondary | ICD-10-CM | POA: Diagnosis not present

## 2019-07-09 DIAGNOSIS — N186 End stage renal disease: Secondary | ICD-10-CM | POA: Diagnosis not present

## 2019-07-09 DIAGNOSIS — L97523 Non-pressure chronic ulcer of other part of left foot with necrosis of muscle: Secondary | ICD-10-CM | POA: Diagnosis not present

## 2019-07-09 DIAGNOSIS — R569 Unspecified convulsions: Secondary | ICD-10-CM | POA: Diagnosis not present

## 2019-07-09 DIAGNOSIS — E1165 Type 2 diabetes mellitus with hyperglycemia: Secondary | ICD-10-CM | POA: Diagnosis not present

## 2019-07-09 DIAGNOSIS — E1140 Type 2 diabetes mellitus with diabetic neuropathy, unspecified: Secondary | ICD-10-CM | POA: Diagnosis not present

## 2019-07-09 DIAGNOSIS — I739 Peripheral vascular disease, unspecified: Secondary | ICD-10-CM | POA: Diagnosis not present

## 2019-07-09 DIAGNOSIS — E1122 Type 2 diabetes mellitus with diabetic chronic kidney disease: Secondary | ICD-10-CM | POA: Diagnosis not present

## 2019-07-09 DIAGNOSIS — T8131XD Disruption of external operation (surgical) wound, not elsewhere classified, subsequent encounter: Secondary | ICD-10-CM | POA: Diagnosis not present

## 2019-07-09 DIAGNOSIS — Z89422 Acquired absence of other left toe(s): Secondary | ICD-10-CM | POA: Diagnosis not present

## 2019-07-09 DIAGNOSIS — E11621 Type 2 diabetes mellitus with foot ulcer: Secondary | ICD-10-CM | POA: Diagnosis not present

## 2019-07-09 DIAGNOSIS — I509 Heart failure, unspecified: Secondary | ICD-10-CM | POA: Diagnosis not present

## 2019-07-09 DIAGNOSIS — Z992 Dependence on renal dialysis: Secondary | ICD-10-CM | POA: Diagnosis not present

## 2019-07-09 DIAGNOSIS — T8189XD Other complications of procedures, not elsewhere classified, subsequent encounter: Secondary | ICD-10-CM | POA: Diagnosis not present

## 2019-07-10 DIAGNOSIS — D509 Iron deficiency anemia, unspecified: Secondary | ICD-10-CM | POA: Diagnosis not present

## 2019-07-10 DIAGNOSIS — N186 End stage renal disease: Secondary | ICD-10-CM | POA: Diagnosis not present

## 2019-07-10 DIAGNOSIS — N2581 Secondary hyperparathyroidism of renal origin: Secondary | ICD-10-CM | POA: Diagnosis not present

## 2019-07-10 DIAGNOSIS — Z992 Dependence on renal dialysis: Secondary | ICD-10-CM | POA: Diagnosis not present

## 2019-07-11 DIAGNOSIS — L97523 Non-pressure chronic ulcer of other part of left foot with necrosis of muscle: Secondary | ICD-10-CM | POA: Diagnosis not present

## 2019-07-11 DIAGNOSIS — E1122 Type 2 diabetes mellitus with diabetic chronic kidney disease: Secondary | ICD-10-CM | POA: Diagnosis not present

## 2019-07-11 DIAGNOSIS — E11621 Type 2 diabetes mellitus with foot ulcer: Secondary | ICD-10-CM | POA: Diagnosis not present

## 2019-07-11 DIAGNOSIS — E1140 Type 2 diabetes mellitus with diabetic neuropathy, unspecified: Secondary | ICD-10-CM | POA: Diagnosis not present

## 2019-07-11 DIAGNOSIS — E1165 Type 2 diabetes mellitus with hyperglycemia: Secondary | ICD-10-CM | POA: Diagnosis not present

## 2019-07-11 DIAGNOSIS — L97526 Non-pressure chronic ulcer of other part of left foot with bone involvement without evidence of necrosis: Secondary | ICD-10-CM | POA: Diagnosis not present

## 2019-07-11 DIAGNOSIS — N186 End stage renal disease: Secondary | ICD-10-CM | POA: Diagnosis not present

## 2019-07-12 DIAGNOSIS — N186 End stage renal disease: Secondary | ICD-10-CM | POA: Diagnosis not present

## 2019-07-12 DIAGNOSIS — N2581 Secondary hyperparathyroidism of renal origin: Secondary | ICD-10-CM | POA: Diagnosis not present

## 2019-07-12 DIAGNOSIS — Z992 Dependence on renal dialysis: Secondary | ICD-10-CM | POA: Diagnosis not present

## 2019-07-12 DIAGNOSIS — D509 Iron deficiency anemia, unspecified: Secondary | ICD-10-CM | POA: Diagnosis not present

## 2019-07-13 DIAGNOSIS — Z4781 Encounter for orthopedic aftercare following surgical amputation: Secondary | ICD-10-CM | POA: Diagnosis not present

## 2019-07-13 DIAGNOSIS — M86172 Other acute osteomyelitis, left ankle and foot: Secondary | ICD-10-CM | POA: Diagnosis not present

## 2019-07-13 DIAGNOSIS — E1169 Type 2 diabetes mellitus with other specified complication: Secondary | ICD-10-CM | POA: Diagnosis not present

## 2019-07-13 DIAGNOSIS — L03116 Cellulitis of left lower limb: Secondary | ICD-10-CM | POA: Diagnosis not present

## 2019-07-13 DIAGNOSIS — I509 Heart failure, unspecified: Secondary | ICD-10-CM | POA: Diagnosis not present

## 2019-07-13 DIAGNOSIS — I132 Hypertensive heart and chronic kidney disease with heart failure and with stage 5 chronic kidney disease, or end stage renal disease: Secondary | ICD-10-CM | POA: Diagnosis not present

## 2019-07-14 DIAGNOSIS — Z992 Dependence on renal dialysis: Secondary | ICD-10-CM | POA: Diagnosis not present

## 2019-07-14 DIAGNOSIS — N186 End stage renal disease: Secondary | ICD-10-CM | POA: Diagnosis not present

## 2019-07-14 DIAGNOSIS — D509 Iron deficiency anemia, unspecified: Secondary | ICD-10-CM | POA: Diagnosis not present

## 2019-07-14 DIAGNOSIS — N2581 Secondary hyperparathyroidism of renal origin: Secondary | ICD-10-CM | POA: Diagnosis not present

## 2019-07-16 DIAGNOSIS — R569 Unspecified convulsions: Secondary | ICD-10-CM | POA: Diagnosis not present

## 2019-07-16 DIAGNOSIS — I509 Heart failure, unspecified: Secondary | ICD-10-CM | POA: Diagnosis not present

## 2019-07-16 DIAGNOSIS — E11621 Type 2 diabetes mellitus with foot ulcer: Secondary | ICD-10-CM | POA: Diagnosis not present

## 2019-07-16 DIAGNOSIS — Z992 Dependence on renal dialysis: Secondary | ICD-10-CM | POA: Diagnosis not present

## 2019-07-16 DIAGNOSIS — T8189XD Other complications of procedures, not elsewhere classified, subsequent encounter: Secondary | ICD-10-CM | POA: Diagnosis not present

## 2019-07-16 DIAGNOSIS — N186 End stage renal disease: Secondary | ICD-10-CM | POA: Diagnosis not present

## 2019-07-16 DIAGNOSIS — E1140 Type 2 diabetes mellitus with diabetic neuropathy, unspecified: Secondary | ICD-10-CM | POA: Diagnosis not present

## 2019-07-16 DIAGNOSIS — I739 Peripheral vascular disease, unspecified: Secondary | ICD-10-CM | POA: Diagnosis not present

## 2019-07-16 DIAGNOSIS — E1165 Type 2 diabetes mellitus with hyperglycemia: Secondary | ICD-10-CM | POA: Diagnosis not present

## 2019-07-16 DIAGNOSIS — E1122 Type 2 diabetes mellitus with diabetic chronic kidney disease: Secondary | ICD-10-CM | POA: Diagnosis not present

## 2019-07-16 DIAGNOSIS — L97523 Non-pressure chronic ulcer of other part of left foot with necrosis of muscle: Secondary | ICD-10-CM | POA: Diagnosis not present

## 2019-07-17 DIAGNOSIS — D509 Iron deficiency anemia, unspecified: Secondary | ICD-10-CM | POA: Diagnosis not present

## 2019-07-17 DIAGNOSIS — Z992 Dependence on renal dialysis: Secondary | ICD-10-CM | POA: Diagnosis not present

## 2019-07-17 DIAGNOSIS — N2581 Secondary hyperparathyroidism of renal origin: Secondary | ICD-10-CM | POA: Diagnosis not present

## 2019-07-17 DIAGNOSIS — N186 End stage renal disease: Secondary | ICD-10-CM | POA: Diagnosis not present

## 2019-07-18 DIAGNOSIS — E1165 Type 2 diabetes mellitus with hyperglycemia: Secondary | ICD-10-CM | POA: Diagnosis not present

## 2019-07-18 DIAGNOSIS — E11621 Type 2 diabetes mellitus with foot ulcer: Secondary | ICD-10-CM | POA: Diagnosis not present

## 2019-07-18 DIAGNOSIS — Z992 Dependence on renal dialysis: Secondary | ICD-10-CM | POA: Diagnosis not present

## 2019-07-18 DIAGNOSIS — E1122 Type 2 diabetes mellitus with diabetic chronic kidney disease: Secondary | ICD-10-CM | POA: Diagnosis not present

## 2019-07-18 DIAGNOSIS — L97523 Non-pressure chronic ulcer of other part of left foot with necrosis of muscle: Secondary | ICD-10-CM | POA: Diagnosis not present

## 2019-07-18 DIAGNOSIS — L97811 Non-pressure chronic ulcer of other part of right lower leg limited to breakdown of skin: Secondary | ICD-10-CM | POA: Diagnosis not present

## 2019-07-18 DIAGNOSIS — N186 End stage renal disease: Secondary | ICD-10-CM | POA: Diagnosis not present

## 2019-07-18 DIAGNOSIS — E1140 Type 2 diabetes mellitus with diabetic neuropathy, unspecified: Secondary | ICD-10-CM | POA: Diagnosis not present

## 2019-07-18 DIAGNOSIS — L97911 Non-pressure chronic ulcer of unspecified part of right lower leg limited to breakdown of skin: Secondary | ICD-10-CM | POA: Diagnosis not present

## 2019-07-19 DIAGNOSIS — Z992 Dependence on renal dialysis: Secondary | ICD-10-CM | POA: Diagnosis not present

## 2019-07-19 DIAGNOSIS — D509 Iron deficiency anemia, unspecified: Secondary | ICD-10-CM | POA: Diagnosis not present

## 2019-07-19 DIAGNOSIS — N2581 Secondary hyperparathyroidism of renal origin: Secondary | ICD-10-CM | POA: Diagnosis not present

## 2019-07-19 DIAGNOSIS — N186 End stage renal disease: Secondary | ICD-10-CM | POA: Diagnosis not present

## 2019-07-20 DIAGNOSIS — R569 Unspecified convulsions: Secondary | ICD-10-CM | POA: Diagnosis not present

## 2019-07-20 DIAGNOSIS — T82858A Stenosis of vascular prosthetic devices, implants and grafts, initial encounter: Secondary | ICD-10-CM | POA: Diagnosis not present

## 2019-07-20 DIAGNOSIS — E1122 Type 2 diabetes mellitus with diabetic chronic kidney disease: Secondary | ICD-10-CM | POA: Diagnosis not present

## 2019-07-20 DIAGNOSIS — E1140 Type 2 diabetes mellitus with diabetic neuropathy, unspecified: Secondary | ICD-10-CM | POA: Diagnosis not present

## 2019-07-20 DIAGNOSIS — L97523 Non-pressure chronic ulcer of other part of left foot with necrosis of muscle: Secondary | ICD-10-CM | POA: Diagnosis not present

## 2019-07-20 DIAGNOSIS — N186 End stage renal disease: Secondary | ICD-10-CM | POA: Diagnosis not present

## 2019-07-20 DIAGNOSIS — E11621 Type 2 diabetes mellitus with foot ulcer: Secondary | ICD-10-CM | POA: Diagnosis not present

## 2019-07-20 DIAGNOSIS — Z992 Dependence on renal dialysis: Secondary | ICD-10-CM | POA: Diagnosis not present

## 2019-07-20 DIAGNOSIS — I739 Peripheral vascular disease, unspecified: Secondary | ICD-10-CM | POA: Diagnosis not present

## 2019-07-20 DIAGNOSIS — E1165 Type 2 diabetes mellitus with hyperglycemia: Secondary | ICD-10-CM | POA: Diagnosis not present

## 2019-07-20 DIAGNOSIS — L97528 Non-pressure chronic ulcer of other part of left foot with other specified severity: Secondary | ICD-10-CM | POA: Diagnosis not present

## 2019-07-20 DIAGNOSIS — I871 Compression of vein: Secondary | ICD-10-CM | POA: Diagnosis not present

## 2019-07-20 DIAGNOSIS — I509 Heart failure, unspecified: Secondary | ICD-10-CM | POA: Diagnosis not present

## 2019-07-21 DIAGNOSIS — N2581 Secondary hyperparathyroidism of renal origin: Secondary | ICD-10-CM | POA: Diagnosis not present

## 2019-07-21 DIAGNOSIS — N186 End stage renal disease: Secondary | ICD-10-CM | POA: Diagnosis not present

## 2019-07-21 DIAGNOSIS — D509 Iron deficiency anemia, unspecified: Secondary | ICD-10-CM | POA: Diagnosis not present

## 2019-07-21 DIAGNOSIS — Z992 Dependence on renal dialysis: Secondary | ICD-10-CM | POA: Diagnosis not present

## 2019-07-23 ENCOUNTER — Telehealth: Payer: Self-pay | Admitting: Family Medicine

## 2019-07-23 DIAGNOSIS — E1165 Type 2 diabetes mellitus with hyperglycemia: Secondary | ICD-10-CM | POA: Diagnosis not present

## 2019-07-23 DIAGNOSIS — E1142 Type 2 diabetes mellitus with diabetic polyneuropathy: Secondary | ICD-10-CM | POA: Diagnosis not present

## 2019-07-23 DIAGNOSIS — R404 Transient alteration of awareness: Secondary | ICD-10-CM | POA: Diagnosis not present

## 2019-07-23 DIAGNOSIS — I469 Cardiac arrest, cause unspecified: Secondary | ICD-10-CM | POA: Diagnosis not present

## 2019-07-23 DIAGNOSIS — M86172 Other acute osteomyelitis, left ankle and foot: Secondary | ICD-10-CM | POA: Diagnosis not present

## 2019-07-23 DIAGNOSIS — Z4781 Encounter for orthopedic aftercare following surgical amputation: Secondary | ICD-10-CM | POA: Diagnosis not present

## 2019-07-23 DIAGNOSIS — L03116 Cellulitis of left lower limb: Secondary | ICD-10-CM | POA: Diagnosis not present

## 2019-07-23 DIAGNOSIS — R4020 Unspecified coma: Secondary | ICD-10-CM | POA: Diagnosis not present

## 2019-07-23 DIAGNOSIS — R0902 Hypoxemia: Secondary | ICD-10-CM | POA: Diagnosis not present

## 2019-07-23 DIAGNOSIS — I509 Heart failure, unspecified: Secondary | ICD-10-CM | POA: Diagnosis not present

## 2019-07-23 DIAGNOSIS — I132 Hypertensive heart and chronic kidney disease with heart failure and with stage 5 chronic kidney disease, or end stage renal disease: Secondary | ICD-10-CM | POA: Diagnosis not present

## 2019-07-23 DIAGNOSIS — I499 Cardiac arrhythmia, unspecified: Secondary | ICD-10-CM | POA: Diagnosis not present

## 2019-07-23 DIAGNOSIS — E1169 Type 2 diabetes mellitus with other specified complication: Secondary | ICD-10-CM | POA: Diagnosis not present

## 2019-07-28 DIAGNOSIS — G40909 Epilepsy, unspecified, not intractable, without status epilepticus: Secondary | ICD-10-CM | POA: Diagnosis not present

## 2019-07-28 DIAGNOSIS — J984 Other disorders of lung: Secondary | ICD-10-CM | POA: Diagnosis not present

## 2019-07-28 DIAGNOSIS — I70209 Unspecified atherosclerosis of native arteries of extremities, unspecified extremity: Secondary | ICD-10-CM | POA: Diagnosis not present

## 2019-07-28 DIAGNOSIS — I509 Heart failure, unspecified: Secondary | ICD-10-CM | POA: Diagnosis not present

## 2019-07-28 DIAGNOSIS — I272 Pulmonary hypertension, unspecified: Secondary | ICD-10-CM | POA: Diagnosis not present

## 2019-07-28 DIAGNOSIS — Z7982 Long term (current) use of aspirin: Secondary | ICD-10-CM | POA: Diagnosis not present

## 2019-07-28 DIAGNOSIS — E1136 Type 2 diabetes mellitus with diabetic cataract: Secondary | ICD-10-CM | POA: Diagnosis not present

## 2019-07-28 DIAGNOSIS — Z4781 Encounter for orthopedic aftercare following surgical amputation: Secondary | ICD-10-CM | POA: Diagnosis not present

## 2019-07-28 DIAGNOSIS — N186 End stage renal disease: Secondary | ICD-10-CM | POA: Diagnosis not present

## 2019-07-28 DIAGNOSIS — E1151 Type 2 diabetes mellitus with diabetic peripheral angiopathy without gangrene: Secondary | ICD-10-CM | POA: Diagnosis not present

## 2019-07-28 DIAGNOSIS — I132 Hypertensive heart and chronic kidney disease with heart failure and with stage 5 chronic kidney disease, or end stage renal disease: Secondary | ICD-10-CM | POA: Diagnosis not present

## 2019-07-28 DIAGNOSIS — E1169 Type 2 diabetes mellitus with other specified complication: Secondary | ICD-10-CM | POA: Diagnosis not present

## 2019-07-28 DIAGNOSIS — E1142 Type 2 diabetes mellitus with diabetic polyneuropathy: Secondary | ICD-10-CM | POA: Diagnosis not present

## 2019-07-28 DIAGNOSIS — M86172 Other acute osteomyelitis, left ankle and foot: Secondary | ICD-10-CM | POA: Diagnosis not present

## 2019-07-28 DIAGNOSIS — Z89422 Acquired absence of other left toe(s): Secondary | ICD-10-CM | POA: Diagnosis not present

## 2019-07-28 DIAGNOSIS — Z794 Long term (current) use of insulin: Secondary | ICD-10-CM | POA: Diagnosis not present

## 2019-07-28 DIAGNOSIS — E1122 Type 2 diabetes mellitus with diabetic chronic kidney disease: Secondary | ICD-10-CM | POA: Diagnosis not present

## 2019-07-28 DIAGNOSIS — D631 Anemia in chronic kidney disease: Secondary | ICD-10-CM | POA: Diagnosis not present

## 2019-07-28 DIAGNOSIS — Z992 Dependence on renal dialysis: Secondary | ICD-10-CM | POA: Diagnosis not present

## 2019-07-28 DIAGNOSIS — L03116 Cellulitis of left lower limb: Secondary | ICD-10-CM | POA: Diagnosis not present

## 2019-07-28 DIAGNOSIS — Z8614 Personal history of Methicillin resistant Staphylococcus aureus infection: Secondary | ICD-10-CM | POA: Diagnosis not present

## 2019-07-28 DIAGNOSIS — K219 Gastro-esophageal reflux disease without esophagitis: Secondary | ICD-10-CM | POA: Diagnosis not present

## 2019-07-28 DIAGNOSIS — Z9181 History of falling: Secondary | ICD-10-CM | POA: Diagnosis not present

## 2019-08-01 ENCOUNTER — Telehealth: Payer: Self-pay | Admitting: Family Medicine

## 2019-08-01 NOTE — Telephone Encounter (Signed)
Received death cert- I last saw this pt in 01/2018 Called his wife to express my condolences and get more information  She reports that on day of death they were at home together- he woke up feeling cold and then vomited, and then he stopped breathing.  EMS came and transported him to the ER at [facility] but he did not survive They plan to do a cremation at time of funeral this coming Saturday- not done yet. They did not do an autopsy   His nephrologist was Dr Augustin Coupe with the [facility] [phone]  He has history of seizure, recent amputation- otherwise according to charge nurse at his dialysis center, he had been doing relatively well  I was able to review the emergency room documentation from day of death, 2019/07/27 The patient received CPR, was called coded multiple times.  He lost his pulse one epi drip, was pronounced dead.  Per ER final diagnoses cardiac arrest  I will complete death certificate and return to funeral home

## 2019-08-01 NOTE — Telephone Encounter (Signed)
Certificate given to provider.

## 2019-08-01 NOTE — Telephone Encounter (Signed)
Death Certificate dropped off to be filled out by provider ([place] 1 Page) Please call at [phone] when ready to pick up. Document given to CMA.

## 2019-08-06 NOTE — Telephone Encounter (Signed)
Received call from emergency department at med center that this patient has expired.  He apparently arrested at home, EMS attempted to revive him-the ER was unable to get a pulse back.  He died at 10:42 this morning in the emergency department At this time the cause of death is uncertain-he was sick, with a fever.  Otherwise I do not have details at this time

## 2019-08-06 DEATH — deceased
